# Patient Record
Sex: Female | Born: 1955
Health system: Southern US, Community
[De-identification: ages and names within clinical notes are randomized; demographics above are authoritative.]

## PROBLEM LIST (undated history)

## (undated) DIAGNOSIS — Z800 Family history of malignant neoplasm of digestive organs: Secondary | ICD-10-CM

## (undated) DIAGNOSIS — C189 Malignant neoplasm of colon, unspecified: Secondary | ICD-10-CM

## (undated) DIAGNOSIS — E119 Type 2 diabetes mellitus without complications: Secondary | ICD-10-CM

## (undated) DIAGNOSIS — R0683 Snoring: Secondary | ICD-10-CM

## (undated) DIAGNOSIS — I10 Essential (primary) hypertension: Secondary | ICD-10-CM

## (undated) DIAGNOSIS — C787 Secondary malignant neoplasm of liver and intrahepatic bile duct: Principal | ICD-10-CM

## (undated) DIAGNOSIS — E785 Hyperlipidemia, unspecified: Secondary | ICD-10-CM

## (undated) DIAGNOSIS — Z973 Presence of spectacles and contact lenses: Secondary | ICD-10-CM

## (undated) DIAGNOSIS — M549 Dorsalgia, unspecified: Secondary | ICD-10-CM

## (undated) HISTORY — DX: Family history of malignant neoplasm of digestive organs: Z80.0

## (undated) HISTORY — DX: Presence of spectacles and contact lenses: Z97.3

## (undated) HISTORY — DX: Secondary malignant neoplasm of liver and intrahepatic bile duct: C78.7

## (undated) HISTORY — DX: Snoring: R06.83

## (undated) HISTORY — PX: CATARACT EXTRACTION: SUR2

## (undated) HISTORY — PX: MOUTH SURGERY: SHX715

## (undated) HISTORY — DX: Dorsalgia, unspecified: M54.9

## (undated) HISTORY — DX: Malignant neoplasm of colon, unspecified: C18.9

---

## 2007-03-18 ENCOUNTER — Ambulatory Visit (HOSPITAL_COMMUNITY): Admission: RE | Admit: 2007-03-18 | Discharge: 2007-03-18 | Payer: Self-pay | Admitting: Ophthalmology

## 2008-05-18 ENCOUNTER — Ambulatory Visit (HOSPITAL_COMMUNITY): Admission: RE | Admit: 2008-05-18 | Discharge: 2008-05-18 | Payer: Self-pay | Admitting: Ophthalmology

## 2008-05-24 ENCOUNTER — Ambulatory Visit (HOSPITAL_COMMUNITY): Admission: RE | Admit: 2008-05-24 | Discharge: 2008-05-24 | Payer: Self-pay | Admitting: Ophthalmology

## 2010-04-30 LAB — GLUCOSE, CAPILLARY: Glucose-Capillary: 150 mg/dL — ABNORMAL HIGH (ref 70–99)

## 2010-05-01 LAB — GLUCOSE, CAPILLARY: Glucose-Capillary: 144 mg/dL — ABNORMAL HIGH (ref 70–99)

## 2010-05-01 LAB — HEMOGLOBIN AND HEMATOCRIT, BLOOD
HCT: 33.7 % — ABNORMAL LOW (ref 36.0–46.0)
Hemoglobin: 11.4 g/dL — ABNORMAL LOW (ref 12.0–15.0)

## 2010-05-01 LAB — BASIC METABOLIC PANEL
CO2: 32 mEq/L (ref 19–32)
Calcium: 9.4 mg/dL (ref 8.4–10.5)
Creatinine, Ser: 0.45 mg/dL (ref 0.4–1.2)
GFR calc Af Amer: >60 mL/min (ref >60)
GFR calc non Af Amer: >60 mL/min (ref >60)
Sodium: 139 mEq/L (ref 135–145)

## 2010-10-11 LAB — BASIC METABOLIC PANEL
BUN: 8
Calcium: 9.4
Creatinine, Ser: 0.5
GFR calc non Af Amer: >60
Glucose, Bld: 364 — ABNORMAL HIGH
Potassium: 4.3

## 2010-10-11 LAB — HCG, QUANTITATIVE, PREGNANCY: hCG, Beta Chain, Quant, S: <2

## 2014-05-21 ENCOUNTER — Emergency Department (HOSPITAL_COMMUNITY)
Admission: EM | Admit: 2014-05-21 | Discharge: 2014-05-21 | Disposition: A | Discharge status: Home / Self Care | Payer: BLUE CROSS/BLUE SHIELD | Attending: Emergency Medicine | Admitting: Emergency Medicine

## 2014-05-21 ENCOUNTER — Encounter (HOSPITAL_COMMUNITY): Payer: Self-pay | Admitting: *Deleted

## 2014-05-21 DIAGNOSIS — R519 Headache, unspecified: Secondary | ICD-10-CM

## 2014-05-21 DIAGNOSIS — R7401 Elevation of levels of liver transaminase levels: Secondary | ICD-10-CM

## 2014-05-21 DIAGNOSIS — Z79899 Other long term (current) drug therapy: Secondary | ICD-10-CM | POA: Insufficient documentation

## 2014-05-21 DIAGNOSIS — R112 Nausea with vomiting, unspecified: Secondary | ICD-10-CM | POA: Diagnosis not present

## 2014-05-21 DIAGNOSIS — D649 Anemia, unspecified: Secondary | ICD-10-CM | POA: Diagnosis not present

## 2014-05-21 DIAGNOSIS — R740 Nonspecific elevation of levels of transaminase and lactic acid dehydrogenase [LDH]: Secondary | ICD-10-CM | POA: Diagnosis not present

## 2014-05-21 DIAGNOSIS — Z794 Long term (current) use of insulin: Secondary | ICD-10-CM | POA: Insufficient documentation

## 2014-05-21 DIAGNOSIS — I10 Essential (primary) hypertension: Secondary | ICD-10-CM | POA: Insufficient documentation

## 2014-05-21 DIAGNOSIS — R509 Fever, unspecified: Secondary | ICD-10-CM | POA: Diagnosis not present

## 2014-05-21 DIAGNOSIS — E119 Type 2 diabetes mellitus without complications: Secondary | ICD-10-CM | POA: Insufficient documentation

## 2014-05-21 DIAGNOSIS — R51 Headache: Secondary | ICD-10-CM | POA: Insufficient documentation

## 2014-05-21 HISTORY — DX: Hyperlipidemia, unspecified: E78.5

## 2014-05-21 HISTORY — DX: Essential (primary) hypertension: I10

## 2014-05-21 HISTORY — DX: Type 2 diabetes mellitus without complications: E11.9

## 2014-05-21 LAB — COMPREHENSIVE METABOLIC PANEL
ALT: 50 U/L (ref 14–54)
ANION GAP: 8 (ref 5–15)
AST: 59 U/L — AB (ref 15–41)
Albumin: 4.2 g/dL (ref 3.5–5.0)
Alkaline Phosphatase: 107 U/L (ref 38–126)
BILIRUBIN TOTAL: 0.5 mg/dL (ref 0.3–1.2)
BUN: 17 mg/dL (ref 6–20)
CALCIUM: 9.2 mg/dL (ref 8.9–10.3)
CHLORIDE: 103 mmol/L (ref 101–111)
CO2: 26 mmol/L (ref 22–32)
CREATININE: 1.02 mg/dL — AB (ref 0.44–1.00)
GFR, EST NON AFRICAN AMERICAN: 58 mL/min — AB (ref >60)
Glucose, Bld: 116 mg/dL — ABNORMAL HIGH (ref 70–99)
Potassium: 4.2 mmol/L (ref 3.5–5.1)
Sodium: 137 mmol/L (ref 135–145)
Total Protein: 7.8 g/dL (ref 6.5–8.1)

## 2014-05-21 LAB — CBC WITH DIFFERENTIAL/PLATELET
BASOS PCT: 0 % (ref 0–1)
Basophils Absolute: 0 10*3/uL (ref 0.0–0.1)
EOS ABS: 0.1 10*3/uL (ref 0.0–0.7)
Eosinophils Relative: 1 % (ref 0–5)
HCT: 32 % — ABNORMAL LOW (ref 36.0–46.0)
Hemoglobin: 10.1 g/dL — ABNORMAL LOW (ref 12.0–15.0)
LYMPHS ABS: 1.4 10*3/uL (ref 0.7–4.0)
LYMPHS PCT: 12 % (ref 12–46)
MCH: 26.5 pg (ref 26.0–34.0)
MCHC: 31.6 g/dL (ref 30.0–36.0)
MCV: 84 fL (ref 78.0–100.0)
MONOS PCT: 9 % (ref 3–12)
Monocytes Absolute: 1 10*3/uL (ref 0.1–1.0)
NEUTROS ABS: 9.3 10*3/uL — AB (ref 1.7–7.7)
NEUTROS PCT: 78 % — AB (ref 43–77)
PLATELETS: 220 10*3/uL (ref 150–400)
RBC: 3.81 MIL/uL — AB (ref 3.87–5.11)
RDW: 14.6 % (ref 11.5–15.5)
WBC: 12 10*3/uL — AB (ref 4.0–10.5)

## 2014-05-21 LAB — LIPASE, BLOOD: Lipase: 16 U/L — ABNORMAL LOW (ref 22–51)

## 2014-05-21 MED ORDER — METOCLOPRAMIDE HCL 5 MG/ML IJ SOLN
10.0000 mg | Freq: Once | INTRAMUSCULAR | Status: AC
  Administered 2014-05-21: 10 mg via INTRAVENOUS
  Filled 2014-05-21: qty 2

## 2014-05-21 MED ORDER — DIPHENHYDRAMINE HCL 50 MG/ML IJ SOLN
25.0000 mg | Freq: Once | INTRAMUSCULAR | Status: AC
  Administered 2014-05-21: 25 mg via INTRAVENOUS
  Filled 2014-05-21: qty 1

## 2014-05-21 MED ORDER — SODIUM CHLORIDE 0.9 % IV SOLN
1000.0000 mL | Freq: Once | INTRAVENOUS | Status: AC
  Administered 2014-05-21: 1000 mL via INTRAVENOUS

## 2014-05-21 MED ORDER — SODIUM CHLORIDE 0.9 % IV SOLN: 1000.0000 mL | INTRAVENOUS | Status: DC

## 2014-05-21 MED ORDER — METOCLOPRAMIDE HCL 10 MG PO TABS: 10.0000 mg | ORAL_TABLET | Freq: Four times a day (QID) | ORAL | Status: DC | PRN

## 2014-05-21 MED ORDER — ONDANSETRON HCL 4 MG/2ML IJ SOLN
4.0000 mg | Freq: Once | INTRAMUSCULAR | Status: DC
  Administered 2014-05-21: 4 mg via INTRAVENOUS
  Filled 2014-05-21: qty 2

## 2014-05-21 MED ORDER — ACETAMINOPHEN 325 MG PO TABS
650.0000 mg | ORAL_TABLET | Freq: Once | ORAL | Status: AC
  Administered 2014-05-21: 650 mg via ORAL
  Filled 2014-05-21: qty 2

## 2014-05-21 NOTE — ED Provider Notes (Signed)
CSN: [id number]     Arrival date & time 05/21/14  0334 History   First MD Initiated Contact with Patient 05/21/14 [phone]     Chief Complaint  Patient presents with  . Emesis     (Consider location/radiation/quality/duration/timing/severity/associated sxs/prior Treatment) Patient is a 59 y.o. female presenting with vomiting. The history is provided by the patient.  Emesis She had onset yesterday of malaise, nausea, mild headache. Nausea got worse and she started vomiting tonight. She also states that her headache got worse tonight. She was not aware of any fever but has had some chills. There have not been any sweats. She has had some intermittent pain in her left upper abdomen. There's been no constipation or diarrhea. She denies urinary urgency, frequency, tenesmus, dysuria. She denies arthralgias or myalgias. She denies any sick contacts.  Past Medical History  Diagnosis Date  . Hyperlipemia   . Diabetes mellitus without complication   . Hypertension    History reviewed. No pertinent past surgical history. No family history on file. History  Substance Use Topics  . Smoking status: Never Smoker   . Smokeless tobacco: Not on file  . Alcohol Use: No   OB History    No data available     Review of Systems  Gastrointestinal: Positive for vomiting.  All other systems reviewed and are negative.     Allergies  Review of patient's allergies indicates no known allergies.  Home Medications   Prior to Admission medications   Medication Sig Start Date End Date Taking? Authorizing Provider  insulin glargine (LANTUS) 100 UNIT/ML injection Inject 28 Units into the skin at bedtime.   Yes Historical Provider, MD  insulin lispro (HUMALOG) 100 UNIT/ML injection Inject into the skin 2 (two) times daily. Sliding scale   Yes Historical Provider, MD  losartan (COZAAR) 50 MG tablet Take 50 mg by mouth daily.   Yes Historical Provider, MD  simvastatin (ZOCOR) 80 MG tablet Take 80 mg by mouth  daily.   Yes Historical Provider, MD   BP 163/94 mmHg  Pulse 111  Temp(Src) 101.2 F (38.4 C) (Oral)  Resp 20  Ht 5\' 4"  (1.626 m)  Wt 172 lb (78.019 kg)  BMI 29.51 kg/m2  SpO2 97% Physical Exam  Nursing note and vitals reviewed.  59 year old female, resting comfortably and in no acute distress. Vital signs are significant for fever, tachycardia, hypertension. Oxygen saturation is 97%, which is normal. Head is normocephalic and atraumatic. PERRLA, EOMI. Oropharynx is clear. Neck is nontender and supple without adenopathy or JVD. Back is nontender and there is no CVA tenderness. Lungs are clear without rales, wheezes, or rhonchi. Chest is nontender. Heart has regular rate and rhythm without murmur. Abdomen is soft, flat, nontender without masses or hepatosplenomegaly and peristalsis is hypoactive. Extremities have no cyanosis or edema, full range of motion is present. Skin is warm and dry without rash. Neurologic: Mental status is normal, cranial nerves are intact, there are no motor or sensory deficits.  ED Course  Procedures (including critical care time) Labs Review Results for orders placed or performed during the hospital encounter of 05/21/14  CBC with Differential  Result Value Ref Range   WBC 12.0 (H) 4.0 - 10.5 K/uL   RBC 3.81 (L) 3.87 - 5.11 MIL/uL   Hemoglobin 10.1 (L) 12.0 - 15.0 g/dL   HCT 32.0 (L) 36.0 - 46.0 %   MCV 84.0 78.0 - 100.0 fL   MCH 26.5 26.0 - 34.0 pg   MCHC  31.6 30.0 - 36.0 g/dL   RDW 14.6 11.5 - 15.5 %   Platelets 220 150 - 400 K/uL   Neutrophils Relative % 78 (H) 43 - 77 %   Neutro Abs 9.3 (H) 1.7 - 7.7 K/uL   Lymphocytes Relative 12 12 - 46 %   Lymphs Abs 1.4 0.7 - 4.0 K/uL   Monocytes Relative 9 3 - 12 %   Monocytes Absolute 1.0 0.1 - 1.0 K/uL   Eosinophils Relative 1 0 - 5 %   Eosinophils Absolute 0.1 0.0 - 0.7 K/uL   Basophils Relative 0 0 - 1 %   Basophils Absolute 0.0 0.0 - 0.1 K/uL  Comprehensive metabolic panel  Result Value Ref  Range   Sodium 137 135 - 145 mmol/L   Potassium 4.2 3.5 - 5.1 mmol/L   Chloride 103 101 - 111 mmol/L   CO2 26 22 - 32 mmol/L   Glucose, Bld 116 (H) 70 - 99 mg/dL   BUN 17 6 - 20 mg/dL   Creatinine, Ser 1.02 (H) 0.44 - 1.00 mg/dL   Calcium 9.2 8.9 - 10.3 mg/dL   Total Protein 7.8 6.5 - 8.1 g/dL   Albumin 4.2 3.5 - 5.0 g/dL   AST 59 (H) 15 - 41 U/L   ALT 50 14 - 54 U/L   Alkaline Phosphatase 107 38 - 126 U/L   Total Bilirubin 0.5 0.3 - 1.2 mg/dL   GFR calc non Af Amer 58 (L) >60 mL/min   GFR calc Af Amer >60 >60 mL/min   Anion gap 8 5 - 15  Lipase, blood  Result Value Ref Range   Lipase 16 (L) 22 - 51 U/L   MDM   Final diagnoses:  Nausea and vomiting, vomiting of unspecified type  Fever, unspecified fever cause  Headache, unspecified headache type  Normochromic normocytic anemia  Elevated transaminase level    Fever, headache, nausea and vomiting. This is most compatible with a viral illness. Symptomatic treatment is started with IV fluids, metoclopramide, diphenhydramine, and acetaminophen.  She feels much better after above noted treatment. Temperature is down to 99.0 and heart rate has come down. She is discharged with prescription for metoclopramide and told to use acetaminophen for fever. Laboratory workup was significant for mild anemia which is unchanged from baseline, and mild elevation of AST and 59. She is advised to have this repeated as an outpatient in one-2 weeks.  Delora Fuel, MD 40/97/35 [id number]

## 2014-05-21 NOTE — ED Notes (Signed)
Pt reports she has been feeling bad for a day or so & started vomiting last night.

## 2014-05-21 NOTE — Discharge Instructions (Signed)
Take acetaminophen as needed for fever.  One test of your liver was mildly elevated. AST was 59. Please see your doctor in 1-2 weeks to repeat this test.  Nausea and Vomiting Nausea is a sick feeling that often comes before throwing up (vomiting). Vomiting is a reflex where stomach contents come out of your mouth. Vomiting can cause severe loss of body fluids (dehydration). Children and elderly adults can become dehydrated quickly, especially if they also have diarrhea. Nausea and vomiting are symptoms of a condition or disease. It is important to find the cause of your symptoms. CAUSES   Direct irritation of the stomach lining. This irritation can result from increased acid production (gastroesophageal reflux disease), infection, food poisoning, taking certain medicines (such as nonsteroidal anti-inflammatory drugs), alcohol use, or tobacco use.  Signals from the brain.These signals could be caused by a headache, heat exposure, an inner ear disturbance, increased pressure in the brain from injury, infection, a tumor, or a concussion, pain, emotional stimulus, or metabolic problems.  An obstruction in the gastrointestinal tract (bowel obstruction).  Illnesses such as diabetes, hepatitis, gallbladder problems, appendicitis, kidney problems, cancer, sepsis, atypical symptoms of a heart attack, or eating disorders.  Medical treatments such as chemotherapy and radiation.  Receiving medicine that makes you sleep (general anesthetic) during surgery. DIAGNOSIS Your caregiver may ask for tests to be done if the problems do not improve after a few days. Tests may also be done if symptoms are severe or if the reason for the nausea and vomiting is not clear. Tests may include:  Urine tests.  Blood tests.  Stool tests.  Cultures (to look for evidence of infection).  X-rays or other imaging studies. Test results can help your caregiver make decisions about treatment or the need for additional  tests. TREATMENT You need to stay well hydrated. Drink frequently but in small amounts.You may wish to drink water, sports drinks, clear broth, or eat frozen ice pops or gelatin dessert to help stay hydrated.When you eat, eating slowly may help prevent nausea.There are also some antinausea medicines that may help prevent nausea. HOME CARE INSTRUCTIONS   Take all medicine as directed by your caregiver.  If you do not have an appetite, do not force yourself to eat. However, you must continue to drink fluids.  If you have an appetite, eat a normal diet unless your caregiver tells you differently.  Eat a variety of complex carbohydrates (rice, wheat, potatoes, bread), lean meats, yogurt, fruits, and vegetables.  Avoid high-fat foods because they are more difficult to digest.  Drink enough water and fluids to keep your urine clear or pale yellow.  If you are dehydrated, ask your caregiver for specific rehydration instructions. Signs of dehydration may include:  Severe thirst.  Dry lips and mouth.  Dizziness.  Dark urine.  Decreasing urine frequency and amount.  Confusion.  Rapid breathing or pulse. SEEK IMMEDIATE MEDICAL CARE IF:   You have blood or [place] flecks (like coffee grounds) in your vomit.  You have black or bloody stools.  You have a severe headache or stiff neck.  You are confused.  You have severe abdominal pain.  You have chest pain or trouble breathing.  You do not urinate at least once every 8 hours.  You develop cold or clammy skin.  You continue to vomit for longer than 24 to 48 hours.  You have a fever. MAKE SURE YOU:   Understand these instructions.  Will watch your condition.  Will get help right  away if you are not doing well or get worse. Document Released: 01/06/2005 Document Revised: 03/31/2011 Document Reviewed: 06/05/2010 [facility] Patient Information 2015 [place], [place]. This information is not intended to replace advice given to  you by your health care provider. Make sure you discuss any questions you have with your health care provider.  General Headache Without Cause A headache is pain or discomfort felt around the head or neck area. The specific cause of a headache may not be found. There are many causes and types of headaches. A few common ones are:  Tension headaches.  Migraine headaches.  Cluster headaches.  Chronic daily headaches. HOME CARE INSTRUCTIONS   Keep all follow-up appointments with your caregiver or any specialist referral.  Only take over-the-counter or prescription medicines for pain or discomfort as directed by your caregiver.  Lie down in a dark, quiet room when you have a headache.  Keep a headache journal to find out what may trigger your migraine headaches. For example, write down:  What you eat and drink.  How much sleep you get.  Any change to your diet or medicines.  Try massage or other relaxation techniques.  Put ice packs or heat on the head and neck. Use these 3 to 4 times per day for 15 to 20 minutes each time, or as needed.  Limit stress.  Sit up straight, and do not tense your muscles.  Quit smoking if you smoke.  Limit alcohol use.  Decrease the amount of caffeine you drink, or stop drinking caffeine.  Eat and sleep on a regular schedule.  Get 7 to 9 hours of sleep, or as recommended by your caregiver.  Keep lights dim if bright lights bother you and make your headaches worse. SEEK MEDICAL CARE IF:   You have problems with the medicines you were prescribed.  Your medicines are not working.  You have a change from the usual headache.  You have nausea or vomiting. SEEK IMMEDIATE MEDICAL CARE IF:   Your headache becomes severe.  You have a fever.  You have a stiff neck.  You have loss of vision.  You have muscular weakness or loss of muscle control.  You start losing your balance or have trouble walking.  You feel faint or pass out.  You  have severe symptoms that are different from your first symptoms. MAKE SURE YOU:   Understand these instructions.  Will watch your condition.  Will get help right away if you are not doing well or get worse. Document Released: 01/06/2005 Document Revised: 03/31/2011 Document Reviewed: 01/22/2011 [facility] Patient Information 2015 [place], [place]. This information is not intended to replace advice given to you by your health care provider. Make sure you discuss any questions you have with your health care provider.  Metoclopramide tablets What is this medicine? METOCLOPRAMIDE (met oh kloe PRA mide) is used to treat the symptoms of gastroesophageal reflux disease (GERD) like heartburn. It is also used to treat people with slow emptying of the stomach and intestinal tract. This medicine may be used for other purposes; ask your health care provider or pharmacist if you have questions. COMMON BRAND NAME(S): Reglan What should I tell my health care provider before I take this medicine? They need to know if you have any of these conditions: -breast cancer -depression -diabetes -heart failure -high blood pressure -kidney disease -liver disease -Parkinson's disease or a movement disorder -pheochromocytoma -seizures -stomach obstruction, bleeding, or perforation -an unusual or allergic reaction to metoclopramide, procainamide, sulfites, other  medicines, foods, dyes, or preservatives -pregnant or trying to get pregnant -breast-feeding How should I use this medicine? Take this medicine by mouth with a glass of water. Follow the directions on the prescription label. Take this medicine on an empty stomach, about 30 minutes before eating. Take your doses at regular intervals. Do not take your medicine more often than directed. Do not stop taking except on the advice of your doctor or health care professional. A special MedGuide will be given to you by the pharmacist with each prescription and  refill. Be sure to read this information carefully each time. Talk to your pediatrician regarding the use of this medicine in children. Special care may be needed. Overdosage: If you think you have taken too much of this medicine contact a poison control center or emergency room at once. NOTE: This medicine is only for you. Do not share this medicine with others. What if I miss a dose? If you miss a dose, take it as soon as you can. If it is almost time for your next dose, take only that dose. Do not take double or extra doses. What may interact with this medicine? -acetaminophen -cyclosporine -digoxin -medicines for blood pressure -medicines for diabetes, including insulin -medicines for hay fever and other allergies -medicines for depression, especially an Monoamine Oxidase Inhibitor (MAOI) -medicines for Parkinson's disease, like levodopa -medicines for sleep or for pain -tetracycline This list may not describe all possible interactions. Give your health care provider a list of all the medicines, herbs, non-prescription drugs, or dietary supplements you use. Also tell them if you smoke, drink alcohol, or use illegal drugs. Some items may interact with your medicine. What should I watch for while using this medicine? It may take a few weeks for your stomach condition to start to get better. However, do not take this medicine for longer than 12 weeks. The longer you take this medicine, and the more you take it, the greater your chances are of developing serious side effects. If you are an elderly patient, a female patient, or you have diabetes, you may be at an increased risk for side effects from this medicine. Contact your doctor immediately if you start having movements you cannot control such as lip smacking, rapid movements of the tongue, involuntary or uncontrollable movements of the eyes, head, arms and legs, or muscle twitches and spasms. Patients and their families should watch out for  worsening depression or thoughts of suicide. Also watch out for any sudden or severe changes in feelings such as feeling anxious, agitated, panicky, irritable, hostile, aggressive, impulsive, severely restless, overly excited and hyperactive, or not being able to sleep. If this happens, especially at the beginning of treatment or after a change in dose, call your doctor. Do not treat yourself for high fever. Ask your doctor or health care professional for advice. You may get drowsy or dizzy. Do not drive, use machinery, or do anything that needs mental alertness until you know how this drug affects you. Do not stand or sit up quickly, especially if you are an older patient. This reduces the risk of dizzy or fainting spells. Alcohol can make you more drowsy and dizzy. Avoid alcoholic drinks. What side effects may I notice from receiving this medicine? Side effects that you should report to your doctor or health care professional as soon as possible: -allergic reactions like skin rash, itching or hives, swelling of the face, lips, or tongue -abnormal production of milk in females -breast enlargement in  both males and females -change in the way you walk -difficulty moving, speaking or swallowing -drooling, lip smacking, or rapid movements of the tongue -excessive sweating -fever -involuntary or uncontrollable movements of the eyes, head, arms and legs -irregular heartbeat or palpitations -muscle twitches and spasms -unusually weak or tired Side effects that usually do not require medical attention (report to your doctor or health care professional if they continue or are bothersome): -change in sex drive or performance -depressed mood -diarrhea -difficulty sleeping -headache -menstrual changes -restless or nervous This list may not describe all possible side effects. Call your doctor for medical advice about side effects. You may report side effects to FDA at 1-800-FDA-1088. Where should I  keep my medicine? Keep out of the reach of children. Store at room temperature between 20 and 25 degrees C (68 and 77 degrees F). Protect from light. Keep container tightly closed. Throw away any unused medicine after the expiration date. NOTE: This sheet is a summary. It may not cover all possible information. If you have questions about this medicine, talk to your doctor, pharmacist, or health care provider.  2015, [employer]. (2011-05-06 13:04:38)

## 2014-07-13 ENCOUNTER — Encounter (HOSPITAL_COMMUNITY): Payer: Self-pay | Admitting: Emergency Medicine

## 2014-07-13 ENCOUNTER — Emergency Department (HOSPITAL_COMMUNITY)
Admission: EM | Admit: 2014-07-13 | Discharge: 2014-07-14 | Disposition: A | Discharge status: Home / Self Care | Payer: BLUE CROSS/BLUE SHIELD | Attending: Emergency Medicine | Admitting: Emergency Medicine

## 2014-07-13 DIAGNOSIS — C189 Malignant neoplasm of colon, unspecified: Secondary | ICD-10-CM | POA: Diagnosis not present

## 2014-07-13 DIAGNOSIS — Z79899 Other long term (current) drug therapy: Secondary | ICD-10-CM | POA: Insufficient documentation

## 2014-07-13 DIAGNOSIS — E119 Type 2 diabetes mellitus without complications: Secondary | ICD-10-CM | POA: Insufficient documentation

## 2014-07-13 DIAGNOSIS — I10 Essential (primary) hypertension: Secondary | ICD-10-CM | POA: Diagnosis not present

## 2014-07-13 DIAGNOSIS — Z794 Long term (current) use of insulin: Secondary | ICD-10-CM | POA: Insufficient documentation

## 2014-07-13 DIAGNOSIS — C787 Secondary malignant neoplasm of liver and intrahepatic bile duct: Secondary | ICD-10-CM | POA: Diagnosis not present

## 2014-07-13 DIAGNOSIS — R109 Unspecified abdominal pain: Secondary | ICD-10-CM | POA: Diagnosis present

## 2014-07-13 DIAGNOSIS — E785 Hyperlipidemia, unspecified: Secondary | ICD-10-CM | POA: Diagnosis not present

## 2014-07-13 LAB — CBC WITH DIFFERENTIAL/PLATELET
BASOS PCT: 0 % (ref 0–1)
Basophils Absolute: 0 10*3/uL (ref 0.0–0.1)
EOS ABS: 0.2 10*3/uL (ref 0.0–0.7)
EOS PCT: 2 % (ref 0–5)
HCT: 33.9 % — ABNORMAL LOW (ref 36.0–46.0)
Hemoglobin: 10.8 g/dL — ABNORMAL LOW (ref 12.0–15.0)
Lymphocytes Relative: 14 % (ref 12–46)
Lymphs Abs: 1.4 10*3/uL (ref 0.7–4.0)
MCH: 25.9 pg — AB (ref 26.0–34.0)
MCHC: 31.9 g/dL (ref 30.0–36.0)
MCV: 81.3 fL (ref 78.0–100.0)
MONOS PCT: 11 % (ref 3–12)
Monocytes Absolute: 1.1 10*3/uL — ABNORMAL HIGH (ref 0.1–1.0)
NEUTROS PCT: 73 % (ref 43–77)
Neutro Abs: 7.1 10*3/uL (ref 1.7–7.7)
PLATELETS: 245 10*3/uL (ref 150–400)
RBC: 4.17 MIL/uL (ref 3.87–5.11)
RDW: 14.6 % (ref 11.5–15.5)
WBC: 9.8 10*3/uL (ref 4.0–10.5)

## 2014-07-13 LAB — URINALYSIS, ROUTINE W REFLEX MICROSCOPIC
BILIRUBIN URINE: NEGATIVE
Glucose, UA: NEGATIVE mg/dL
Hgb urine dipstick: NEGATIVE
Ketones, ur: NEGATIVE mg/dL
NITRITE: NEGATIVE
PH: 5 (ref 5.0–8.0)
Protein, ur: NEGATIVE mg/dL
SPECIFIC GRAVITY, URINE: 1.012 (ref 1.005–1.030)
UROBILINOGEN UA: 1 mg/dL (ref 0.0–1.0)

## 2014-07-13 LAB — I-STAT CHEM 8, ED
BUN: 19 mg/dL (ref 6–20)
CALCIUM ION: 1.04 mmol/L — AB (ref 1.13–1.30)
CHLORIDE: 100 mmol/L — AB (ref 101–111)
Creatinine, Ser: 1 mg/dL (ref 0.44–1.00)
Glucose, Bld: 94 mg/dL (ref 65–99)
HCT: 38 % (ref 36.0–46.0)
Hemoglobin: 12.9 g/dL (ref 12.0–15.0)
Potassium: 4.2 mmol/L (ref 3.5–5.1)
Sodium: 134 mmol/L — ABNORMAL LOW (ref 135–145)
TCO2: 23 mmol/L (ref 0–100)

## 2014-07-13 LAB — URINE MICROSCOPIC-ADD ON

## 2014-07-13 LAB — CBG MONITORING, ED: Glucose-Capillary: 92 mg/dL (ref 65–99)

## 2014-07-13 MED ORDER — ONDANSETRON HCL 4 MG/2ML IJ SOLN
4.0000 mg | Freq: Once | INTRAMUSCULAR | Status: AC
  Administered 2014-07-14: 4 mg via INTRAVENOUS
  Filled 2014-07-13: qty 2

## 2014-07-13 MED ORDER — SODIUM CHLORIDE 0.9 % IV BOLUS (SEPSIS)
1000.0000 mL | Freq: Once | INTRAVENOUS | Status: AC
  Administered 2014-07-14: 1000 mL via INTRAVENOUS

## 2014-07-13 NOTE — ED Notes (Signed)
Pt c/o lower abd pain with nausea and vomiting.  Denies diarrhea.  St's last BM last night

## 2014-07-14 ENCOUNTER — Emergency Department (HOSPITAL_COMMUNITY): Payer: BLUE CROSS/BLUE SHIELD

## 2014-07-14 ENCOUNTER — Encounter (HOSPITAL_COMMUNITY): Payer: Self-pay | Admitting: Radiology

## 2014-07-14 LAB — HEPATIC FUNCTION PANEL
ALK PHOS: 146 U/L — AB (ref 38–126)
ALT: 54 U/L (ref 14–54)
AST: 65 U/L — ABNORMAL HIGH (ref 15–41)
Albumin: 3.8 g/dL (ref 3.5–5.0)
BILIRUBIN DIRECT: 0.2 mg/dL (ref 0.1–0.5)
BILIRUBIN INDIRECT: 0.7 mg/dL (ref 0.3–0.9)
BILIRUBIN TOTAL: 0.9 mg/dL (ref 0.3–1.2)
Total Protein: 7.6 g/dL (ref 6.5–8.1)

## 2014-07-14 LAB — LIPASE, BLOOD: Lipase: 13 U/L — ABNORMAL LOW (ref 22–51)

## 2014-07-14 MED ORDER — IOHEXOL 300 MG/ML  SOLN
100.0000 mL | Freq: Once | INTRAMUSCULAR | Status: AC | PRN
  Administered 2014-07-14: 100 mL via INTRAVENOUS

## 2014-07-14 MED ORDER — IOHEXOL 300 MG/ML  SOLN
25.0000 mL | Freq: Once | INTRAMUSCULAR | Status: AC | PRN
  Administered 2014-07-14: 01:00:00 via INTRAVENOUS

## 2014-07-14 MED ORDER — ONDANSETRON 8 MG PO TBDP: 8.0000 mg | ORAL_TABLET | Freq: Three times a day (TID) | ORAL | Status: DC | PRN

## 2014-07-14 NOTE — ED Notes (Signed)
Lab called, they are adding on labs ordered.

## 2014-07-14 NOTE — Discharge Instructions (Signed)
Call [facility] to be seen today.   Colorectal Cancer Colorectal cancer is an abnormal growth of tissue (tumor) in the colon or rectum that is cancerous (malignant). Unlike noncancerous (benign) tumors, malignant tumors can spread to other parts of your body. The colon is the large bowel or large intestine. The rectum is the last several inches of the colon.  RISK FACTORS The exact cause of colorectal cancer is unknown. However, the following factors may increase your chances of getting colorectal cancer:   Age older than 30 years.   Abnormal growths (polyps) on the inner wall of the colon or rectum.   Diabetes.   African American race.   Family history of hereditary nonpolyposis colorectal cancer. This condition is caused by changes in the genes that are responsible for repairing mismatched DNA.   Personal history of cancer. A person who has already had colorectal cancer may develop it a second time. Also, women with a history of ovarian, uterine, or breast cancer are at a somewhat higher risk of developing colorectal cancer.  Certain hereditary conditions.  Eating a diet that is high in fat (especially animal fat) and low in fiber, fruits, and vegetables.  Sedentary lifestyle.  Inflammatory bowel disease, including ulcerative colitis and Crohn's disease.   Smoking.   Excessive alcohol use.  SYMPTOMS Early colorectal cancer often does not cause symptoms. As the cancer grows, symptoms may include:   Changes in bowel habits.  Diarrhea.   Constipation.   Feeling like the bowel does not empty completely after a bowel movement.   Blood in the stool.   Stools that are narrower than usual.   Abdominal discomfort, pain, bloating, fullness, or cramps.  Frequent gas pain.   Unexplained weight loss.   Constant tiredness.   Nausea and vomiting.  DIAGNOSIS  Your health care provider will ask about your medical history. He or she may also  perform a number of procedures, such as:   A physical exam.  A digital rectal exam.  A fecal occult blood test.  A barium enema.  Blood tests.   X-rays.   Imaging tests, such as CT scans or MRIs.   Taking a tissue sample (biopsy) from your colon or rectum to look for cancer cells.   A sigmoidoscopy to view the inside of the last part of your colon.   A colonoscopy to view the inside of your entire colon.   An endorectal ultrasound to see how deep a rectal tumor has grown and whether the cancer has spread to lymph nodes or other nearby tissues.  Your cancer will be staged to determine its severity and extent. Staging is a careful attempt to find out the size of the tumor, whether the cancer has spread, and if so, to what parts of the body. You may need to have more tests to determine the stage of your cancer. The test results will help determine what treatment plan is best for you.   Stage 0. The cancer is found only in the innermost lining of the colon or rectum.   Stage I. The cancer has grown into the inner wall of the colon or rectum. The cancer has not yet reached the outer wall of the colon.   Stage II. The cancer extends more deeply into or through the wall of the colon or rectum. It may have invaded nearby tissue, but cancer cells have not spread to the lymph nodes.   Stage III. The cancer has spread to nearby lymph  nodes but not to other parts of the body.   Stage IV. The cancer has spread to other parts of the body, such as the liver or lungs.  Your health care provider may tell you the detailed stage of your cancer, which includes both a number and a letter.  TREATMENT  Depending on the type and stage, colorectal cancer may be treated with surgery, radiation therapy, chemotherapy, targeted therapy, or radiofrequency ablation. Some people have a combination of these therapies. Surgery may be done to remove the polyps from your colon. In early stages, your  health care provider may be able to do this during a colonoscopy. In later stages, surgery may be done to remove part of your colon.  HOME CARE INSTRUCTIONS   Take medicines only as directed by your health care provider.   Maintain a healthy diet.   Consider joining a support group. This may help you learn to cope with the stress of having colorectal cancer.   Seek advice to help you manage treatment of side effects.   Keep all follow-up visits as directed by your health care provider.   Inform your cancer specialist if you are admitted to the hospital.  SEEK MEDICAL CARE IF:  Your diarrhea or constipation does not go away.   Your bowel habits change.  You have increased abdominal pain.   You notice new fatigue or weakness.  You lose weight. Document Released: 01/06/2005 Document Revised: 05/23/2013 Document Reviewed: 07/01/2012 [facility] Patient Information 2015 [place], [place]. This information is not intended to replace advice given to you by your health care provider. Make sure you discuss any questions you have with your health care provider.

## 2014-07-14 NOTE — ED Provider Notes (Signed)
CSN: [id number]     Arrival date & time 07/13/14  2030 History   First MD Initiated Contact with Patient 07/13/14 2348     Chief Complaint  Patient presents with  . Abdominal Pain     (Consider location/radiation/quality/duration/timing/severity/associated sxs/prior Treatment) Patient is a 59 y.o. female presenting with abdominal pain. The history is provided by the patient.  Abdominal Pain Associated symptoms: fatigue, nausea and vomiting   Associated symptoms: no chest pain, no diarrhea and no shortness of breath    patient presents with nausea vomiting abdominal pain. Has had some for the last 6 months. States she's been diagnosed with a stomach bug with extremity something is going on. The last 2 days has been more severe. Is on the left side or abdomen. It is dull. Is worse with eating. States she has had a good appetite, but she's been eating less because she knows that anything she eats she is going to throw up. No fevers or chills. States she thinks there is something more severe going on because it is been going more frequently over the last 6 months.  Past Medical History  Diagnosis Date  . Hyperlipemia   . Diabetes mellitus without complication   . Hypertension    History reviewed. No pertinent past surgical history. No family history on file. History  Substance Use Topics  . Smoking status: Never Smoker   . Smokeless tobacco: Not on file  . Alcohol Use: No   OB History    No data available     Review of Systems  Constitutional: Positive for fatigue. Negative for activity change and appetite change.  Eyes: Negative for pain.  Respiratory: Negative for chest tightness and shortness of breath.   Cardiovascular: Negative for chest pain and leg swelling.  Gastrointestinal: Positive for nausea, vomiting and abdominal pain. Negative for diarrhea.  Genitourinary: Negative for flank pain.  Musculoskeletal: Negative for back pain and neck stiffness.  Skin: Negative for  rash.  Neurological: Negative for weakness, numbness and headaches.  Psychiatric/Behavioral: Negative for behavioral problems.      Allergies  Review of patient's allergies indicates no known allergies.  Home Medications   Prior to Admission medications   Medication Sig Start Date End Date Taking? Authorizing Provider  insulin glargine (LANTUS) 100 UNIT/ML injection Inject 28 Units into the skin at bedtime.    Historical Provider, MD  insulin lispro (HUMALOG) 100 UNIT/ML injection Inject into the skin 2 (two) times daily. Sliding scale    Historical Provider, MD  losartan (COZAAR) 50 MG tablet Take 50 mg by mouth daily.    Historical Provider, MD  metoCLOPramide (REGLAN) 10 MG tablet Take 1 tablet (10 mg total) by mouth every 6 (six) hours as needed for nausea (or headache). 04/21/68   Delora Fuel, MD  ondansetron (ZOFRAN-ODT) 8 MG disintegrating tablet Take 1 tablet (8 mg total) by mouth every 8 (eight) hours as needed for nausea or vomiting. 07/14/14   Davonna Belling, MD  simvastatin (ZOCOR) 80 MG tablet Take 80 mg by mouth daily.    Historical Provider, MD   BP 150/67 mmHg  Pulse 84  Temp(Src) 98.7 F (37.1 C) (Oral)  Resp 16  Ht 5\' 4"  (1.626 m)  Wt 156 lb 6.4 oz (70.943 kg)  BMI 26.83 kg/m2  SpO2 100% Physical Exam  Constitutional: She is oriented to person, place, and time. She appears well-developed and well-nourished.  HENT:  Head: Normocephalic and atraumatic.  Eyes: EOM are normal. Pupils are equal, round,  and reactive to light.  Neck: Normal range of motion.  Cardiovascular: Normal rate, regular rhythm and normal heart sounds.   No murmur heard. Pulmonary/Chest: Effort normal and breath sounds normal. No respiratory distress. She has no wheezes. She has no rales.  Abdominal: Soft. She exhibits no distension. There is tenderness. There is no rebound and no guarding.  Tenderness to left upper and left mid abdomen. Some less severe tenderness in left lower abdomen. No  hernias. No rebound or guarding.  Musculoskeletal: Normal range of motion.  Neurological: She is alert and oriented to person, place, and time. No cranial nerve deficit.  Skin: Skin is warm and dry.  Psychiatric: She has a normal mood and affect. Her speech is normal.  Nursing note and vitals reviewed.   ED Course  Procedures (including critical care time) Labs Review Labs Reviewed  CBC WITH DIFFERENTIAL/PLATELET - Abnormal; Notable for the following:    Hemoglobin 10.8 (*)    HCT 33.9 (*)    MCH 25.9 (*)    Monocytes Absolute 1.1 (*)    All other components within normal limits  URINALYSIS, ROUTINE W REFLEX MICROSCOPIC (NOT AT [facility]) - Abnormal; Notable for the following:    Leukocytes, UA MODERATE (*)    All other components within normal limits  URINE MICROSCOPIC-ADD ON - Abnormal; Notable for the following:    Squamous Epithelial / LPF MANY (*)    Bacteria, UA FEW (*)    All other components within normal limits  HEPATIC FUNCTION PANEL - Abnormal; Notable for the following:    AST 65 (*)    Alkaline Phosphatase 146 (*)    All other components within normal limits  LIPASE, BLOOD - Abnormal; Notable for the following:    Lipase 13 (*)    All other components within normal limits  I-STAT CHEM 8, ED - Abnormal; Notable for the following:    Sodium 134 (*)    Chloride 100 (*)    Calcium, Ion 1.04 (*)    All other components within normal limits  CBG MONITORING, ED    Imaging Review Ct Abdomen Pelvis W Contrast  07/14/2014   CLINICAL DATA:  LEFT sided abdominal pain and vomiting for several months. History of hypertension and diabetes.  EXAM: CT ABDOMEN AND PELVIS WITH CONTRAST  TECHNIQUE: Multidetector CT imaging of the abdomen and pelvis was performed using the standard protocol following bolus administration of intravenous contrast.  CONTRAST:  135mL OMNIPAQUE IOHEXOL 300 MG/ML  SOLN  COMPARISON:  None.  FINDINGS: LUNG BASES: Included view of the lung bases demonstrate  dependent atelectasis. Visualized heart size is normal, aortic annular calcifications. No pericardial fluid collections. Mild gas distended distal esophagus is associated with reflux disease.  SOLID ORGANS: Diffuse hypo enhancing hepatic mass with target sign, largest in RIGHT lobe measuring 6.3 x 5.2 cm. Spleen, pancreas and adrenal glands are unremarkable. 2 cm gallstone without CT findings of acute cholecystitis.  GASTROINTESTINAL TRACT: 6 cm segment of circumferential sigmoid wall thickening, 14 x 12 mm extending into the LEFT pelvis. The stomach, small bowel are normal in course and caliber without inflammatory changes. Enteric contrast has not yet reached the distal small bowel. Normal appendix.  KIDNEYS/ URINARY TRACT: Kidneys are orthotopic, demonstrating symmetric enhancement. No nephrolithiasis, hydronephrosis or solid renal masses. The unopacified ureters are normal in course and caliber. Delayed imaging through the kidneys demonstrates symmetric prompt contrast excretion within the proximal urinary collecting system. Urinary bladder is partially distended with mild circumferential wall thickening.  PERITONEUM/RETROPERITONEUM: Aortoiliac vessels are normal in course and caliber, mild calcific atherosclerosis. No lymphadenopathy by CT size criteria. Internal reproductive organs are unremarkable. No intraperitoneal free fluid nor free air.  SOFT TISSUE/OSSEOUS STRUCTURES: Non-suspicious. Small fat containing inguinal hernias. Grade 1 L4-5 anterolisthesis associated with severe facet arthropathy resulting in severe RIGHT L4-5 neural foraminal narrowing. No destructive bony lesions. No pars interarticularis defects.  IMPRESSION: 6 cm segment of sigmoid bowel wall thickening concerning for neoplasm with adjacent 14 x 12 mm nodule which may reflect direct tumoral extension or nodal metastasis. Diffuse hepatic metastasis. No bowel obstruction. Recommend follow-up sigmoidoscopy/colonoscopy on a nonemergent basis.   Mild bladder wall thickening could reflect underdistention or cystitis.  Acute findings discussed with and reconfirmed by Dr.Jaislyn Blinn on 07/14/2014 at 1:36 am.   Electronically Signed   By: Elon Alas M.D.   On: 07/14/2014 01:36     EKG Interpretation None      MDM   Final diagnoses:  Metastatic colon cancer to liver    Patient with nausea and vomiting. Metastatic colon cancer to liver found on CT. Labs reassuring. Some irritation of bladder. Culture sent. Patient does not want to be admitted. She has tolerated orals. Will d/c to follow with her PCP later today.     Davonna Belling, MD 07/14/14 0157

## 2014-07-15 LAB — URINE CULTURE

## 2014-07-17 ENCOUNTER — Encounter: Payer: Self-pay | Admitting: Internal Medicine

## 2014-07-18 ENCOUNTER — Telehealth: Payer: Self-pay | Admitting: *Deleted

## 2014-07-18 NOTE — Telephone Encounter (Signed)
-----   Message from Lafayette Dragon, MD sent at 07/17/2014  9:27 PM EDT ----- Regarding: Michelle Erickson, please call this pt- she is a referral from Dr Edrick Oh- she has a colon mass on CT scam, needs diagnosis ASAP. If I can see her today, we can colon on Wed 07/20/2014. Thanx

## 2014-07-18 NOTE — Telephone Encounter (Signed)
Patient cannot come today for OV. She is scheduled on 07/21/14 and will come then.

## 2014-07-19 ENCOUNTER — Telehealth: Payer: Self-pay | Admitting: Hematology

## 2014-07-19 NOTE — Telephone Encounter (Signed)
[facility]-S/W PATIENT HUSBAND JOHNNIE AND GAVE APPT FOR 07/01 @ 8:30 W/FENG

## 2014-07-21 ENCOUNTER — Other Ambulatory Visit: Payer: Self-pay | Admitting: General Surgery

## 2014-07-21 ENCOUNTER — Encounter: Payer: Self-pay | Admitting: Hematology

## 2014-07-21 ENCOUNTER — Ambulatory Visit (INDEPENDENT_AMBULATORY_CARE_PROVIDER_SITE_OTHER): Payer: BLUE CROSS/BLUE SHIELD | Admitting: Internal Medicine

## 2014-07-21 ENCOUNTER — Ambulatory Visit: Payer: BLUE CROSS/BLUE SHIELD | Attending: Hematology | Admitting: Physical Therapy

## 2014-07-21 ENCOUNTER — Ambulatory Visit (HOSPITAL_BASED_OUTPATIENT_CLINIC_OR_DEPARTMENT_OTHER): Payer: BLUE CROSS/BLUE SHIELD

## 2014-07-21 ENCOUNTER — Encounter: Payer: Self-pay | Admitting: *Deleted

## 2014-07-21 ENCOUNTER — Encounter: Payer: Self-pay | Admitting: Physical Therapy

## 2014-07-21 ENCOUNTER — Ambulatory Visit: Payer: BLUE CROSS/BLUE SHIELD | Admitting: Nutrition

## 2014-07-21 ENCOUNTER — Encounter: Payer: Self-pay | Admitting: Internal Medicine

## 2014-07-21 ENCOUNTER — Telehealth: Payer: Self-pay | Admitting: Hematology

## 2014-07-21 ENCOUNTER — Ambulatory Visit (HOSPITAL_BASED_OUTPATIENT_CLINIC_OR_DEPARTMENT_OTHER): Payer: BLUE CROSS/BLUE SHIELD | Admitting: Hematology

## 2014-07-21 VITALS — BP 100/62 | HR 82 | Ht 64.0 in | Wt 157.4 lb

## 2014-07-21 VITALS — BP 143/73 | HR 100 | Temp 98.5°F | Resp 18 | Ht 64.0 in | Wt 156.0 lb

## 2014-07-21 DIAGNOSIS — R160 Hepatomegaly, not elsewhere classified: Secondary | ICD-10-CM | POA: Diagnosis not present

## 2014-07-21 DIAGNOSIS — R531 Weakness: Secondary | ICD-10-CM | POA: Diagnosis not present

## 2014-07-21 DIAGNOSIS — R195 Other fecal abnormalities: Secondary | ICD-10-CM | POA: Diagnosis not present

## 2014-07-21 DIAGNOSIS — D649 Anemia, unspecified: Secondary | ICD-10-CM

## 2014-07-21 DIAGNOSIS — C787 Secondary malignant neoplasm of liver and intrahepatic bile duct: Secondary | ICD-10-CM | POA: Diagnosis not present

## 2014-07-21 DIAGNOSIS — R935 Abnormal findings on diagnostic imaging of other abdominal regions, including retroperitoneum: Secondary | ICD-10-CM

## 2014-07-21 DIAGNOSIS — C187 Malignant neoplasm of sigmoid colon: Secondary | ICD-10-CM | POA: Diagnosis not present

## 2014-07-21 DIAGNOSIS — C189 Malignant neoplasm of colon, unspecified: Secondary | ICD-10-CM

## 2014-07-21 HISTORY — DX: Malignant neoplasm of colon, unspecified: C78.7

## 2014-07-21 HISTORY — DX: Malignant neoplasm of colon, unspecified: C18.9

## 2014-07-21 LAB — COMPREHENSIVE METABOLIC PANEL (CC13)
ALT: 56 U/L — ABNORMAL HIGH (ref 0–55)
AST: 64 U/L — ABNORMAL HIGH (ref 5–34)
Albumin: 3.8 g/dL (ref 3.5–5.0)
Alkaline Phosphatase: 179 U/L — ABNORMAL HIGH (ref 40–150)
Anion Gap: 11 mEq/L (ref 3–11)
BUN: 14.8 mg/dL (ref 7.0–26.0)
CALCIUM: 10.1 mg/dL (ref 8.4–10.4)
CO2: 25 mEq/L (ref 22–29)
Chloride: 105 mEq/L (ref 98–109)
Creatinine: 1.1 mg/dL (ref 0.6–1.1)
EGFR: 61 mL/min/{1.73_m2} — AB (ref >90)
Glucose: 99 mg/dl (ref 70–140)
Potassium: 4.5 mEq/L (ref 3.5–5.1)
SODIUM: 141 meq/L (ref 136–145)
Total Bilirubin: 0.36 mg/dL (ref 0.20–1.20)
Total Protein: 7.7 g/dL (ref 6.4–8.3)

## 2014-07-21 LAB — IRON AND TIBC CHCC
%SAT: 12 % — AB (ref 21–57)
Iron: 34 ug/dL — ABNORMAL LOW (ref 41–142)
TIBC: 291 ug/dL (ref 236–444)
UIBC: 258 ug/dL (ref 120–384)

## 2014-07-21 LAB — CBC WITH DIFFERENTIAL/PLATELET
BASO%: 0.8 % (ref 0.0–2.0)
Basophils Absolute: 0.1 10*3/uL (ref 0.0–0.1)
EOS%: 3.5 % (ref 0.0–7.0)
Eosinophils Absolute: 0.3 10*3/uL (ref 0.0–0.5)
HCT: 33.9 % — ABNORMAL LOW (ref 34.8–46.6)
HEMOGLOBIN: 10.8 g/dL — AB (ref 11.6–15.9)
LYMPH#: 1.1 10*3/uL (ref 0.9–3.3)
LYMPH%: 12.6 % — ABNORMAL LOW (ref 14.0–49.7)
MCH: 26 pg (ref 25.1–34.0)
MCHC: 31.8 g/dL (ref 31.5–36.0)
MCV: 81.9 fL (ref 79.5–101.0)
MONO#: 0.6 10*3/uL (ref 0.1–0.9)
MONO%: 6.6 % (ref 0.0–14.0)
NEUT%: 76.5 % (ref 38.4–76.8)
NEUTROS ABS: 6.9 10*3/uL — AB (ref 1.5–6.5)
PLATELETS: 294 10*3/uL (ref 145–400)
RBC: 4.14 10*6/uL (ref 3.70–5.45)
RDW: 15.3 % — AB (ref 11.2–14.5)
WBC: 9 10*3/uL (ref 3.9–10.3)

## 2014-07-21 LAB — PROTIME-INR
INR: 1 — ABNORMAL LOW (ref 2.00–3.50)
PROTIME: 12 s (ref 10.6–13.4)

## 2014-07-21 LAB — FERRITIN CHCC: Ferritin: 112 ng/ml (ref 9–269)

## 2014-07-21 NOTE — H&P (Signed)
Michelle Erickson 07/21/2014 10:12 AM Location: [facility] Patient #: [id number] DOB: Oct 09, 1955 Undefined / Language: [name] / Race: Undefined Female  History of Present Illness Stark Klein MD; 07/21/2014 10:55 AM) The patient is a 59 year old female who presents with colorectal cancer. Patient is a 59 year old female referred by Dr. Alvino Chapel from the emergency department for a new presumed diagnosis of metastatic colon cancer. She presented to the emergency department with nausea vomiting and fatigue. A CT scan was performed and demonstrated thickening in the sigmoid colon as well as diffuse hepatic metastases. She described the pain as going on for 6 months. She lost between 15 and 20 pounds. Pain is described as dull and in the left upper quadrant. She had postprandial nausea and vomiting. Nothing that she took from home such as Tylenol helped the pain. She was also noted to be anemic in the emergency department and had elevated LFTs. Patient states that since she was given Zofran in the emergency department along with a prescription for this, she has been doing much better in terms of her symptoms. She denies any melena. She has not ever had a colonoscopy. She has had 2 sisters that died from colon cancer. She denies any other extended family with malignancy.   Other Problems Stark Klein, MD; 07/21/2014 10:55 AM) Back Pain Bladder Problems Diabetes Mellitus High blood pressure Hypercholesterolemia  Past Surgical History Stark Klein, MD; 07/21/2014 10:55 AM) Cataract Surgery Bilateral. Oral Surgery  Diagnostic Studies History Stark Klein, MD; 07/21/2014 10:55 AM) Colonoscopy never Mammogram >3 years ago Pap Smear 1-5 years ago  Social History Stark Klein, MD; 07/21/2014 10:55 AM) No alcohol use No caffeine use No drug use Tobacco use Former smoker.  Family History Stark Klein, MD; 07/21/2014 10:55 AM) Arthritis Sister. Breast Cancer Family Members In  General. Colon Cancer Sister. Colon Polyps Sister. Depression Sister. Diabetes Mellitus Sister. Respiratory Condition Father. Seizure disorder Brother.  Pregnancy / Birth History Stark Klein, MD; 07/21/2014 10:55 AM) Age at menarche 36 years. Age of menopause 51-55 Contraceptive History Oral contraceptives. Gravida 2 Irregular periods Maternal age 38-20 Para 2  Review of Systems Stark Klein MD; 07/21/2014 10:55 AM) General Present- Weight Loss. Not Present- Appetite Loss, Chills, Fatigue, Fever, Night Sweats and Weight Gain. Skin Not Present- Change in Wart/Mole, Dryness, Hives, Jaundice, New Lesions, Non-Healing Wounds, Rash and Ulcer. HEENT Present- Wears glasses/contact lenses. Not Present- Earache, Hearing Loss, Hoarseness, Nose Bleed, Oral Ulcers, Ringing in the Ears, Seasonal Allergies, Sinus Pain, Sore Throat, Visual Disturbances and Yellow Eyes. Respiratory Present- Chronic Cough and Snoring. Not Present- Bloody sputum, Difficulty Breathing and Wheezing. Breast Not Present- Breast Mass, Breast Pain, Nipple Discharge and Skin Changes. Cardiovascular Not Present- Chest Pain, Difficulty Breathing Lying Down, Leg Cramps, Palpitations, Rapid Heart Rate, Shortness of Breath and Swelling of Extremities. Gastrointestinal Present- Abdominal Pain and Nausea. Not Present- Bloating, Bloody Stool, Change in Bowel Habits, Chronic diarrhea, Constipation, Difficulty Swallowing, Excessive gas, Gets full quickly at meals, Hemorrhoids, Indigestion, Rectal Pain and Vomiting. Female Genitourinary Not Present- Frequency, Nocturia, Painful Urination, Pelvic Pain and Urgency. Musculoskeletal Not Present- Back Pain, Joint Pain, Joint Stiffness, Muscle Pain, Muscle Weakness and Swelling of Extremities. Neurological Not Present- Decreased Memory, Fainting, Headaches, Numbness, Seizures, Tingling, Tremor, Trouble walking and Weakness. Psychiatric Not Present- Anxiety, Bipolar, Change in Sleep  Pattern, Depression, Fearful and Frequent crying. Endocrine Present- Hot flashes. Not Present- Cold Intolerance, Excessive Hunger, Hair Changes, Heat Intolerance and New Diabetes. Hematology Not Present- Easy Bruising, Excessive bleeding, Gland problems,  HIV and Persistent Infections.   Physical Exam Stark Klein MD; 07/21/2014 10:55 AM) General Mental Status-Alert. General Appearance-Consistent with stated age. Hydration-Well hydrated. Voice-Normal.  Head and Neck Head-normocephalic, atraumatic with no lesions or palpable masses. Trachea-midline. Thyroid Gland Characteristics - normal size and consistency.  Eye Eyeball - Bilateral-Extraocular movements intact. Sclera/Conjunctiva - Bilateral-No scleral icterus.  Chest and Lung Exam Chest and lung exam reveals -quiet, even and easy respiratory effort with no use of accessory muscles and on auscultation, normal breath sounds, no adventitious sounds and normal vocal resonance. Inspection Chest Wall - Normal. Back - normal.  Cardiovascular Cardiovascular examination reveals -normal heart sounds, regular rate and rhythm with no murmurs and normal pedal pulses bilaterally.  Abdomen Inspection Inspection of the abdomen reveals - No Hernias. Palpation/Percussion Palpation and Percussion of the abdomen reveal - Soft, Non Tender, No Rebound tenderness, No Rigidity (guarding) and No hepatosplenomegaly. Auscultation Auscultation of the abdomen reveals - Bowel sounds normal.  Neurologic Neurologic evaluation reveals -alert and oriented x 3 with no impairment of recent or remote memory. Mental Status-Normal.  Musculoskeletal Global Assessment -Note: no gross deformities.  Normal Exam - Left-Upper Extremity Strength Normal and Lower Extremity Strength Normal. Normal Exam - Right-Upper Extremity Strength Normal and Lower Extremity Strength Normal.  Lymphatic Head & Neck  General Head & Neck Lymphatics:  Bilateral - Description - Normal. Axillary  General Axillary Region: Bilateral - Description - Normal. Tenderness - Non Tender. Femoral & Inguinal  Generalized Femoral & Inguinal Lymphatics: Bilateral - Description - No Generalized lymphadenopathy.    Assessment & Plan Stark Klein MD; 07/21/2014 11:00 AM) METASTATIC COLON CANCER TO LIVER (153.9  C18.9) Impression: The patient was seen in conjunction with medical oncology.  The patient is going to get a CT scan of the chest for complete staging. Also, a liver biopsy has been ordered. We will get a CEA level.  I reviewed Port-A-Cath placement with the patient. I discussed the procedure as well as risks and benefits. I discussed the risk of bleeding, infection, malposition, malfunction, possible need for additional surgery, possible need for a chest tube for a pneumothorax.  I discussed that at this juncture, we do not know how she would respond to chemotherapy.  It is unlikely that she would ever be a candidate for resection of the primary and mushy were to have a perforation or obstruction. Her liver disease is not resectable.  However, if she were to have an extremely good response to chemotherapy and have this be prolonged, it may be that she may present for resection of the primary if she has ongoing symptoms.  She will need to see genetics and have genetic testing. She is going to see Dr. Olevia Perches of GI this afternoon. 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 FAMILY HISTORY OF COLON CANCER (V16.0  Z80.0) Impression: Referral to genetics. Current Plans  Pt Education - ccs port insertion education   Signed by Stark Klein, MD (07/21/2014 11:01 AM)

## 2014-07-21 NOTE — Progress Notes (Signed)
Patient was seen in [facility].  59 year old female diagnosed with metastatic colon cancer to the liver.  She is a patient of Dr. Burr Medico.  PMH includes HLD, DM, HTN.  Medications include lantus, humulog, reglan, zofran, and zocor.  Labs include Na of 134.  Height:  64 inches. Weight: 156 pounds. UBW: 172 pounds on May 21, 2014. BMI: 26.76  Patient seen in [facility] Clinic with husband. States she had nausea and decreased oral intake for some time. (since November per chart) Patient given zofran which has relieved her nausea and allowed her to increase her oral intake. Patient eats 3 meals daily.  Nutrition Diagnosis:   Unintentional weight loss related to nausea and new diagnosis of metastatic colon cancer as evidenced by 9% weight loss since over 1 month.  Intervention:   Educated patient to consume small frequent meals and snacks with high protein and high calorie foods. Provided fact sheet on increasing calories and protein. Reviewed importance of patient continuing nausea medications to prevent nausea. Questions answered and teach back method used.  Contact information was provided.  Monitoring, Evaluation, Goals: Patient will tolerate increased calories and protein to minimize further weight loss.  Next Visit:  To be scheduled as needed.

## 2014-07-21 NOTE — Telephone Encounter (Signed)
per pof to add [employer] class-tried to call pt-no answering machine-mailed copy of updated sch

## 2014-07-21 NOTE — Progress Notes (Signed)
[facility] Psychosocial Distress Screening Clinical Social Work  Clinical Social Work met with pt and her husband at [facility] to introduce self, explain role of Pt and Family Support Team and reviewed distress screening protocol.  The patient scored a 1 on the Psychosocial Distress Thermometer which indicates mild distress. Pt very quiet without much emotion today. Clinical Social Worker attempted to assess for distress and other psychosocial needs. Pt made good eye contact, but not really wanting to talk much today. CSW encouraged pt to reach out as needed. CSW provided pt with support team calendar and hope pt will attend GI Group in the future.   ONCBCN DISTRESS SCREENING 07/21/2014  Screening Type Initial Screening  Distress experienced in past week (1-10) 0   Clinical Social Worker follow up needed: No.  If yes, follow up plan:  Loren Racer, [place]  [facility] Phone: [phone] Fax: [phone]

## 2014-07-21 NOTE — Patient Instructions (Signed)
Toileting Techniques for Bowel Movements (Defecation) Using your belly (abdomen) and pelvic floor muscles to have a bowel movement is usually instinctive.  Sometimes people can have problems with these muscles and have to relearn proper defecation (emptying) techniques.  If you have weakness in your muscles, organs that are falling out, decreased sensation in your pelvis, or ignore your urge to go, you may find yourself straining to have a bowel movement.  You are straining if you are: . holding your breath or taking in a huge gulp of air and holding it  . keeping your lips and jaw tensed and closed tightly . turning red in the face because of excessive pushing or forcing . developing or worsening your  hemorrhoids . getting faint while pushing . not emptying completely and have to defecate many times a day  If you are straining, you are actually making it harder for yourself to have a bowel movement.  Many people find they are pulling up with the pelvic floor muscles and closing off instead of opening the anus. Due to lack pelvic floor relaxation and coordination the abdominal muscles, one has to work harder to push the feces out.  Many people have never been taught how to defecate efficiently and effectively.  Notice what happens to your body when you are having a bowel movement.  While you are sitting on the toilet pay attention to the following areas: . Jaw and mouth position . Angle of your hips   . Whether your feet touch the ground or not . Arm placement  . Spine position . Waist . Belly tension . Anus (opening of the anal canal)  An Evacuation/Defecation Plan   Here are the 4 basic points:  1. Lean forward enough for your elbows to rest on your knees 2. Support your feet on the floor or use a low stool if your feet don't touch the floor  3. Push out your belly as if you have swallowed a beach ball-you should feel a widening of your waist 4. Open and relax your pelvic floor muscles,  rather than tightening around the anus      The following conditions my require modifications to your toileting posture:  . If you have had surgery in the past that limits your back, hip, pelvic, knee or ankle flexibility . Constipation   Your healthcare practitioner may make the following additional suggestions and adjustments:  1) Sit on the toilet  a) Make sure your feet are supported. b) Notice your hip angle and spine position-most people find it effective to lean forward or raise their knees, which can help the muscles around the anus to relax  c) When you lean forward, place your forearms on your thighs for support  2) Relax suggestions a) Breath deeply in through your nose and out slowly through your mouth as if you are smelling the flowers and blowing out the candles. b) To become aware of how to relax your muscles, contracting and releasing muscles can be helpful.  Pull your pelvic floor muscles in tightly by using the image of holding back gas, or closing around the anus (visualize making a circle smaller) and lifting the anus up and in.  Then release the muscles and your anus should drop down and feel open. Repeat 5 times ending with the feeling of relaxation. c) Keep your pelvic floor muscles relaxed; let your belly bulge out. d) The digestive tract starts at the mouth and ends at the anal opening, so be   sure to relax both ends of the tube.  Place your tongue on the roof of your mouth with your teeth separated.  This helps relax your mouth and will help to relax the anus at the same time.  3) Empty (defecation) a) Keep your pelvic floor and sphincter relaxed, then bulge your anal muscles.  Make the anal opening wide.  b) Stick your belly out as if you have swallowed a beach ball. c) Make your belly wall hard using your belly muscles while continuing to breathe. Doing this makes it easier to open your anus. d) Breath out and give a grunt (or try using other sounds such as  ahhhh, shhhhh, ohhhh or grrrrrrr).  4) Finish a) As you finish your bowel movement, pull the pelvic floor muscles up and in.  This will leave your anus in the proper place rather than remaining pushed out and down. If you leave your anus pushed out and down, it will start to feel as though that is normal and give you incorrect signals about needing to have a bowel movement.    Earlie Counts, PT [facility] [address] [place], [place] [address]  Ways to get started on an exercise program 1.  Start for 10 minutes per day with a walking program. 2. Work towards 30 minutes of exercise per day 3. When you do an aerobic exercise program start on a low level 4. Water aerobics is a good place due to decreased strain on your joints 5. Begin your exercise program gradually and progress slowly over time 6. When exercising use correct form. a. Keep neutral spine b. Engage abdominals c. Keep chest up  d. Chin down e. Do not lock your knees  Earlie Counts, PT [facility] at [place], [place], [place] [address] [phone]  Toileting Techniques for Bowel Movements (Defecation) Using your belly (abdomen) and pelvic floor muscles to have a bowel movement is usually instinctive.  Sometimes people can have problems with these muscles and have to relearn proper defecation (emptying) techniques.  If you have weakness in your muscles, organs that are falling out, decreased sensation in your pelvis, or ignore your urge to go, you may find yourself straining to have a bowel movement.  You are straining if you are: . holding your breath or taking in a huge gulp of air and holding it  . keeping your lips and jaw tensed and closed tightly . turning red in the face because of excessive pushing or forcing . developing or worsening your  hemorrhoids . getting faint while pushing . not emptying completely and have to defecate many times a  day  If you are straining, you are actually making it harder for yourself to have a bowel movement.  Many people find they are pulling up with the pelvic floor muscles and closing off instead of opening the anus. Due to lack pelvic floor relaxation and coordination the abdominal muscles, one has to work harder to push the feces out.  Many people have never been taught how to defecate efficiently and effectively.  Notice what happens to your body when you are having a bowel movement.  While you are sitting on the toilet pay attention to the following areas: . Jaw and mouth position . Angle of your hips   . Whether your feet touch the ground or not . Arm placement  . Spine position . Waist . Belly tension . Anus (opening of the  anal canal)  An Evacuation/Defecation Plan   Here are the 4 basic points:  5. Lean forward enough for your elbows to rest on your knees 6. Support your feet on the floor or use a low stool if your feet don't touch the floor  7. Push out your belly as if you have swallowed a beach ball-you should feel a widening of your waist 8. Open and relax your pelvic floor muscles, rather than tightening around the anus      The following conditions my require modifications to your toileting posture:  . If you have had surgery in the past that limits your back, hip, pelvic, knee or ankle flexibility . Constipation   Your healthcare practitioner may make the following additional suggestions and adjustments:  4) Sit on the toilet  a) Make sure your feet are supported. b) Notice your hip angle and spine position-most people find it effective to lean forward or raise their knees, which can help the muscles around the anus to relax  c) When you lean forward, place your forearms on your thighs for support  5) Relax suggestions a) Breath deeply in through your nose and out slowly through your mouth as if you are smelling the flowers and blowing out the candles. b) To become  aware of how to relax your muscles, contracting and releasing muscles can be helpful.  Pull your pelvic floor muscles in tightly by using the image of holding back gas, or closing around the anus (visualize making a circle smaller) and lifting the anus up and in.  Then release the muscles and your anus should drop down and feel open. Repeat 5 times ending with the feeling of relaxation. c) Keep your pelvic floor muscles relaxed; let your belly bulge out. d) The digestive tract starts at the mouth and ends at the anal opening, so be sure to relax both ends of the tube.  Place your tongue on the roof of your mouth with your teeth separated.  This helps relax your mouth and will help to relax the anus at the same time.  6) Empty (defecation) a) Keep your pelvic floor and sphincter relaxed, then bulge your anal muscles.  Make the anal opening wide.  b) Stick your belly out as if you have swallowed a beach ball. c) Make your belly wall hard using your belly muscles while continuing to breathe. Doing this makes it easier to open your anus. d) Breath out and give a grunt (or try using other sounds such as ahhhh, shhhhh, ohhhh or grrrrrrr).  4) Finish a) As you finish your bowel movement, pull the pelvic floor muscles up and in.  This will leave your anus in the proper place rather than remaining pushed out and down. If you leave your anus pushed out and down, it will start to feel as though that is normal and give you incorrect signals about needing to have a bowel movement.    Earlie Counts, PT [facility] [address] [place] [address] [place], [place] [address]

## 2014-07-21 NOTE — Patient Instructions (Addendum)
You have been scheduled for a colonoscopy. Please follow written instructions given to you at your visit today.  Please pick up your prep supplies at the pharmacy within the next 1-3 days. If you use inhalers (even only as needed), please bring them with you on the day of your procedure. Your physician has requested that you go to www.startemmi.com and enter the access code given to you at your visit today. This web site gives a general overview about your procedure. However, you should still follow specific instructions given to you by our office regarding your preparation for the procedure.  Dr Burr Medico, Dr Edrick Oh, DR F.Barry Dienes

## 2014-07-21 NOTE — Progress Notes (Signed)
[facility]  Telephone:([phone] Fax:([phone]) [place] Note   Patient Care Team: Dione Housekeeper, MD as PCP - General (Family Medicine) Tania Ade, RN as Registered Nurse (Oncology) 07/21/2014  CHIEF COMPLAINTS/PURPOSE OF CONSULTATION:  Abnormal CT scan   HISTORY OF PRESENTING ILLNESS:  Michelle Erickson 59 y.o. female is here because of abnoaml CT findings which is highly suspecious for metastatic colon cancer.   She presented intermittent nausea, anorexia and abdominal pain since Nov 2015, she has mild pain in the LUQ, crapy pain, positional (bending over),   it happened once every 2-3 weeks, and it has been more frequent in the past month. She has normal BM, but did notice the caliber of the stool is smaller than before. She denies any melena or hematochezia. She lost about 13 lbs in the past 6 months.   She presents to local emergency room in November 2015, and May 2016, was felt to be related to gastric virus. Due to the worsening symptoms lately, [facility], CT abdomen was obtained which showed multiple large liver metastasis and probable sigmoid colon mass. She was referred to [place] for further workup. She is scheduled to see GI Dr. Olevia Perches this afternoon.   MEDICAL HISTORY:  Past Medical History  Diagnosis Date  . Hyperlipemia   . Diabetes mellitus without complication   . Hypertension   . Metastatic colon cancer to liver 07/21/2014  . Wears glasses     contacts/glasses  . Snoring   . Back pain     SURGICAL HISTORY: Past Surgical History  Procedure Laterality Date  . Mouth surgery    . Cataract extraction Bilateral     SOCIAL HISTORY: History   Social History  . Marital Status: Married    Spouse Name: N/A  . Number of Children: N/A  . Years of Education: N/A   Occupational History  . Not on file.   Social History Main Topics  . Smoking status:  Former Smoker -- 0.50 packs/day for 6 years    Quit date: 01/21/1976  . Smokeless tobacco: Not on file  . Alcohol Use: No  . Drug Use: No  . Sexual Activity: Yes    Birth Control/ Protection: Post-menopausal     Comment: # 2 pregnancies and #2 live births   Other Topics Concern  . Not on file   Social History Narrative   Married, husband [name]   Has #2 adult children       FAMILY HISTORY: Family History  Problem Relation Age of Onset  . COPD Father   . Arthritis Sister   . Diabetes Sister   . Colon cancer Sister   . Colon polyps Sister   . Seizures Brother   . Colon cancer Sister     ALLERGIES:  has No Known Allergies.  MEDICATIONS:  Current Outpatient Prescriptions  Medication Sig Dispense Refill  . atorvastatin (LIPITOR) 40 MG tablet Take 40 mg by mouth daily.    . insulin glargine (LANTUS) 100 UNIT/ML injection Inject 28 Units into the skin at bedtime.    . insulin lispro (HUMALOG) 100 UNIT/ML injection Inject into the skin 2 (two) times daily. Sliding scale    . metoCLOPramide (REGLAN) 10 MG tablet Take 1 tablet (10 mg total) by mouth every 6 (six) hours as needed for nausea (or headache). 30 tablet 0  . ondansetron (ZOFRAN-ODT) 8 MG disintegrating tablet Take 1 tablet (8 mg total) by mouth  every 8 (eight) hours as needed for nausea or vomiting. 20 tablet 0  . amLODipine (NORVASC) 5 MG tablet Take 5 mg by mouth daily.     No current facility-administered medications for this visit.    REVIEW OF SYSTEMS:   Constitutional: Denies fevers, chills or abnormal night sweats Eyes: Denies blurriness of vision, double vision or watery eyes Ears, nose, mouth, throat, and face: Denies mucositis or sore throat Respiratory: Denies cough, dyspnea or wheezes Cardiovascular: Denies palpitation, chest discomfort or lower extremity swelling Gastrointestinal:  Denies nausea, heartburn or change in bowel habits Skin: Denies abnormal skin rashes Lymphatics: Denies new  lymphadenopathy or easy bruising Neurological:Denies numbness, tingling or new weaknesses Behavioral/Psych: Mood is stable, no new changes  All other systems were reviewed with the patient and are negative.  PHYSICAL EXAMINATION: ECOG PERFORMANCE STATUS: 1 - Symptomatic but completely ambulatory  Filed Vitals:   07/21/14 0852  BP: 143/73  Pulse: 100  Temp: 98.5 F (36.9 C)  Resp: 18   Filed Weights   07/21/14 0852  Weight: 156 lb (70.761 kg)    GENERAL:alert, no distress and comfortable SKIN: skin color, texture, turgor are normal, no rashes or significant lesions EYES: normal, conjunctiva are pink and non-injected, sclera clear OROPHARYNX:no exudate, no erythema and lips, buccal mucosa, and tongue normal  NECK: supple, thyroid normal size, non-tender, without nodularity LYMPH:  no palpable lymphadenopathy in the cervical, axillary or inguinal LUNGS: clear to auscultation and percussion with normal breathing effort HEART: regular rate & rhythm and no murmurs and no lower extremity edema ABDOMEN:abdomen soft, non-tender and normal bowel sounds Musculoskeletal:no cyanosis of digits and no clubbing  PSYCH: alert & oriented x 3 with fluent speech NEURO: no focal motor/sensory deficits  LABORATORY DATA:  I have reviewed the data as listed Lab Results  Component Value Date   WBC 9.8 07/13/2014   HGB 12.9 07/13/2014   HCT 38.0 07/13/2014   MCV 81.3 07/13/2014   PLT 245 07/13/2014    Recent Labs  05/21/14 0408 07/13/14 2051 07/13/14 2058  NA 137  --  134*  K 4.2  --  4.2  CL 103  --  100*  CO2 26  --   --   GLUCOSE 116*  --  94  BUN 17  --  19  CREATININE 1.02*  --  1.00  CALCIUM 9.2  --   --   GFRNONAA 58*  --   --   GFRAA >60  --   --   PROT 7.8 7.6  --   ALBUMIN 4.2 3.8  --   AST 59* 65*  --   ALT 50 54  --   ALKPHOS 107 146*  --   BILITOT 0.5 0.9  --   BILIDIR  --  0.2  --   IBILI  --  0.7  --     RADIOGRAPHIC STUDIES: I have personally reviewed the  radiological images as listed and agreed with the findings in the report.  Ct Abdomen Pelvis W Contrast 07/14/2014   IMPRESSION: 6 cm segment of sigmoid bowel wall thickening concerning for neoplasm with adjacent 14 x 12 mm nodule which may reflect direct tumoral extension or nodal metastasis. Diffuse hepatic metastasis. No bowel obstruction. Recommend follow-up sigmoidoscopy/colonoscopy on a nonemergent basis.  Mild bladder wall thickening could reflect underdistention or cystitis.  Acute findings discussed with and reconfirmed by Dr.NATHAN PICKERING on 07/14/2014 at 1:36 am.   Electronically Signed   By: Thana Farr.D.  On: 07/14/2014 01:36    ASSESSMENT & PLAN: 59 year old African-American female, presented with intermittent nausea vomiting and anemia.   1. 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 -This is highly suspicious for colon cancer with liver metastasis, adenocarcinoma is most likely, however neuroendocrine is also possible. -She is scheduled to see a gastroenterologist Dr. Olevia Perches at this afternoon, and possible colonoscopy or flexible sigmoidoscopy -I recommend liver biopsy to confirm the metastasis and establish a diagnosis. Benefits and risks were discussed with patient, she agrees. I'll refer her to interventional radiology for the biopsy -I briefly discussed the limited options for metastatic colon adenocarcinoma, which is likely can be systemic chemotherapy. -I'll obtain a CT chest to ruled out thoracic metastasis -Lab today including CEA -She was seen by Dr. Barry Dienes for port placement, and discuss the role of surgery -I'll see him back in 2-3 weeks after her liver biopsy.  2. Anemia -Likely secondary to tumor bleeding and iron deficiency -We'll check her ferritin and iron level today  3. DM, HTN -Continue follow-up with PCP  PLan -lab  today -CT chest  -IR liver biopsy -RTC in 2-3 weeks after liver biopsy  All questions were answered. The patient knows to call the clinic with any problems, questions or concerns. I spent 55 minutes counseling the patient face to face. The total time spent in the appointment was 60 minutes and more than 50% was on counseling.     Truitt Merle, MD 07/21/2014 9:16 AM

## 2014-07-21 NOTE — Therapy (Signed)
[facility] [address], [place], [place], [address] Phone: [phone]   Fax:  [phone]  Physical Therapy Evaluation  Patient Details  Name: Michelle Erickson MRN: [id number] Date of Birth: [birth year] Referring Provider:  Truitt Merle, MD  Encounter Date: 07/21/2014      PT End of Session - 07/21/14 1014    Visit Number 1   PT Start Time 6767   PT Stop Time 0958   PT Time Calculation (min) 19 min   Activity Tolerance Patient tolerated treatment well   Behavior During Therapy [facility] for tasks assessed/performed      Past Medical History  Diagnosis Date  . Hyperlipemia   . Diabetes mellitus without complication   . Hypertension   . Metastatic colon cancer to liver 07/21/2014  . Wears glasses     contacts/glasses  . Snoring   . Back pain     Past Surgical History  Procedure Laterality Date  . Mouth surgery    . Cataract extraction Bilateral     There were no vitals filed for this visit.  Visit Diagnosis:  Weakness - Plan: PT plan of care cert/re-cert      Subjective Assessment - 07/21/14 1005    Subjective Patient is a 59 year old female with diagnosis of metastatic colon cancer confirmed by CT scan on 07/14/2014. Patient reports no pain. Patient is attending GI clinic. Patient is on no exercise program.    Patient Stated Goals learn information    Currently in Pain? No/denies            [facility] PT Assessment - 07/21/14 0001    Assessment   Medical Diagnosis Metastatic colon cancer   Onset Date/Surgical Date 07/14/14   Prior Therapy None   Precautions   Precautions Other (comment)   Precaution Comments No ultrasound   Balance Screen   Has the patient fallen in the past 6 months No   Has the patient had a decrease in activity level because of a fear of falling?  No   Is the patient reluctant to leave their home because of a fear of falling?  No   Prior Function   Level of Independence Independent   Observation/Other Assessments   Focus on Therapeutic Outcomes (FOTO)  therapist discretion with evaluation is 10% limited due to her diagnosis   Posture/Postural Control   Posture/Postural Control No significant limitations   ROM / Strength   AROM / PROM / Strength AROM;Strength   AROM   Overall AROM Comments Lumbar extension decreased by 25%   Strength   Overall Strength Comments bilateral hip strength 5/5   Balance   Balance Assessed --  single leg stance bil. 4 sec                           PT Education - 07/21/14 1013    Education provided Yes   Education Details walking program, tips on coughing, toileting technique   Person(s) Educated Patient;Spouse   Methods Explanation;Demonstration;Tactile cues;Verbal cues;Handout   Comprehension Returned demonstration;Verbalized understanding             PT Long Term Goals - 07/21/14 1019    PT LONG TERM GOAL #1   Title Instruction on walking program to increase energy, tips on toileting technique, coughing technique   Time 1   Period Days   Status Achieved  Plan - 07/29/14 1015    Clinical Impression Statement Patient is a 59 year old female with diagnosis of metastatic colon cancer on 06/22/2014. Patient is attending GI clinic. Patient presents with no pain, decreased lumbar extension by 25%, bil. hip strength is 5/5, single leg stance is 4 sec. bil. and not on an exercise program.  Patient benefits from physical therapy for a walking program, understanding toileting technique, and ways to cough to not strain abdomen.    Pt will benefit from skilled therapeutic intervention in order to improve on the following deficits Decreased balance;Decreased activity tolerance   Rehab Potential Excellent   Clinical Impairments Affecting Rehab Potential None   PT Frequency 1x / week   PT Duration --  1 time session in GI clinic   PT Treatment/Interventions ADLs/Self Care Home  Management;Patient/family education;Therapeutic exercise   PT Next Visit Plan one time visit but if patient feels she needs therapy for conditioning or pain in the furture we will be happy to see her   PT Home Exercise Plan current HEP   Recommended Other Services None   Consulted and Agree with Plan of Care Patient;Family member/caregiver   Family Member Consulted husband          G-Codes - 2014-07-29 1021    Functional Assessment Tool Used therapist discretion is limited by 10% due to her diagnosis   Functional Limitation Other PT primary   Other PT Primary Current Status (T6144) At least 1 percent but less than 20 percent impaired, limited or restricted   Other PT Primary Goal Status (R1540) At least 1 percent but less than 20 percent impaired, limited or restricted   Other PT Primary Discharge Status (G8676) At least 1 percent but less than 20 percent impaired, limited or restricted       Problem List Patient Active Problem List   Diagnosis Date Noted  . Metastatic colon cancer to liver 2014/07/29    GRAY,CHERYL,PT 2014-07-29, 10:24 AM   [facility] [address] [place], [place], [address] Phone: [phone]   Fax:  [phone]

## 2014-07-21 NOTE — [facility] Oncology Navigator Note (Signed)
Oncology Nurse Navigator Documentation  Oncology Nurse Navigator Flowsheets 07/21/2014  Referral date to RadOnc/MedOnc 07/17/2014  Navigator Encounter Type Clinic/MDC  Patient Visit Type Initial visit w/medical oncology and surgery  Treatment Phase Treatment planning  Barriers/Navigation Needs Education-[place]; Reason to complete staging  Support Groups/Services GI  Time Spent with Patient 30  Met with patient and husband, Michelle Erickson during new patient clinic  visit. Explained the role of the GI Nurse Navigator and provided New Patient Packet with information on: 1. Colon cancer 2. Support groups 3. Advanced Directives 4. Fall Safety Plan Answered questions, reviewed current treatment plan using TEACH back and provided emotional support. Provided copy of current treatment plan. She was also seen by CSW and Dietician and Physical Therapy. Placed referral for genetics per Dr. Burr Medico due to family history of #2 sisters with colon cancer. Patient was quiet during visit and answered questions and gave eye contact, but did not verbalize much-affect was somewhat flat. Was anxious to leave by appointment end.  Michelle Elks, RN, BSN GI Oncology [place]

## 2014-07-21 NOTE — Progress Notes (Signed)
Nijah Tejera [birth year] [id number]  Note: This dictation was prepared with Dragon digital system. Any transcriptional errors that result from this procedure are unintentional.   History of Present Illness: This is a 59 year old African-American female with acute nausea vomiting and diarrhea evaluated initially one week ago in the emergency room. CT scan showed diffuse  metastatic liver disease with the 6.35.2 cm lesion in the right lobe of the liver. 2 cm gallstone. A 6 cm segment of circumferential sigmoid wall thickening suggestive of a mass. Patient's symptoms have resolved. She currently  denies diarrhea, abdominal pain or constipation. She has been tolerating regular diet. She has lost about 13 pounds recently. She denies rectal bleeding. Her hemoglobin in ED was 10.8 hematocrit 33.9.She is an insulin-dependent diabetic. She is also hypertensive. She has never had a colonoscopy. Her liver function tests were mildly elevated AST of 65 and  alkaline phosphatase 146. There is no family history of colon cancer    Past Medical History  Diagnosis Date  . Hyperlipemia   . Diabetes mellitus without complication   . Hypertension   . Metastatic colon cancer to liver 07/21/2014  . Wears glasses     contacts/glasses  . Snoring   . Back pain     Past Surgical History  Procedure Laterality Date  . Mouth surgery    . Cataract extraction Bilateral     No Known Allergies  Family history and social history have been reviewed.  Review of Systems: History of recent all sinus infection treated with any twice a day 6. Recent nausea vomiting diarrhea which have resolved  The remainder of the 10 point ROS is negative except as outlined in the H&P  Physical Exam: General Appearance Well developed, in no distress appears unwell Eyes  Non icteric  HEENT  Non traumatic, normocephalic  Mouth No lesion, tongue papillated, no cheilosis Neck Supple without adenopathy, thyroid not enlarged, no carotid  bruits, no JVD Lungs Clear to auscultation bilaterally COR Normal S1, normal S2, regular rhythm, no murmur, quiet precordium Abdomen mildly Protuberant but soft and nontender. Liver edge at costal margin. Splenic tip not palpable. There is no ascites. Left lower and right lower quadrants unremarkable Rectal dark strongly Hemoccult-positive stool with traces ofl dark blood on my glove Extremities  No pedal edema Skin No lesions Neurological Alert and oriented x 3 Psychological Normal mood and affect  Assessment and Plan:   59 year old African-American female with suspected primary colon carcinoma without  Obstructive symptoms.. Also suspected liver metastases. She is anemic and Hemoccult-positive. She saw Dr.Feng this morning and is scheduled for percutaneous liver biopsy of the liver lesion. She will also undergo CT scan of the chest and  CEA level. I will go ahead and schedule patient for colonoscopy or at least flexible sigmoidoscopy  to  obtain tissue diagnosis. I feel pretty sure that this is a malignant process. She will be prepped with MoviPrep and I hope she can tolerate it. So far she does not have any obstructive symptoms     Delfin Edis 07/21/2014

## 2014-07-21 NOTE — Telephone Encounter (Signed)
Gave patient avs report and appointments for July. [facility]ul[facility] will  Contact patient re ct and biopsy - patient aware. Per 7/1 pof f/u 2-3 weeks after bx - f/u scheduled for 07/22/14. If biopsy date is less than 2 weeks from f/u patient will call to reschedule.

## 2014-07-22 ENCOUNTER — Encounter: Payer: Self-pay | Admitting: Hematology

## 2014-07-22 ENCOUNTER — Other Ambulatory Visit: Payer: Self-pay | Admitting: General Surgery

## 2014-07-22 LAB — CEA: CEA: 594.5 ng/mL — AB (ref 0.0–5.0)

## 2014-07-25 ENCOUNTER — Ambulatory Visit (AMBULATORY_SURGERY_CENTER): Payer: BLUE CROSS/BLUE SHIELD | Admitting: Internal Medicine

## 2014-07-25 ENCOUNTER — Encounter: Payer: Self-pay | Admitting: Internal Medicine

## 2014-07-25 VITALS — BP 168/87 | HR 84 | Temp 97.9°F | Resp 15 | Ht 64.0 in | Wt 157.0 lb

## 2014-07-25 DIAGNOSIS — K6389 Other specified diseases of intestine: Secondary | ICD-10-CM

## 2014-07-25 DIAGNOSIS — C187 Malignant neoplasm of sigmoid colon: Secondary | ICD-10-CM | POA: Diagnosis not present

## 2014-07-25 DIAGNOSIS — R160 Hepatomegaly, not elsewhere classified: Secondary | ICD-10-CM

## 2014-07-25 LAB — GLUCOSE, CAPILLARY
Glucose-Capillary: 117 mg/dL — ABNORMAL HIGH (ref 65–99)
Glucose-Capillary: 98 mg/dL (ref 65–99)

## 2014-07-25 MED ORDER — SODIUM CHLORIDE 0.9 % IV SOLN: 500.0000 mL | INTRAVENOUS | Status: DC

## 2014-07-25 NOTE — Patient Instructions (Signed)
YOU HAD AN ENDOSCOPIC PROCEDURE TODAY AT THE [facility]:   Refer to the procedure report that was given to you for any specific questions about what was found during the examination.  If the procedure report does not answer your questions, please call your gastroenterologist to clarify.  If you requested that your care partner not be given the details of your procedure findings, then the procedure report has been included in a sealed envelope for you to review at your convenience later.  YOU SHOULD EXPECT: Some feelings of bloating in the abdomen. Passage of more gas than usual.  Walking can help get rid of the air that was put into your GI tract during the procedure and reduce the bloating. If you had a lower endoscopy (such as a colonoscopy or flexible sigmoidoscopy) you may notice spotting of blood in your stool or on the toilet paper. If you underwent a bowel prep for your procedure, you may not have a normal bowel movement for a few days.  Please Note:  You might notice some irritation and congestion in your nose or some drainage.  This is from the oxygen used during your procedure.  There is no need for concern and it should clear up in a day or so.  SYMPTOMS TO REPORT IMMEDIATELY:   Following lower endoscopy (colonoscopy or flexible sigmoidoscopy):  Excessive amounts of blood in the stool  Significant tenderness or worsening of abdominal pains  Swelling of the abdomen that is new, acute  Fever of 100F or higher  For urgent or emergent issues, a gastroenterologist can be reached at any hour by calling [phone].   DIET: Your first meal following the procedure should be a small meal and then it is ok to progress to your normal diet. Heavy or fried foods are harder to digest and may make you feel nauseous or bloated.  Likewise, meals heavy in dairy and vegetables can increase bloating.  Drink plenty of fluids but you should avoid alcoholic beverages for 24  hours.  ACTIVITY:  You should plan to take it easy for the rest of today and you should NOT DRIVE or use heavy machinery until tomorrow (because of the sedation medicines used during the test).    FOLLOW UP: Our staff will call the number listed on your records the next business day following your procedure to check on you and address any questions or concerns that you may have regarding the information given to you following your procedure. If we do not reach you, we will leave a message.  However, if you are feeling well and you are not experiencing any problems, there is no need to return our call.  We will assume that you have returned to your regular daily activities without incident.  If any biopsies were taken you will be contacted by phone or by letter within the next 1-3 weeks.  Please call us at [phone] if you have not heard about the biopsies in 3 weeks.    SIGNATURES/CONFIDENTIALITY: You and/or your care partner have signed paperwork which will be entered into your electronic medical record.  These signatures attest to the fact that that the information above on your After Visit Summary has been reviewed and is understood.  Full responsibility of the confidentiality of this discharge information lies with you and/or your care-partner.  Await biopsy results. 

## 2014-07-25 NOTE — Op Note (Addendum)
[place]  [address] [place], [address]   COLONOSCOPY PROCEDURE REPORT  PATIENT: Michelle, Erickson  MR#: [id number] BIRTHDATE: 01-20-1956 , 59  yrs. old GENDER: female ENDOSCOPIST: Lafayette Dragon, MD REFERRED BY:Dr Burr Medico, Dr Mamie Laurel, Dr Edrick Oh PROCEDURE DATE:  07/25/2014 PROCEDURE:   Colonoscopy, diagnostic and Colonoscopy with biopsy First Screening Colonoscopy - Avg.  risk and is 50 yrs.  old or older Yes.  Prior Negative Screening - Now for repeat screening. N/A  History of Adenoma - Now for follow-up colonoscopy & has been > or = to 3 yrs.  N/A  Polyps removed today? No Recommend repeat exam, <10 yrs? Yes high risk ASA CLASS:   Class III INDICATIONS:Evaluation of unexplained GI bleeding, Evaluation of barium enema or other imaging study of an abnormality that is likely to be clinically significant, such as filling defect or stricture, Colorectal Neoplasm Risk Assessment for this procedure is average risk, and abnormal CT scan of the abdomen consistent with sigmoid mass as well as liver mass.  Heme positive stool. MEDICATIONS: Monitored anesthesia care and Propofol 200 mg IV  DESCRIPTION OF PROCEDURE:   After the risks benefits and alternatives of the procedure were thoroughly explained, informed consent was obtained.  The digital rectal exam revealed no abnormalities of the rectum.   The [id number]  endoscope was introduced through the anus and advanced to the cecum, which was identified by both the appendix and ileocecal valve. No adverse events experienced.   The quality of the prep was good.  (MoviPrep was used)  The instrument was then slowly withdrawn as the colon was fully examined. Estimated blood loss is zero unless otherwise noted in this procedure report.      COLON FINDINGS: A near circumferential medium polypoid shaped, ulcerated and fungating mass with friable surfaces was found in the sigmoid colon.from 18-25 cm from the anal  opening. The Mott Ceballos partially obstructing. But we were able to traverse into the proximal colon location of the mass involves August but placing endoclips at 25 cm and at 18 cm. Also attended to was applied to the proximal and distal end of the mass at 18 and 25 cm.  Multiple biopsies were performed using cold forceps.  Retroflexed views revealed no abnormalities. The time to cecum = 5.00 Withdrawal time = 13.32   The scope was withdrawn and the procedure completed. COMPLICATIONS: There were no immediate complications.  ENDOSCOPIC IMPRESSION: Near circumferential medium mass was found in the sigmoid colon; multiple biopsies were performed using cold forceps ,partially obstructing mass consistent with carcinoma. Placement of endoclips and tattoo at the margins of the mass which extends from 18-25 cm from the anus  RECOMMENDATIONS: 1.  Await pathology results 2.  Patient has been already seen by Dr.  Burr Medico from oncology [facility] pending, Patient already has an appointment with Dr.F.Byerly for placement of Port-A-Cath and for surgical resection of the tumor 3.   Recall colonoscopy in 12 months  eSigned:  Lafayette Dragon, MD 07/25/2014 10:37 AM Revised: 07/25/2014 10:37 AM  cc:   PATIENT NAME:  Michelle, Erickson MR#: [id number]

## 2014-07-25 NOTE — Progress Notes (Signed)
Called to room to assist during endoscopic procedure.  Patient ID and intended procedure confirmed with present staff. Received instructions for my participation in the procedure from the performing physician.  

## 2014-07-25 NOTE — Progress Notes (Signed)
A/ox3 pleased with MAC, report to Jane RN 

## 2014-07-25 NOTE — Progress Notes (Signed)
4599 first dose of propofol given at [phone]

## 2014-07-26 ENCOUNTER — Telehealth: Payer: Self-pay | Admitting: *Deleted

## 2014-07-26 NOTE — Telephone Encounter (Signed)
  Follow up Call-  Call back number 07/25/2014  Post procedure Call Back phone  # [phone]  Permission to leave phone message No     Patient questions:  Do you have a fever, pain , or abdominal swelling? No. Pain Score  0 *  Have you tolerated food without any problems? Yes.    Have you been able to return to your normal activities? Yes.    Do you have any questions about your discharge instructions: Diet   No. Medications  No. Follow up visit  No.  Do you have questions or concerns about your Care? No.  Actions: * If pain score is 4 or above: No action needed, pain <4.

## 2014-07-28 ENCOUNTER — Encounter (HOSPITAL_COMMUNITY): Payer: Self-pay

## 2014-07-28 ENCOUNTER — Ambulatory Visit (HOSPITAL_COMMUNITY)
Admission: RE | Admit: 2014-07-28 | Discharge: 2014-07-28 | Disposition: A | Discharge status: Home / Self Care | Payer: BLUE CROSS/BLUE SHIELD | Source: Ambulatory Visit | Attending: Hematology | Admitting: Hematology

## 2014-07-28 ENCOUNTER — Telehealth: Payer: Self-pay | Admitting: *Deleted

## 2014-07-28 DIAGNOSIS — C189 Malignant neoplasm of colon, unspecified: Secondary | ICD-10-CM | POA: Insufficient documentation

## 2014-07-28 DIAGNOSIS — C787 Secondary malignant neoplasm of liver and intrahepatic bile duct: Secondary | ICD-10-CM

## 2014-07-28 MED ORDER — IOHEXOL 300 MG/ML  SOLN
100.0000 mL | Freq: Once | INTRAMUSCULAR | Status: AC | PRN
  Administered 2014-07-28: 80 mL via INTRAVENOUS

## 2014-07-28 NOTE — Telephone Encounter (Signed)
Oncology Nurse Navigator Documentation  Oncology Nurse Navigator Flowsheets 07/28/2014  Referral date to RadOnc/MedOnc -  Navigator Encounter Type Telephone  Patient Visit Type -  Treatment Phase Staging   Barriers/Navigation Needs No barriers at this time  Support Groups/Services -  Time Spent with Patient (Retired) 5  Notified Keaghan that her CT chest showed no sign of cancer, except at liver, which we already knew about. She appreciated the call and not having to wait for results.

## 2014-07-30 ENCOUNTER — Other Ambulatory Visit: Payer: Self-pay | Admitting: Hematology

## 2014-07-31 ENCOUNTER — Telehealth: Payer: Self-pay | Admitting: *Deleted

## 2014-07-31 NOTE — Telephone Encounter (Signed)
-----   Message from Lafayette Dragon, MD sent at 07/30/2014  7:20 PM EDT ----- Regarding: RE: ? liver biopsy Rollene Fare, Mrs Stantz was scheduled for liver Bx , please call her to make sure she goes, Dr Burr Medico needs it  to decide about the treatment. Thanx DB ----- Message -----    From: Truitt Merle, MD    Sent: 07/30/2014   5:22 PM      To: Stark Klein, MD, Lafayette Dragon, MD Subject: RE: ? liver biopsy                             Hi Dora and Dorris Fetch,  Sorry that I was on vacation last week, not able to access the [employer].   Yes, I do need the liver biopsy to confirm the metastases and use the metastatic tissue for molecular testing.   Dorris Fetch, it would be great if you can get her in for port this week, so I can start chemo   Thanks!  Krista Blue  ----- Message -----    From: Lafayette Dragon, MD    Sent: 07/25/2014   4:46 PM      To: Stark Klein, MD, Truitt Merle, MD Subject: RE: ? liver biopsy                             Faera, I will leave it up to Krista Blue at this point. Thanx ----- Message -----    From: Stark Klein, MD    Sent: 07/25/2014   1:52 PM      To: Tennis Ship, Lafayette Dragon, MD, # Subject: RE: ? liver biopsy                             Colletta Maryland, Can I get this port in 7/14? tx FB ----- Message -----    From: Lafayette Dragon, MD    Sent: 07/25/2014  11:48 AM      To: Stark Klein, MD, Truitt Merle, MD Subject: ? 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 no other abnormalities. Biopsies are pending. I placed Metal clips and tattoos to mark the proximal and distal margins of the tumor. Does she still need a liver biopsy? Scheduled for this Friday.  The Path report should be out in 24 hours.She has an appointment with Dr Barry Dienes for Porta-cath placement for July 28, but not for resection.I will ask Dr Barry Dienes if the appointment could be moved closer. Thanx Dora  brodie

## 2014-07-31 NOTE — Telephone Encounter (Signed)
Patient notified and she will go for biopsy.

## 2014-08-01 ENCOUNTER — Encounter: Payer: Self-pay | Admitting: Internal Medicine

## 2014-08-01 ENCOUNTER — Other Ambulatory Visit: Payer: Self-pay | Admitting: Hematology

## 2014-08-02 ENCOUNTER — Other Ambulatory Visit: Payer: BLUE CROSS/BLUE SHIELD

## 2014-08-02 ENCOUNTER — Encounter (HOSPITAL_COMMUNITY)
Admission: RE | Admit: 2014-08-02 | Discharge: 2014-08-02 | Disposition: A | Discharge status: Home / Self Care | Payer: BLUE CROSS/BLUE SHIELD | Source: Ambulatory Visit | Attending: General Surgery | Admitting: General Surgery

## 2014-08-02 ENCOUNTER — Telehealth: Payer: Self-pay | Admitting: *Deleted

## 2014-08-02 ENCOUNTER — Other Ambulatory Visit: Payer: Self-pay | Admitting: Radiology

## 2014-08-02 ENCOUNTER — Other Ambulatory Visit: Payer: Self-pay

## 2014-08-02 ENCOUNTER — Encounter (HOSPITAL_COMMUNITY): Payer: Self-pay

## 2014-08-02 ENCOUNTER — Telehealth: Payer: Self-pay | Admitting: Hematology

## 2014-08-02 ENCOUNTER — Encounter: Payer: BLUE CROSS/BLUE SHIELD | Admitting: Genetic Counselor

## 2014-08-02 DIAGNOSIS — C787 Secondary malignant neoplasm of liver and intrahepatic bile duct: Secondary | ICD-10-CM | POA: Diagnosis not present

## 2014-08-02 DIAGNOSIS — C189 Malignant neoplasm of colon, unspecified: Secondary | ICD-10-CM | POA: Diagnosis present

## 2014-08-02 DIAGNOSIS — I10 Essential (primary) hypertension: Secondary | ICD-10-CM | POA: Diagnosis not present

## 2014-08-02 DIAGNOSIS — Z800 Family history of malignant neoplasm of digestive organs: Secondary | ICD-10-CM | POA: Diagnosis not present

## 2014-08-02 DIAGNOSIS — Z87891 Personal history of nicotine dependence: Secondary | ICD-10-CM | POA: Diagnosis not present

## 2014-08-02 DIAGNOSIS — D500 Iron deficiency anemia secondary to blood loss (chronic): Secondary | ICD-10-CM | POA: Diagnosis not present

## 2014-08-02 DIAGNOSIS — E119 Type 2 diabetes mellitus without complications: Secondary | ICD-10-CM | POA: Diagnosis not present

## 2014-08-02 LAB — URINALYSIS, ROUTINE W REFLEX MICROSCOPIC
BILIRUBIN URINE: NEGATIVE
Glucose, UA: NEGATIVE mg/dL
HGB URINE DIPSTICK: NEGATIVE
Ketones, ur: NEGATIVE mg/dL
Nitrite: NEGATIVE
Protein, ur: NEGATIVE mg/dL
SPECIFIC GRAVITY, URINE: 1.018 (ref 1.005–1.030)
Urobilinogen, UA: 1 mg/dL (ref 0.0–1.0)
pH: 5 (ref 5.0–8.0)

## 2014-08-02 LAB — BASIC METABOLIC PANEL
Anion gap: 7 (ref 5–15)
BUN: 13 mg/dL (ref 6–20)
CO2: 25 mmol/L (ref 22–32)
CREATININE: 1.08 mg/dL — AB (ref 0.44–1.00)
Calcium: 9.4 mg/dL (ref 8.9–10.3)
Chloride: 107 mmol/L (ref 101–111)
GFR calc non Af Amer: 54 mL/min — ABNORMAL LOW (ref >60)
Glucose, Bld: 147 mg/dL — ABNORMAL HIGH (ref 65–99)
Potassium: 4.7 mmol/L (ref 3.5–5.1)
Sodium: 139 mmol/L (ref 135–145)

## 2014-08-02 LAB — CBC
HEMATOCRIT: 31.2 % — AB (ref 36.0–46.0)
HEMOGLOBIN: 9.8 g/dL — AB (ref 12.0–15.0)
MCH: 25.7 pg — ABNORMAL LOW (ref 26.0–34.0)
MCHC: 31.4 g/dL (ref 30.0–36.0)
MCV: 81.7 fL (ref 78.0–100.0)
Platelets: 266 10*3/uL (ref 150–400)
RBC: 3.82 MIL/uL — AB (ref 3.87–5.11)
RDW: 14.9 % (ref 11.5–15.5)
WBC: 7.3 10*3/uL (ref 4.0–10.5)

## 2014-08-02 LAB — PROTIME-INR
INR: 1 (ref 0.00–1.49)
Prothrombin Time: 13.4 seconds (ref 11.6–15.2)

## 2014-08-02 LAB — URINE MICROSCOPIC-ADD ON

## 2014-08-02 LAB — APTT: aPTT: 36 seconds (ref 24–37)

## 2014-08-02 LAB — GLUCOSE, CAPILLARY: Glucose-Capillary: 121 mg/dL — ABNORMAL HIGH (ref 65–99)

## 2014-08-02 MED ORDER — CEFAZOLIN SODIUM-DEXTROSE 2-3 GM-% IV SOLR
2.0000 g | INTRAVENOUS | Status: AC
  Administered 2014-08-03: 2 g via INTRAVENOUS
  Filled 2014-08-02: qty 50

## 2014-08-02 NOTE — Telephone Encounter (Signed)
Per staff message and POF I have scheduled appts. Advised scheduler of appts. JMW  

## 2014-08-02 NOTE — Pre-Procedure Instructions (Signed)
Michelle Erickson  08/02/2014      Michelle Erickson [address] [place] [address] Phone: [phone] Fax: [phone]    Your procedure is scheduled on  Thursday  08/03/14  Report to [facility] Admitting at 1000 A.M.  Call this number if you have problems the morning of surgery:  [phone]   Remember:  Do not eat food or drink liquids after midnight.  Take these medicines the morning of surgery with A SIP OF WATER   AMLODIPINE (NORVASC). (STOP ASPIRIN, COUMADIN, PLAVIX, EFFIENT, HERBAL MEDICINES)   Do not wear jewelry, make-up or nail polish.  Do not wear lotions, powders, or perfumes.  You may wear deodorant.  Do not shave 48 hours prior to surgery.  Men may shave face and neck.  Do not bring valuables to the hospital.  [facility] is not responsible for any belongings or valuables.  Contacts, dentures or bridgework may not be worn into surgery.  Leave your suitcase in the car.  After surgery it may be brought to your room.  For patients admitted to the hospital, discharge time will be determined by your treatment team.  Patients discharged the day of surgery will not be allowed to drive home.   Name and phone number of your driver:   Special instructions:  Michelle Erickson - Preparing for Surgery  Before surgery, you can play an important role.  Because skin is not sterile, your skin needs to be as free of germs as possible.  You can reduce the number of germs on you skin by washing with CHG (chlorahexidine gluconate) soap before surgery.  CHG is an antiseptic cleaner which kills germs and bonds with the skin to continue killing germs even after washing.  Please DO NOT use if you have an allergy to CHG or antibacterial soaps.  If your skin becomes reddened/irritated stop using the CHG and inform your nurse when you arrive at Short Stay.  Do not shave (including legs and underarms) for at least 48 hours prior to the  first CHG shower.  You may shave your face.  Please follow these instructions carefully:   1.  Shower with CHG Soap the night before surgery and the                                morning of Surgery.  2.  If you choose to wash your hair, wash your hair first as usual with your       normal shampoo.  3.  After you shampoo, rinse your hair and body thoroughly to remove the                      Shampoo.  4.  Use CHG as you would any other liquid soap.  You can apply chg directly       to the skin and wash gently with scrungie or a clean washcloth.  5.  Apply the CHG Soap to your body ONLY FROM THE NECK DOWN.        Do not use on open wounds or open sores.  Avoid contact with your eyes,       ears, mouth and genitals (private parts).  Wash genitals (private parts)       with your normal soap.  6.  Wash thoroughly, paying special attention to the area where your  surgery        will be performed.  7.  Thoroughly rinse your body with warm water from the neck down.  8.  DO NOT shower/wash with your normal soap after using and rinsing off       the CHG Soap.  9.  Pat yourself dry with a clean towel.            10.  Wear clean pajamas.            11.  Place clean sheets on your bed the night of your first shower and do not        sleep with pets.  Day of Surgery  Do not apply any lotions/deoderants the morning of surgery.  Please wear clean clothes to the hospital/surgery center.    Please read over the following fact sheets that you were given. Pain Booklet, Coughing and Deep Breathing and Surgical Site Infection Prevention

## 2014-08-02 NOTE — Telephone Encounter (Signed)
s.w. pt and advised on July appt....pt ok and aware °

## 2014-08-03 ENCOUNTER — Encounter (HOSPITAL_COMMUNITY): Payer: Self-pay | Admitting: General Surgery

## 2014-08-03 ENCOUNTER — Ambulatory Visit (HOSPITAL_COMMUNITY): Payer: BLUE CROSS/BLUE SHIELD

## 2014-08-03 ENCOUNTER — Ambulatory Visit (HOSPITAL_COMMUNITY): Payer: BLUE CROSS/BLUE SHIELD | Admitting: Certified Registered Nurse Anesthetist

## 2014-08-03 ENCOUNTER — Ambulatory Visit (HOSPITAL_COMMUNITY): Payer: BLUE CROSS/BLUE SHIELD | Admitting: Vascular Surgery

## 2014-08-03 ENCOUNTER — Encounter (HOSPITAL_COMMUNITY)
Admission: RE | Disposition: A | Discharge status: Home / Self Care | Payer: Self-pay | Source: Ambulatory Visit | Attending: General Surgery

## 2014-08-03 ENCOUNTER — Ambulatory Visit (HOSPITAL_COMMUNITY): Admission: RE | Admit: 2014-08-03 | Payer: BLUE CROSS/BLUE SHIELD | Source: Ambulatory Visit

## 2014-08-03 ENCOUNTER — Ambulatory Visit (HOSPITAL_COMMUNITY)
Admission: RE | Admit: 2014-08-03 | Discharge: 2014-08-03 | Disposition: A | Discharge status: Home / Self Care | Payer: BLUE CROSS/BLUE SHIELD | Source: Ambulatory Visit | Attending: General Surgery | Admitting: General Surgery

## 2014-08-03 DIAGNOSIS — C189 Malignant neoplasm of colon, unspecified: Secondary | ICD-10-CM | POA: Insufficient documentation

## 2014-08-03 DIAGNOSIS — E119 Type 2 diabetes mellitus without complications: Secondary | ICD-10-CM | POA: Insufficient documentation

## 2014-08-03 DIAGNOSIS — Z95828 Presence of other vascular implants and grafts: Secondary | ICD-10-CM

## 2014-08-03 DIAGNOSIS — D500 Iron deficiency anemia secondary to blood loss (chronic): Secondary | ICD-10-CM | POA: Insufficient documentation

## 2014-08-03 DIAGNOSIS — Z87891 Personal history of nicotine dependence: Secondary | ICD-10-CM | POA: Insufficient documentation

## 2014-08-03 DIAGNOSIS — C787 Secondary malignant neoplasm of liver and intrahepatic bile duct: Secondary | ICD-10-CM | POA: Insufficient documentation

## 2014-08-03 DIAGNOSIS — Z800 Family history of malignant neoplasm of digestive organs: Secondary | ICD-10-CM | POA: Insufficient documentation

## 2014-08-03 DIAGNOSIS — Z419 Encounter for procedure for purposes other than remedying health state, unspecified: Secondary | ICD-10-CM

## 2014-08-03 DIAGNOSIS — I10 Essential (primary) hypertension: Secondary | ICD-10-CM | POA: Insufficient documentation

## 2014-08-03 HISTORY — PX: PORTACATH PLACEMENT: SHX2246

## 2014-08-03 LAB — GLUCOSE, CAPILLARY
GLUCOSE-CAPILLARY: 70 mg/dL (ref 65–99)
GLUCOSE-CAPILLARY: 75 mg/dL (ref 65–99)
Glucose-Capillary: 72 mg/dL (ref 65–99)

## 2014-08-03 LAB — HEMOGLOBIN A1C
Hgb A1c MFr Bld: 7.9 % — ABNORMAL HIGH (ref 4.8–5.6)
Mean Plasma Glucose: 180 mg/dL

## 2014-08-03 SURGERY — INSERTION, TUNNELED CENTRAL VENOUS DEVICE, WITH PORT
Anesthesia: General | Site: Chest

## 2014-08-03 MED ORDER — BUPIVACAINE-EPINEPHRINE (PF) 0.25% -1:200000 IJ SOLN
INTRAMUSCULAR | Status: AC
  Filled 2014-08-03: qty 30

## 2014-08-03 MED ORDER — ONDANSETRON HCL 4 MG/2ML IJ SOLN
INTRAMUSCULAR | Status: DC | PRN
  Administered 2014-08-03: 4 mg via INTRAVENOUS

## 2014-08-03 MED ORDER — MIDAZOLAM HCL 2 MG/2ML IJ SOLN
INTRAMUSCULAR | Status: AC
  Filled 2014-08-03: qty 2

## 2014-08-03 MED ORDER — FENTANYL CITRATE (PF) 250 MCG/5ML IJ SOLN
INTRAMUSCULAR | Status: DC | PRN
  Administered 2014-08-03: 100 ug via INTRAVENOUS

## 2014-08-03 MED ORDER — FENTANYL CITRATE (PF) 250 MCG/5ML IJ SOLN
INTRAMUSCULAR | Status: AC
  Filled 2014-08-03: qty 5

## 2014-08-03 MED ORDER — LACTATED RINGERS IV SOLN
INTRAVENOUS | Status: DC
  Administered 2014-08-03 (×2): via INTRAVENOUS

## 2014-08-03 MED ORDER — LIDOCAINE HCL (CARDIAC) 20 MG/ML IV SOLN
INTRAVENOUS | Status: DC | PRN
  Administered 2014-08-03: 60 mg via INTRAVENOUS

## 2014-08-03 MED ORDER — OXYCODONE-ACETAMINOPHEN 5-325 MG PO TABS: 1.0000 | ORAL_TABLET | ORAL | Status: DC | PRN

## 2014-08-03 MED ORDER — LIDOCAINE HCL (PF) 1 % IJ SOLN
INTRAMUSCULAR | Status: AC
  Filled 2014-08-03: qty 30

## 2014-08-03 MED ORDER — HEPARIN SOD (PORK) LOCK FLUSH 100 UNIT/ML IV SOLN
INTRAVENOUS | Status: DC | PRN
  Administered 2014-08-03: 500 [IU]

## 2014-08-03 MED ORDER — LIDOCAINE HCL 1 % IJ SOLN
INTRAMUSCULAR | Status: DC | PRN
  Administered 2014-08-03: 60 mL via INTRAMUSCULAR

## 2014-08-03 MED ORDER — 0.9 % SODIUM CHLORIDE (POUR BTL) OPTIME
TOPICAL | Status: DC | PRN
  Administered 2014-08-03: 1000 mL

## 2014-08-03 MED ORDER — MIDAZOLAM HCL 5 MG/5ML IJ SOLN
INTRAMUSCULAR | Status: DC | PRN
  Administered 2014-08-03: 2 mg via INTRAVENOUS

## 2014-08-03 MED ORDER — SODIUM CHLORIDE 0.9 % IR SOLN
Status: DC | PRN
  Administered 2014-08-03: 500 mL

## 2014-08-03 MED ORDER — EPHEDRINE SULFATE 50 MG/ML IJ SOLN
INTRAMUSCULAR | Status: DC | PRN
  Administered 2014-08-03: 10 mg via INTRAVENOUS

## 2014-08-03 MED ORDER — FENTANYL CITRATE (PF) 250 MCG/5ML IJ SOLN
INTRAMUSCULAR | Status: AC
Start: 2014-08-03 — End: 2014-08-03
  Filled 2014-08-03: qty 5

## 2014-08-03 MED ORDER — PHENYLEPHRINE HCL 10 MG/ML IJ SOLN
INTRAMUSCULAR | Status: DC | PRN
  Administered 2014-08-03: 40 ug via INTRAVENOUS

## 2014-08-03 MED ORDER — HEPARIN SOD (PORK) LOCK FLUSH 100 UNIT/ML IV SOLN
INTRAVENOUS | Status: AC
  Filled 2014-08-03: qty 5

## 2014-08-03 MED ORDER — PROPOFOL 10 MG/ML IV BOLUS
INTRAVENOUS | Status: DC | PRN
  Administered 2014-08-03: 170 mg via INTRAVENOUS

## 2014-08-03 SURGICAL SUPPLY — 40 items
BAG DECANTER FOR FLEXI CONT (MISCELLANEOUS) ×3 IMPLANT
BLADE SURG 11 STRL SS (BLADE) ×3 IMPLANT
BLADE SURG 15 STRL LF DISP TIS (BLADE) ×1 IMPLANT
BLADE SURG 15 STRL SS (BLADE) ×2
CHLORAPREP W/TINT 10.5 ML (MISCELLANEOUS) ×3 IMPLANT
COVER SURGICAL LIGHT HANDLE (MISCELLANEOUS) ×3 IMPLANT
CRADLE DONUT ADULT HEAD (MISCELLANEOUS) ×3 IMPLANT
DRAPE C-ARM 42X72 X-RAY (DRAPES) ×3 IMPLANT
DRAPE CHEST BREAST 15X10 FENES (DRAPES) ×3 IMPLANT
DRAPE UTILITY XL STRL (DRAPES) ×6 IMPLANT
ELECT COATED BLADE 2.86 ST (ELECTRODE) ×3 IMPLANT
ELECT REM PT RETURN 9FT ADLT (ELECTROSURGICAL) ×3
ELECTRODE REM PT RTRN 9FT ADLT (ELECTROSURGICAL) ×1 IMPLANT
GAUZE SPONGE 4X4 16PLY XRAY LF (GAUZE/BANDAGES/DRESSINGS) ×3 IMPLANT
GLOVE BIO SURGEON STRL SZ 6 (GLOVE) ×6 IMPLANT
GLOVE BIO SURGEON STRL SZ7 (GLOVE) ×3 IMPLANT
GLOVE BIO SURGEON STRL SZ7.5 (GLOVE) ×3 IMPLANT
GLOVE BIOGEL PI IND STRL 6.5 (GLOVE) ×1 IMPLANT
GLOVE BIOGEL PI INDICATOR 6.5 (GLOVE) ×2
GOWN STRL REUS W/ TWL LRG LVL3 (GOWN DISPOSABLE) ×1 IMPLANT
GOWN STRL REUS W/TWL 2XL LVL3 (GOWN DISPOSABLE) ×3 IMPLANT
GOWN STRL REUS W/TWL LRG LVL3 (GOWN DISPOSABLE) ×2
KIT BASIN OR (CUSTOM PROCEDURE TRAY) ×3 IMPLANT
KIT PORT POWER 8FR ISP CVUE (Catheter) ×3 IMPLANT
KIT ROOM TURNOVER OR (KITS) ×3 IMPLANT
LIQUID BAND (GAUZE/BANDAGES/DRESSINGS) ×3 IMPLANT
NEEDLE HYPO 25GX1X1/2 BEV (NEEDLE) ×3 IMPLANT
NS IRRIG 1000ML POUR BTL (IV SOLUTION) ×3 IMPLANT
PACK SURGICAL SETUP 50X90 (CUSTOM PROCEDURE TRAY) ×3 IMPLANT
PAD ARMBOARD 7.5X6 YLW CONV (MISCELLANEOUS) ×3 IMPLANT
PENCIL BUTTON HOLSTER BLD 10FT (ELECTRODE) ×3 IMPLANT
SUT MON AB 4-0 PC3 18 (SUTURE) ×3 IMPLANT
SUT PROLENE 2 0 SH DA (SUTURE) ×6 IMPLANT
SUT VIC AB 3-0 SH 27 (SUTURE) ×2
SUT VIC AB 3-0 SH 27X BRD (SUTURE) ×1 IMPLANT
SYR 20ML ECCENTRIC (SYRINGE) ×6 IMPLANT
SYR 5ML LUER SLIP (SYRINGE) ×3 IMPLANT
SYR CONTROL 10ML LL (SYRINGE) ×3 IMPLANT
TOWEL OR 17X26 10 PK STRL BLUE (TOWEL DISPOSABLE) ×3 IMPLANT
YANKAUER SUCT BULB TIP NO VENT (SUCTIONS) IMPLANT

## 2014-08-03 NOTE — Discharge Instructions (Signed)
[facility] °Office Phone Number [phone] ° ° POST OP INSTRUCTIONS ° °Always review your discharge instruction sheet given to you by the facility where your surgery was performed. ° °IF YOU HAVE DISABILITY OR FAMILY LEAVE FORMS, YOU MUST BRING THEM TO THE OFFICE FOR PROCESSING.  DO NOT GIVE THEM TO YOUR DOCTOR. ° °1. A prescription for pain medication may be given to you upon discharge.  Take your pain medication as prescribed, if needed.  If narcotic pain medicine is not needed, then you may take acetaminophen (Tylenol) or ibuprofen (Advil) as needed. °2. Take your usually prescribed medications unless otherwise directed °3. If you need a refill on your pain medication, please contact your pharmacy.  They will contact our office to request authorization.  Prescriptions will not be filled after 5pm or on week-ends. °4. You should eat very light the first 24 hours after surgery, such as soup, crackers, pudding, etc.  Resume your normal diet the day after surgery °5. It is common to experience some constipation if taking pain medication after surgery.  Increasing fluid intake and taking a stool softener will usually help or prevent this problem from occurring.  A mild laxative (Milk of Magnesia or Miralax) should be taken according to package directions if there are no bowel movements after 48 hours. °6. You may shower in 48 hours.  The surgical glue will flake off in 2-3 weeks.   °7. ACTIVITIES:  No strenuous activity or heavy lifting for 1 week.   °a. You may drive when you no longer are taking prescription pain medication, you can comfortably wear a seatbelt, and you can safely maneuver your car and apply brakes. °b. RETURN TO WORK:  __________to be determined._______________ °You should see your doctor in the office for a follow-up appointment approximately three-four weeks after your surgery.   ° °WHEN TO CALL YOUR DOCTOR: °1. Fever over 101.0 °2. Nausea and/or vomiting. °3. Extreme swelling  or bruising. °4. Continued bleeding from incision. °5. Increased pain, redness, or drainage from the incision. ° °The clinic staff is available to answer your questions during regular business hours.  Please don’t hesitate to call and ask to speak to one of the nurses for clinical concerns.  If you have a medical emergency, go to the nearest emergency room or call 911.  A surgeon from [facility] is always on call at the hospital. ° °For further questions, please visit [email or web address]  ° °

## 2014-08-03 NOTE — Anesthesia Postprocedure Evaluation (Signed)
Anesthesia Post Note  Patient: Michelle Erickson  Procedure(s) Performed: Procedure(s) (LRB): INSERTION PORT-A-CATH (N/A)  Anesthesia type: General  Patient location: PACU  Post pain: Pain level controlled and Adequate analgesia  Post assessment: Post-op Vital signs reviewed, Patient's Cardiovascular Status Stable, Respiratory Function Stable, Patent Airway and Pain level controlled  Last Vitals:  Filed Vitals:   08/03/14 1315  BP: 151/71  Pulse: 86  Temp:   Resp: 17    Post vital signs: Reviewed and stable  Level of consciousness: awake, alert  and oriented  Complications: No apparent anesthesia complications

## 2014-08-03 NOTE — Op Note (Signed)
PREOPERATIVE DIAGNOSIS:  Stage IV colon cancer     POSTOPERATIVE DIAGNOSIS:  Same     PROCEDURE: left subclavian port placement, Bard ClearVue  Power Port, MRI safe, 8-French.      SURGEON:  Stark Klein, MD      ANESTHESIA:  General   FINDINGS:  Good venous return, easy flush, and tip of the catheter and   SVC 22 cm.      SPECIMEN:  None.      ESTIMATED BLOOD LOSS:  Minimal.      COMPLICATIONS:  None known.      PROCEDURE:  Pt was identified in the holding area and taken to   the operating room, where patient was placed supine on the operating room   table.  General anesthesia was induced.  Patient's arms were tucked and the upper   chest and neck were prepped and draped in sterile fashion.  Time-out was   performed according to the surgical safety check list.  When all was   correct, we continued.   Local anesthetic was administered over this   area at the angle of the clavicle.  The vein was accessed with 2 passes of the needle. There was good venous return and the wire passed easily with no ectopy.   Fluoroscopy was used to confirm that the wire was in the vena cava.      The patient was placed back level and the area for the pocket was anethetized   with local anesthetic.  A 3-cm transverse incision was made with a #15   blade.  Cautery was used to divide the subcutaneous tissues down to the   pectoralis muscle.  An [employer] retractor was used to elevate the skin   while a pocket was created on top of the pectoralis fascia.  The port   was placed into the pocket to confirm that it was of adequate size.  The   catheter was preattached to the port.  The port was then secured to the   pectoralis fascia with four 2-0 Prolene sutures.  These were clamped and   not tied down yet.    The catheter was tunneled through to the wire exit   site.  The catheter was placed along the wire to determine what length it should be to be in the SVC.  The catheter was cut at 22 cm.  The  tunneler sheath and dilator were passed over the wire and the dilator and wire were removed.  The catheter was advanced through the tunneler sheath and the tunneler sheath was pulled away.  Care was taken to keep the catheter in the tunneler sheath as this occurred. This was advanced and the tunneler sheath was removed.  There was good venous   return and easy flush of the catheter.  The Prolene sutures were tied   down to the pectoral fascia.  The skin was reapproximated using 3-0   Vicryl interrupted deep dermal sutures.    Fluoroscopy was used to re-confirm good position of the catheter.  The skin   was then closed using 4-0 Monocryl in a subcuticular fashion.  The port was flushed with concentrated heparin flush as well.  The wounds were then cleaned, dried, and dressed with Dermabond.  The patient was awakened from anesthesia and taken to the PACU in stable condition.  Needle, sponge, and instrument counts were correct.               Stark Klein, MD

## 2014-08-03 NOTE — H&P (View-Only) (Signed)
Michelle Erickson 07/21/2014 10:12 AM Location: [facility] Patient #: [id number] DOB: 08/12/1955 Undefined / Language: Michelle Erickson / Race: Undefined Female  History of Present Illness Stark Klein MD; 07/21/2014 10:55 AM) The patient is a 59 year old female who presents with colorectal cancer. Patient is a 59 year old female referred by Dr. Alvino Chapel from the emergency department for a new presumed diagnosis of metastatic colon cancer. She presented to the emergency department with nausea vomiting and fatigue. A CT scan was performed and demonstrated thickening in the sigmoid colon as well as diffuse hepatic metastases. She described the pain as going on for 6 months. She lost between 15 and 20 pounds. Pain is described as dull and in the left upper quadrant. She had postprandial nausea and vomiting. Nothing that she took from home such as Tylenol helped the pain. She was also noted to be anemic in the emergency department and had elevated LFTs. Patient states that since she was given Zofran in the emergency department along with a prescription for this, she has been doing much better in terms of her symptoms. She denies any melena. She has not ever had a colonoscopy. She has had 2 sisters that died from colon cancer. She denies any other extended family with malignancy.   Other Problems Stark Klein, MD; 07/21/2014 10:55 AM) Back Pain Bladder Problems Diabetes Mellitus High blood pressure Hypercholesterolemia  Past Surgical History Stark Klein, MD; 07/21/2014 10:55 AM) Cataract Surgery Bilateral. Oral Surgery  Diagnostic Studies History Stark Klein, MD; 07/21/2014 10:55 AM) Colonoscopy never Mammogram >3 years ago Pap Smear 1-5 years ago  Social History Stark Klein, MD; 07/21/2014 10:55 AM) No alcohol use No caffeine use No drug use Tobacco use Former smoker.  Family History Stark Klein, MD; 07/21/2014 10:55 AM) Arthritis Sister. Breast Cancer Family Members In  General. Colon Cancer Sister. Colon Polyps Sister. Depression Sister. Diabetes Mellitus Sister. Respiratory Condition Father. Seizure disorder Brother.  Pregnancy / Birth History Stark Klein, MD; 07/21/2014 10:55 AM) Age at menarche 29 years. Age of menopause 51-55 Contraceptive History Oral contraceptives. Gravida 2 Irregular periods Maternal age 71-20 Para 2  Review of Systems Stark Klein MD; 07/21/2014 10:55 AM) General Present- Weight Loss. Not Present- Appetite Loss, Chills, Fatigue, Fever, Night Sweats and Weight Gain. Skin Not Present- Change in Wart/Mole, Dryness, Hives, Jaundice, New Lesions, Non-Healing Wounds, Rash and Ulcer. HEENT Present- Wears glasses/contact lenses. Not Present- Earache, Hearing Loss, Hoarseness, Nose Bleed, Oral Ulcers, Ringing in the Ears, Seasonal Allergies, Sinus Pain, Sore Throat, Visual Disturbances and Yellow Eyes. Respiratory Present- Chronic Cough and Snoring. Not Present- Bloody sputum, Difficulty Breathing and Wheezing. Breast Not Present- Breast Mass, Breast Pain, Nipple Discharge and Skin Changes. Cardiovascular Not Present- Chest Pain, Difficulty Breathing Lying Down, Leg Cramps, Palpitations, Rapid Heart Rate, Shortness of Breath and Swelling of Extremities. Gastrointestinal Present- Abdominal Pain and Nausea. Not Present- Bloating, Bloody Stool, Change in Bowel Habits, Chronic diarrhea, Constipation, Difficulty Swallowing, Excessive gas, Gets full quickly at meals, Hemorrhoids, Indigestion, Rectal Pain and Vomiting. Female Genitourinary Not Present- Frequency, Nocturia, Painful Urination, Pelvic Pain and Urgency. Musculoskeletal Not Present- Back Pain, Joint Pain, Joint Stiffness, Muscle Pain, Muscle Weakness and Swelling of Extremities. Neurological Not Present- Decreased Memory, Fainting, Headaches, Numbness, Seizures, Tingling, Tremor, Trouble walking and Weakness. Psychiatric Not Present- Anxiety, Bipolar, Change in Sleep  Pattern, Depression, Fearful and Frequent crying. Endocrine Present- Hot flashes. Not Present- Cold Intolerance, Excessive Hunger, Hair Changes, Heat Intolerance and New Diabetes. Hematology Not Present- Easy Bruising, Excessive bleeding, Gland problems,  HIV and Persistent Infections.   Physical Exam Stark Klein MD; 07/21/2014 10:55 AM) General Mental Status-Alert. General Appearance-Consistent with stated age. Hydration-Well hydrated. Voice-Normal.  Head and Neck Head-normocephalic, atraumatic with no lesions or palpable masses. Trachea-midline. Thyroid Gland Characteristics - normal size and consistency.  Eye Eyeball - Bilateral-Extraocular movements intact. Sclera/Conjunctiva - Bilateral-No scleral icterus.  Chest and Lung Exam Chest and lung exam reveals -quiet, even and easy respiratory effort with no use of accessory muscles and on auscultation, normal breath sounds, no adventitious sounds and normal vocal resonance. Inspection Chest Wall - Normal. Back - normal.  Cardiovascular Cardiovascular examination reveals -normal heart sounds, regular rate and rhythm with no murmurs and normal pedal pulses bilaterally.  Abdomen Inspection Inspection of the abdomen reveals - No Hernias. Palpation/Percussion Palpation and Percussion of the abdomen reveal - Soft, Non Tender, No Rebound tenderness, No Rigidity (guarding) and No hepatosplenomegaly. Auscultation Auscultation of the abdomen reveals - Bowel sounds normal.  Neurologic Neurologic evaluation reveals -alert and oriented x 3 with no impairment of recent or remote memory. Mental Status-Normal.  Musculoskeletal Global Assessment -Note: no gross deformities.  Normal Exam - Left-Upper Extremity Strength Normal and Lower Extremity Strength Normal. Normal Exam - Right-Upper Extremity Strength Normal and Lower Extremity Strength Normal.  Lymphatic Head & Neck  General Head & Neck Lymphatics:  Bilateral - Description - Normal. Axillary  General Axillary Region: Bilateral - Description - Normal. Tenderness - Non Tender. Femoral & Inguinal  Generalized Femoral & Inguinal Lymphatics: Bilateral - Description - No Generalized lymphadenopathy.    Assessment & Plan Stark Klein MD; 07/21/2014 11:00 AM) METASTATIC COLON CANCER TO LIVER (153.9  C18.9) Impression: The patient was seen in conjunction with medical oncology.  The patient is going to get a CT scan of the chest for complete staging. Also, a liver biopsy has been ordered. We will get a CEA level.  I reviewed Port-A-Cath placement with the patient. I discussed the procedure as well as risks and benefits. I discussed the risk of bleeding, infection, malposition, malfunction, possible need for additional surgery, possible need for a chest tube for a pneumothorax.  I discussed that at this juncture, we do not know how she would respond to chemotherapy.  It is unlikely that she would ever be a candidate for resection of the primary and mushy were to have a perforation or obstruction. Her liver disease is not resectable.  However, if she were to have an extremely good response to chemotherapy and have this be prolonged, it may be that she may present for resection of the primary if she has ongoing symptoms.  She will need to see genetics and have genetic testing. She is going to see Dr. Olevia Perches of GI this afternoon. 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 FAMILY HISTORY OF COLON CANCER (V16.0  Z80.0) Impression: Referral to genetics. Current Plans  Pt Education - ccs port insertion education   Signed by Stark Klein, MD (07/21/2014 11:01 AM)

## 2014-08-03 NOTE — Anesthesia Preprocedure Evaluation (Signed)
Anesthesia Evaluation  Patient identified by MRN, date of birth, ID band Patient awake    Reviewed: Allergy & Precautions, NPO status , Patient's Chart, lab work & pertinent test results  Airway Mallampati: II   Neck ROM: full    Dental   Pulmonary former smoker,  breath sounds clear to auscultation        Cardiovascular hypertension, Rhythm:regular Rate:Normal     Neuro/Psych    GI/Hepatic Metastatic colon CA.   Endo/Other  diabetes, Type 2, Insulin Dependent  Renal/GU      Musculoskeletal   Abdominal   Peds  Hematology   Anesthesia Other Findings   Reproductive/Obstetrics                             Anesthesia Physical Anesthesia Plan  ASA: II  Anesthesia Plan: General   Post-op Pain Management:    Induction: Intravenous  Airway Management Planned: LMA  Additional Equipment:   Intra-op Plan:   Post-operative Plan:   Informed Consent: I have reviewed the patients History and Physical, chart, labs and discussed the procedure including the risks, benefits and alternatives for the proposed anesthesia with the patient or authorized representative who has indicated his/her understanding and acceptance.     Plan Discussed with: CRNA, Anesthesiologist and Surgeon  Anesthesia Plan Comments:         Anesthesia Quick Evaluation

## 2014-08-03 NOTE — Interval H&P Note (Signed)
History and Physical Interval Note:  08/03/2014 11:05 AM  Michelle Erickson  has presented today for surgery, with the diagnosis of METASTATIC COLON CANCER  The various methods of treatment have been discussed with the patient and family. After consideration of risks, benefits and other options for treatment, the patient has consented to  Procedure(s): INSERTION PORT-A-CATH (N/A) as a surgical intervention .  The patient's history has been reviewed, patient examined, no change in status, stable for surgery.  I have reviewed the patient's chart and labs.  Questions were answered to the patient's satisfaction.     [name]

## 2014-08-03 NOTE — Transfer of Care (Signed)
Immediate Anesthesia Transfer of Care Note  Patient: Michelle Erickson  Procedure(s) Performed: Procedure(s): INSERTION PORT-A-CATH (N/A)  Patient Location: PACU  Anesthesia Type:General  Level of Consciousness: awake  Airway & Oxygen Therapy: Patient Spontanous Breathing and Patient connected to face mask oxygen  Post-op Assessment: Report given to RN and Post -op Vital signs reviewed and stable  Post vital signs: Reviewed and stable  Last Vitals:  Filed Vitals:   08/03/14 1011  BP: 167/69  Pulse: 93  Temp: 36.6 C  Resp: 18    Complications: No apparent anesthesia complications

## 2014-08-03 NOTE — Anesthesia Procedure Notes (Signed)
Procedure Name: LMA Insertion Date/Time: 08/03/2014 11:48 AM Performed by: Maude Leriche D Pre-anesthesia Checklist: Patient identified, Emergency Drugs available, Suction available, Patient being monitored and Timeout performed Patient Re-evaluated:Patient Re-evaluated prior to inductionOxygen Delivery Method: Circle system utilized Preoxygenation: Pre-oxygenation with 100% oxygen Intubation Type: IV induction Ventilation: Mask ventilation without difficulty LMA: LMA inserted LMA Size: 4.0 Number of attempts: 1 Placement Confirmation: positive ETCO2 and breath sounds checked- equal and bilateral Tube secured with: Tape Dental Injury: Teeth and Oropharynx as per pre-operative assessment

## 2014-08-04 ENCOUNTER — Encounter (HOSPITAL_COMMUNITY): Payer: Self-pay | Admitting: General Surgery

## 2014-08-08 ENCOUNTER — Telehealth: Payer: Self-pay | Admitting: Hematology

## 2014-08-08 ENCOUNTER — Encounter: Payer: Self-pay | Admitting: Hematology

## 2014-08-08 ENCOUNTER — Encounter: Payer: Self-pay | Admitting: *Deleted

## 2014-08-08 ENCOUNTER — Ambulatory Visit (HOSPITAL_BASED_OUTPATIENT_CLINIC_OR_DEPARTMENT_OTHER): Payer: BLUE CROSS/BLUE SHIELD | Admitting: Hematology

## 2014-08-08 ENCOUNTER — Other Ambulatory Visit: Payer: BLUE CROSS/BLUE SHIELD

## 2014-08-08 VITALS — BP 129/69 | HR 80 | Temp 98.6°F | Resp 18 | Ht 64.0 in | Wt 154.8 lb

## 2014-08-08 DIAGNOSIS — D500 Iron deficiency anemia secondary to blood loss (chronic): Secondary | ICD-10-CM | POA: Diagnosis not present

## 2014-08-08 DIAGNOSIS — E119 Type 2 diabetes mellitus without complications: Secondary | ICD-10-CM | POA: Diagnosis not present

## 2014-08-08 DIAGNOSIS — C187 Malignant neoplasm of sigmoid colon: Secondary | ICD-10-CM | POA: Diagnosis not present

## 2014-08-08 DIAGNOSIS — C787 Secondary malignant neoplasm of liver and intrahepatic bile duct: Secondary | ICD-10-CM | POA: Diagnosis not present

## 2014-08-08 DIAGNOSIS — I10 Essential (primary) hypertension: Secondary | ICD-10-CM

## 2014-08-08 DIAGNOSIS — C189 Malignant neoplasm of colon, unspecified: Secondary | ICD-10-CM

## 2014-08-08 MED ORDER — LIDOCAINE-PRILOCAINE 2.5-2.5 % EX CREA: TOPICAL_CREAM | CUTANEOUS | Status: DC

## 2014-08-08 MED ORDER — ONDANSETRON HCL 8 MG PO TABS: 8.0000 mg | ORAL_TABLET | Freq: Two times a day (BID) | ORAL | Status: DC

## 2014-08-08 NOTE — Progress Notes (Signed)
[place]  [phone] Fax:([phone] [phone] Note   Patient Care Team: Dione Housekeeper, MD as PCP - General (Family Medicine) Tania Ade, RN as Registered Nurse (Oncology) 08/08/2014  CHIEF COMPLAINTS:  Follow-up of metastatic colon cancer  Oncology History   Metastatic colon cancer to liver   Staging form: Colon and Rectum, AJCC 7th Edition     Clinical: Stage Unknown ([place], NX, M1) - Unsigned        Metastatic colon cancer to liver   07/14/2014 Imaging CT abdomen and pelvis with contrast showed a 6 cm segment of sigmoid bowel wall thickening with adjacent 14 x 12 mm nodules, diffuse hepatic metastasis. No bowel obstruction.   07/14/2014 Initial Diagnosis Metastatic colon cancer to liver   07/25/2014 Procedure Colonoscopy showed a near circumferential medial mass in the sigmoid colon, partially obstructing consistent with carcinoma.   07/25/2014 Initial Biopsy Sigmoid: Biopsy showed invasive adenocarcinoma arising in a background of high-grade dysplasia.   07/28/2014 Imaging CT chest showed no evidence of metastasis    HISTORY OF PRESENTING ILLNESS:  Michelle Erickson 59 y.o. female is here because of abnoaml CT findings which is highly suspecious for metastatic colon cancer.   She presented intermittent nausea, anorexia and abdominal pain since Nov 2015, she has mild pain in the LUQ, crapy pain, positional (bending over),   it happened once every 2-3 weeks, and it has been more frequent in the past month. She has normal BM, but did notice the caliber of the stool is smaller than before. She denies any melena or hematochezia. She lost about 13 lbs in the past 6 months.   She presents to local emergency room in November 2015, and May 2016, was felt to be related to gastric virus. Due to the worsening symptoms lately, she went to [facility] ED on 07/13/2014, CT abdomen was obtained which showed  multiple large liver metastasis and probable sigmoid colon mass. She was referred to [place] for further workup. She is scheduled to see GI Dr. Olevia Perches this afternoon.  INTERIM HISTORY [name] returns for follow-up and chemotherapy class today. She presents to the clinic with her husband. She underwent colonoscopy by Dr. Olevia Perches on dry fifths, a near circumferential medial mass in the sigmoid colon was found, which cause partial obstruction. The biopsy showed adenocarcinoma. She also had a port placed last week. She missed her appointment for liver biopsy last week, and we rescheduled her for this Friday. She seems to be depressed and overwhelmed by the cancer diagnosis, denies suicidal, and expressed her wishes to get treatment.   MEDICAL HISTORY:  Past Medical History  Diagnosis Date  . Hyperlipemia   . Diabetes mellitus without complication   . Hypertension   . Metastatic colon cancer to liver 07/21/2014  . Wears glasses     contacts/glasses  . Snoring   . Back pain     SURGICAL HISTORY: Past Surgical History  Procedure Laterality Date  . Mouth surgery    . Cataract extraction Bilateral   . Portacath placement N/A 08/03/2014    Procedure: INSERTION PORT-A-CATH;  Surgeon: Stark Klein, MD;  Location: [place];  Service: General;  Laterality: N/A;    SOCIAL HISTORY: History   Social History  . Marital Status: Married    Spouse Name: N/A  . Number of Children: N/A  . Years of Education: N/A   Occupational History  . Not on file.   Social History Main Topics  .  Smoking status: Former Smoker -- 0.50 packs/day for 6 years    Quit date: 01/21/1976  . Smokeless tobacco: Not on file  . Alcohol Use: No  . Drug Use: No  . Sexual Activity: Yes    Birth Control/ Protection: Post-menopausal     Comment: # 2 pregnancies and #2 live births   Other Topics Concern  . Not on file   Social History Narrative   Married, husband [name]   Has #2 adult children   Previously worked at [employer] HISTORY: Family History  Problem Relation Age of Onset  . COPD Father   . Arthritis Sister   . Diabetes Sister   . Colon cancer Sister   . Colon polyps Sister   . Seizures Brother   . Colon cancer Sister     ALLERGIES:  has No Known Allergies.  MEDICATIONS:  Current Outpatient Prescriptions  Medication Sig Dispense Refill  . losartan (COZAAR) 50 MG tablet Take 50 mg by mouth.    Marland Kitchen amLODipine (NORVASC) 5 MG tablet Take 5 mg by mouth daily.    Marland Kitchen atorvastatin (LIPITOR) 40 MG tablet Take 40 mg by mouth daily.    . ferrous sulfate 325 (65 FE) MG tablet Take 325 mg by mouth daily with breakfast.    . insulin glargine (LANTUS) 100 UNIT/ML injection Inject 28 Units into the skin at bedtime.    . insulin lispro (HUMALOG) 100 UNIT/ML injection Inject 4-8 Units into the skin 2 (two) times daily as needed for high blood sugar. Sliding scale    . lidocaine-prilocaine (EMLA) cream Apply to affected area once 30 g 3  . ondansetron (ZOFRAN) 8 MG tablet Take 1 tablet (8 mg total) by mouth 2 (two) times daily. Start the day after chemo for 2 days. Then take as needed for nausea or vomiting. 30 tablet 1  . ondansetron (ZOFRAN-ODT) 8 MG disintegrating tablet Take 8 mg by mouth every 8 (eight) hours as needed for nausea or vomiting.    Marland Kitchen oxyCODONE-acetaminophen (ROXICET) 5-325 MG per tablet Take 1-2 tablets by mouth every 4 (four) hours as needed for severe pain. 30 tablet 0   No current facility-administered medications for this visit.    REVIEW OF SYSTEMS:   Constitutional: Denies fevers, chills or abnormal night sweats Eyes: Denies blurriness of vision, double vision or watery eyes Ears, nose, mouth, throat, and face: Denies mucositis or sore throat Respiratory: Denies cough, dyspnea or wheezes Cardiovascular: Denies palpitation, chest discomfort or lower extremity swelling Gastrointestinal:  Denies nausea, heartburn or change in bowel habits Skin: Denies abnormal skin  rashes Lymphatics: Denies new lymphadenopathy or easy bruising Neurological:Denies numbness, tingling or new weaknesses Behavioral/Psych: Mood is stable, no new changes  All other systems were reviewed with the patient and are negative.  PHYSICAL EXAMINATION: ECOG PERFORMANCE STATUS: 1 - Symptomatic but completely ambulatory  Filed Vitals:   08/08/14 1156  BP: 129/69  Pulse: 80  Temp: 98.6 F (37 C)  Resp: 18   Filed Weights   08/08/14 1156  Weight: 154 lb 12.8 oz (70.217 kg)    GENERAL:alert, no distress and comfortable SKIN: skin color, texture, turgor are normal, no rashes or significant lesions EYES: normal, conjunctiva are pink and non-injected, sclera clear OROPHARYNX:no exudate, no erythema and lips, buccal mucosa, and tongue normal  NECK: supple, thyroid normal size, non-tender, without nodularity LYMPH:  no palpable lymphadenopathy in the cervical, axillary or inguinal LUNGS: clear to auscultation and percussion  with normal breathing effort HEART: regular rate & rhythm and no murmurs and no lower extremity edema ABDOMEN:abdomen soft, non-tender and normal bowel sounds Musculoskeletal:no cyanosis of digits and no clubbing  PSYCH: alert & oriented x 3 with fluent speech NEURO: no focal motor/sensory deficits  LABORATORY DATA:  I have reviewed the data as listed CBC Latest Ref Rng 08/02/2014 07/21/2014 07/13/2014  WBC 4.0 - 10.5 K/uL 7.3 9.0 -  Hemoglobin 12.0 - 15.0 g/dL 9.8(L) 10.8(L) 12.9  Hematocrit 36.0 - 46.0 % 31.2(L) 33.9(L) 38.0  Platelets 150 - 400 K/uL 266 294 -     Recent Labs  05/21/14 0408 07/13/14 2051 07/13/14 2058 07/21/14 1050 08/02/14 1348  NA 137  --  134* 141 139  K 4.2  --  4.2 4.5 4.7  CL 103  --  100*  --  107  CO2 26  --   --  25 25  GLUCOSE 116*  --  94 99 147*  BUN 17  --  19 14.8 13  CREATININE 1.02*  --  1.00 1.1 1.08*  CALCIUM 9.2  --   --  10.1 9.4  GFRNONAA 58*  --   --   --  54*  GFRAA >60  --   --   --  >60  PROT 7.8  7.6  --  7.7  --   ALBUMIN 4.2 3.8  --  3.8  --   AST 59* 65*  --  64*  --   ALT 50 54  --  56*  --   ALKPHOS 107 146*  --  179*  --   BILITOT 0.5 0.9  --  0.36  --   BILIDIR  --  0.2  --   --   --   IBILI  --  0.7  --   --   --    CEA  Status: Finalresult Visible to patient:  Not Released Nextappt: 08/09/2014 at 10:00 AM in Oncology Florene Glen, Emi [place], [place]) Dx:  Metastatic colon cancer to liver            Ref Range 2wk ago    CEA 0.0 - 5.0 ng/mL 594.5 (H)         PATHOLOGY REPORTS: Diagnosis 07/25/2014  Surgical [P], sigmoid - INVASIVE ADENOCARCINOMA ARISING IN A BACKGROUND OF HIGH GRADE DYSPLASIA. - SEE COMMENT. Microscopic Comment Results are phoned to Dr. Olevia Perches. Internal departmental review obtained (Dr. Donato Heinz) with agreement.  RADIOGRAPHIC STUDIES: I have personally reviewed the radiological images as listed and agreed with the findings in the report.  Ct Abdomen Pelvis W Contrast 07/14/2014   IMPRESSION: 6 cm segment of sigmoid bowel wall thickening concerning for neoplasm with adjacent 14 x 12 mm nodule which may reflect direct tumoral extension or nodal metastasis. Diffuse hepatic metastasis. No bowel obstruction. Recommend follow-up sigmoidoscopy/colonoscopy on a nonemergent basis.  Mild bladder wall thickening could reflect underdistention or cystitis.  Acute findings discussed with and reconfirmed by Dr.NATHAN PICKERING on 07/14/2014 at 1:36 am.   Electronically Signed   By: Elon Alas M.D.   On: 07/14/2014 01:36   CT chest with contrast 07/28/2014 IMPRESSION: 1. No evidence for metastatic disease in the chest. 2. Bulky metastatic disease in the liver as seen on the previous abdomen and pelvis CT scan. Visualized hepatic lesions show no substantial interval change.  Colonoscopy 07/25/2014 Dr. Olevia Perches  ENDOSCOPIC IMPRESSION: Near circumferential medium mass was found in the sigmoid colon; multiple biopsies were performed using cold  forceps, partially obstructing  mass consistent with carcinoma. Placement of endoclips and tattoo at the margins of the mass which extends from 18-25 cm from the anus  ASSESSMENT & PLAN: 59 year old African-American female, presented with intermittent nausea vomiting and anemia.   1. Sigmoid colon cancer with liver metastasis, [id number], stage IV -I reviewed her CT scans, colonoscopy, and colon mass biopsy findings with patient and her husband in great details, images were reviewed in person. -Her liver masses on the CT scan are highly suspicious for metastatic disease, her CEA level is very high, which supports metastatic colon cancer. -I recommend liver biopsy to confirm the metastasis and establish a diagnosis. Benefits and risks were discussed with patient, she agrees. She is scheduled for this Friday -The natural history of metastatic colon cancer and treatment options were discussed with patient and her husband. Giving the diffuse metastasis in the liver, this is unfortunately incurable or disease. I recommend systemic chemotherapy to control her metastatic disease, the goal of therapy is disease control and prolong her life. -The option of first-line chemotherapy were discussed with her, I recommend FOLFOX and Avastin every 2 weeks intravenously, and I will continue as long as she tolerates and is walking -Giving the upcoming liver biopsy, I'll hold Avastin for the first cycle -She is scheduled to start first cycle chemotherapy tomorrow. Lab before chemotherapy tomorrow -She does not have significant bleeding or obstruction from the sigmoid colon mass, I'll hold on cortisone surgery for no but we discussed the indication for colon surgery or radiation if she develops significant obstruction or bleeding.   -Giving her personal and family history of colon cancer, she is scheduled to see genetic counselor tomorrow   2. Anemia -Likely secondary to tumor bleeding and iron deficiency -Her ferritin is  normal, serum iron and saturation slightly low, TIBC low normal, I suggest her to take oral iron supplement.  3. DM, HTN -Continue follow-up with PCP  4. Depression -She appears depressed to me and we discussed the option of antidepressant medication. She declines at this point.  PLan -Genetic counseling tomorrow  -first cycle chemotherapy FOLFOX tomorrow  -Liver biopsy on Friday, July 22  -We'll see her back one week after her first chemotherapy   All questions were answered. The patient knows to call the clinic with any problems, questions or concerns. I spent 30 minutes counseling the patient face to face. The total time spent in the appointment was 40 minutes and more than 50% was on counseling.     Truitt Merle, MD 08/08/2014 1:15 PM

## 2014-08-08 NOTE — Telephone Encounter (Signed)
per pof to sch pt appt-sent MW email to sch pt trmt-pt aware of appts-gave pt copy of sch

## 2014-08-09 ENCOUNTER — Ambulatory Visit (HOSPITAL_BASED_OUTPATIENT_CLINIC_OR_DEPARTMENT_OTHER): Payer: BLUE CROSS/BLUE SHIELD

## 2014-08-09 ENCOUNTER — Other Ambulatory Visit (HOSPITAL_BASED_OUTPATIENT_CLINIC_OR_DEPARTMENT_OTHER): Payer: BLUE CROSS/BLUE SHIELD

## 2014-08-09 ENCOUNTER — Ambulatory Visit: Payer: BLUE CROSS/BLUE SHIELD | Admitting: Genetic Counselor

## 2014-08-09 ENCOUNTER — Encounter: Payer: Self-pay | Admitting: *Deleted

## 2014-08-09 ENCOUNTER — Encounter: Payer: Self-pay | Admitting: Genetic Counselor

## 2014-08-09 ENCOUNTER — Other Ambulatory Visit: Payer: Self-pay | Admitting: Hematology

## 2014-08-09 VITALS — BP 158/76 | HR 86 | Temp 98.2°F | Resp 17

## 2014-08-09 DIAGNOSIS — C787 Secondary malignant neoplasm of liver and intrahepatic bile duct: Secondary | ICD-10-CM

## 2014-08-09 DIAGNOSIS — C189 Malignant neoplasm of colon, unspecified: Secondary | ICD-10-CM

## 2014-08-09 DIAGNOSIS — E119 Type 2 diabetes mellitus without complications: Secondary | ICD-10-CM

## 2014-08-09 DIAGNOSIS — Z5111 Encounter for antineoplastic chemotherapy: Secondary | ICD-10-CM | POA: Diagnosis not present

## 2014-08-09 DIAGNOSIS — D500 Iron deficiency anemia secondary to blood loss (chronic): Secondary | ICD-10-CM

## 2014-08-09 DIAGNOSIS — Z800 Family history of malignant neoplasm of digestive organs: Secondary | ICD-10-CM

## 2014-08-09 DIAGNOSIS — C187 Malignant neoplasm of sigmoid colon: Secondary | ICD-10-CM

## 2014-08-09 LAB — COMPREHENSIVE METABOLIC PANEL (CC13)
ALT: 52 U/L (ref 0–55)
ANION GAP: 8 meq/L (ref 3–11)
AST: 44 U/L — ABNORMAL HIGH (ref 5–34)
Albumin: 3.5 g/dL (ref 3.5–5.0)
Alkaline Phosphatase: 213 U/L — ABNORMAL HIGH (ref 40–150)
BUN: 17.1 mg/dL (ref 7.0–26.0)
CO2: 26 mEq/L (ref 22–29)
Calcium: 9.3 mg/dL (ref 8.4–10.4)
Chloride: 102 mEq/L (ref 98–109)
Creatinine: 0.9 mg/dL (ref 0.6–1.1)
EGFR: 85 mL/min/{1.73_m2} — AB (ref >90)
Glucose: 181 mg/dl — ABNORMAL HIGH (ref 70–140)
Potassium: 4.5 mEq/L (ref 3.5–5.1)
SODIUM: 136 meq/L (ref 136–145)
Total Bilirubin: 0.36 mg/dL (ref 0.20–1.20)
Total Protein: 7.3 g/dL (ref 6.4–8.3)

## 2014-08-09 MED ORDER — DEXTROSE 5 % IV SOLN
Freq: Once | INTRAVENOUS | Status: AC
  Administered 2014-08-09: 13:00:00 via INTRAVENOUS

## 2014-08-09 MED ORDER — SODIUM CHLORIDE 0.9 % IV SOLN
2400.0000 mg/m2 | INTRAVENOUS | Status: DC
  Administered 2014-08-09: 4300 mg via INTRAVENOUS
  Filled 2014-08-09: qty 86

## 2014-08-09 MED ORDER — OXALIPLATIN CHEMO INJECTION 100 MG/20ML
85.0000 mg/m2 | Freq: Once | INTRAVENOUS | Status: AC
  Administered 2014-08-09: 150 mg via INTRAVENOUS
  Filled 2014-08-09: qty 30

## 2014-08-09 MED ORDER — DEXTROSE 5 % IV SOLN
402.0000 mg/m2 | Freq: Once | INTRAVENOUS | Status: AC
  Administered 2014-08-09: 720 mg via INTRAVENOUS
  Filled 2014-08-09: qty 36

## 2014-08-09 MED ORDER — HEPARIN SOD (PORK) LOCK FLUSH 100 UNIT/ML IV SOLN
500.0000 [IU] | Freq: Once | INTRAVENOUS | Status: DC | PRN
  Filled 2014-08-09: qty 5

## 2014-08-09 MED ORDER — FLUOROURACIL CHEMO INJECTION 2.5 GM/50ML
400.0000 mg/m2 | Freq: Once | INTRAVENOUS | Status: AC
  Administered 2014-08-09: 700 mg via INTRAVENOUS
  Filled 2014-08-09: qty 14

## 2014-08-09 MED ORDER — SODIUM CHLORIDE 0.9 % IV SOLN
Freq: Once | INTRAVENOUS | Status: AC
  Administered 2014-08-09: 14:00:00 via INTRAVENOUS
  Filled 2014-08-09: qty 4

## 2014-08-09 MED ORDER — SODIUM CHLORIDE 0.9 % IJ SOLN
10.0000 mL | INTRAMUSCULAR | Status: DC | PRN
  Filled 2014-08-09: qty 10

## 2014-08-09 NOTE — Progress Notes (Signed)
17:15 - Educated pt on potential for chemo spill with home infusion pump.  Pt given chemo spill kit and sharps container in the event this happens and educated on what to do.  Pt and pt family report understanding.  Told pt to return for pump stop at 15:00 on 7/22, pt stated she had liver biopsy scheduled that day for 1400.  I spoke with Dr. Julien Nordmann as Dr. Burr Medico had already left and Dr. Julien Nordmann recommended having the pt report to liver biopsy as scheduled and then coming to cancer center for pump d/c.  I will leave a note with Dr. Ernestina Penna RN tomorrow 7/21 to confirm this would be her wishes and pt informed she would receive a call if anything changes with her plan.  Discussed with pt and family the importance of notifying us if she has any side effects or questions regarding new treatment.  No further questions at time of d/c and pt discharged ambulatory with daughter and spouse.

## 2014-08-09 NOTE — Progress Notes (Signed)
REFERRING PROVIDER: Dione Housekeeper, MD [place], [place] [id number]   Truitt Merle, MD  PRIMARY PROVIDER:  Sherrie Mustache, MD  PRIMARY REASON FOR VISIT:  1. Metastatic colon cancer to liver   2. Family history of colon cancer      HISTORY OF PRESENT ILLNESS:   Michelle Erickson, a 59 y.o. female, was seen for a [place] cancer genetics consultation at the request of Dr. Burr Medico due to a personal and family history of colon  cancer.  Michelle Erickson presents to clinic today to discuss the possibility of a hereditary predisposition to cancer, genetic testing, and to further clarify her future cancer risks, as well as potential cancer risks for family members.   In June, at the age of 18, Michelle Erickson was diagnosed with metastatic colon cancer. She went to the hospital in [place] in November 2015 for vomiting and abdominal pain and was told she had a sinus infection.  In the spring she went to [facility] with similar symptoms and was told she had a virus.  She was admitted to the hospital in June and had a CT scan which diagnosed the colon mass.  Michelle Erickson has a port and is starting chemotherapy today.   CANCER HISTORY:  Oncology History   Metastatic colon cancer to liver   Staging form: Colon and Rectum, AJCC 7th Edition     Clinical: Stage Unknown ([place], NX, M1) - Unsigned        Metastatic colon cancer to liver   07/14/2014 Imaging CT abdomen and pelvis with contrast showed a 6 cm segment of sigmoid bowel wall thickening with adjacent 14 x 12 mm nodules, diffuse hepatic metastasis. No bowel obstruction.   07/14/2014 Initial Diagnosis Metastatic colon cancer to liver   07/25/2014 Procedure Colonoscopy showed a near circumferential medial mass in the sigmoid colon, partially obstructing consistent with carcinoma.   07/25/2014 Initial Biopsy Sigmoid: Biopsy showed invasive adenocarcinoma arising in a background of high-grade dysplasia.   07/28/2014 Imaging CT chest showed no evidence of metastasis      HORMONAL RISK FACTORS:  Menarche was at age 1.  First live birth at age 69.  Ovaries intact: yes.  Hysterectomy: no.  Menopausal status: postmenopausal.  Colonoscopy: no; not examined. Mammogram within the last year: patient claims it has been several years since her last mammogram. Up to date with pelvic exams:  yes. Any excessive radiation exposure in the past:  no  Past Medical History  Diagnosis Date  . Hyperlipemia   . Diabetes mellitus without complication   . Hypertension   . Metastatic colon cancer to liver 07/21/2014  . Wears glasses     contacts/glasses  . Snoring   . Back pain   . Family history of colon cancer     Past Surgical History  Procedure Laterality Date  . Mouth surgery    . Cataract extraction Bilateral   . Portacath placement N/A 08/03/2014    Procedure: INSERTION PORT-A-CATH;  Surgeon: Stark Klein, MD;  Location: [place];  Service: General;  Laterality: N/A;    History   Social History  . Marital Status: Married    Spouse Name: Charlotte Crumb  . Number of Children: 2  . Years of Education: N/A   Social History Main Topics  . Smoking status: Former Smoker -- 1.00 packs/day for 6 years    Types: Cigarettes    Quit date: 01/21/1976  . Smokeless tobacco: Not on file  . Alcohol Use: No  . Drug  Use: No  . Sexual Activity: Yes    Birth Control/ Protection: Post-menopausal     Comment: # 2 pregnancies and #2 live births   Other Topics Concern  . None   Social History Narrative   Married, husband [name]   Has #2 adult children   Previously worked at [employer]:  We obtained a detailed, 4-generation family history.  Significant diagnoses are listed below: Family History  Problem Relation Age of Onset  . COPD Father   . Heart attack Father   . Arthritis Sister   . Diabetes Sister   . Colon cancer Sister     dx over 38  . Colon polyps Sister   . Seizures Brother   . Colon cancer Sister     dx in her early  73s, died in her early to mid 56s  . Diabetes Mother    The patient and her identical twin sister are the youngest of 50 children.  Two sisters have died of heart attacks, two died of colon cancer and three sisters, including Michelle Erickson's twin, are alive and well.  Three brothers are alive and well.  Michelle Erickson mother died at 91 from a heart attack and diabetes.  She had two full brothers and a half sister who are now all deceased but did not have cancer that Michelle Erickson is aware of.  Michelle Erickson father had 11 siblings.  She is not aware of additional cancers at this time. Patient's maternal ancestors are of African American descent, and paternal ancestors are of [place] American and Caucasian descent. There is no reported Ashkenazi Jewish ancestry. There is no known consanguinity.  GENETIC COUNSELING ASSESSMENT: Michelle Erickson is a 59 y.o. 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 We, therefore, discussed and recommended the following at today's visit.   DISCUSSION: We reviewed the characteristics, features and inheritance patterns of hereditary cancer syndromes. We reviewed autosomal recessive inheritance and MYH polyposis.  We discussed that most cases would be diagnosed under 50, but based on the lack of cancer in previous generations, this would be most suggestive.  We also reviewed other hereditary cancer syndromes, such as Lynch syndrome.   We also discussed genetic testing, including the appropriate family members to test, the process of testing, insurance coverage and turn-around-time for results. We discussed the implications of a negative, positive and/or variant of uncertain significant result. We recommended Michelle Erickson pursue genetic testing for the Colorectal Cancer gene panel. The Colorectal Cancer Panel offered by  [facility] includes sequencing and/or duplication/deletion testing of the following 19 genes: APC, ATM, AXIN2, BMPR1A, CDH1, CHEK2, EPCAM, MLH1, MSH2, MSH6, MUTYH, PMS2, POLD1, POLE, PTEN, SCG5/GREM1, SMAD4, STK11, and TP53.     Based on Michelle Erickson's personal and family history of colon cancer, she meets medical criteria for genetic testing. Despite that she meets criteria, she may still have an out of pocket cost. We discussed that if her out of pocket cost for testing is over $100, the laboratory will call and confirm whether she wants to proceed with testing.  If the out of pocket cost of testing is less than $100 she will be billed by the genetic testing laboratory.   PLAN: After considering the risks, benefits, and limitations, Michelle Erickson  provided informed consent to pursue genetic testing  and the blood sample was sent to [facility] for analysis of the Colorectal Cancer Panel. Results should be available within approximately 2-3 weeks' time, at which point they will be disclosed by telephone to Michelle Erickson, as will any additional recommendations warranted by these results. Michelle Erickson will receive a summary of her genetic counseling visit and a copy of her results once available. This information will also be available in Epic. We encouraged Michelle Erickson to remain in contact with cancer genetics annually so that we can continuously update the family history and inform her of any changes in cancer genetics and testing that may be of benefit for her family. Michelle Erickson questions were answered to her satisfaction today. Our contact information was provided should additional questions or concerns arise.  Lastly, we encouraged Michelle Erickson to remain in contact with cancer genetics annually so that we can continuously update the family history and inform her of any changes in cancer genetics and testing that may be of benefit for this family.   Ms.  Erickson questions were answered to her satisfaction today. Our  contact information was provided should additional questions or concerns arise. Thank you for the referral and allowing [place] to share in the care of your patient.   Kersti Scavone P. Florene Glen, [place], [facility] Certified Genetic Counselor [email or web address] phone: [phone]  The patient was seen for a total of 45 minutes in face-to-face genetic counseling.  This patient was discussed with Drs. Magrinat, Lindi Adie and/or Burr Medico who agrees with the above.    _______________________________________________________________________ For Office Staff:  Number of people involved in session: 2 Was an Intern/ student involved with case: no

## 2014-08-09 NOTE — Patient Instructions (Signed)
Oxaliplatin Injection What is this medicine? OXALIPLATIN (ox AL i PLA tin) is a chemotherapy drug. It targets fast dividing cells, like cancer cells, and causes these cells to die. This medicine is used to treat cancers of the colon and rectum, and many other cancers. This medicine may be used for other purposes; ask your health care provider or pharmacist if you have questions. COMMON BRAND NAME(S): Eloxatin What should I tell my health care provider before I take this medicine? They need to know if you have any of these conditions: -kidney disease -an unusual or allergic reaction to oxaliplatin, other chemotherapy, other medicines, foods, dyes, or preservatives -pregnant or trying to get pregnant -breast-feeding How should I use this medicine? This drug is given as an infusion into a vein. It is administered in a hospital or clinic by a specially trained health care professional. Talk to your pediatrician regarding the use of this medicine in children. Special care may be needed. Overdosage: If you think you have taken too much of this medicine contact a poison control center or emergency room at once. NOTE: This medicine is only for you. Do not share this medicine with others. What if I miss a dose? It is important not to miss a dose. Call your doctor or health care professional if you are unable to keep an appointment. What may interact with this medicine? -medicines to increase blood counts like filgrastim, pegfilgrastim, sargramostim -probenecid -some antibiotics like amikacin, gentamicin, neomycin, polymyxin B, streptomycin, tobramycin -zalcitabine Talk to your doctor or health care professional before taking any of these medicines: -acetaminophen -aspirin -ibuprofen -ketoprofen -naproxen This list may not describe all possible interactions. Give your health care provider a list of all the medicines, herbs, non-prescription drugs, or dietary supplements you use. Also tell them if  you smoke, drink alcohol, or use illegal drugs. Some items may interact with your medicine. What should I watch for while using this medicine? Your condition will be monitored carefully while you are receiving this medicine. You will need important blood work done while you are taking this medicine. This medicine can make you more sensitive to cold. Do not drink cold drinks or use ice. Cover exposed skin before coming in contact with cold temperatures or cold objects. When out in cold weather wear warm clothing and cover your mouth and nose to warm the air that goes into your lungs. Tell your doctor if you get sensitive to the cold. This drug may make you feel generally unwell. This is not uncommon, as chemotherapy can affect healthy cells as well as cancer cells. Report any side effects. Continue your course of treatment even though you feel ill unless your doctor tells you to stop. In some cases, you may be given additional medicines to help with side effects. Follow all directions for their use. Call your doctor or health care professional for advice if you get a fever, chills or sore throat, or other symptoms of a cold or flu. Do not treat yourself. This drug decreases your body's ability to fight infections. Try to avoid being around people who are sick. This medicine may increase your risk to bruise or bleed. Call your doctor or health care professional if you notice any unusual bleeding. Be careful brushing and flossing your teeth or using a toothpick because you may get an infection or bleed more easily. If you have any dental work done, tell your dentist you are receiving this medicine. Avoid taking products that contain aspirin, acetaminophen, ibuprofen, naproxen,   or ketoprofen unless instructed by your doctor. These medicines may hide a fever. Do not become pregnant while taking this medicine. Women should inform their doctor if they wish to become pregnant or think they might be pregnant. There  is a potential for serious side effects to an unborn child. Talk to your health care professional or pharmacist for more information. Do not breast-feed an infant while taking this medicine. Call your doctor or health care professional if you get diarrhea. Do not treat yourself. What side effects may I notice from receiving this medicine? Side effects that you should report to your doctor or health care professional as soon as possible: -allergic reactions like skin rash, itching or hives, swelling of the face, lips, or tongue -low blood counts - This drug may decrease the number of white blood cells, red blood cells and platelets. You may be at increased risk for infections and bleeding. -signs of infection - fever or chills, cough, sore throat, pain or difficulty passing urine -signs of decreased platelets or bleeding - bruising, pinpoint red spots on the skin, black, tarry stools, nosebleeds -signs of decreased red blood cells - unusually weak or tired, fainting spells, lightheadedness -breathing problems -chest pain, pressure -cough -diarrhea -jaw tightness -mouth sores -nausea and vomiting -pain, swelling, redness or irritation at the injection site -pain, tingling, numbness in the hands or feet -problems with balance, talking, walking -redness, blistering, peeling or loosening of the skin, including inside the mouth -trouble passing urine or change in the amount of urine Side effects that usually do not require medical attention (report to your doctor or health care professional if they continue or are bothersome): -changes in vision -constipation -hair loss -loss of appetite -metallic taste in the mouth or changes in taste -stomach pain This list may not describe all possible side effects. Call your doctor for medical advice about side effects. You may report side effects to FDA at 1-800-FDA-1088. Where should I keep my medicine? This drug is given in a hospital or clinic and  will not be stored at home. NOTE: This sheet is a summary. It may not cover all possible information. If you have questions about this medicine, talk to your doctor, pharmacist, or health care provider.  2015, Elsevier/Gold Standard. (2007-08-03 17:22:47)  Leucovorin injection What is this medicine? LEUCOVORIN (loo koe VOR in) is used to prevent or treat the harmful effects of some medicines. This medicine is used to treat anemia caused by a low amount of folic acid in the body. It is also used with 5-fluorouracil (5-FU) to treat colon cancer. This medicine may be used for other purposes; ask your health care provider or pharmacist if you have questions. What should I tell my health care provider before I take this medicine? They need to know if you have any of these conditions: -anemia from low levels of vitamin B-12 in the blood -an unusual or allergic reaction to leucovorin, folic acid, other medicines, foods, dyes, or preservatives -pregnant or trying to get pregnant -breast-feeding How should I use this medicine? This medicine is for injection into a muscle or into a vein. It is given by a health care professional in a hospital or clinic setting. Talk to your pediatrician regarding the use of this medicine in children. Special care may be needed. Overdosage: If you think you have taken too much of this medicine contact a poison control center or emergency room at once. NOTE: This medicine is only for you. Do not share this  medicine with others. What if I miss a dose? This does not apply. What may interact with this medicine? -capecitabine -fluorouracil -phenobarbital -phenytoin -primidone -trimethoprim-sulfamethoxazole This list may not describe all possible interactions. Give your health care provider a list of all the medicines, herbs, non-prescription drugs, or dietary supplements you use. Also tell them if you smoke, drink alcohol, or use illegal drugs. Some items may interact  with your medicine. What should I watch for while using this medicine? Your condition will be monitored carefully while you are receiving this medicine. This medicine may increase the side effects of 5-fluorouracil, 5-FU. Tell your doctor or health care professional if you have diarrhea or mouth sores that do not get better or that get worse. What side effects may I notice from receiving this medicine? Side effects that you should report to your doctor or health care professional as soon as possible: -allergic reactions like skin rash, itching or hives, swelling of the face, lips, or tongue -breathing problems -fever, infection -mouth sores -unusual bleeding or bruising -unusually weak or tired Side effects that usually do not require medical attention (report to your doctor or health care professional if they continue or are bothersome): -constipation or diarrhea -loss of appetite -nausea, vomiting This list may not describe all possible side effects. Call your doctor for medical advice about side effects. You may report side effects to FDA at 1-800-FDA-1088. Where should I keep my medicine? This drug is given in a hospital or clinic and will not be stored at home. NOTE: This sheet is a summary. It may not cover all possible information. If you have questions about this medicine, talk to your doctor, pharmacist, or health care provider.  2015, Elsevier/Gold Standard. (2007-07-13 16:50:29)  Fluorouracil, 5-FU injection What is this medicine? FLUOROURACIL, 5-FU (flure oh YOOR a sil) is a chemotherapy drug. It slows the growth of cancer cells. This medicine is used to treat many types of cancer like breast cancer, colon or rectal cancer, pancreatic cancer, and stomach cancer. This medicine may be used for other purposes; ask your health care provider or pharmacist if you have questions. COMMON BRAND NAME(S): Adrucil What should I tell my health care provider before I take this  medicine? They need to know if you have any of these conditions: -blood disorders -dihydropyrimidine dehydrogenase (DPD) deficiency -infection (especially a virus infection such as chickenpox, cold sores, or herpes) -kidney disease -liver disease -malnourished, poor nutrition -recent or ongoing radiation therapy -an unusual or allergic reaction to fluorouracil, other chemotherapy, other medicines, foods, dyes, or preservatives -pregnant or trying to get pregnant -breast-feeding How should I use this medicine? This drug is given as an infusion or injection into a vein. It is administered in a hospital or clinic by a specially trained health care professional. Talk to your pediatrician regarding the use of this medicine in children. Special care may be needed. Overdosage: If you think you have taken too much of this medicine contact a poison control center or emergency room at once. NOTE: This medicine is only for you. Do not share this medicine with others. What if I miss a dose? It is important not to miss your dose. Call your doctor or health care professional if you are unable to keep an appointment. What may interact with this medicine? -allopurinol -cimetidine -dapsone -digoxin -hydroxyurea -leucovorin -levamisole -medicines for seizures like ethotoin, fosphenytoin, phenytoin -medicines to increase blood counts like filgrastim, pegfilgrastim, sargramostim -medicines that treat or prevent blood clots like warfarin, enoxaparin, and  dalteparin -methotrexate -metronidazole -pyrimethamine -some other chemotherapy drugs like busulfan, cisplatin, estramustine, vinblastine -trimethoprim -trimetrexate -vaccines Talk to your doctor or health care professional before taking any of these medicines: -acetaminophen -aspirin -ibuprofen -ketoprofen -naproxen This list may not describe all possible interactions. Give your health care provider a list of all the medicines, herbs,  non-prescription drugs, or dietary supplements you use. Also tell them if you smoke, drink alcohol, or use illegal drugs. Some items may interact with your medicine. What should I watch for while using this medicine? Visit your doctor for checks on your progress. This drug may make you feel generally unwell. This is not uncommon, as chemotherapy can affect healthy cells as well as cancer cells. Report any side effects. Continue your course of treatment even though you feel ill unless your doctor tells you to stop. In some cases, you may be given additional medicines to help with side effects. Follow all directions for their use. Call your doctor or health care professional for advice if you get a fever, chills or sore throat, or other symptoms of a cold or flu. Do not treat yourself. This drug decreases your body's ability to fight infections. Try to avoid being around people who are sick. This medicine may increase your risk to bruise or bleed. Call your doctor or health care professional if you notice any unusual bleeding. Be careful brushing and flossing your teeth or using a toothpick because you may get an infection or bleed more easily. If you have any dental work done, tell your dentist you are receiving this medicine. Avoid taking products that contain aspirin, acetaminophen, ibuprofen, naproxen, or ketoprofen unless instructed by your doctor. These medicines may hide a fever. Do not become pregnant while taking this medicine. Women should inform their doctor if they wish to become pregnant or think they might be pregnant. There is a potential for serious side effects to an unborn child. Talk to your health care professional or pharmacist for more information. Do not breast-feed an infant while taking this medicine. Men should inform their doctor if they wish to father a child. This medicine may lower sperm counts. Do not treat diarrhea with over the counter products. Contact your doctor if you  have diarrhea that lasts more than 2 days or if it is severe and watery. This medicine can make you more sensitive to the sun. Keep out of the sun. If you cannot avoid being in the sun, wear protective clothing and use sunscreen. Do not use sun lamps or tanning beds/booths. What side effects may I notice from receiving this medicine? Side effects that you should report to your doctor or health care professional as soon as possible: -allergic reactions like skin rash, itching or hives, swelling of the face, lips, or tongue -low blood counts - this medicine may decrease the number of white blood cells, red blood cells and platelets. You may be at increased risk for infections and bleeding. -signs of infection - fever or chills, cough, sore throat, pain or difficulty passing urine -signs of decreased platelets or bleeding - bruising, pinpoint red spots on the skin, black, tarry stools, blood in the urine -signs of decreased red blood cells - unusually weak or tired, fainting spells, lightheadedness -breathing problems -changes in vision -chest pain -mouth sores -nausea and vomiting -pain, swelling, redness at site where injected -pain, tingling, numbness in the hands or feet -redness, swelling, or sores on hands or feet -stomach pain -unusual bleeding Side effects that usually do not  require medical attention (report to your doctor or health care professional if they continue or are bothersome): -changes in finger or toe nails -diarrhea -dry or itchy skin -hair loss -headache -loss of appetite -sensitivity of eyes to the light -stomach upset -unusually teary eyes This list may not describe all possible side effects. Call your doctor for medical advice about side effects. You may report side effects to FDA at 1-800-FDA-1088. Where should I keep my medicine? This drug is given in a hospital or clinic and will not be stored at home. NOTE: This sheet is a summary. It may not cover all  possible information. If you have questions about this medicine, talk to your doctor, pharmacist, or health care provider.  2015, Elsevier/Gold Standard. (2007-05-12 13:53:16)  [facility] Discharge Instructions for Patients Receiving Chemotherapy  Today you received the following chemotherapy agents Oxaliplatin/Leucovorin/Fluorouricil.  To help prevent nausea and vomiting after your treatment, we encourage you to take your nausea medication as directed.   If you develop nausea and vomiting that is not controlled by your nausea medication, call the clinic.   BELOW ARE SYMPTOMS THAT SHOULD BE REPORTED IMMEDIATELY:  *FEVER GREATER THAN 100.5 F  *CHILLS WITH OR WITHOUT FEVER  NAUSEA AND VOMITING THAT IS NOT CONTROLLED WITH YOUR NAUSEA MEDICATION  *UNUSUAL SHORTNESS OF BREATH  *UNUSUAL BRUISING OR BLEEDING  TENDERNESS IN MOUTH AND THROAT WITH OR WITHOUT PRESENCE OF ULCERS  *URINARY PROBLEMS  *BOWEL PROBLEMS  UNUSUAL RASH Items with * indicate a potential emergency and should be followed up as soon as possible.  Feel free to call the clinic you have any questions or concerns. The clinic phone number is [phone].  Please show the [place] at check-in to the Emergency Department and triage nurse.

## 2014-08-09 NOTE — Progress Notes (Signed)
Oncology Nurse Navigator Documentation  Oncology Nurse Navigator Flowsheets 08/09/2014  Referral date to RadOnc/MedOnc -  Navigator Encounter Type Treatment  Patient Visit Type Medonc  Treatment Phase First Chemo Tx  Barriers/Navigation Needs No barriers at this time  Interventions None required  Support Groups/Services Reports family is good emotional support for her  Time Spent with Patient 10  Met with patient during initial chemotherapy treatment to provide support and assess for needs to promote continuity of care. Discussed: 1. Antiemetic regimen and to call for uncontrolled nausea 2. Cold sensitivity/neuropathy 3. Importance of eating well, pushing po fluids and trying to stay active.  Merceda Elks, RN, BSN GI Oncology [place]

## 2014-08-10 ENCOUNTER — Other Ambulatory Visit: Payer: Self-pay | Admitting: Physician Assistant

## 2014-08-11 ENCOUNTER — Ambulatory Visit (HOSPITAL_COMMUNITY): Admission: RE | Admit: 2014-08-11 | Payer: BLUE CROSS/BLUE SHIELD | Source: Ambulatory Visit

## 2014-08-11 ENCOUNTER — Encounter (HOSPITAL_COMMUNITY): Payer: Self-pay | Admitting: Emergency Medicine

## 2014-08-11 ENCOUNTER — Emergency Department (HOSPITAL_COMMUNITY): Payer: BLUE CROSS/BLUE SHIELD

## 2014-08-11 ENCOUNTER — Other Ambulatory Visit: Payer: Self-pay | Admitting: Nurse Practitioner

## 2014-08-11 ENCOUNTER — Ambulatory Visit (HOSPITAL_BASED_OUTPATIENT_CLINIC_OR_DEPARTMENT_OTHER): Payer: BLUE CROSS/BLUE SHIELD

## 2014-08-11 ENCOUNTER — Ambulatory Visit: Payer: BLUE CROSS/BLUE SHIELD

## 2014-08-11 ENCOUNTER — Telehealth: Payer: Self-pay | Admitting: *Deleted

## 2014-08-11 ENCOUNTER — Inpatient Hospital Stay (HOSPITAL_COMMUNITY)
Admission: RE | Admit: 2014-08-11 | Discharge: 2014-08-15 | DRG: 690 | Disposition: A | Discharge status: Home / Self Care | Payer: BLUE CROSS/BLUE SHIELD | Attending: Internal Medicine | Admitting: Internal Medicine

## 2014-08-11 ENCOUNTER — Other Ambulatory Visit: Payer: BLUE CROSS/BLUE SHIELD | Admitting: *Deleted

## 2014-08-11 ENCOUNTER — Ambulatory Visit (HOSPITAL_BASED_OUTPATIENT_CLINIC_OR_DEPARTMENT_OTHER): Payer: BLUE CROSS/BLUE SHIELD | Admitting: Nurse Practitioner

## 2014-08-11 ENCOUNTER — Encounter: Payer: Self-pay | Admitting: Nurse Practitioner

## 2014-08-11 VITALS — BP 139/68 | HR 100 | Temp 100.0°F | Resp 18

## 2014-08-11 VITALS — BP 177/79 | HR 93 | Temp 99.1°F | Resp 20

## 2014-08-11 DIAGNOSIS — E119 Type 2 diabetes mellitus without complications: Secondary | ICD-10-CM | POA: Insufficient documentation

## 2014-08-11 DIAGNOSIS — C187 Malignant neoplasm of sigmoid colon: Secondary | ICD-10-CM

## 2014-08-11 DIAGNOSIS — R059 Cough, unspecified: Secondary | ICD-10-CM

## 2014-08-11 DIAGNOSIS — C189 Malignant neoplasm of colon, unspecified: Secondary | ICD-10-CM

## 2014-08-11 DIAGNOSIS — R509 Fever, unspecified: Secondary | ICD-10-CM | POA: Diagnosis not present

## 2014-08-11 DIAGNOSIS — R112 Nausea with vomiting, unspecified: Secondary | ICD-10-CM | POA: Insufficient documentation

## 2014-08-11 DIAGNOSIS — Z794 Long term (current) use of insulin: Secondary | ICD-10-CM

## 2014-08-11 DIAGNOSIS — E860 Dehydration: Secondary | ICD-10-CM

## 2014-08-11 DIAGNOSIS — E785 Hyperlipidemia, unspecified: Secondary | ICD-10-CM | POA: Diagnosis present

## 2014-08-11 DIAGNOSIS — T451X5A Adverse effect of antineoplastic and immunosuppressive drugs, initial encounter: Secondary | ICD-10-CM | POA: Diagnosis not present

## 2014-08-11 DIAGNOSIS — C787 Secondary malignant neoplasm of liver and intrahepatic bile duct: Secondary | ICD-10-CM | POA: Diagnosis present

## 2014-08-11 DIAGNOSIS — Z8249 Family history of ischemic heart disease and other diseases of the circulatory system: Secondary | ICD-10-CM

## 2014-08-11 DIAGNOSIS — R05 Cough: Secondary | ICD-10-CM

## 2014-08-11 DIAGNOSIS — Z833 Family history of diabetes mellitus: Secondary | ICD-10-CM

## 2014-08-11 DIAGNOSIS — Z79899 Other long term (current) drug therapy: Secondary | ICD-10-CM

## 2014-08-11 DIAGNOSIS — Z452 Encounter for adjustment and management of vascular access device: Secondary | ICD-10-CM | POA: Diagnosis not present

## 2014-08-11 DIAGNOSIS — Z825 Family history of asthma and other chronic lower respiratory diseases: Secondary | ICD-10-CM

## 2014-08-11 DIAGNOSIS — R1115 Cyclical vomiting syndrome unrelated to migraine: Secondary | ICD-10-CM

## 2014-08-11 DIAGNOSIS — E11649 Type 2 diabetes mellitus with hypoglycemia without coma: Secondary | ICD-10-CM

## 2014-08-11 DIAGNOSIS — Z79891 Long term (current) use of opiate analgesic: Secondary | ICD-10-CM

## 2014-08-11 DIAGNOSIS — Z800 Family history of malignant neoplasm of digestive organs: Secondary | ICD-10-CM

## 2014-08-11 DIAGNOSIS — Z87891 Personal history of nicotine dependence: Secondary | ICD-10-CM

## 2014-08-11 DIAGNOSIS — I10 Essential (primary) hypertension: Secondary | ICD-10-CM | POA: Diagnosis present

## 2014-08-11 DIAGNOSIS — N390 Urinary tract infection, site not specified: Principal | ICD-10-CM | POA: Diagnosis present

## 2014-08-11 DIAGNOSIS — D630 Anemia in neoplastic disease: Secondary | ICD-10-CM | POA: Diagnosis present

## 2014-08-11 LAB — URINE MICROSCOPIC-ADD ON

## 2014-08-11 LAB — COMPREHENSIVE METABOLIC PANEL
ALK PHOS: 178 U/L — AB (ref 38–126)
ALT: 51 U/L (ref 14–54)
AST: 70 U/L — ABNORMAL HIGH (ref 15–41)
Albumin: 3.7 g/dL (ref 3.5–5.0)
Anion gap: 9 (ref 5–15)
BUN: 22 mg/dL — ABNORMAL HIGH (ref 6–20)
CALCIUM: 8.8 mg/dL — AB (ref 8.9–10.3)
CO2: 26 mmol/L (ref 22–32)
Chloride: 98 mmol/L — ABNORMAL LOW (ref 101–111)
Creatinine, Ser: 0.98 mg/dL (ref 0.44–1.00)
GFR calc non Af Amer: >60 mL/min (ref >60)
Glucose, Bld: 97 mg/dL (ref 65–99)
Potassium: 4.3 mmol/L (ref 3.5–5.1)
Sodium: 133 mmol/L — ABNORMAL LOW (ref 135–145)
Total Bilirubin: 0.7 mg/dL (ref 0.3–1.2)
Total Protein: 7.6 g/dL (ref 6.5–8.1)

## 2014-08-11 LAB — URINALYSIS, ROUTINE W REFLEX MICROSCOPIC
BILIRUBIN URINE: NEGATIVE
Glucose, UA: NEGATIVE mg/dL
Ketones, ur: NEGATIVE mg/dL
NITRITE: NEGATIVE
Protein, ur: 30 mg/dL — AB
Specific Gravity, Urine: 1.014 (ref 1.005–1.030)
Urobilinogen, UA: 1 mg/dL (ref 0.0–1.0)
pH: 6.5 (ref 5.0–8.0)

## 2014-08-11 LAB — COMPREHENSIVE METABOLIC PANEL (CC13)
ALT: 58 U/L — AB (ref 0–55)
ANION GAP: 11 meq/L (ref 3–11)
AST: 76 U/L — AB (ref 5–34)
Albumin: 3.8 g/dL (ref 3.5–5.0)
Alkaline Phosphatase: 210 U/L — ABNORMAL HIGH (ref 40–150)
BUN: 25.7 mg/dL (ref 7.0–26.0)
CO2: 26 meq/L (ref 22–29)
Calcium: 9.9 mg/dL (ref 8.4–10.4)
Chloride: 97 mEq/L — ABNORMAL LOW (ref 98–109)
Creatinine: 1.1 mg/dL (ref 0.6–1.1)
EGFR: 62 mL/min/{1.73_m2} — ABNORMAL LOW (ref >90)
Glucose: 64 mg/dl — ABNORMAL LOW (ref 70–140)
Potassium: 4.5 mEq/L (ref 3.5–5.1)
Sodium: 134 mEq/L — ABNORMAL LOW (ref 136–145)
Total Bilirubin: 0.75 mg/dL (ref 0.20–1.20)
Total Protein: 7.8 g/dL (ref 6.4–8.3)

## 2014-08-11 LAB — CBC WITH DIFFERENTIAL/PLATELET
BASO%: 0 % (ref 0.0–2.0)
BASOS ABS: 0 10*3/uL (ref 0.0–0.1)
BASOS ABS: 0 10*3/uL (ref 0.0–0.1)
Basophils Relative: 0 % (ref 0–1)
EOS%: 0 % (ref 0.0–7.0)
Eosinophils Absolute: 0 10*3/uL (ref 0.0–0.5)
Eosinophils Absolute: 0 10*3/uL (ref 0.0–0.7)
Eosinophils Relative: 0 % (ref 0–5)
HCT: 32.5 % — ABNORMAL LOW (ref 34.8–46.6)
HCT: 32.3 % — ABNORMAL LOW (ref 36.0–46.0)
HEMOGLOBIN: 10.4 g/dL — AB (ref 12.0–15.0)
HGB: 10.5 g/dL — ABNORMAL LOW (ref 11.6–15.9)
LYMPH%: 5.1 % — ABNORMAL LOW (ref 14.0–49.7)
Lymphocytes Relative: 6 % — ABNORMAL LOW (ref 12–46)
Lymphs Abs: 0.8 10*3/uL (ref 0.7–4.0)
MCH: 25.8 pg (ref 25.1–34.0)
MCH: 26 pg (ref 26.0–34.0)
MCHC: 32.3 g/dL (ref 31.5–36.0)
MCHC: 32.2 g/dL (ref 30.0–36.0)
MCV: 79.9 fL (ref 79.5–101.0)
MCV: 80.8 fL (ref 78.0–100.0)
MONO#: 0.4 10*3/uL (ref 0.1–0.9)
MONO%: 3.4 % (ref 0.0–14.0)
Monocytes Absolute: 0.6 10*3/uL (ref 0.1–1.0)
Monocytes Relative: 5 % (ref 3–12)
NEUT#: 11.9 10*3/uL — ABNORMAL HIGH (ref 1.5–6.5)
NEUT%: 91.5 % — ABNORMAL HIGH (ref 38.4–76.8)
NEUTROS ABS: 11.2 10*3/uL — AB (ref 1.7–7.7)
Neutrophils Relative %: 89 % — ABNORMAL HIGH (ref 43–77)
PLATELETS: 244 10*3/uL (ref 145–400)
Platelets: 222 10*3/uL (ref 150–400)
RBC: 4.07 10*6/uL (ref 3.70–5.45)
RBC: 4 MIL/uL (ref 3.87–5.11)
RDW: 14.8 % — ABNORMAL HIGH (ref 11.2–14.5)
RDW: 14.8 % (ref 11.5–15.5)
WBC: 13 10*3/uL — AB (ref 3.9–10.3)
WBC: 12.6 10*3/uL — ABNORMAL HIGH (ref 4.0–10.5)
lymph#: 0.7 10*3/uL — ABNORMAL LOW (ref 0.9–3.3)

## 2014-08-11 LAB — I-STAT CG4 LACTIC ACID, ED
LACTIC ACID, VENOUS: 1.18 mmol/L (ref 0.5–2.0)
Lactic Acid, Venous: 0.91 mmol/L (ref 0.5–2.0)

## 2014-08-11 LAB — WHOLE BLOOD GLUCOSE: Glucose: 67 mg/dL — ABNORMAL LOW (ref 70–100)

## 2014-08-11 MED ORDER — PROMETHAZINE HCL 25 MG/ML IJ SOLN: 25.0000 mg | Freq: Four times a day (QID) | INTRAMUSCULAR | Status: DC | PRN

## 2014-08-11 MED ORDER — SODIUM CHLORIDE 0.9 % IV SOLN
INTRAVENOUS | Status: DC
  Administered 2014-08-11: 125 mL/h via INTRAVENOUS
  Administered 2014-08-12: 1000 mL via INTRAVENOUS
  Administered 2014-08-12: 17:00:00 via INTRAVENOUS
  Administered 2014-08-13: 1000 mL via INTRAVENOUS

## 2014-08-11 MED ORDER — HEPARIN SOD (PORK) LOCK FLUSH 100 UNIT/ML IV SOLN
500.0000 [IU] | Freq: Once | INTRAVENOUS | Status: AC | PRN
  Administered 2014-08-11: 500 [IU]
  Filled 2014-08-11: qty 5

## 2014-08-11 MED ORDER — LORAZEPAM 0.5 MG PO TABS: 0.5000 mg | ORAL_TABLET | Freq: Four times a day (QID) | ORAL | Status: DC | PRN

## 2014-08-11 MED ORDER — SODIUM CHLORIDE 0.9 % IJ SOLN
10.0000 mL | Freq: Once | INTRAMUSCULAR | Status: AC
  Administered 2014-08-11: 10 mL via INTRAVENOUS
  Filled 2014-08-11: qty 10

## 2014-08-11 MED ORDER — INSULIN ASPART 100 UNIT/ML ~~LOC~~ SOLN
0.0000 [IU] | SUBCUTANEOUS | Status: DC
  Administered 2014-08-12: 2 [IU] via SUBCUTANEOUS

## 2014-08-11 MED ORDER — LORAZEPAM 2 MG/ML IJ SOLN
INTRAMUSCULAR | Status: AC
  Filled 2014-08-11: qty 1

## 2014-08-11 MED ORDER — DEXTROSE 5 % IV SOLN
1.0000 g | Freq: Once | INTRAVENOUS | Status: DC
  Filled 2014-08-11: qty 10

## 2014-08-11 MED ORDER — HEPARIN SODIUM (PORCINE) 5000 UNIT/ML IJ SOLN
5000.0000 [IU] | Freq: Three times a day (TID) | INTRAMUSCULAR | Status: DC
  Administered 2014-08-12 – 2014-08-13 (×4): 5000 [IU] via SUBCUTANEOUS
  Filled 2014-08-11 (×9): qty 1

## 2014-08-11 MED ORDER — AMLODIPINE BESYLATE 5 MG PO TABS
5.0000 mg | ORAL_TABLET | Freq: Every day | ORAL | Status: DC
  Administered 2014-08-12 – 2014-08-15 (×4): 5 mg via ORAL
  Filled 2014-08-11 (×4): qty 1

## 2014-08-11 MED ORDER — SODIUM CHLORIDE 0.9 % IJ SOLN
10.0000 mL | INTRAMUSCULAR | Status: DC | PRN
Start: 2014-08-11 — End: 2014-08-11
  Administered 2014-08-11: 10 mL
  Filled 2014-08-11: qty 10

## 2014-08-11 MED ORDER — PROMETHAZINE HCL 25 MG/ML IJ SOLN
25.0000 mg | Freq: Once | INTRAMUSCULAR | Status: AC
  Administered 2014-08-11: 25 mg via INTRAVENOUS
  Filled 2014-08-11: qty 1

## 2014-08-11 MED ORDER — HEPARIN SOD (PORK) LOCK FLUSH 100 UNIT/ML IV SOLN
500.0000 [IU] | Freq: Once | INTRAVENOUS | Status: AC
  Administered 2014-08-11: 500 [IU] via INTRAVENOUS
  Filled 2014-08-11: qty 5

## 2014-08-11 MED ORDER — LORAZEPAM 2 MG/ML IJ SOLN
0.5000 mg | Freq: Once | INTRAMUSCULAR | Status: AC
  Administered 2014-08-11: 0.5 mg via INTRAVENOUS

## 2014-08-11 MED ORDER — SODIUM CHLORIDE 0.9 % IV SOLN
INTRAVENOUS | Status: AC
  Administered 2014-08-11: 1500 mL via INTRAVENOUS

## 2014-08-11 MED ORDER — FERROUS SULFATE 325 (65 FE) MG PO TABS
325.0000 mg | ORAL_TABLET | Freq: Every day | ORAL | Status: DC
  Administered 2014-08-12 – 2014-08-15 (×4): 325 mg via ORAL
  Filled 2014-08-11 (×6): qty 1

## 2014-08-11 MED ORDER — ATORVASTATIN CALCIUM 40 MG PO TABS
40.0000 mg | ORAL_TABLET | Freq: Every day | ORAL | Status: DC
  Administered 2014-08-12 – 2014-08-15 (×4): 40 mg via ORAL
  Filled 2014-08-11 (×4): qty 1

## 2014-08-11 MED ORDER — ONDANSETRON HCL 4 MG/2ML IJ SOLN
4.0000 mg | Freq: Once | INTRAMUSCULAR | Status: AC
  Administered 2014-08-11: 4 mg via INTRAVENOUS
  Filled 2014-08-11: qty 2

## 2014-08-11 MED ORDER — OXYCODONE-ACETAMINOPHEN 5-325 MG PO TABS
1.0000 | ORAL_TABLET | ORAL | Status: DC | PRN
Start: 2014-08-11 — End: 2014-08-15

## 2014-08-11 MED ORDER — INSULIN GLARGINE 100 UNIT/ML ~~LOC~~ SOLN
15.0000 [IU] | Freq: Every day | SUBCUTANEOUS | Status: DC
  Administered 2014-08-12 – 2014-08-14 (×3): 15 [IU] via SUBCUTANEOUS
  Filled 2014-08-11 (×5): qty 0.15

## 2014-08-11 MED ORDER — LOSARTAN POTASSIUM 50 MG PO TABS
50.0000 mg | ORAL_TABLET | Freq: Every day | ORAL | Status: DC
  Administered 2014-08-12 – 2014-08-15 (×4): 50 mg via ORAL
  Filled 2014-08-11 (×4): qty 1

## 2014-08-11 NOTE — Assessment & Plan Note (Signed)
Patient received her first cycle of FOLFOX chemotherapy this past Wednesday, 08/09/2014.  She developed nausea and vomiting with subsequent dehydration earlier today.  She took Zofran ODT at home with only minimal effectiveness.  She was given Ativan IV for treatment of her nausea this afternoon.  She continues to complain of some mild nausea; despite medications.  She continues dehydrated as well.  Patient will be transported to the emergency department for further evaluation and management.  Brief history report were called to the emergency department charge nurse prior to transporting the patient to the emergency department via wheelchair.  Per the cancer Center nurse.

## 2014-08-11 NOTE — Assessment & Plan Note (Signed)
Patient has a history of chronic diabetes.  She typically takes Lantus for overnight coverage; and uses regular insulin on a sliding scale basis.  She states she took the Lantus insulin as directed.  Last night; and has been nothing by mouth since yesterday evening in preparation for her liver biopsy that was planned for this afternoon.  Blood sugar on initial check today was 64.  Patient was able to drink some apple juice; and later check of blood sugar revealed a slight increase of blood sugar only to 67.  Patient will be transported to the emergency department for further evaluation and management.  Advised both patient and her family the patient should hold all Lantus insulin if she is unable to take anything by mouth; and to manage her blood sugars with her sliding scale insulin only.

## 2014-08-11 NOTE — Progress Notes (Signed)
Blood Glucose 64. Given Apple juice with extra sugar to drink after received IVP Ativan for nausea.  @4 :32 lab performed finger for CBG. CBG=67. Selena Lesser, NP made aware.

## 2014-08-11 NOTE — Telephone Encounter (Signed)
Received call from husband Michelle Erickson stating that Michelle Erickson forgot to take pill after chemo and had nausea/vomiting.  Spoke with Michelle Erickson and was informed that Michelle Erickson forgot to take Zofran yesterday AM as instructed for post chemo.  Michelle Erickson did take Zofran last night.  Michelle Erickson vomited x 1 at 6 AM after breakfast.  Michelle Erickson had to be NPO after 7 AM for Liver bx today at 2 PM.  Instructed Michelle Erickson to take Zofran ODT now to help with nausea. Per Dr. Benay Spice, Michelle Erickson can either come in after biopsy at 330 PM as scheduled for pump disconnect , or Michelle Erickson can come in early at 130p for pump disconnect and waste remaining chemo drug.  Informed Michelle Erickson of Dr. Gearldine Shown instructions, Michelle Erickson opted to come in after biopsy for pump d/c. Michelle Erickson's  Phone    [phone].

## 2014-08-11 NOTE — Progress Notes (Signed)
1740.  VS checked.  Called Ms. Berniece Salines with [facility].  Patient doesn't know if she wants to go home.  "I feel much better but my stomach hurts like it did when I was nauseated." 1753 Saltine crackers and Gingerale given.  Ate one pack and few sips of soda. 1830 vomited 300 ml green emesis with remnants of cracker when stood to transfer to wheelchair for d/c.  Verbal order to go to ER per Selena Lesser NP [place].

## 2014-08-11 NOTE — ED Notes (Signed)
Delay in lab draw, tech getting rectal temp

## 2014-08-11 NOTE — ED Notes (Signed)
Pt arrived from [place] [facility]. Pt was at [facility] center today to have a liver biopsy and had n/v and abdominal cramping. Pt also had low blood sugar, 55mg /dl after taking her Lantus this morning. Pt given Ginger Ale and crackers at [place] center. She did not have the biopsy today. Upon d/c of pt, she began to have n/v and was referred to ED for further treatment. Pt alert, no acute distress. Skin warm, dry.

## 2014-08-11 NOTE — ED Notes (Signed)
Bed: FH54 Expected date:  Expected time:  Means of arrival:  Comments: Pt from [place]

## 2014-08-11 NOTE — Assessment & Plan Note (Addendum)
Patient received her first cycle of FOLFOX chemotherapy this past Wednesday, 08/09/2014.  She developed nausea and vomiting with subsequent dehydration earlier today.  She took Zofran ODT at home with only minimal effectiveness.  She was given Ativan IV for treatment of her nausea this afternoon.  She continues to complain of some mild nausea; despite medications.  She continues dehydrated as well.  Patient was given a prescription for Ativan to try at home as well.  Patient will be transported to the emergency department for further evaluation and management.  Brief history report were called to the emergency department charge nurse prior to transporting the patient to the emergency department via wheelchair.  Per the cancer Center nurse.

## 2014-08-11 NOTE — H&P (Signed)
[facility] History and Physical  INGRA ROTHER DVV:[id number] DOB: 25-May-1955 DOA: 08/11/2014  Referring physician: EDP PCP: Sherrie Mustache, MD   Chief Complaint: N/V   HPI: Michelle Erickson is a 59 y.o. female with h/o colon cancer.  Patient just completed first cycle of chemotherapy and had pump removed today at office.  Patient developed nausea last night and has multiple episodes of vomiting throughout the day today, not relieved by zofran apparently.  Patient was at cancer center today who sent her to the ED for further evaluation after trying to treat nausea with ativan.  Not noted fever or chills at home.  No headache, neck pain, abd pain.  Review of Systems: Systems reviewed.  As above, otherwise negative  Past Medical History  Diagnosis Date  . Hyperlipemia   . Diabetes mellitus without complication   . Hypertension   . Metastatic colon cancer to liver 07/21/2014  . Wears glasses     contacts/glasses  . Snoring   . Back pain   . Family history of colon cancer    Past Surgical History  Procedure Laterality Date  . Mouth surgery    . Cataract extraction Bilateral   . Portacath placement N/A 08/03/2014    Procedure: INSERTION PORT-A-CATH;  Surgeon: Stark Klein, MD;  Location: [place];  Service: General;  Laterality: N/A;   Social History:  reports that she quit smoking about 38 years ago. Her smoking use included Cigarettes. She has a 6 pack-year smoking history. She does not have any smokeless tobacco history on file. She reports that she does not drink alcohol or use illicit drugs.  No Known Allergies  Family History  Problem Relation Age of Onset  . COPD Father   . Heart attack Father   . Arthritis Sister   . Diabetes Sister   . Colon cancer Sister     dx over 67  . Colon polyps Sister   . Seizures Brother   . Colon cancer Sister     dx in her early 87s, died in her early to mid 43s  . Diabetes Mother      Prior to Admission medications    Medication Sig Start Date End Date Taking? Authorizing Provider  amLODipine (NORVASC) 5 MG tablet Take 5 mg by mouth daily. 07/17/14  Yes Historical Provider, MD  atorvastatin (LIPITOR) 40 MG tablet Take 40 mg by mouth daily. 07/04/14 07/04/15 Yes Historical Provider, MD  ferrous sulfate 325 (65 FE) MG tablet Take 325 mg by mouth daily with breakfast.   Yes Historical Provider, MD  insulin glargine (LANTUS) 100 UNIT/ML injection Inject 28 Units into the skin at bedtime.   Yes Historical Provider, MD  insulin lispro (HUMALOG) 100 UNIT/ML injection Inject 4-8 Units into the skin 2 (two) times daily as needed for high blood sugar. Sliding scale   Yes Historical Provider, MD  lidocaine-prilocaine (EMLA) cream Apply to affected area once 08/08/14  Yes Truitt Merle, MD  losartan (COZAAR) 50 MG tablet Take 50 mg by mouth. 01/25/14 01/25/15 Yes Historical Provider, MD  ondansetron (ZOFRAN-ODT) 8 MG disintegrating tablet Take 8 mg by mouth every 8 (eight) hours as needed for nausea or vomiting.   Yes Historical Provider, MD  oxyCODONE-acetaminophen (ROXICET) 5-325 MG per tablet Take 1-2 tablets by mouth every 4 (four) hours as needed for severe pain. 08/03/14  Yes Stark Klein, MD  PRESCRIPTION MEDICATION Chemo   Yes Historical Provider, MD  LORazepam (ATIVAN) 0.5 MG tablet Take 1 tablet (0.5 mg  total) by mouth every 6 (six) hours as needed for anxiety. Patient not taking: Reported on 08/11/2014 08/11/14   Susanne Borders, NP   Physical Exam: Filed Vitals:   08/11/14 2037  BP: 173/73  Pulse: 99  Temp: 100.6 F (38.1 C)  Resp: 18    BP 173/73 mmHg  Pulse 99  Temp(Src) 100.6 F (38.1 C) (Rectal)  Resp 18  SpO2 99%  General Appearance:    Alert, oriented, no distress, appears stated age  Head:    Normocephalic, atraumatic  Eyes:    PERRL, EOMI, sclera non-icteric        Nose:   Nares without drainage or epistaxis. Mucosa, turbinates normal  Throat:   Moist mucous membranes. Oropharynx without erythema or  exudate.  Neck:   Supple. No carotid bruits.  No thyromegaly.  No lymphadenopathy.   Back:     No CVA tenderness, no spinal tenderness  Lungs:     Clear to auscultation bilaterally, without wheezes, rhonchi or rales  Chest wall:    No tenderness to palpitation  Heart:    Regular rate and rhythm without murmurs, gallops, rubs  Abdomen:     Soft, non-tender, nondistended, normal bowel sounds, no organomegaly  Genitalia:    deferred  Rectal:    deferred  Extremities:   No clubbing, cyanosis or edema.  Pulses:   2+ and symmetric all extremities  Skin:   Skin color, texture, turgor normal, no rashes or lesions  Lymph nodes:   Cervical, supraclavicular, and axillary nodes normal  Neurologic:   CNII-XII intact. Normal strength, sensation and reflexes      throughout    Labs on Admission:  Basic Metabolic Panel:  Recent Labs Lab 08/09/14 1312 08/11/14 1410 08/11/14 2047  NA 136 134* 133*  K 4.5 4.5 4.3  CL  --   --  98*  CO2 26 26 26   GLUCOSE 181* 64* 97  BUN 17.1 25.7 22*  CREATININE 0.9 1.1 0.98  CALCIUM 9.3 9.9 8.8*   Liver Function Tests:  Recent Labs Lab 08/09/14 1312 08/11/14 1410 08/11/14 2047  AST 44* 76* 70*  ALT 52 58* 51  ALKPHOS 213* 210* 178*  BILITOT 0.36 0.75 0.7  PROT 7.3 7.8 7.6  ALBUMIN 3.5 3.8 3.7   No results for input(s): LIPASE, AMYLASE in the last 168 hours. No results for input(s): AMMONIA in the last 168 hours. CBC:  Recent Labs Lab 08/11/14 1410 08/11/14 2047  WBC 13.0* 12.6*  NEUTROABS 11.9* 11.2*  HGB 10.5* 10.4*  HCT 32.5* 32.3*  MCV 79.9 80.8  PLT 244 222   Cardiac Enzymes: No results for input(s): CKTOTAL, CKMB, CKMBINDEX, TROPONINI in the last 168 hours.  BNP (last 3 results) No results for input(s): PROBNP in the last 8760 hours. CBG: No results for input(s): GLUCAP in the last 168 hours.  Radiological Exams on Admission: Dg Chest Port 1 View  08/11/2014   CLINICAL DATA:  Fever and cough for 24 hours. Colon carcinoma  with liver metastases  EXAM: PORTABLE CHEST - 1 VIEW  COMPARISON:  August 03, 2014  FINDINGS: Port-A-Cath tip is in the superior vena cava. No pneumothorax. Lungs are clear. Heart size and pulmonary vascularity are normal. No adenopathy. No bone lesions.  IMPRESSION: Lungs clear.  No appreciable neoplastic focus.   Electronically Signed   By: Lowella Grip III M.D.   On: 08/11/2014 20:21    EKG: Independently reviewed.  Assessment/Plan Principal Problem:   Chemotherapy induced nausea and  vomiting Active Problems:   Metastatic colon cancer to liver   Diabetes   1. Chemotherapy induced nausea and vomiting - 1. IVF 2. Trying Phenergan PRN 3. Oncology to see tomorrow, consult put in computer system. 4. Try to advance to clear liquid diet. 2. DM - 1. Reducing lantus dose to 15 units QHS 2. Q4H low dose SSI    Code Status: Full  Family Communication: Family at bedside Disposition Plan: Admit to obs   Time spent: 50 min  Liliani Bobo M. [facility] Pager [phone]  If 7AM-7PM, please contact the day team taking care of the patient [email or web address] Password [facility] 08/11/2014, 11:54 PM

## 2014-08-11 NOTE — ED Provider Notes (Signed)
CSN: [id number]     Arrival date & time 08/11/14  1848 History   First MD Initiated Contact with Patient 08/11/14 1906     Chief Complaint  Patient presents with  . Emesis     (Consider location/radiation/quality/duration/timing/severity/associated sxs/prior Treatment) HPI   There is a 59 year old female with a recent diagnosis of colon cancer who has undergone her first round of chemotherapy from Wednesday through today. She has had multiple episodes of vomiting today. She's been having difficulty keeping fluids down. She has had some coughing. She has not noted fever or chills at home. She denies head pain, neck pain, or abdominal pain. She reports that she was at the cancer center today and had vomiting there and they told her to come here for further evaluation.  Past Medical History  Diagnosis Date  . Hyperlipemia   . Diabetes mellitus without complication   . Hypertension   . Metastatic colon cancer to liver 07/21/2014  . Wears glasses     contacts/glasses  . Snoring   . Back pain   . Family history of colon cancer    Past Surgical History  Procedure Laterality Date  . Mouth surgery    . Cataract extraction Bilateral   . Portacath placement N/A 08/03/2014    Procedure: INSERTION PORT-A-CATH;  Surgeon: Stark Klein, MD;  Location: [facility];  Service: General;  Laterality: N/A;   Family History  Problem Relation Age of Onset  . COPD Father   . Heart attack Father   . Arthritis Sister   . Diabetes Sister   . Colon cancer Sister     dx over 69  . Colon polyps Sister   . Seizures Brother   . Colon cancer Sister     dx in her early 26s, died in her early to mid 60s  . Diabetes Mother    History  Substance Use Topics  . Smoking status: Former Smoker -- 1.00 packs/day for 6 years    Types: Cigarettes    Quit date: 01/21/1976  . Smokeless tobacco: Not on file  . Alcohol Use: No   OB History    No data available     Review of Systems  All other systems reviewed and  are negative.     Allergies  Review of patient's allergies indicates no known allergies.  Home Medications   Prior to Admission medications   Medication Sig Start Date End Date Taking? Authorizing Provider  amLODipine (NORVASC) 5 MG tablet Take 5 mg by mouth daily. 07/17/14  Yes Historical Provider, MD  atorvastatin (LIPITOR) 40 MG tablet Take 40 mg by mouth daily. 07/04/14 07/04/15 Yes Historical Provider, MD  ferrous sulfate 325 (65 FE) MG tablet Take 325 mg by mouth daily with breakfast.   Yes Historical Provider, MD  insulin glargine (LANTUS) 100 UNIT/ML injection Inject 28 Units into the skin at bedtime.   Yes Historical Provider, MD  insulin lispro (HUMALOG) 100 UNIT/ML injection Inject 4-8 Units into the skin 2 (two) times daily as needed for high blood sugar. Sliding scale   Yes Historical Provider, MD  lidocaine-prilocaine (EMLA) cream Apply to affected area once 08/08/14  Yes Truitt Merle, MD  losartan (COZAAR) 50 MG tablet Take 50 mg by mouth. 01/25/14 01/25/15 Yes Historical Provider, MD  ondansetron (ZOFRAN) 8 MG tablet Take 1 tablet (8 mg total) by mouth 2 (two) times daily. Start the day after chemo for 2 days. Then take as needed for nausea or vomiting. 08/08/14  Yes Krista Blue  Burr Medico, MD  ondansetron (ZOFRAN-ODT) 8 MG disintegrating tablet Take 8 mg by mouth every 8 (eight) hours as needed for nausea or vomiting.   Yes Historical Provider, MD  oxyCODONE-acetaminophen (ROXICET) 5-325 MG per tablet Take 1-2 tablets by mouth every 4 (four) hours as needed for severe pain. 08/03/14  Yes Stark Klein, MD  PRESCRIPTION MEDICATION Chemo   Yes Historical Provider, MD  LORazepam (ATIVAN) 0.5 MG tablet Take 1 tablet (0.5 mg total) by mouth every 6 (six) hours as needed for anxiety. Patient not taking: Reported on 08/11/2014 08/11/14   Susanne Borders, NP   BP 173/73 mmHg  Pulse 99  Temp(Src) 100.6 F (38.1 C) (Rectal)  Resp 18  SpO2 99% Physical Exam  Constitutional: She is oriented to person, place,  and time. She appears well-developed and well-nourished.  HENT:  Head: Normocephalic and atraumatic.  Right Ear: External ear normal.  Left Ear: External ear normal.  Nose: Nose normal.  Mouth/Throat: Oropharynx is clear and moist.  Eyes: Conjunctivae and EOM are normal. Pupils are equal, round, and reactive to light.  Neck: Normal range of motion. Neck supple.  Cardiovascular: Normal rate, regular rhythm, normal heart sounds and intact distal pulses.   Pulmonary/Chest: Effort normal and breath sounds normal.  Port-A-Cath noted in left chest wall. No erythema or evidence of pocket infection.  Abdominal: Soft. Bowel sounds are normal.  Musculoskeletal: Normal range of motion.  Neurological: She is alert and oriented to person, place, and time. She has normal reflexes.  Skin: Skin is warm and dry.  Psychiatric: She has a normal mood and affect. Her behavior is normal. Judgment and thought content normal.  Nursing note and vitals reviewed.   ED Course  Procedures (including critical care time) Labs Review Labs Reviewed  CBC WITH DIFFERENTIAL/PLATELET - Abnormal; Notable for the following:    WBC 12.6 (*)    Hemoglobin 10.4 (*)    HCT 32.3 (*)    Neutrophils Relative % 89 (*)    Neutro Abs 11.2 (*)    Lymphocytes Relative 6 (*)    All other components within normal limits  COMPREHENSIVE METABOLIC PANEL - Abnormal; Notable for the following:    Sodium 133 (*)    Chloride 98 (*)    BUN 22 (*)    Calcium 8.8 (*)    AST 70 (*)    Alkaline Phosphatase 178 (*)    All other components within normal limits  URINALYSIS, ROUTINE W REFLEX MICROSCOPIC (NOT AT [facility]) - Abnormal; Notable for the following:    APPearance CLOUDY (*)    Hgb urine dipstick TRACE (*)    Protein, ur 30 (*)    Leukocytes, UA MODERATE (*)    All other components within normal limits  URINE MICROSCOPIC-ADD ON - Abnormal; Notable for the following:    Squamous Epithelial / LPF MANY (*)    All other components  within normal limits  CULTURE, BLOOD (ROUTINE X 2)  CULTURE, BLOOD (ROUTINE X 2)  I-STAT CG4 LACTIC ACID, ED  I-STAT CG4 LACTIC ACID, ED  I-STAT CG4 LACTIC ACID, ED  I-STAT CG4 LACTIC ACID, ED    Imaging Review Dg Chest Port 1 View  08/11/2014   CLINICAL DATA:  Fever and cough for 24 hours. Colon carcinoma with liver metastases  EXAM: PORTABLE CHEST - 1 VIEW  COMPARISON:  August 03, 2014  FINDINGS: Port-A-Cath tip is in the superior vena cava. No pneumothorax. Lungs are clear. Heart size and pulmonary vascularity are normal. No  adenopathy. No bone lesions.  IMPRESSION: Lungs clear.  No appreciable neoplastic focus.   Electronically Signed   By: Lowella Grip III M.D.   On: 08/11/2014 20:21     EKG Interpretation None      MDM   Final diagnoses:  Cough    59 year old female with recent chemotherapy who presents today with intractable vomiting. She has received antibiotics here and IV fluids. She continues to vomit here in the emergency department. She also has a tentative 100.6. She had blood cultures and lactic acids obtained. She has an elevated white blood cell count at 12,600. Chest x-Aliyanna Wassmer is clear. Urinalysis shows moderate leukocytes With 3-6 white blood cells. Urine is to be cultured. Patient received IV Rocephin here in emergency department. Plan admission to hospitalist service for ongoing fluid hydration, anti-medics, and evaluation of fever.   Pattricia Boss, MD 08/11/14 [phone]

## 2014-08-11 NOTE — Progress Notes (Addendum)
Patient in for pump disconnect today. Patient is in wheelchair and accompanied by her spouse. Patient states, "I've been nauseous since yesterday. I vomited one time this morning." Patient reports feelings of weakness at this time. Patient states that she has not taken any medication to help with her nausea because she had orders to have nothing by mouth after 7 for a scheduled biopsy on today. Patient's pump disconnected and PAC flushed with 10 mL of Normal Saline and 5 mL of Heparin without any difficulty. Blood return noted with flush. Patient's blood pressure is 139/68, pulse 100, temperature 100.0, and SpO2 is 99%. Drue Second, NP made aware of Patient's current vital signs, including temperature of 100, and Patient's reports of feeling nauseous. Drue Second, NP states, "Someone will be over to assess her." Jethro Bolus, LPN nurse for Drue Second, NP in to assess Patient. Orders placed at that time to obtain labs and appointment given to Patient to see Drue Second, NP today. Patient left accessed for appointment with Drue Second, NP. Patient discharged from the Flush Room with Jethro Bolus, LPN.

## 2014-08-11 NOTE — ED Notes (Signed)
Pt, sent from [facility], c/o n/v.  Pt recently diagnosed w/ colon ca w/ liver mets and first chemo x 2 days ago.  Pt was supposed to have a liver biopsy this afternoon, but it was cancelled due to the Pt not feeling well.  Several medications were given in [facility]. See MAR.

## 2014-08-11 NOTE — ED Notes (Signed)
First and last chemo treatment for colon cancer on Wednesday. Denies radiation treatment.

## 2014-08-11 NOTE — Assessment & Plan Note (Signed)
Patient reports a fever to maximum 100.7 within the past 24 hours.  She denies any URI or UTI symptoms.  She denies any cough, shortness of breath, or chest pain.  On exam today  patient does appear pale, fatigued, and weak.  Temperature while at the cancer center was maximum 99.1.  All other vital signs stable.  WBC slightly elevated at 13.0 with ANC of 11.9.  Blood cultures 2 were drawn.  Unable to obtain urinalysis or urine culture; since patient was able to give a urine sample.  Due to patient's report of fever earlier today; and continued nausea/vomiting.  Will transport patient to the emergency department for further evaluation and management.  Brief history and report were called to the emergency department charge nurse prior to transporting patient to the emergency department via wheelchair.  Per the cancer Center nurse.

## 2014-08-11 NOTE — Progress Notes (Addendum)
Discharged to ER via wheelchair at this time.

## 2014-08-11 NOTE — Progress Notes (Signed)
SYMPTOM MANAGEMENT CLINIC   HPI: Michelle Erickson 59 y.o. female diagnosed with colon cancer; with liver metastasis.  Currently undergoing FOLFOX chemotherapy regimen.  Patient received her first cycle of chemotherapy this past Wednesday, 08/09/2014.  She presented to the [facility] today for discontinuation of her chemotherapy pump.  She was scheduled for a liver biopsy this afternoon; but had to cancel that appointment due to chronic nausea, vomiting, and dehydration.  She has rescheduled her liver biopsy appointment to this coming Monday, 08/14/2014.  Patient states that she took Zofran at home with minimal effectiveness.  She is complaining of some cramping abdominal discomfort; and also reports fever to maximum 100.7 within the past 24 hours.  She denies any URI or UTI symptoms whatsoever.  She denies any diarrhea or constipation.   HPI  ROS  Past Medical History  Diagnosis Date  . Hyperlipemia   . Diabetes mellitus without complication   . Hypertension   . Metastatic colon cancer to liver 07/21/2014  . Wears glasses     contacts/glasses  . Snoring   . Back pain   . Family history of colon cancer     Past Surgical History  Procedure Laterality Date  . Mouth surgery    . Cataract extraction Bilateral   . Portacath placement N/A 08/03/2014    Procedure: INSERTION PORT-A-CATH;  Surgeon: Stark Klein, MD;  Location: [facility];  Service: General;  Laterality: N/A;    has Metastatic colon cancer to liver; Family history of colon cancer; Fever; Nausea with vomiting; Dehydration; and Diabetes on her problem list.    has No Known Allergies.    Medication List       This list is accurate as of: 08/11/14  6:48 PM.  Always use your most recent med list.               amLODipine 5 MG tablet  Commonly known as:  NORVASC  Take 5 mg by mouth daily.     COZAAR 50 MG tablet  Generic drug:  losartan  Take 50 mg by mouth.     ferrous sulfate 325 (65 FE) MG tablet  Take 325 mg  by mouth daily with breakfast.     insulin glargine 100 UNIT/ML injection  Commonly known as:  LANTUS  Inject 28 Units into the skin at bedtime.     insulin lispro 100 UNIT/ML injection  Commonly known as:  HUMALOG  Inject 4-8 Units into the skin 2 (two) times daily as needed for high blood sugar. Sliding scale     lidocaine-prilocaine cream  Commonly known as:  EMLA  Apply to affected area once     LIPITOR 40 MG tablet  Generic drug:  atorvastatin  Take 40 mg by mouth daily.     LORazepam 0.5 MG tablet  Commonly known as:  ATIVAN  Take 1 tablet (0.5 mg total) by mouth every 6 (six) hours as needed for anxiety.     ondansetron 8 MG disintegrating tablet  Commonly known as:  ZOFRAN-ODT  Take 8 mg by mouth every 8 (eight) hours as needed for nausea or vomiting.     ondansetron 8 MG tablet  Commonly known as:  ZOFRAN  Take 1 tablet (8 mg total) by mouth 2 (two) times daily. Start the day after chemo for 2 days. Then take as needed for nausea or vomiting.     oxyCODONE-acetaminophen 5-325 MG per tablet  Commonly known as:  ROXICET  Take 1-2 tablets by  mouth every 4 (four) hours as needed for severe pain.         PHYSICAL EXAMINATION  Oncology Vitals 08/11/2014 08/11/2014 08/09/2014 08/08/2014 08/03/2014 08/03/2014 08/03/2014  Height - - - 163 cm - - -  Weight - - - 70.217 kg - - -  Weight (lbs) - - - 154 lbs 13 oz - - -  BMI (kg/m2) - - - 26.57 kg/m2 - - -  Temp 99.1 100 98.2 98.6 - 97.9 -  Pulse 93 100 86 80 94 84 86  Resp _0 SpO2 - 99 100 100 98 99 100  BSA (m2) - - - 1.78 m2 - - -   BP Readings from Last 3 Encounters:  08/11/14 177/79  08/11/14 139/68  08/09/14 158/76    Physical Exam  Constitutional: She is oriented to person, place, and time. She appears dehydrated. She appears unhealthy. She has a sickly appearance.  Patient appears obtained, weak, pale, frail, and chronically ill.  HENT:  Head: Normocephalic and atraumatic.  Mouth/Throat:  Oropharynx is clear and moist.  Eyes: Conjunctivae and EOM are normal. Pupils are equal, round, and reactive to light. Right eye exhibits no discharge. Left eye exhibits no discharge. No scleral icterus.  Neck: Normal range of motion. Neck supple. No JVD present. No tracheal deviation present. No thyromegaly present.  Cardiovascular: Normal rate, regular rhythm, normal heart sounds and intact distal pulses.   Pulmonary/Chest: Effort normal and breath sounds normal. No respiratory distress. She has no wheezes. She has no rales. She exhibits no tenderness.  Abdominal: Soft. Bowel sounds are normal. She exhibits no distension and no mass. There is no tenderness. There is no rebound and no guarding.  Musculoskeletal: Normal range of motion. She exhibits no edema or tenderness.  Lymphadenopathy:    She has no cervical adenopathy.  Neurological: She is alert and oriented to person, place, and time. Gait normal.  Skin: Skin is warm and dry. No rash noted. No erythema. There is pallor.  Psychiatric: Affect normal.  Nursing note and vitals reviewed.   LABORATORY DATA:. Orders Only on 08/11/2014  Component Date Value Ref Range Status  . Glucose 08/11/2014 67* 70 - 100 mg/dL Final  . HRS PC 08/11/2014 Fasting   Final  Appointment on 08/11/2014  Component Date Value Ref Range Status  . WBC 08/11/2014 13.0* 3.9 - 10.3 10e3/uL Final  . NEUT# 08/11/2014 11.9* 1.5 - 6.5 10e3/uL Final  . HGB 08/11/2014 10.5* 11.6 - 15.9 g/dL Final  . HCT 08/11/2014 32.5* 34.8 - 46.6 % Final  . Platelets 08/11/2014 244  145 - 400 10e3/uL Final  . MCV 08/11/2014 79.9  79.5 - 101.0 fL Final  . MCH 08/11/2014 25.8  25.1 - 34.0 pg Final  . MCHC 08/11/2014 32.3  31.5 - 36.0 g/dL Final  . RBC 08/11/2014 4.07  3.70 - 5.45 10e6/uL Final  . RDW 08/11/2014 14.8* 11.2 - 14.5 % Final  . lymph# 08/11/2014 0.7* 0.9 - 3.3 10e3/uL Final  . MONO# 08/11/2014 0.4  0.1 - 0.9 10e3/uL Final  . Eosinophils Absolute 08/11/2014 0.0  0.0 -  0.5 10e3/uL Final  . Basophils Absolute 08/11/2014 0.0  0.0 - 0.1 10e3/uL Final  . NEUT% 08/11/2014 91.5* 38.4 - 76.8 % Final  . LYMPH% 08/11/2014 5.1* 14.0 - 49.7 % Final  . MONO% 08/11/2014 3.4  0.0 - 14.0 % Final  . EOS% 08/11/2014 0.0  0.0 - 7.0 % Final  . BASO% 08/11/2014  0.0  0.0 - 2.0 % Final  . Sodium 08/11/2014 134* 136 - 145 mEq/L Final  . Potassium 08/11/2014 4.5  3.5 - 5.1 mEq/L Final  . Chloride 08/11/2014 97* 98 - 109 mEq/L Final  . CO2 08/11/2014 26  22 - 29 mEq/L Final  . Glucose 08/11/2014 64* 70 - 140 mg/dl Final  . BUN 08/11/2014 25.7  7.0 - 26.0 mg/dL Final  . Creatinine 08/11/2014 1.1  0.6 - 1.1 mg/dL Final  . Total Bilirubin 08/11/2014 0.75  0.20 - 1.20 mg/dL Final  . Alkaline Phosphatase 08/11/2014 210* 40 - 150 U/L Final  . AST 08/11/2014 76* 5 - 34 U/L Final  . ALT 08/11/2014 58* 0 - 55 U/L Final  . Total Protein 08/11/2014 7.8  6.4 - 8.3 g/dL Final  . Albumin 08/11/2014 3.8  3.5 - 5.0 g/dL Final  . Calcium 08/11/2014 9.9  8.4 - 10.4 mg/dL Final  . Anion Gap 08/11/2014 11  3 - 11 mEq/L Final  . EGFR 08/11/2014 62* >90 ml/min/1.73 m2 Final   eGFR is calculated using the CKD-EPI Creatinine Equation (2009)  Appointment on 08/09/2014  Component Date Value Ref Range Status  . Sodium 08/09/2014 136  136 - 145 mEq/L Final  . Potassium 08/09/2014 4.5  3.5 - 5.1 mEq/L Final  . Chloride 08/09/2014 102  98 - 109 mEq/L Final  . CO2 08/09/2014 26  22 - 29 mEq/L Final  . Glucose 08/09/2014 181* 70 - 140 mg/dl Final  . BUN 08/09/2014 17.1  7.0 - 26.0 mg/dL Final  . Creatinine 08/09/2014 0.9  0.6 - 1.1 mg/dL Final  . Total Bilirubin 08/09/2014 0.36  0.20 - 1.20 mg/dL Final  . Alkaline Phosphatase 08/09/2014 213* 40 - 150 U/L Final  . AST 08/09/2014 44* 5 - 34 U/L Final  . ALT 08/09/2014 52  0 - 55 U/L Final  . Total Protein 08/09/2014 7.3  6.4 - 8.3 g/dL Final  . Albumin 08/09/2014 3.5  3.5 - 5.0 g/dL Final  . Calcium 08/09/2014 9.3  8.4 - 10.4 mg/dL Final  . Anion  Gap 08/09/2014 8  3 - 11 mEq/L Final  . EGFR 08/09/2014 85* >90 ml/min/1.73 m2 Final   eGFR is calculated using the CKD-EPI Creatinine Equation (2009)     RADIOGRAPHIC STUDIES: No results found.  ASSESSMENT/PLAN:    Dehydration Patient received her first cycle of FOLFOX chemotherapy this past Wednesday, 08/09/2014.  She developed nausea and vomiting with subsequent dehydration earlier today.  She took Zofran ODT at home with only minimal effectiveness.  She was given Ativan IV for treatment of her nausea this afternoon.  She continues to complain of some mild nausea; despite medications.  She continues dehydrated as well.  Patient will be transported to the emergency department for further evaluation and management.  Brief history report were called to the emergency department charge nurse prior to transporting the patient to the emergency department via wheelchair.  Per the cancer Center nurse.  Diabetes Patient has a history of chronic diabetes.  She typically takes Lantus for overnight coverage; and uses regular insulin on a sliding scale basis.  She states she took the Lantus insulin as directed.  Last night; and has been nothing by mouth since yesterday evening in preparation for her liver biopsy that was planned for this afternoon.  Blood sugar on initial check today was 64.  Patient was able to drink some apple juice; and later check of blood sugar revealed a slight increase of blood  sugar only to 67.  Patient will be transported to the emergency department for further evaluation and management.  Advised both patient and her family the patient should hold all Lantus insulin if she is unable to take anything by mouth; and to manage her blood sugars with her sliding scale insulin only.  Fever Patient reports a fever to maximum 100.7 within the past 24 hours.  She denies any URI or UTI symptoms.  She denies any cough, shortness of breath, or chest pain.  On exam today  patient does  appear pale, fatigued, and weak.  Temperature while at the cancer center was maximum 99.1.  All other vital signs stable.  WBC slightly elevated at 13.0 with ANC of 11.9.  Blood cultures 2 were drawn.  Unable to obtain urinalysis or urine culture; since patient was able to give a urine sample.  Due to patient's report of fever earlier today; and continued nausea/vomiting.  Will transport patient to the emergency department for further evaluation and management.  Brief history and report were called to the emergency department charge nurse prior to transporting patient to the emergency department via wheelchair.  Per the cancer Center nurse.  Metastatic colon cancer to liver Patient received her first cycle of FOLFOX chemotherapy this past Wednesday, 08/09/2014.  She had her chemotherapy pump discontinued earlier today.  Patient was scheduled to obtain a liver biopsy this afternoon; but had to cancel due to feeling so poorly.  She has a liver biopsy rescheduled for this coming Monday, 08/14/2014.  Patient is also scheduled to return to the [facility] for follow-up visit on 08/17/2014.  *Of note- patient is tentatively scheduled for IV fluid rehydration at the cancer center tomorrow 08/12/2014 at 9 AM if patient is not admitted.  Nausea with vomiting Patient received her first cycle of FOLFOX chemotherapy this past Wednesday, 08/09/2014.  She developed nausea and vomiting with subsequent dehydration earlier today.  She took Zofran ODT at home with only minimal effectiveness.  She was given Ativan IV for treatment of her nausea this afternoon.  She continues to complain of some mild nausea; despite medications.  She continues dehydrated as well.  Patient was given a prescription for Ativan to try at home as well.  Patient will be transported to the emergency department for further evaluation and management.  Brief history report were called to the emergency department charge nurse prior to  transporting the patient to the emergency department via wheelchair.  Per the cancer Center nurse.   Patient stated understanding of all instructions; and was in agreement with this plan of care. The patient knows to call the clinic with any problems, questions or concerns.   Review/collaboration with Dr. Alen Blew (on call) regarding all aspects of patient's visit today.   Total time spent with patient was 40 minutes;  with greater than 75 percent of that time spent in face to face counseling regarding patient's symptoms,  and coordination of care and follow up.  Disclaimer: This note was dictated with voice recognition software. Similar sounding words can inadvertently be transcribed and may not be corrected upon review.   Drue Second, NP 08/11/2014

## 2014-08-11 NOTE — Assessment & Plan Note (Signed)
Patient received her first cycle of FOLFOX chemotherapy this past Wednesday, 08/09/2014.  She had her chemotherapy pump discontinued earlier today.  Patient was scheduled to obtain a liver biopsy this afternoon; but had to cancel due to feeling so poorly.  She has a liver biopsy rescheduled for this coming Monday, 08/14/2014.  Patient is also scheduled to return to the [facility] for follow-up visit on 08/17/2014.  *Of note- patient is tentatively scheduled for IV fluid rehydration at the cancer center tomorrow 08/12/2014 at 9 AM if patient is not admitted.

## 2014-08-11 NOTE — Patient Instructions (Addendum)
Drink room temperature clear liquids for the next 24 hours  (water, Gatorade, broth, tea, chicken and rice soup (strained), room temperature jello.  Slowly add soft foods avoiding oils, greasy, fried, spicy, foods.  Take lorazepam/ativan as ordered for nausea.  May make you drowsy.  Ondansetron/Zofran (ODT) oral disentigrating tablet as needed.  Regular zofran must be taken with water.   Do not take Lantus long acting insulin but check blood sugar before meals and at bedtime using humalog as sliding scale.  IF BACK TO NORMAL ... Resume Lantus insulin.   Report at 0900 am tomorrow 08-12-2014 for IV Fluids only if needed.    If temperature elevated and you have no relief of nausea with medicine, report to the Emergency Room. Dehydration, Adult Dehydration is when you lose more fluids from the body than you take in. Vital organs like the kidneys, brain, and heart cannot function without a proper amount of fluids and salt. Any loss of fluids from the body can cause dehydration.  CAUSES   Vomiting.  Diarrhea.  Excessive sweating.  Excessive urine output.  Fever. SYMPTOMS  Mild dehydration  Thirst.  Dry lips.  Slightly dry mouth. Moderate dehydration  Very dry mouth.  Sunken eyes.  Skin does not bounce back quickly when lightly pinched and released.  Dark urine and decreased urine production.  Decreased tear production.  Headache. Severe dehydration  Very dry mouth.  Extreme thirst.  Rapid, weak pulse (more than 100 beats per minute at rest).  Cold hands and feet.  Not able to sweat in spite of heat and temperature.  Rapid breathing.  Blue lips.  Confusion and lethargy.  Difficulty being awakened.  Minimal urine production.  No tears. DIAGNOSIS  Your caregiver will diagnose dehydration based on your symptoms and your exam. Blood and urine tests will help confirm the diagnosis. The diagnostic evaluation should also identify the cause of dehydration. TREATMENT   Treatment of mild or moderate dehydration can often be done at home by increasing the amount of fluids that you drink. It is best to drink small amounts of fluid more often. Drinking too much at one time can make vomiting worse. Refer to the home care instructions below. Severe dehydration needs to be treated at the hospital where you will probably be given intravenous (IV) fluids that contain water and electrolytes. HOME CARE INSTRUCTIONS   Ask your caregiver about specific rehydration instructions.  Drink enough fluids to keep your urine clear or pale yellow.  Drink small amounts frequently if you have nausea and vomiting.  Eat as you normally do.  Avoid:  Foods or drinks high in sugar.  Carbonated drinks.  Juice.  Extremely hot or cold fluids.  Drinks with caffeine.  Fatty, greasy foods.  Alcohol.  Tobacco.  Overeating.  Gelatin desserts.  Wash your hands well to avoid spreading bacteria and viruses.  Only take over-the-counter or prescription medicines for pain, discomfort, or fever as directed by your caregiver.  Ask your caregiver if you should continue all prescribed and over-the-counter medicines.  Keep all follow-up appointments with your caregiver. SEEK MEDICAL CARE IF:  You have abdominal pain and it increases or stays in one area (localizes).  You have a rash, stiff neck, or severe headache.  You are irritable, sleepy, or difficult to awaken.  You are weak, dizzy, or extremely thirsty. SEEK IMMEDIATE MEDICAL CARE IF:   You are unable to keep fluids down or you get worse despite treatment.  You have frequent episodes of vomiting  or diarrhea.  You have blood or green matter (bile) in your vomit.  You have blood in your stool or your stool looks black and tarry.  You have not urinated in 6 to 8 hours, or you have only urinated a small amount of very dark urine.  You have a fever.  You faint. MAKE SURE YOU:   Understand these  instructions.  Will watch your condition.  Will get help right away if you are not doing well or get worse. Document Released: 01/06/2005 Document Revised: 03/31/2011 Document Reviewed: 08/26/2010 [facility] Patient Information 2015 [place], [place]. This information is not intended to replace advice given to you by your health care provider. Make sure you discuss any questions you have with your health care provider.

## 2014-08-12 ENCOUNTER — Encounter (HOSPITAL_COMMUNITY): Payer: Self-pay | Admitting: *Deleted

## 2014-08-12 ENCOUNTER — Ambulatory Visit: Payer: BLUE CROSS/BLUE SHIELD

## 2014-08-12 DIAGNOSIS — Z794 Long term (current) use of insulin: Secondary | ICD-10-CM | POA: Diagnosis not present

## 2014-08-12 DIAGNOSIS — C787 Secondary malignant neoplasm of liver and intrahepatic bile duct: Secondary | ICD-10-CM | POA: Diagnosis present

## 2014-08-12 DIAGNOSIS — R112 Nausea with vomiting, unspecified: Secondary | ICD-10-CM | POA: Diagnosis present

## 2014-08-12 DIAGNOSIS — Z79891 Long term (current) use of opiate analgesic: Secondary | ICD-10-CM | POA: Diagnosis not present

## 2014-08-12 DIAGNOSIS — Z8249 Family history of ischemic heart disease and other diseases of the circulatory system: Secondary | ICD-10-CM | POA: Diagnosis not present

## 2014-08-12 DIAGNOSIS — N390 Urinary tract infection, site not specified: Secondary | ICD-10-CM | POA: Diagnosis present

## 2014-08-12 DIAGNOSIS — N3000 Acute cystitis without hematuria: Secondary | ICD-10-CM

## 2014-08-12 DIAGNOSIS — Z825 Family history of asthma and other chronic lower respiratory diseases: Secondary | ICD-10-CM | POA: Diagnosis not present

## 2014-08-12 DIAGNOSIS — Z87891 Personal history of nicotine dependence: Secondary | ICD-10-CM | POA: Diagnosis not present

## 2014-08-12 DIAGNOSIS — Z79899 Other long term (current) drug therapy: Secondary | ICD-10-CM | POA: Diagnosis not present

## 2014-08-12 DIAGNOSIS — Z833 Family history of diabetes mellitus: Secondary | ICD-10-CM | POA: Diagnosis not present

## 2014-08-12 DIAGNOSIS — E785 Hyperlipidemia, unspecified: Secondary | ICD-10-CM | POA: Diagnosis present

## 2014-08-12 DIAGNOSIS — R05 Cough: Secondary | ICD-10-CM | POA: Diagnosis present

## 2014-08-12 DIAGNOSIS — Z800 Family history of malignant neoplasm of digestive organs: Secondary | ICD-10-CM | POA: Diagnosis not present

## 2014-08-12 DIAGNOSIS — C189 Malignant neoplasm of colon, unspecified: Secondary | ICD-10-CM | POA: Diagnosis present

## 2014-08-12 DIAGNOSIS — E119 Type 2 diabetes mellitus without complications: Secondary | ICD-10-CM | POA: Diagnosis present

## 2014-08-12 DIAGNOSIS — D630 Anemia in neoplastic disease: Secondary | ICD-10-CM | POA: Diagnosis present

## 2014-08-12 DIAGNOSIS — I10 Essential (primary) hypertension: Secondary | ICD-10-CM | POA: Diagnosis present

## 2014-08-12 DIAGNOSIS — C187 Malignant neoplasm of sigmoid colon: Secondary | ICD-10-CM | POA: Diagnosis not present

## 2014-08-12 DIAGNOSIS — T451X5A Adverse effect of antineoplastic and immunosuppressive drugs, initial encounter: Secondary | ICD-10-CM | POA: Diagnosis present

## 2014-08-12 LAB — CBG MONITORING, ED: GLUCOSE-CAPILLARY: 75 mg/dL (ref 65–99)

## 2014-08-12 LAB — GLUCOSE, CAPILLARY
GLUCOSE-CAPILLARY: 76 mg/dL (ref 65–99)
GLUCOSE-CAPILLARY: 119 mg/dL — AB (ref 65–99)
Glucose-Capillary: 66 mg/dL (ref 65–99)
Glucose-Capillary: 139 mg/dL — ABNORMAL HIGH (ref 65–99)
Glucose-Capillary: 125 mg/dL — ABNORMAL HIGH (ref 65–99)
Glucose-Capillary: 170 mg/dL — ABNORMAL HIGH (ref 65–99)

## 2014-08-12 MED ORDER — DEXTROSE 50 % IV SOLN
INTRAVENOUS | Status: AC
  Administered 2014-08-12: 50 mL
  Filled 2014-08-12: qty 50

## 2014-08-12 MED ORDER — INSULIN ASPART 100 UNIT/ML ~~LOC~~ SOLN
0.0000 [IU] | Freq: Three times a day (TID) | SUBCUTANEOUS | Status: DC
  Administered 2014-08-13: 2 [IU] via SUBCUTANEOUS
  Administered 2014-08-14 (×2): 1 [IU] via SUBCUTANEOUS
  Administered 2014-08-15 (×2): 2 [IU] via SUBCUTANEOUS

## 2014-08-12 MED ORDER — VANCOMYCIN HCL IN DEXTROSE 1-5 GM/200ML-% IV SOLN
1000.0000 mg | INTRAVENOUS | Status: AC
  Administered 2014-08-12: 1000 mg via INTRAVENOUS
  Filled 2014-08-12: qty 200

## 2014-08-12 MED ORDER — LEVOFLOXACIN IN D5W 750 MG/150ML IV SOLN
750.0000 mg | INTRAVENOUS | Status: DC
  Administered 2014-08-12 – 2014-08-14 (×3): 750 mg via INTRAVENOUS
  Filled 2014-08-12 (×3): qty 150

## 2014-08-12 MED ORDER — CETYLPYRIDINIUM CHLORIDE 0.05 % MT LIQD
7.0000 mL | Freq: Two times a day (BID) | OROMUCOSAL | Status: DC
  Administered 2014-08-12 – 2014-08-15 (×7): 7 mL via OROMUCOSAL

## 2014-08-12 MED ORDER — SODIUM CHLORIDE 0.9 % IV SOLN
8.0000 mg | Freq: Once | INTRAVENOUS | Status: AC
  Administered 2014-08-12: 8 mg via INTRAVENOUS
  Filled 2014-08-12: qty 4

## 2014-08-12 MED ORDER — VANCOMYCIN HCL 500 MG IV SOLR
500.0000 mg | Freq: Two times a day (BID) | INTRAVENOUS | Status: DC
  Administered 2014-08-12 – 2014-08-13 (×2): 500 mg via INTRAVENOUS
  Filled 2014-08-12 (×2): qty 500

## 2014-08-12 MED ORDER — PANTOPRAZOLE SODIUM 40 MG PO TBEC
40.0000 mg | DELAYED_RELEASE_TABLET | Freq: Every day | ORAL | Status: DC
  Administered 2014-08-13 – 2014-08-15 (×3): 40 mg via ORAL
  Filled 2014-08-12 (×3): qty 1

## 2014-08-12 NOTE — Progress Notes (Signed)
Lantus held in ED due to CBG 75.

## 2014-08-12 NOTE — Progress Notes (Signed)
Patient admitted to room 1323 from ED with N/V. Just completed cycle of chemo yesterday. Oriented to room and unit routines.

## 2014-08-12 NOTE — Progress Notes (Addendum)
[facility] PROGRESS NOTE  Michelle Erickson CWC:[id number] DOB: Oct 31, 1955 DOA: 08/11/2014 PCP: Sherrie Mustache, MD  Summary I have seen and examined Michelle Erickson at bedside and reviewed her chart. I also discussed with her husband. Michelle Erickson is a 59 y.o. female with DM 2/recently diagnosed colon cancer who came in to the ED from the infusion center after she developed nausea and vomiting status post first chemotherapy cycle therefore concern for chemotherapy-induced nausea and vomiting but it turns out she has UTI(fever 100.74F, leukocytosis 13,000) which may have been brewing prior to first chemotherapy session and this likely contributed to the nausea and vomiting. She will therefore continue IV fluids and will have antibiotics added. There are plans for liver biopsy on Monday per oncology. May have to delay the biopsy until infection adequately controlled. She is rather depressed but denies any complaints. Will defer colon CA management to oncology. Appreciate help. Plan UTI (urinary tract infection)  Follow urine culture  Vancomycin/Levaquin Chemotherapy induced nausea and vomiting/Metastatic colon cancer to liver  Supportive care including IV fluids/antiemetics/analgesics as needed  Tentative plans for liver biopsy on Monday-will have to assess need to reschedule if infection not controlled DM (diabetes mellitus) type 2, uncontrolled, with ketoacidosis  Hemoglobin A1c 7.9  Lantus/SSI  Hold long-acting If oral intake poor Code Status: Full Family Communication: Husband at bedside Disposition Plan: Eventually home   Consultants:  Oncology  Procedures:  None  Antibiotics:  Levaquin 08/12/2014>  HPI/Subjective: Has no complaints  Objective: Filed Vitals:   08/12/14 1406  BP: 141/58  Pulse: 87  Temp: 99.8 F (37.7 C)  Resp: 18    Intake/Output Summary (Last 24 hours) at 08/12/14 1942 Last data filed at 08/12/14 1810  Gross per 24 hour  Intake  1202.92 ml  Output   1650 ml  Net -447.08 ml   Filed Weights   08/12/14 0620  Weight: 68.04 kg (150 lb)    Exam:   General:  Comfortable at rest.  Cardiovascular: S1-S2 normal. No murmurs. Pulse regular.  Respiratory: Good air entry bilaterally. No rhonchi or rales.  Abdomen: Soft and nontender. Normal bowel sounds. No organomegaly.  Musculoskeletal: No pedal edema   Neurological: Intact  Data Reviewed: Basic Metabolic Panel:  Recent Labs Lab 08/09/14 1312 08/11/14 1410 08/11/14 2047  NA 136 134* 133*  K 4.5 4.5 4.3  CL  --   --  98*  CO2 26 26 26   GLUCOSE 181* 64* 97  BUN 17.1 25.7 22*  CREATININE 0.9 1.1 0.98  CALCIUM 9.3 9.9 8.8*   Liver Function Tests:  Recent Labs Lab 08/09/14 1312 08/11/14 1410 08/11/14 2047  AST 44* 76* 70*  ALT 52 58* 51  ALKPHOS 213* 210* 178*  BILITOT 0.36 0.75 0.7  PROT 7.3 7.8 7.6  ALBUMIN 3.5 3.8 3.7   No results for input(s): LIPASE, AMYLASE in the last 168 hours. No results for input(s): AMMONIA in the last 168 hours. CBC:  Recent Labs Lab 08/11/14 1410 08/11/14 2047  WBC 13.0* 12.6*  NEUTROABS 11.9* 11.2*  HGB 10.5* 10.4*  HCT 32.5* 32.3*  MCV 79.9 80.8  PLT 244 222   Cardiac Enzymes: No results for input(s): CKTOTAL, CKMB, CKMBINDEX, TROPONINI in the last 168 hours. BNP (last 3 results) No results for input(s): BNP in the last 8760 hours.  ProBNP (last 3 results) No results for input(s): PROBNP in the last 8760 hours.  CBG:  Recent Labs Lab 08/12/14 0402 08/12/14 0731 08/12/14 0903 08/12/14 1201 08/12/14 1654  GLUCAP 76 66 139* 125* 170*    Recent Results (from the past 240 hour(s))  Blood culture (routine x 2)     Status: None (Preliminary result)   Collection Time: 08/11/14  8:02 PM  Result Value Ref Range Status   Specimen Description BLOOD LEFT CHEST  Final   Special Requests BOTTLES DRAWN AEROBIC AND ANAEROBIC 5CC  Final   Culture   Final    NO GROWTH < 24 HOURS Performed at [facility]    Report Status PENDING  Incomplete     Studies: Dg Chest Port 1 View  08/11/2014   CLINICAL DATA:  Fever and cough for 24 hours. Colon carcinoma with liver metastases  EXAM: PORTABLE CHEST - 1 VIEW  COMPARISON:  August 03, 2014  FINDINGS: Port-A-Cath tip is in the superior vena cava. No pneumothorax. Lungs are clear. Heart size and pulmonary vascularity are normal. No adenopathy. No bone lesions.  IMPRESSION: Lungs clear.  No appreciable neoplastic focus.   Electronically Signed   By: Lowella Grip III M.D.   On: 08/11/2014 20:21    Scheduled Meds: . amLODipine  5 mg Oral Daily  . antiseptic oral rinse  7 mL Mouth Rinse BID  . atorvastatin  40 mg Oral q1800  . ferrous sulfate  325 mg Oral Q breakfast  . heparin  5,000 Units Subcutaneous 3 times per day  . insulin aspart  0-9 Units Subcutaneous 6 times per day  . insulin glargine  15 Units Subcutaneous QHS  . levofloxacin (LEVAQUIN) IV  750 mg Intravenous Q24H  . losartan  50 mg Oral Daily  . vancomycin  500 mg Intravenous Q12H   Continuous Infusions: . sodium chloride 125 mL/hr at 08/12/14 1643     Time spent: 25 minutes    Estelle Skibicki  [facility] Pager [phone]. If 7PM-7AM, please contact night-coverage at [email or web address], password [facility] 08/12/2014, 7:42 PM  LOS: 0 days

## 2014-08-12 NOTE — Progress Notes (Signed)
ANTIBIOTIC CONSULT NOTE - INITIAL  Pharmacy Consult for Vancomycin Indication: Sepsis  No Known Allergies  Patient Measurements: Height: 5\' 4"  (162.6 cm) Weight: 150 lb (68.04 kg) IBW/kg (Calculated) : 54.7   Vital Signs: Temp: 99.2 F (37.3 C) (07/23 0300) Temp Source: Oral (07/23 0300) BP: 158/79 mmHg (07/23 0300) Pulse Rate: 91 (07/23 0300) Intake/Output from previous day: 07/22 0701 - 07/23 0700 In: 902.9 [P.O.:30; I.V.:822.9; IV Piggyback:50] Out: 550 [Urine:550] Intake/Output from this shift:    Labs:  Recent Labs  08/09/14 1312 08/11/14 1410 08/11/14 1410 08/11/14 2047  WBC  --  13.0*  --  12.6*  HGB  --  10.5*  --  10.4*  PLT  --  244  --  222  CREATININE 0.9  --  1.1 0.98   Estimated Creatinine Clearance: 57.1 mL/min (by C-G formula based on Cr of 0.98). No results for input(s): VANCOTROUGH, VANCOPEAK, VANCORANDOM, GENTTROUGH, GENTPEAK, GENTRANDOM, TOBRATROUGH, TOBRAPEAK, TOBRARND, AMIKACINPEAK, AMIKACINTROU, AMIKACIN in the last 72 hours.   Microbiology: Recent Results (from the past 720 hour(s))  Urine culture     Status: None   Collection Time: 07/13/14  8:50 PM  Result Value Ref Range Status   Specimen Description URINE, RANDOM  Final   Special Requests NONE  Final   Culture   Final    MULTIPLE SPECIES PRESENT, SUGGEST RECOLLECTION IF CLINICALLY INDICATED   Report Status 07/15/2014 FINAL  Final    Medical History: Past Medical History  Diagnosis Date  . Hyperlipemia   . Diabetes mellitus without complication   . Hypertension   . Metastatic colon cancer to liver 07/21/2014  . Wears glasses     contacts/glasses  . Snoring   . Back pain   . Family history of colon cancer     Medications:  Anti-infectives    Start     Dose/Rate Route Frequency Ordered Stop   08/12/14 0800  levofloxacin (LEVAQUIN) IVPB 750 mg     750 mg 100 mL/hr over 90 Minutes Intravenous Every 24 hours 08/12/14 0733 08/19/14 0759   08/11/14 2315  cefTRIAXone  (ROCEPHIN) 1 g in dextrose 5 % 50 mL IVPB  Status:  Discontinued     1 g 100 mL/hr over 30 Minutes Intravenous  Once 08/11/14 2306 08/11/14 2354     Assessment: 9 yoF admitted from [facility] with emesis unresponsive to ondansetron.  Diagnosed recently with colon cancer with mets to liver and completed her first cycle of FOLFOX chemo on 7/20.  PAC was placed 08/03/14 and is without s/s infection.  Levaquin has been started per MD and pharmacy is consulted to dose vancomycin.    7/23 >> Levaquin >> 7/23 >> Vanc >>    Today, 08/12/2014:  Tm 100.6  WBC 12.6   Renal: SCr 0.98 with CrCl ~ 57 ml/min.  Blood cultures collected.   Goal of Therapy:  Vancomycin trough level 15-20 mcg/ml  Plan:   Vancomycin 1g IV x1 then 500mg  IV q12h.  Measure Vanc trough at steady state.  Follow up renal fxn, culture results, and clinical course.  Gretta Arab PharmD, BCPS Pager [phone] 08/12/2014 7:48 AM

## 2014-08-12 NOTE — Progress Notes (Signed)
Hypoglycemic Event  CBG: 66  Treatment: D50 IV 25 mL  Symptoms: None  Follow-up CBG: Time:0900 CBG Result:139  Possible Reasons for Event: Inadequate meal intake  Comments/MD notified:Pt admitted in N/V. Diet has since been advanced as tolerated    Wilkie Aye Dionne  Remember to initiate Hypoglycemia Order Set & complete

## 2014-08-13 DIAGNOSIS — D630 Anemia in neoplastic disease: Secondary | ICD-10-CM | POA: Diagnosis present

## 2014-08-13 LAB — GLUCOSE, CAPILLARY
GLUCOSE-CAPILLARY: 159 mg/dL — AB (ref 65–99)
Glucose-Capillary: 73 mg/dL (ref 65–99)
Glucose-Capillary: 160 mg/dL — ABNORMAL HIGH (ref 65–99)
Glucose-Capillary: 105 mg/dL — ABNORMAL HIGH (ref 65–99)

## 2014-08-13 LAB — CBC WITH DIFFERENTIAL/PLATELET
BASOS PCT: 0 % (ref 0–1)
Basophils Absolute: 0 10*3/uL (ref 0.0–0.1)
EOS ABS: 0.2 10*3/uL (ref 0.0–0.7)
Eosinophils Relative: 3 % (ref 0–5)
HCT: 26.6 % — ABNORMAL LOW (ref 36.0–46.0)
Hemoglobin: 8.3 g/dL — ABNORMAL LOW (ref 12.0–15.0)
Lymphocytes Relative: 18 % (ref 12–46)
Lymphs Abs: 1.3 10*3/uL (ref 0.7–4.0)
MCH: 25.5 pg — ABNORMAL LOW (ref 26.0–34.0)
MCHC: 31.2 g/dL (ref 30.0–36.0)
MCV: 81.8 fL (ref 78.0–100.0)
Monocytes Absolute: 0.1 10*3/uL (ref 0.1–1.0)
Monocytes Relative: 2 % — ABNORMAL LOW (ref 3–12)
NEUTROS PCT: 77 % (ref 43–77)
Neutro Abs: 5.5 10*3/uL (ref 1.7–7.7)
Platelets: 166 10*3/uL (ref 150–400)
RBC: 3.25 MIL/uL — ABNORMAL LOW (ref 3.87–5.11)
RDW: 14.7 % (ref 11.5–15.5)
WBC: 7.1 10*3/uL (ref 4.0–10.5)

## 2014-08-13 LAB — COMPREHENSIVE METABOLIC PANEL
ALK PHOS: 134 U/L — AB (ref 38–126)
ALT: 28 U/L (ref 14–54)
ANION GAP: 3 — AB (ref 5–15)
AST: 31 U/L (ref 15–41)
Albumin: 2.7 g/dL — ABNORMAL LOW (ref 3.5–5.0)
BILIRUBIN TOTAL: 1 mg/dL (ref 0.3–1.2)
BUN: 18 mg/dL (ref 6–20)
CHLORIDE: 109 mmol/L (ref 101–111)
CO2: 26 mmol/L (ref 22–32)
Calcium: 8.2 mg/dL — ABNORMAL LOW (ref 8.9–10.3)
Creatinine, Ser: 1.04 mg/dL — ABNORMAL HIGH (ref 0.44–1.00)
GFR calc Af Amer: >60 mL/min (ref >60)
GFR, EST NON AFRICAN AMERICAN: 57 mL/min — AB (ref >60)
Glucose, Bld: 110 mg/dL — ABNORMAL HIGH (ref 65–99)
Potassium: 4 mmol/L (ref 3.5–5.1)
Sodium: 138 mmol/L (ref 135–145)
Total Protein: 5.9 g/dL — ABNORMAL LOW (ref 6.5–8.1)

## 2014-08-13 LAB — TSH: TSH: 1.325 u[IU]/mL (ref 0.350–4.500)

## 2014-08-13 NOTE — Plan of Care (Signed)
Problem: Phase I Progression Outcomes Goal: Hemodynamically stable Outcome: Progressing Low grade fevers         

## 2014-08-13 NOTE — Progress Notes (Signed)
[facility] PROGRESS NOTE  Michelle Erickson TKW:[id number] DOB: 11-05-1955 DOA: 08/11/2014 PCP: Sherrie Mustache, MD  Summary 08/12/14: I have seen and examined Michelle Erickson at bedside and reviewed her chart. I also discussed with her husband. Michelle Erickson is a 59 y.o. female with DM 2/recently diagnosed colon cancer who came in to the ED from the infusion center after she developed nausea and vomiting status post first chemotherapy cycle therefore concern for chemotherapy-induced nausea and vomiting but it turns out she has UTI(fever 100.24F, leukocytosis 13,000) which may have been brewing prior to first chemotherapy session and this likely contributed to the nausea and vomiting. She will therefore continue IV fluids and will have antibiotics added. There are plans for liver biopsy on Monday per oncology. May have to delay the biopsy until infection adequately controlled. She is rather depressed but denies any complaints. Will defer colon CA management to oncology. Appreciate help. 08/13/14: White count improved to 7100, hemoglobin decrease from 10.4 g/dL to 8.3 g/dL-likely hemodilutional element. His increased risk for bleeding given colon CA. Will therefore discontinue heparin and start SCDs, discontinue vancomycin and continue Levaquin. We'll follow-up with oncologist tomorrow regarding liver biopsy. Patient feels better today. She is more energetic. Plan UTI (urinary tract infection)  Urine culture not sent at admission. Blood cultures remain negative.  Will discontinue Vancomycin and continue Levaquin Chemotherapy induced nausea and vomiting/Metastatic colon cancer to liver  Supportive care including IV fluids/antiemetics/analgesics as needed-tolerating feeds therefore will discontinue IV fluids  Tentative plans for liver biopsy on Monday-will have to assess need to reschedule if infection not controlled DM (diabetes mellitus) type 2, uncontrolled, with ketoacidosis  Hemoglobin A1c  7.9  Lantus/SSI Code Status: Full Family Communication: Husband at bedside Disposition Plan: Eventually home   Consultants:  Oncology  Procedures:  None  Antibiotics:  Levaquin 08/12/2014>  Vancomycin 08/12/2014> 08/13/2014  HPI/Subjective: Has no complaints  Objective: Filed Vitals:   08/13/14 0606  BP: 145/71  Pulse: 89  Temp: 98.7 F (37.1 C)  Resp: 18    Intake/Output Summary (Last 24 hours) at 08/13/14 0937 Last data filed at 08/13/14 0659  Gross per 24 hour  Intake 3455.42 ml  Output   2250 ml  Net 1205.42 ml   Filed Weights   08/12/14 0620  Weight: 68.04 kg (150 lb)    Exam:   General:  Comfortable at rest.  Cardiovascular: S1-S2 normal. No murmurs. Pulse regular.  Respiratory: Good air entry bilaterally. No rhonchi or rales.  Abdomen: Soft and nontender. Normal bowel sounds. No organomegaly.  Musculoskeletal: No pedal edema   Neurological: Intact  Data Reviewed: Basic Metabolic Panel:  Recent Labs Lab 08/09/14 1312 08/11/14 1410 08/11/14 2047 08/13/14 0558  NA 136 134* 133* 138  K 4.5 4.5 4.3 4.0  CL  --   --  98* 109  CO2 26 26 26 26   GLUCOSE 181* 64* 97 110*  BUN 17.1 25.7 22* 18  CREATININE 0.9 1.1 0.98 1.04*  CALCIUM 9.3 9.9 8.8* 8.2*   Liver Function Tests:  Recent Labs Lab 08/09/14 1312 08/11/14 1410 08/11/14 2047 08/13/14 0558  AST 44* 76* 70* 31  ALT 52 58* 51 28  ALKPHOS 213* 210* 178* 134*  BILITOT 0.36 0.75 0.7 1.0  PROT 7.3 7.8 7.6 5.9*  ALBUMIN 3.5 3.8 3.7 2.7*   No results for input(s): LIPASE, AMYLASE in the last 168 hours. No results for input(s): AMMONIA in the last 168 hours. CBC:  Recent Labs Lab 08/11/14 1410 08/11/14 2047  08/13/14 0558  WBC 13.0* 12.6* 7.1  NEUTROABS 11.9* 11.2* 5.5  HGB 10.5* 10.4* 8.3*  HCT 32.5* 32.3* 26.6*  MCV 79.9 80.8 81.8  PLT 244 222 166   Cardiac Enzymes: No results for input(s): CKTOTAL, CKMB, CKMBINDEX, TROPONINI in the last 168 hours. BNP (last 3  results) No results for input(s): BNP in the last 8760 hours.  ProBNP (last 3 results) No results for input(s): PROBNP in the last 8760 hours.  CBG:  Recent Labs Lab 08/12/14 0903 08/12/14 1201 08/12/14 1654 08/12/14 2203 08/13/14 0754  GLUCAP 139* 125* 170* 119* 73    Recent Results (from the past 240 hour(s))  Blood culture (routine x 2)     Status: None (Preliminary result)   Collection Time: 08/11/14  8:02 PM  Result Value Ref Range Status   Specimen Description BLOOD LEFT CHEST  Final   Special Requests BOTTLES DRAWN AEROBIC AND ANAEROBIC 5CC  Final   Culture   Final    NO GROWTH < 24 HOURS Performed at [facility]    Report Status PENDING  Incomplete     Studies: Dg Chest Port 1 View  08/11/2014   CLINICAL DATA:  Fever and cough for 24 hours. Colon carcinoma with liver metastases  EXAM: PORTABLE CHEST - 1 VIEW  COMPARISON:  August 03, 2014  FINDINGS: Port-A-Cath tip is in the superior vena cava. No pneumothorax. Lungs are clear. Heart size and pulmonary vascularity are normal. No adenopathy. No bone lesions.  IMPRESSION: Lungs clear.  No appreciable neoplastic focus.   Electronically Signed   By: Lowella Grip III M.D.   On: 08/11/2014 20:21    Scheduled Meds: . amLODipine  5 mg Oral Daily  . antiseptic oral rinse  7 mL Mouth Rinse BID  . atorvastatin  40 mg Oral q1800  . ferrous sulfate  325 mg Oral Q breakfast  . insulin aspart  0-9 Units Subcutaneous TID WC  . insulin glargine  15 Units Subcutaneous QHS  . levofloxacin (LEVAQUIN) IV  750 mg Intravenous Q24H  . losartan  50 mg Oral Daily  . pantoprazole  40 mg Oral Q1200   Continuous Infusions:    Time spent: 25 minutes    Michelle Erickson  [facility] Pager [phone]. If 7PM-7AM, please contact night-coverage at [email or web address], password [facility] 08/13/2014, 9:37 AM  LOS: 1 day

## 2014-08-13 NOTE — Progress Notes (Signed)
Spoke to [place] in lab to see if a urine culture was done on admission and it was not. Sample from 7/22 had already be disposed of per [place].

## 2014-08-14 ENCOUNTER — Ambulatory Visit (HOSPITAL_COMMUNITY): Admission: RE | Admit: 2014-08-14 | Payer: BLUE CROSS/BLUE SHIELD | Source: Ambulatory Visit

## 2014-08-14 LAB — HEPATIC FUNCTION PANEL
ALT: 28 U/L (ref 14–54)
AST: 30 U/L (ref 15–41)
Albumin: 2.9 g/dL — ABNORMAL LOW (ref 3.5–5.0)
Alkaline Phosphatase: 146 U/L — ABNORMAL HIGH (ref 38–126)
BILIRUBIN INDIRECT: 0.6 mg/dL (ref 0.3–0.9)
Bilirubin, Direct: 0.1 mg/dL (ref 0.1–0.5)
TOTAL PROTEIN: 6.5 g/dL (ref 6.5–8.1)
Total Bilirubin: 0.7 mg/dL (ref 0.3–1.2)

## 2014-08-14 LAB — CBC WITH DIFFERENTIAL/PLATELET
BASOS PCT: 0 % (ref 0–1)
Basophils Absolute: 0 10*3/uL (ref 0.0–0.1)
Eosinophils Absolute: 0.3 10*3/uL (ref 0.0–0.7)
Eosinophils Relative: 4 % (ref 0–5)
HCT: 27.3 % — ABNORMAL LOW (ref 36.0–46.0)
HEMOGLOBIN: 8.5 g/dL — AB (ref 12.0–15.0)
Lymphocytes Relative: 16 % (ref 12–46)
Lymphs Abs: 1.2 10*3/uL (ref 0.7–4.0)
MCH: 25.3 pg — ABNORMAL LOW (ref 26.0–34.0)
MCHC: 31.1 g/dL (ref 30.0–36.0)
MCV: 81.3 fL (ref 78.0–100.0)
MONOS PCT: 1 % — AB (ref 3–12)
Monocytes Absolute: 0.1 10*3/uL (ref 0.1–1.0)
NEUTROS ABS: 6 10*3/uL (ref 1.7–7.7)
Neutrophils Relative %: 79 % — ABNORMAL HIGH (ref 43–77)
Platelets: 176 10*3/uL (ref 150–400)
RBC: 3.36 MIL/uL — AB (ref 3.87–5.11)
RDW: 14.6 % (ref 11.5–15.5)
WBC: 7.6 10*3/uL (ref 4.0–10.5)

## 2014-08-14 LAB — PROTIME-INR
INR: 1 (ref 0.00–1.49)
Prothrombin Time: 13.4 seconds (ref 11.6–15.2)

## 2014-08-14 LAB — GLUCOSE, CAPILLARY
GLUCOSE-CAPILLARY: 136 mg/dL — AB (ref 65–99)
GLUCOSE-CAPILLARY: 128 mg/dL — AB (ref 65–99)
Glucose-Capillary: 90 mg/dL (ref 65–99)
Glucose-Capillary: 202 mg/dL — ABNORMAL HIGH (ref 65–99)

## 2014-08-14 LAB — BASIC METABOLIC PANEL
Anion gap: 5 (ref 5–15)
BUN: 18 mg/dL (ref 6–20)
CHLORIDE: 105 mmol/L (ref 101–111)
CO2: 25 mmol/L (ref 22–32)
Calcium: 8.3 mg/dL — ABNORMAL LOW (ref 8.9–10.3)
Creatinine, Ser: 0.94 mg/dL (ref 0.44–1.00)
GFR calc Af Amer: >60 mL/min (ref >60)
GFR calc non Af Amer: >60 mL/min (ref >60)
GLUCOSE: 138 mg/dL — AB (ref 65–99)
Potassium: 4 mmol/L (ref 3.5–5.1)
SODIUM: 135 mmol/L (ref 135–145)

## 2014-08-14 LAB — AMMONIA: AMMONIA: 23 umol/L (ref 9–35)

## 2014-08-14 MED ORDER — LEVOFLOXACIN 750 MG PO TABS
750.0000 mg | ORAL_TABLET | Freq: Every day | ORAL | Status: DC
  Administered 2014-08-15: 750 mg via ORAL
  Filled 2014-08-14: qty 1

## 2014-08-14 NOTE — Progress Notes (Signed)
This patient is receiving the antibiotic(s) Levaquin by the intravenous route. Based on criteria approved by the Pharmacy and Therapeutics Committee, and the Infectious Disease Division, the antibiotic(s) is / are being converted to equivalent oral dose form(s). These criteria include: . Patient being treated for a respiratory tract infection, urinary tract infection, cellulitis, or Clostridium Difficile Associated Diarrhea . The patient is not neutropenic and does not exhibit a GI malabsorption state . The patient is eating (either orally or per tube) and/or has been taking other orally administered medications for at least 24 hours. . The patient is improving clinically (physician assessment and a 24-hour Tmax of < 100.5 F).  If you have questions about this conversion, please contact the pharmacy department. Thank you.  Hershal Coria, PharmD, BCPS Pager: [phone] 08/14/2014 12:18 PM

## 2014-08-15 ENCOUNTER — Ambulatory Visit (HOSPITAL_COMMUNITY): Payer: BLUE CROSS/BLUE SHIELD

## 2014-08-15 ENCOUNTER — Other Ambulatory Visit: Payer: Self-pay | Admitting: Hematology

## 2014-08-15 DIAGNOSIS — N390 Urinary tract infection, site not specified: Principal | ICD-10-CM

## 2014-08-15 DIAGNOSIS — C787 Secondary malignant neoplasm of liver and intrahepatic bile duct: Secondary | ICD-10-CM

## 2014-08-15 DIAGNOSIS — C187 Malignant neoplasm of sigmoid colon: Secondary | ICD-10-CM

## 2014-08-15 LAB — GLUCOSE, CAPILLARY
Glucose-Capillary: 114 mg/dL — ABNORMAL HIGH (ref 65–99)
Glucose-Capillary: 170 mg/dL — ABNORMAL HIGH (ref 65–99)

## 2014-08-15 MED ORDER — HEPARIN SOD (PORK) LOCK FLUSH 100 UNIT/ML IV SOLN
500.0000 [IU] | Freq: Once | INTRAVENOUS | Status: AC
  Administered 2014-08-15: 500 [IU] via INTRAVENOUS
  Filled 2014-08-15: qty 5

## 2014-08-15 MED ORDER — LEVOFLOXACIN 750 MG PO TABS: 750.0000 mg | ORAL_TABLET | Freq: Every day | ORAL | Status: DC

## 2014-08-15 NOTE — Progress Notes (Signed)
[facility] PROGRESS NOTE  [name] OZD:[id number] DOB: 03/05/1955 DOA: 08/11/2014 PCP: Sherrie Mustache, MD  Summary 08/12/14: I have seen and examined [name] at bedside and reviewed her chart. I also discussed with her husband. Michelle Erickson is a 59 y.o. female with DM 2/recently diagnosed colon cancer who came in to the ED from the infusion center after she developed nausea and vomiting status post first chemotherapy cycle therefore concern for chemotherapy-induced nausea and vomiting but it turns out she has UTI(fever 100.19F, leukocytosis 13,000) which may have been brewing prior to first chemotherapy session and this likely contributed to the nausea and vomiting. She will therefore continue IV fluids and will have antibiotics added. There are plans for liver biopsy on Monday per oncology. May have to delay the biopsy until infection adequately controlled. She is rather depressed but denies any complaints. Will defer colon CA management to oncology. Appreciate help. 08/13/14: White count improved to 7100, hemoglobin decrease from 10.4 g/dL to 8.3 g/dL-likely hemodilutional element. His increased risk for bleeding given colon CA. Will therefore discontinue heparin and start SCDs, discontinue vancomycin and continue Levaquin. We'll follow-up with oncologist tomorrow regarding liver biopsy. Patient feels better today. She is more energetic. 08/14/2014: Eating better. Has more energy. Continue current antibiotics and follow oncology. Plan UTI (urinary tract infection)  Urine culture/ Blood cultures remain negative.  Continue Levaquin Chemotherapy induced nausea and vomiting/Metastatic colon cancer to liver  Supportive care including antiemetics/analgesics as needed  Tentative plans for liver biopsy on Monday-will have to assess need to reschedule if infection not controlled DM (diabetes mellitus) type 2, uncontrolled, with ketoacidosis  Hemoglobin A1c 7.9  Lantus/SSI Code  Status: Full Family Communication: Husband at bedside Disposition Plan: Eventually home   Consultants:  Oncology  Procedures:  None  Antibiotics:  Levaquin 08/12/2014>  Vancomycin 08/12/2014> 08/13/2014  HPI/Subjective: Has no complaints  Objective:  Objective: Filed Vitals:   08/15/14 1423  BP: 118/62  Pulse: 100  Temp: 98 F (36.7 C)  Resp: 16    Intake/Output Summary (Last 24 hours) at 08/15/14 1839 Last data filed at 08/15/14 1423  Gross per 24 hour  Intake    720 ml  Output    800 ml  Net    -80 ml   Filed Weights   08/12/14 0620  Weight: 68.04 kg (150 lb)    Exam:   General:  Comfortable at rest.  Cardiovascular: S1-S2 normal. No murmurs. Pulse regular.  Respiratory: Good air entry bilaterally. No rhonchi or rales.  Abdomen: Soft and nontender. Normal bowel sounds. No organomegaly.  Musculoskeletal: No pedal edema   Neurological: Intact  Data Reviewed: Basic Metabolic Panel:  Recent Labs Lab 08/09/14 1312 08/11/14 1410 08/11/14 2047 08/13/14 0558 08/14/14 0435  NA 136 134* 133* 138 135  K 4.5 4.5 4.3 4.0 4.0  CL  --   --  98* 109 105  CO2 26 26 26 26 25   GLUCOSE 181* 64* 97 110* 138*  BUN 17.1 25.7 22* 18 18  CREATININE 0.9 1.1 0.98 1.04* 0.94  CALCIUM 9.3 9.9 8.8* 8.2* 8.3*   Liver Function Tests:  Recent Labs Lab 08/09/14 1312 08/11/14 1410 08/11/14 2047 08/13/14 0558 08/14/14 0435  AST 44* 76* 70* 31 30  ALT 52 58* 51 28 28  ALKPHOS 213* 210* 178* 134* 146*  BILITOT 0.36 0.75 0.7 1.0 0.7  PROT 7.3 7.8 7.6 5.9* 6.5  ALBUMIN 3.5 3.8 3.7 2.7* 2.9*   No results for input(s): LIPASE, AMYLASE  in the last 168 hours.  Recent Labs Lab 08/14/14 0435  AMMONIA 23   CBC:  Recent Labs Lab 08/11/14 1410 08/11/14 2047 08/13/14 0558 08/14/14 0435  WBC 13.0* 12.6* 7.1 7.6  NEUTROABS 11.9* 11.2* 5.5 6.0  HGB 10.5* 10.4* 8.3* 8.5*  HCT 32.5* 32.3* 26.6* 27.3*  MCV 79.9 80.8 81.8 81.3  PLT 244 222 166 176    Cardiac Enzymes: No results for input(s): CKTOTAL, CKMB, CKMBINDEX, TROPONINI in the last 168 hours. BNP (last 3 results) No results for input(s): BNP in the last 8760 hours.  ProBNP (last 3 results) No results for input(s): PROBNP in the last 8760 hours.  CBG:  Recent Labs Lab 08/14/14 1217 08/14/14 1722 08/14/14 2125 08/15/14 0719 08/15/14 1205  GLUCAP 136* 128* 202* 114* 170*    Recent Results (from the past 240 hour(s))  Blood culture (routine x 2)     Status: None (Preliminary result)   Collection Time: 08/11/14  8:02 PM  Result Value Ref Range Status   Specimen Description BLOOD LEFT CHEST  Final   Special Requests BOTTLES DRAWN AEROBIC AND ANAEROBIC 5CC  Final   Culture   Final    NO GROWTH 4 DAYS Performed at [facility]    Report Status PENDING  Incomplete  Blood culture (routine x 2)     Status: None (Preliminary result)   Collection Time: 08/11/14  9:18 PM  Result Value Ref Range Status   Specimen Description BLOOD RIGHT HAND  Final   Special Requests BOTTLES DRAWN AEROBIC AND ANAEROBIC 5CC  Final   Culture   Final    NO GROWTH 3 DAYS Performed at [facility]    Report Status PENDING  Incomplete     Studies: No results found.  Scheduled Meds: . amLODipine  5 mg Oral Daily  . antiseptic oral rinse  7 mL Mouth Rinse BID  . atorvastatin  40 mg Oral q1800  . ferrous sulfate  325 mg Oral Q breakfast  . insulin aspart  0-9 Units Subcutaneous TID WC  . insulin glargine  15 Units Subcutaneous QHS  . levofloxacin  750 mg Oral Daily  . losartan  50 mg Oral Daily  . pantoprazole  40 mg Oral Q1200   Continuous Infusions:    Time spent: 25 minutes    Calayah Guadarrama  [facility] Pager [phone]. If 7PM-7AM, please contact night-coverage at [email or web address], password [facility] 08/15/2014, 6:39 PM  LOS: 3 days

## 2014-08-15 NOTE — Discharge Summary (Signed)
Michelle Erickson, is a 59 y.o. female  DOB [birth year]  MRN [id number].  Admission date:  08/11/2014  Admitting Physician  Etta Quill, DO  Discharge Date:  08/15/2014   Primary MD  Sherrie Mustache, MD  Recommendations for primary care physician for things to follow:  Please follow complete resolution of UTI prior to liver biopsy.   Admission Diagnosis   Cough [R05]   Discharge Diagnosis  Cough [R05]   Active Problems:   Metastatic colon cancer to liver   DM (diabetes mellitus), type 2   Anemia in neoplastic disease   Summary   Michelle Erickson is a pleasant 59 year old female with DM 2/recently diagnosed colon CA started on chemotherapy recently/essential hypertension who came in with nausea vomiting and feeling sick after chemotherapy infusion and she was found to have UTI with fever and leukocytosis. However, her urine culture/blood culture was negative. She responded to empiric antibiotics which will be narrowed to Levaquin prior to discharge. She is back to her baseline. Patient was seen by oncology regarding planned liver biopsy which was postponed in view of ongoing infection and will have to be rescheduled to next visit. Patient is stable for discharge today to complete course of antibiotics. Please refer to the hospital course summary below for day to day developments during the hospital stay. Hospital Course  08/12/14: I have seen and examined Ms. Stormer at bedside and reviewed her chart. I also discussed with her husband. AMRA Erickson is a 59 y.o. female with DM 2/recently diagnosed colon cancer who came in to the ED from the infusion center after she developed nausea and vomiting status post first chemotherapy cycle therefore concern for chemotherapy-induced nausea and vomiting but it turns out she has  UTI(fever 100.11F, leukocytosis 13,000) which may have been brewing prior to first chemotherapy session and this likely contributed to the nausea and vomiting. She will therefore continue IV fluids and will have antibiotics added. There are plans for liver biopsy on Monday per oncology. May have to delay the biopsy until infection adequately controlled. She is rather depressed but denies any complaints. Will defer colon CA management to oncology. Appreciate help. 08/13/14: White count improved to 7100, hemoglobin decrease from 10.4 g/dL to 8.3 g/dL-likely hemodilutional element. His increased risk for bleeding given colon CA. Will therefore discontinue heparin and start SCDs, discontinue vancomycin and continue Levaquin. We'll follow-up with oncologist tomorrow regarding liver biopsy. Patient feels better today. She is more energetic. 08/14/2014: Eating better. Has more energy. Continue current antibiotics and follow oncology.   Discharge Condition Stable.  Consults obtained  None  Follow UP     Discharge Instructions  and  Discharge Medications  Discharge Instructions    Diet - low sodium heart healthy    Complete by:  As directed      Diet Carb Modified    Complete by:  As directed      Increase activity slowly    Complete by:  As directed  Medication List    TAKE these medications        amLODipine 5 MG tablet  Commonly known as:  NORVASC  Take 5 mg by mouth daily.     COZAAR 50 MG tablet  Generic drug:  losartan  Take 50 mg by mouth.     ferrous sulfate 325 (65 FE) MG tablet  Take 325 mg by mouth daily with breakfast.     insulin glargine 100 UNIT/ML injection  Commonly known as:  LANTUS  Inject 28 Units into the skin at bedtime.     insulin lispro 100 UNIT/ML injection  Commonly known as:  HUMALOG  Inject 4-8 Units into the skin 2 (two) times daily as needed for high blood sugar. Sliding scale     levofloxacin 750 MG tablet  Commonly known as:   LEVAQUIN  Take 1 tablet (750 mg total) by mouth daily.     lidocaine-prilocaine cream  Commonly known as:  EMLA  Apply to affected area once     LIPITOR 40 MG tablet  Generic drug:  atorvastatin  Take 40 mg by mouth daily.     LORazepam 0.5 MG tablet  Commonly known as:  ATIVAN  Take 1 tablet (0.5 mg total) by mouth every 6 (six) hours as needed for anxiety.     ondansetron 8 MG disintegrating tablet  Commonly known as:  ZOFRAN-ODT  Take 8 mg by mouth every 8 (eight) hours as needed for nausea or vomiting.     oxyCODONE-acetaminophen 5-325 MG per tablet  Commonly known as:  ROXICET  Take 1-2 tablets by mouth every 4 (four) hours as needed for severe pain.     PRESCRIPTION MEDICATION  Chemo        Diet and Activity recommendation: See Discharge Instructions above  Major procedures and Radiology Reports - PLEASE review detailed and final reports for all details, in brief -    Ct Chest W Contrast  07/28/2014   CLINICAL DATA:  Subsequent encounter for metastatic colon cancer  EXAM: CT CHEST WITH CONTRAST  TECHNIQUE: Multidetector CT imaging of the chest was performed during intravenous contrast administration.  CONTRAST:  61mL OMNIPAQUE IOHEXOL 300 MG/ML  SOLN  COMPARISON:  Abdomen and pelvis CT from 07/14/2014.  FINDINGS: Mediastinum / Lymph Nodes: No axillary lymphadenopathy. No mediastinal or hilar lymphadenopathy. There is some fluid in the distal esophagus which may be related to reflux or dysmotility. Heart size is normal. Coronary artery calcification is noted. No pericardial effusion.  Lungs / Pleura: Lungs are clear without pulmonary nodule or mass. No focal airspace consolidation. No pleural effusion. There is some minimal dependent atelectasis in the lower lobes bilaterally.  MSK / Soft Tissues: Bone windows reveal no worrisome lytic or sclerotic osseous lesions.  Upper Abdomen: Imaging of the upper liver again shows bulky metastatic disease within the liver parenchyma. Liver  has been incompletely visualized. Two lesions in the anterior hepatic dome or stable. The more anterior measures 2.3 cm today compared to 2.3 cm previously. The more posterior of the 2 is 19 mm today compared to 20 mm previously. 21 mm calcified gallstone is evident.  IMPRESSION: 1. No evidence for metastatic disease in the chest. 2. Bulky metastatic disease in the liver as seen on the previous abdomen and pelvis CT scan. Visualized hepatic lesions show no substantial interval change.   Electronically Signed   By: Misty Stanley M.D.   On: 07/28/2014 14:52   Dg Chest Port 1 View  08/11/2014  CLINICAL DATA:  Fever and cough for 24 hours. Colon carcinoma with liver metastases  EXAM: PORTABLE CHEST - 1 VIEW  COMPARISON:  August 03, 2014  FINDINGS: Port-A-Cath tip is in the superior vena cava. No pneumothorax. Lungs are clear. Heart size and pulmonary vascularity are normal. No adenopathy. No bone lesions.  IMPRESSION: Lungs clear.  No appreciable neoplastic focus.   Electronically Signed   By: Lowella Grip III M.D.   On: 08/11/2014 20:21   Dg Chest Port 1 View  08/03/2014   CLINICAL DATA:  Port-A-Cath placement.  EXAM: PORTABLE CHEST - 1 VIEW  COMPARISON:  November 18, 2010.  FINDINGS: The heart size and mediastinal contours are within normal limits. Both lungs are clear. Hypoinflation is noted. No pneumothorax or pleural effusion is noted. Interval placement of left subclavian Port-A-Cath with distal tip overlying expected position of the SVC. The visualized skeletal structures are unremarkable.  IMPRESSION: Interval placement of left subclavian Port-A-Cath with distal tip overlying expected position of the SVC. No pneumothorax is noted   Electronically Signed   By: Marijo Conception, M.D.   On: 08/03/2014 13:26   Dg Fluoro Guide Cv Line-no Report  08/03/2014   CLINICAL DATA:    FLOURO GUIDE CV LINE  Fluoroscopy was utilized by the requesting physician.  No radiographic  interpretation.     Micro Results     Recent Results (from the past 240 hour(s))  Blood culture (routine x 2)     Status: None (Preliminary result)   Collection Time: 08/11/14  8:02 PM  Result Value Ref Range Status   Specimen Description BLOOD LEFT CHEST  Final   Special Requests BOTTLES DRAWN AEROBIC AND ANAEROBIC 5CC  Final   Culture   Final    NO GROWTH 4 DAYS Performed at [facility]    Report Status PENDING  Incomplete  Blood culture (routine x 2)     Status: None (Preliminary result)   Collection Time: 08/11/14  9:18 PM  Result Value Ref Range Status   Specimen Description BLOOD RIGHT HAND  Final   Special Requests BOTTLES DRAWN AEROBIC AND ANAEROBIC 5CC  Final   Culture   Final    NO GROWTH 3 DAYS Performed at [facility]    Report Status PENDING  Incomplete       Today   Subjective:   Jackie Plum today has no headache,no chest abdominal pain,no new weakness tingling or numbness, feels much better wants to go home today.   Objective:   Blood pressure 118/62, pulse 100, temperature 98 F (36.7 C), temperature source Oral, resp. rate 16, height 5\' 4"  (1.626 m), weight 68.04 kg (150 lb), SpO2 100 %.   Intake/Output Summary (Last 24 hours) at 08/15/14 1842 Last data filed at 08/15/14 1423  Gross per 24 hour  Intake    720 ml  Output    800 ml  Net    -80 ml    Exam Awake Alert, Oriented x 3, No new F.N deficits, Normal affect [place].AT,PERRAL Supple Neck,No JVD, No cervical lymphadenopathy appriciated.  Symmetrical Chest wall movement, Good air movement bilaterally, CTAB RRR,No Gallops,Rubs or new Murmurs, No Parasternal Heave +ve B.Sounds, Abd Soft, Non tender, No organomegaly appriciated, No rebound -guarding or rigidity. No Cyanosis, Clubbing or edema, No new Rash or bruise  Data Review   CBC w Diff: Lab Results  Component Value Date   WBC 7.6 08/14/2014   WBC 13.0* 08/11/2014   HGB 8.5* 08/14/2014  HGB 10.5* 08/11/2014   HCT 27.3* 08/14/2014   HCT 32.5* 08/11/2014    PLT 176 08/14/2014   PLT 244 08/11/2014   LYMPHOPCT 16 08/14/2014   LYMPHOPCT 5.1* 08/11/2014   MONOPCT 1* 08/14/2014   MONOPCT 3.4 08/11/2014   EOSPCT 4 08/14/2014   EOSPCT 0.0 08/11/2014   BASOPCT 0 08/14/2014   BASOPCT 0.0 08/11/2014    CMP: Lab Results  Component Value Date   NA 135 08/14/2014   NA 134* 08/11/2014   K 4.0 08/14/2014   K 4.5 08/11/2014   CL 105 08/14/2014   CO2 25 08/14/2014   CO2 26 08/11/2014   BUN 18 08/14/2014   BUN 25.7 08/11/2014   CREATININE 0.94 08/14/2014   CREATININE 1.1 08/11/2014   GLU 67* 08/11/2014   PROT 6.5 08/14/2014   PROT 7.8 08/11/2014   ALBUMIN 2.9* 08/14/2014   ALBUMIN 3.8 08/11/2014   BILITOT 0.7 08/14/2014   BILITOT 0.75 08/11/2014   ALKPHOS 146* 08/14/2014   ALKPHOS 210* 08/11/2014   AST 30 08/14/2014   AST 76* 08/11/2014   ALT 28 08/14/2014   ALT 58* 08/11/2014  .   Total Time in preparing paper work, data evaluation and todays exam - 25 minutes  Ayianna Darnold M.D on 08/15/2014 at [facility] Office  [phone]

## 2014-08-15 NOTE — Progress Notes (Signed)
Michelle Erickson   DOB:11-10-1955   RU#:[id number]   BJY#:[id number]  Subjective: patient is well-known to me, she recently started chemotherapy for her metastatic colon cancer. She was admitted last Friday for nausea and fever, and has been treated for UTI. She feels much better in the past few days. She was eating lunch when I saw her today around noon.    Objective:  Filed Vitals:   08/15/14 1423  BP: 118/62  Pulse: 100  Temp: 98 F (36.7 C)  Resp: 16    Body mass index is 25.73 kg/(m^2).  Intake/Output Summary (Last 24 hours) at 08/15/14 1850 Last data filed at 08/15/14 1423  Gross per 24 hour  Intake    480 ml  Output    800 ml  Net   -320 ml     Sclerae unicteric  Oropharynx clear  No peripheral adenopathy  Lungs clear -- no rales or rhonchi  Heart regular rate and rhythm  Abdomen benign  MSK no focal spinal tenderness, no peripheral edema  Neuro nonfocal   CBG (last 3)   Recent Labs  08/14/14 2125 08/15/14 0719 08/15/14 1205  GLUCAP 202* 114* 170*     Labs:  Lab Results  Component Value Date   WBC 7.6 08/14/2014   HGB 8.5* 08/14/2014   HCT 27.3* 08/14/2014   MCV 81.3 08/14/2014   PLT 176 08/14/2014   NEUTROABS 6.0 08/14/2014    @LASTCHEMISTRY @  Urine Studies No results for input(s): UHGB, CRYS in the last 72 hours.  Invalid input(s): UACOL, UAPR, USPG, UPH, UTP, UGL, [place], UBIL, UNIT, UROB, [place], UEPI, UWBC, [place], [place]  Basic Metabolic Panel:  Recent Labs Lab 08/09/14 1312 08/11/14 1410  08/11/14 2047 08/13/14 0558 08/14/14 0435  NA 136 134*  --  133* 138 135  K 4.5 4.5  < > 4.3 4.0 4.0  CL  --   --   --  98* 109 105  CO2 26 26  --  26 26 25   GLUCOSE 181* 64*  --  97 110* 138*  BUN 17.1 25.7  --  22* 18 18  CREATININE 0.9 1.1  --  0.98 1.04* 0.94  CALCIUM 9.3 9.9  --  8.8* 8.2* 8.3*  < > = values in this interval not displayed. GFR Estimated Creatinine Clearance: 59.5 mL/min (by C-G formula based on Cr of  0.94). Liver Function Tests:  Recent Labs Lab 08/09/14 1312 08/11/14 1410 08/11/14 2047 08/13/14 0558 08/14/14 0435  AST 44* 76* 70* 31 30  ALT 52 58* 51 28 28  ALKPHOS 213* 210* 178* 134* 146*  BILITOT 0.36 0.75 0.7 1.0 0.7  PROT 7.3 7.8 7.6 5.9* 6.5  ALBUMIN 3.5 3.8 3.7 2.7* 2.9*   No results for input(s): LIPASE, AMYLASE in the last 168 hours.  Recent Labs Lab 08/14/14 0435  AMMONIA 23   Coagulation profile  Recent Labs Lab 08/14/14 0435  INR 1.00    CBC:  Recent Labs Lab 08/11/14 1410 08/11/14 2047 08/13/14 0558 08/14/14 0435  WBC 13.0* 12.6* 7.1 7.6  NEUTROABS 11.9* 11.2* 5.5 6.0  HGB 10.5* 10.4* 8.3* 8.5*  HCT 32.5* 32.3* 26.6* 27.3*  MCV 79.9 80.8 81.8 81.3  PLT 244 222 166 176   Cardiac Enzymes: No results for input(s): CKTOTAL, CKMB, CKMBINDEX, TROPONINI in the last 168 hours. BNP: Invalid input(s): POCBNP CBG:  Recent Labs Lab 08/14/14 1217 08/14/14 1722 08/14/14 2125 08/15/14 0719 08/15/14 1205  GLUCAP 136* 128* 202* 114* 170*  D-Dimer No results for input(s): DDIMER in the last 72 hours. Hgb A1c No results for input(s): HGBA1C in the last 72 hours. Lipid Profile No results for input(s): CHOL, HDL, LDLCALC, TRIG, CHOLHDL, LDLDIRECT in the last 72 hours. Thyroid function studies  Recent Labs  08/13/14 0558  TSH 1.325   Anemia work up No results for input(s): VITAMINB12, FOLATE, FERRITIN, TIBC, IRON, RETICCTPCT in the last 72 hours. Microbiology Recent Results (from the past 240 hour(s))  Blood culture (routine x 2)     Status: None (Preliminary result)   Collection Time: 08/11/14  8:02 PM  Result Value Ref Range Status   Specimen Description BLOOD LEFT CHEST  Final   Special Requests BOTTLES DRAWN AEROBIC AND ANAEROBIC 5CC  Final   Culture   Final    NO GROWTH 4 DAYS Performed at [facility]    Report Status PENDING  Incomplete  Blood culture (routine x 2)     Status: None (Preliminary result)   Collection  Time: 08/11/14  9:18 PM  Result Value Ref Range Status   Specimen Description BLOOD RIGHT HAND  Final   Special Requests BOTTLES DRAWN AEROBIC AND ANAEROBIC 5CC  Final   Culture   Final    NO GROWTH 3 DAYS Performed at [facility]    Report Status PENDING  Incomplete      Studies:  No results found.  Assessment: 59 y.o. with newly diagnosed stag IV colon cancer, s/p first cycle FOLFOX    Plan:  -will hold on her liver biopsy for now. -she has recovered well, likely will be discharged home soon -I will see her back next week before her second cycle chemo, appointments are scheduled      Truitt Merle, MD 08/15/2014  6:50 PM

## 2014-08-15 NOTE — Progress Notes (Signed)
Discharge instructions reviewed with pt. No current questions or concerns. Pt is stable. Will be leaving shortly

## 2014-08-16 ENCOUNTER — Telehealth: Payer: Self-pay | Admitting: Hematology

## 2014-08-16 ENCOUNTER — Other Ambulatory Visit: Payer: BLUE CROSS/BLUE SHIELD

## 2014-08-16 LAB — GLUCOSE, CAPILLARY: Glucose-Capillary: 189 mg/dL — ABNORMAL HIGH (ref 65–99)

## 2014-08-16 LAB — CULTURE, BLOOD (ROUTINE X 2): Culture: NO GROWTH

## 2014-08-16 NOTE — Telephone Encounter (Signed)
per pof to sch pt appt-cld & spoke to pt-gave pt time & date of appt for 8/3

## 2014-08-17 ENCOUNTER — Ambulatory Visit: Payer: BLUE CROSS/BLUE SHIELD | Admitting: Hematology

## 2014-08-17 LAB — CULTURE, BLOOD (ROUTINE X 2): Culture: NO GROWTH

## 2014-08-17 LAB — CULTURE, BLOOD (SINGLE)

## 2014-08-21 ENCOUNTER — Ambulatory Visit: Payer: BLUE CROSS/BLUE SHIELD

## 2014-08-22 ENCOUNTER — Other Ambulatory Visit: Payer: Self-pay | Admitting: *Deleted

## 2014-08-23 ENCOUNTER — Telehealth: Payer: Self-pay | Admitting: Hematology

## 2014-08-23 ENCOUNTER — Other Ambulatory Visit (HOSPITAL_BASED_OUTPATIENT_CLINIC_OR_DEPARTMENT_OTHER): Payer: BLUE CROSS/BLUE SHIELD

## 2014-08-23 ENCOUNTER — Ambulatory Visit (HOSPITAL_BASED_OUTPATIENT_CLINIC_OR_DEPARTMENT_OTHER): Payer: BLUE CROSS/BLUE SHIELD | Admitting: Hematology

## 2014-08-23 ENCOUNTER — Telehealth: Payer: Self-pay | Admitting: *Deleted

## 2014-08-23 ENCOUNTER — Ambulatory Visit: Payer: BLUE CROSS/BLUE SHIELD | Admitting: Nutrition

## 2014-08-23 ENCOUNTER — Ambulatory Visit (HOSPITAL_BASED_OUTPATIENT_CLINIC_OR_DEPARTMENT_OTHER): Payer: BLUE CROSS/BLUE SHIELD

## 2014-08-23 ENCOUNTER — Encounter: Payer: Self-pay | Admitting: Hematology

## 2014-08-23 VITALS — BP 132/72 | HR 90 | Temp 98.4°F | Resp 18 | Ht 64.0 in | Wt 151.9 lb

## 2014-08-23 DIAGNOSIS — C189 Malignant neoplasm of colon, unspecified: Secondary | ICD-10-CM

## 2014-08-23 DIAGNOSIS — C787 Secondary malignant neoplasm of liver and intrahepatic bile duct: Secondary | ICD-10-CM

## 2014-08-23 DIAGNOSIS — Z5111 Encounter for antineoplastic chemotherapy: Secondary | ICD-10-CM | POA: Diagnosis not present

## 2014-08-23 DIAGNOSIS — E119 Type 2 diabetes mellitus without complications: Secondary | ICD-10-CM

## 2014-08-23 DIAGNOSIS — C187 Malignant neoplasm of sigmoid colon: Secondary | ICD-10-CM

## 2014-08-23 DIAGNOSIS — D630 Anemia in neoplastic disease: Secondary | ICD-10-CM

## 2014-08-23 DIAGNOSIS — D500 Iron deficiency anemia secondary to blood loss (chronic): Secondary | ICD-10-CM

## 2014-08-23 DIAGNOSIS — F329 Major depressive disorder, single episode, unspecified: Secondary | ICD-10-CM

## 2014-08-23 DIAGNOSIS — I10 Essential (primary) hypertension: Secondary | ICD-10-CM

## 2014-08-23 LAB — COMPREHENSIVE METABOLIC PANEL (CC13)
ALBUMIN: 3.4 g/dL — AB (ref 3.5–5.0)
ALT: 29 U/L (ref 0–55)
AST: 37 U/L — AB (ref 5–34)
Alkaline Phosphatase: 190 U/L — ABNORMAL HIGH (ref 40–150)
Anion Gap: 6 mEq/L (ref 3–11)
BUN: 11.6 mg/dL (ref 7.0–26.0)
CO2: 27 meq/L (ref 22–29)
Calcium: 8.9 mg/dL (ref 8.4–10.4)
Chloride: 104 mEq/L (ref 98–109)
Creatinine: 1 mg/dL (ref 0.6–1.1)
EGFR: 69 mL/min/{1.73_m2} — ABNORMAL LOW (ref >90)
Glucose: 142 mg/dl — ABNORMAL HIGH (ref 70–140)
Potassium: 4.8 mEq/L (ref 3.5–5.1)
Sodium: 138 mEq/L (ref 136–145)
Total Bilirubin: 0.26 mg/dL (ref 0.20–1.20)
Total Protein: 6.8 g/dL (ref 6.4–8.3)

## 2014-08-23 LAB — CBC WITH DIFFERENTIAL/PLATELET
BASO%: 0.8 % (ref 0.0–2.0)
Basophils Absolute: 0 10*3/uL (ref 0.0–0.1)
EOS%: 5.6 % (ref 0.0–7.0)
Eosinophils Absolute: 0.2 10*3/uL (ref 0.0–0.5)
HEMATOCRIT: 28.4 % — AB (ref 34.8–46.6)
HGB: 9.1 g/dL — ABNORMAL LOW (ref 11.6–15.9)
LYMPH#: 0.9 10*3/uL (ref 0.9–3.3)
LYMPH%: 25.9 % (ref 14.0–49.7)
MCH: 26 pg (ref 25.1–34.0)
MCHC: 32 g/dL (ref 31.5–36.0)
MCV: 81 fL (ref 79.5–101.0)
MONO#: 0.6 10*3/uL (ref 0.1–0.9)
MONO%: 16.1 % — ABNORMAL HIGH (ref 0.0–14.0)
NEUT#: 1.8 10*3/uL (ref 1.5–6.5)
NEUT%: 51.6 % (ref 38.4–76.8)
Platelets: 223 10*3/uL (ref 145–400)
RBC: 3.5 10*6/uL — AB (ref 3.70–5.45)
RDW: 16.1 % — AB (ref 11.2–14.5)
WBC: 3.5 10*3/uL — AB (ref 3.9–10.3)

## 2014-08-23 MED ORDER — FLUOROURACIL CHEMO INJECTION 2.5 GM/50ML
400.0000 mg/m2 | Freq: Once | INTRAVENOUS | Status: AC
  Administered 2014-08-23: 700 mg via INTRAVENOUS
  Filled 2014-08-23: qty 14

## 2014-08-23 MED ORDER — DEXTROSE 5 % IV SOLN
Freq: Once | INTRAVENOUS | Status: AC
  Administered 2014-08-23: 14:00:00 via INTRAVENOUS

## 2014-08-23 MED ORDER — FLUOROURACIL CHEMO INJECTION 5 GM/100ML
2400.0000 mg/m2 | INTRAVENOUS | Status: DC
  Administered 2014-08-23: 4300 mg via INTRAVENOUS
  Filled 2014-08-23: qty 86

## 2014-08-23 MED ORDER — OXALIPLATIN CHEMO INJECTION 100 MG/20ML
85.0000 mg/m2 | Freq: Once | INTRAVENOUS | Status: AC
  Administered 2014-08-23: 150 mg via INTRAVENOUS
  Filled 2014-08-23: qty 30

## 2014-08-23 MED ORDER — DEXTROSE 5 % IV SOLN
402.0000 mg/m2 | Freq: Once | INTRAVENOUS | Status: AC
  Administered 2014-08-23: 720 mg via INTRAVENOUS
  Filled 2014-08-23: qty 36

## 2014-08-23 MED ORDER — SODIUM CHLORIDE 0.9 % IV SOLN
Freq: Once | INTRAVENOUS | Status: AC
  Administered 2014-08-23: 14:00:00 via INTRAVENOUS
  Filled 2014-08-23: qty 4

## 2014-08-23 NOTE — Progress Notes (Signed)
Nutrition follow-up completed with patient during chemotherapy for metastatic colon cancer. Weight has decreased and was documented as 151 pounds August 3, decreased from 156 pounds July 1. Patient reports she feels well. Her nausea is controlled with antiemetics. Patient is eating well and declines nutrition supplements at this time.  Nutrition diagnosis: Unintended weight loss continues.  Intervention: Educated patient to increase meals and snacks throughout the day to promote weight maintenance. Recommended patient try milk shakes or smoothies as desired to add additional protein. Questions were answered and teach back method was used.  Monitoring, evaluation, goals: Patient will work to increase calories and protein to minimize further weight loss.  Next visit: To be scheduled as needed.  Patient has my contact information.  **Disclaimer: This note was dictated with voice recognition software. Similar sounding words can inadvertently be transcribed and this note may contain transcription errors which may not have been corrected upon publication of note.**

## 2014-08-23 NOTE — Progress Notes (Signed)
[facility]  [phone] [phone]  Clinic New Consult Note   Patient Care Team: Joette Catching, MD as PCP - General (Family Medicine) Wandalee Ferdinand, RN as Registered Nurse (Oncology) 08/23/2014  CHIEF COMPLAINTS:  Follow-up of metastatic colon cancer  Oncology History   Metastatic colon cancer to liver   Staging form: Colon and Rectum, AJCC 7th Edition     Clinical: Stage Unknown (TX, NX, M1) - Unsigned        Metastatic colon cancer to liver   07/14/2014 Imaging CT abdomen and pelvis with contrast showed a 6 cm segment of sigmoid bowel wall thickening with adjacent 14 x 12 mm nodules, diffuse hepatic metastasis. No bowel obstruction.   07/14/2014 Initial Diagnosis Metastatic colon cancer to liver   07/25/2014 Procedure Colonoscopy showed a near circumferential medial mass in the sigmoid colon, partially obstructing consistent with carcinoma.   07/25/2014 Initial Biopsy Sigmoid: Biopsy showed invasive adenocarcinoma arising in a background of high-grade dysplasia.   07/28/2014 Imaging CT chest showed no evidence of metastasis    HISTORY OF PRESENTING ILLNESS:  Michelle Erickson 59 y.o. female is here because of abnoaml CT findings which is highly suspecious for metastatic colon cancer.   She presented intermittent nausea, anorexia and abdominal pain since Nov 2015, she has mild pain in the LUQ, crapy pain, positional (bending over),   it happened once every 2-3 weeks, and it has been more frequent in the past month. She has normal BM, but did notice the caliber of the stool is smaller than before. She denies any melena or hematochezia. She lost about 13 lbs in the past 6 months.   She presents to local emergency room in November 2015, and May 2016, was felt to be related to gastric virus. Due to the worsening symptoms lately, she went to [facility] ED on 07/13/2014, CT abdomen was obtained which showed  multiple large liver metastasis and probable sigmoid colon mass. She was referred to [place] for further workup. She is scheduled to see GI Dr. Juanda Chance this afternoon.  INTERIM HISTORY Michelle Erickson returns for follow-up and second cycle chemo today. On C1D3 she developed fever and nausea, was admitted to [facility]. She was found to have UTI and treated with antibiotics. Her symptoms quickly improved, she was discharged home on July 26. Her liver biopsy was canceled due to the hospitalization and infection. She has been doing well since her hospital discharge, has good appetite and eating well, no pain, nausea, change of her bowel habits, or other new symptoms. She is accompanied by her husband to the clinic today  MEDICAL HISTORY:  Past Medical History  Diagnosis Date  . Hyperlipemia   . Diabetes mellitus without complication   . Hypertension   . Metastatic colon cancer to liver 07/21/2014  . Wears glasses     contacts/glasses  . Snoring   . Back pain   . Family history of colon cancer     SURGICAL HISTORY: Past Surgical History  Procedure Laterality Date  . Mouth surgery    . Cataract extraction Bilateral   . Portacath placement N/A 08/03/2014    Procedure: INSERTION PORT-A-CATH;  Surgeon: Almond Lint, MD;  Location: [facility] OR;  Service: General;  Laterality: N/A;    SOCIAL HISTORY: History   Social History  . Marital Status: Married    Spouse Name: Bethann Berkshire  . Number of Children: 2  . Years of Education: N/A   Occupational History  .  Not on file.   Social History Main Topics  . Smoking status: Former Smoker -- 1.00 packs/day for 6 years    Types: Cigarettes    Quit date: 01/21/1976  . Smokeless tobacco: Not on file  . Alcohol Use: No  . Drug Use: No  . Sexual Activity: Yes    Birth Control/ Protection: Post-menopausal     Comment: # 2 pregnancies and #2 live births   Other Topics Concern  . Not on file   Social History Narrative   Married, husband [name]   Has #2 adult  children   Previously worked at [employer] HISTORY: Family History  Problem Relation Age of Onset  . COPD Father   . Heart attack Father   . Arthritis Sister   . Diabetes Sister   . Colon cancer Sister     dx over 16  . Colon polyps Sister   . Seizures Brother   . Colon cancer Sister     dx in her early 81s, died in her early to mid 15s  . Diabetes Mother     ALLERGIES:  has No Known Allergies.  MEDICATIONS:  Current Outpatient Prescriptions  Medication Sig Dispense Refill  . amLODipine (NORVASC) 5 MG tablet Take 5 mg by mouth daily.    Marland Kitchen atorvastatin (LIPITOR) 40 MG tablet Take 40 mg by mouth daily.    . ferrous sulfate 325 (65 FE) MG tablet Take 325 mg by mouth daily with breakfast.    . insulin glargine (LANTUS) 100 UNIT/ML injection Inject 23 Units into the skin at bedtime.     . insulin lispro (HUMALOG) 100 UNIT/ML injection Inject 4-8 Units into the skin 2 (two) times daily as needed for high blood sugar. Sliding scale    . lidocaine-prilocaine (EMLA) cream Apply to affected area once 30 g 3  . LORazepam (ATIVAN) 0.5 MG tablet Take 1 tablet (0.5 mg total) by mouth every 6 (six) hours as needed for anxiety. 30 tablet 0  . losartan (COZAAR) 50 MG tablet Take 50 mg by mouth.    . ondansetron (ZOFRAN-ODT) 8 MG disintegrating tablet Take 8 mg by mouth every 8 (eight) hours as needed for nausea or vomiting.    Marland Kitchen oxyCODONE-acetaminophen (ROXICET) 5-325 MG per tablet Take 1-2 tablets by mouth every 4 (four) hours as needed for severe pain. 30 tablet 0  . PRESCRIPTION MEDICATION Chemo     No current facility-administered medications for this visit.    REVIEW OF SYSTEMS:   Constitutional: Denies fevers, chills or abnormal night sweats Eyes: Denies blurriness of vision, double vision or watery eyes Ears, nose, mouth, throat, and face: Denies mucositis or sore throat Respiratory: Denies cough, dyspnea or wheezes Cardiovascular: Denies palpitation, chest  discomfort or lower extremity swelling Gastrointestinal:  Denies nausea, heartburn or change in bowel habits Skin: Denies abnormal skin rashes Lymphatics: Denies new lymphadenopathy or easy bruising Neurological:Denies numbness, tingling or new weaknesses Behavioral/Psych: Mood is stable, no new changes  All other systems were reviewed with the patient and are negative.  PHYSICAL EXAMINATION: ECOG PERFORMANCE STATUS: 1 - Symptomatic but completely ambulatory  Filed Vitals:   08/23/14 1226  BP: 132/72  Pulse: 90  Temp: 98.4 F (36.9 C)  Resp: 18   Filed Weights   08/23/14 1226  Weight: 151 lb 14.4 oz (68.901 kg)    GENERAL:alert, no distress and comfortable SKIN: skin color, texture, turgor are normal, no rashes or significant lesions EYES: normal,  conjunctiva are pink and non-injected, sclera clear OROPHARYNX:no exudate, no erythema and lips, buccal mucosa, and tongue normal  NECK: supple, thyroid normal size, non-tender, without nodularity LYMPH:  no palpable lymphadenopathy in the cervical, axillary or inguinal LUNGS: clear to auscultation and percussion with normal breathing effort HEART: regular rate & rhythm and no murmurs and no lower extremity edema ABDOMEN:abdomen soft, non-tender and normal bowel sounds Musculoskeletal:no cyanosis of digits and no clubbing  PSYCH: alert & oriented x 3 with fluent speech NEURO: no focal motor/sensory deficits  LABORATORY DATA:  I have reviewed the data as listed CBC Latest Ref Rng 08/23/2014 08/14/2014 08/13/2014  WBC 3.9 - 10.3 10e3/uL 3.5(L) 7.6 7.1  Hemoglobin 11.6 - 15.9 g/dL 9.1(L) 8.5(L) 8.3(L)  Hematocrit 34.8 - 46.6 % 28.4(L) 27.3(L) 26.6(L)  Platelets 145 - 400 10e3/uL 223 176 166     Recent Labs  07/13/14 2051  08/11/14 2047 08/13/14 0558 08/14/14 0435 08/23/14 1214  NA  --   < > 133* 138 135 138  K  --   < > 4.3 4.0 4.0 4.8  CL  --   < > 98* 109 105  --   CO2  --   < > $R'26 26 25 27  'oW$ GLUCOSE  --   < > 97 110*  138* 142*  BUN  --   < > 22* 18 18 11.6  CREATININE  --   < > 0.98 1.04* 0.94 1.0  CALCIUM  --   < > 8.8* 8.2* 8.3* 8.9  GFRNONAA  --   < > >60 57* >60  --   GFRAA  --   < > >60 >60 >60  --   PROT 7.6  < > 7.6 5.9* 6.5 6.8  ALBUMIN 3.8  < > 3.7 2.7* 2.9* 3.4*  AST 65*  < > 70* 31 30 37*  ALT 54  < > 51 $Re'28 28 29  'EPr$ ALKPHOS 146*  < > 178* 134* 146* 190*  BILITOT 0.9  < > 0.7 1.0 0.7 0.26  BILIDIR 0.2  --   --   --  0.1  --   IBILI 0.7  --   --   --  0.6  --   < > = values in this interval not displayed. CEA  Status: Finalresult Visible to patient:  Not Released Nextappt: 08/09/2014 at 10:00 AM in Oncology Florene Glen, Emi [place], [place]) Dx:  Metastatic colon cancer to liver            Ref Range 2wk ago    CEA 0.0 - 5.0 ng/mL 594.5 (H)         PATHOLOGY REPORTS: Diagnosis 07/25/2014  Surgical [P], sigmoid - INVASIVE ADENOCARCINOMA ARISING IN A BACKGROUND OF HIGH GRADE DYSPLASIA. - SEE COMMENT. Microscopic Comment Results are phoned to Dr. Olevia Perches. Internal departmental review obtained (Dr. Donato Heinz) with agreement.  RADIOGRAPHIC STUDIES: I have personally reviewed the radiological images as listed and agreed with the findings in the report.  Ct Abdomen Pelvis W Contrast 07/14/2014   IMPRESSION: 6 cm segment of sigmoid bowel wall thickening concerning for neoplasm with adjacent 14 x 12 mm nodule which may reflect direct tumoral extension or nodal metastasis. Diffuse hepatic metastasis. No bowel obstruction. Recommend follow-up sigmoidoscopy/colonoscopy on a nonemergent basis.  Mild bladder wall thickening could reflect underdistention or cystitis.  Acute findings discussed with and reconfirmed by Dr.NATHAN PICKERING on 07/14/2014 at 1:36 am.   Electronically Signed   By: Elon Alas M.D.   On: 07/14/2014  01:36   CT chest with contrast 07/28/2014 IMPRESSION: 1. No evidence for metastatic disease in the chest. 2. Bulky metastatic disease in the liver as seen on the  previous abdomen and pelvis CT scan. Visualized hepatic lesions show no substantial interval change.  Colonoscopy 07/25/2014 Dr. Olevia Perches  ENDOSCOPIC IMPRESSION: Near circumferential medium mass was found in the sigmoid colon; multiple biopsies were performed using cold forceps, partially obstructing mass consistent with carcinoma. Placement of endoclips and tattoo at the margins of the mass which extends from 18-25 cm from the anus  ASSESSMENT & PLAN: 59 year old African-American female, presented with intermittent nausea vomiting and anemia.   1. Sigmoid colon cancer with liver metastasis, TxNxM1, stage IV -I reviewed her CT scans, colonoscopy, and colon mass biopsy findings with patient and her husband in great details, images were reviewed in pe -Her liver masses on the CT scan are highly suspicious for metastatic disease, her CEA level is very high, which supports metastatic colon cancer. -I recommend liver biopsy to confirm the metastasis and establish a diagnosis. Benefits and risks were discussed with patient, she agrees. She is scheduled for this Friday -The natural history of metastatic colon cancer and treatment options were discussed with patient and her husband. Giving the diffuse metastasis in the liver, this is unfortunately incurable or disease. I recommend systemic chemotherapy to control her metastatic disease, the goal of therapy is disease control and prolong her life. -The option of first-line chemotherapy were discussed with her, I recommend FOLFOX and Avastin every 2 weeks intravenously, and I will continue as long as she tolerates and is working -Unfortunately, we do not have more tissue left for MSI and RAS mutation test, I recommend her to have a liver biopsy to get more tissue and also confirmed the metastasis. She agreed. -If her tumor is negative for extended RAS mutation, I would strongly favor EGFR inhibitor in addition to her chemotherapy.  She has metastatic left-sided  colon cancer, which usually response well to EGFR inhibitor, which likely will prolong her PFS and her survival   -Giving the upcoming liver biopsy, I'll hold Avastin for now -Lab reviewed, adequate for treatment, we'll proceed with cycle 2 today   2. Anemia -Likely secondary to tumor bleeding and iron deficiency -Her ferritin is normal, serum iron and saturation slightly low, TIBC low normal, I suggest her to take oral iron supplement, she tolerates well, we'll continue once daily, with orange juice or vitamin C   3. DM, HTN -Continue follow-up with PCP - we discussed the potential impact of chemotherapy and premedication dexamethasone on her blood pressure and sugar,will  Monitor closely.  4. Depression -She appears depressed to me and we discussed the option of antidepressant medication. She declines at this point  PLan -cycle 2 FOLFOX today -hold on Avastin due to pending liver biopsy in 2 weeks RTC in 2 weeks for cycle 3   All questions were answered. The patient knows to call the clinic with any problems, questions or concerns. I spent 20 minutes counseling the patient face to face. The total time spent in the appointment was 30 minutes and more than 50% was on counseling.     Truitt Merle, MD 08/23/2014 12:50 PM

## 2014-08-23 NOTE — Telephone Encounter (Signed)
Pt confirmed labs/ov per 08/03 POF, gave pt avs and calendar.Cherylann Banas, sent msg to add chemo and also s/w MD for her apt on 08/18 approved from MD

## 2014-08-23 NOTE — Telephone Encounter (Signed)
Per staff message and POF I have scheduled appts. Advised scheduler of appts. JMW  

## 2014-08-23 NOTE — Patient Instructions (Signed)
[facility] Discharge Instructions for Patients Receiving Chemotherapy  Today you received the following chemotherapy agents oxaliplatin/leucovorin/fluorouracil  To help prevent nausea and vomiting after your treatment, we encourage you to take your nausea medication as directed If you develop nausea and vomiting that is not controlled by your nausea medication, call the clinic.   BELOW ARE SYMPTOMS THAT SHOULD BE REPORTED IMMEDIATELY:  *FEVER GREATER THAN 100.5 F  *CHILLS WITH OR WITHOUT FEVER  NAUSEA AND VOMITING THAT IS NOT CONTROLLED WITH YOUR NAUSEA MEDICATION  *UNUSUAL SHORTNESS OF BREATH  *UNUSUAL BRUISING OR BLEEDING  TENDERNESS IN MOUTH AND THROAT WITH OR WITHOUT PRESENCE OF ULCERS  *URINARY PROBLEMS  *BOWEL PROBLEMS  UNUSUAL RASH Items with * indicate a potential emergency and should be followed up as soon as possible.  Feel free to call the clinic you have any questions or concerns. The clinic phone number is [phone].  

## 2014-08-24 LAB — CEA: CEA: 606.7 ng/mL — AB (ref 0.0–5.0)

## 2014-08-25 ENCOUNTER — Ambulatory Visit (HOSPITAL_BASED_OUTPATIENT_CLINIC_OR_DEPARTMENT_OTHER): Payer: BLUE CROSS/BLUE SHIELD

## 2014-08-25 VITALS — BP 116/72 | HR 91 | Temp 98.7°F | Resp 20

## 2014-08-25 DIAGNOSIS — C187 Malignant neoplasm of sigmoid colon: Secondary | ICD-10-CM | POA: Diagnosis not present

## 2014-08-25 DIAGNOSIS — Z452 Encounter for adjustment and management of vascular access device: Secondary | ICD-10-CM

## 2014-08-25 DIAGNOSIS — C189 Malignant neoplasm of colon, unspecified: Secondary | ICD-10-CM

## 2014-08-25 MED ORDER — SODIUM CHLORIDE 0.9 % IJ SOLN
10.0000 mL | INTRAMUSCULAR | Status: DC | PRN
  Administered 2014-08-25: 10 mL via INTRAVENOUS
  Filled 2014-08-25: qty 10

## 2014-08-25 MED ORDER — HEPARIN SOD (PORK) LOCK FLUSH 100 UNIT/ML IV SOLN
500.0000 [IU] | Freq: Once | INTRAVENOUS | Status: AC
  Administered 2014-08-25: 500 [IU] via INTRAVENOUS
  Filled 2014-08-25: qty 5

## 2014-08-29 ENCOUNTER — Telehealth: Payer: Self-pay | Admitting: *Deleted

## 2014-08-29 NOTE — Telephone Encounter (Signed)
Confirmed with Dr. Burr Medico that liver biopsy still needed for tissue testing before 08/21/14. Spoke with Elmo Putt in central scheduling and she will get patient called.

## 2014-08-30 ENCOUNTER — Telehealth: Payer: Self-pay | Admitting: *Deleted

## 2014-08-30 NOTE — Telephone Encounter (Signed)
Returned call to patient and informed her that her schedule for liver biopsy and chemo was fine. Reviewed schedule with her. She verbalized understanding,.

## 2014-08-30 NOTE — Telephone Encounter (Signed)
Please let her know that the biopsy schedule look fine, it's not too close to her chemo.   Truitt Merle

## 2014-08-30 NOTE — Telephone Encounter (Signed)
VM message received @ 10:00 am regarding liver biopsy date and time and her next scheduled chemo. Her liver biopsy is scheduled for 09/05/14 and her next chemo is scheduled for 09/07/14. She is wondering if the chemo is too close to the time of the biopsy.  Please advise.

## 2014-08-31 ENCOUNTER — Other Ambulatory Visit: Payer: BLUE CROSS/BLUE SHIELD | Admitting: Internal Medicine

## 2014-08-31 ENCOUNTER — Other Ambulatory Visit: Payer: Self-pay | Admitting: *Deleted

## 2014-08-31 ENCOUNTER — Ambulatory Visit: Payer: BLUE CROSS/BLUE SHIELD | Admitting: Nurse Practitioner

## 2014-08-31 ENCOUNTER — Telehealth: Payer: Self-pay | Admitting: Genetic Counselor

## 2014-08-31 ENCOUNTER — Other Ambulatory Visit: Payer: BLUE CROSS/BLUE SHIELD

## 2014-08-31 ENCOUNTER — Ambulatory Visit: Payer: Self-pay | Admitting: Genetic Counselor

## 2014-08-31 DIAGNOSIS — Z800 Family history of malignant neoplasm of digestive organs: Secondary | ICD-10-CM

## 2014-08-31 DIAGNOSIS — C189 Malignant neoplasm of colon, unspecified: Secondary | ICD-10-CM

## 2014-08-31 DIAGNOSIS — Z1379 Encounter for other screening for genetic and chromosomal anomalies: Secondary | ICD-10-CM | POA: Insufficient documentation

## 2014-08-31 DIAGNOSIS — C787 Secondary malignant neoplasm of liver and intrahepatic bile duct: Secondary | ICD-10-CM

## 2014-08-31 NOTE — Progress Notes (Signed)
HPI: [name] was previously seen in the [facility] due to a personal and family history of colon cancer and concerns regarding a hereditary predisposition to cancer. Please refer to our prior cancer genetics clinic note for more information regarding [name]'s medical, social and family histories, and our assessment and recommendations, at the time. [name] recent genetic test results were disclosed to her, as were recommendations warranted by these results. These results and recommendations are discussed in more detail below.  GENETIC TEST RESULTS: At the time of [name]'s visit, we recommended she pursue genetic testing of the Colorectal cancer gene panel. The Colorectal Cancer Panel offered by [facility] includes sequencing and/or duplication/deletion testing of the following 19 genes: Michelle Erickson, ATM, AXIN2, BMPR1A, CDH1, CHEK2, EPCAM, MLH1, MSH2, MSH6, MUTYH, PMS2, POLD1, POLE, PTEN, SCG5/GREM1, SMAD4, STK11, and TP53.  The report date is August 30, 2014.  Genetic testing was normal, and did not reveal a deleterious mutation in these genes. The test report has been scanned into EPIC and is located under the Media tab.   We discussed with [name] that since the current genetic testing is not perfect, it is possible there may be a gene mutation in one of these genes that current testing cannot detect, but that chance is small. We also discussed, that it is possible that another gene that has not yet been discovered, or that we have not yet tested, is responsible for the cancer diagnoses in the family, and it is, therefore, important to remain in touch with cancer genetics in the future so that we can continue to offer [name] the most up to date genetic testing.   Genetic testing did detect a Variant of Unknown Significance in the Michelle Erickson gene called [id number]. At this time, it is unknown if this variant is associated with increased cancer risk or if this is a normal finding, but  most variants such as this get reclassified to being inconsequential. When looking in the Michelle Erickson, there are conflicting interpretations of this variant, with two labs calling it likely benign and two labs calling this a variant of uncertain significance.  Although this could change, it appears that this variant may be heading in the direction of being reclassified as benign. This is not a clinically actionable result. It should not be used to make medical management decisions. With time, we suspect the lab will determine the significance of this variant, if any. If we do learn more about it, we will try to contact Michelle Erickson to discuss it further. However, it is important to stay in touch with [place] periodically and keep the address and phone number up to date.  CANCER SCREENING RECOMMENDATIONS: Given [name]'s personal and family histories, we must interpret these negative results with some caution.  Families with features suggestive of hereditary risk for cancer tend to have multiple family members with cancer, diagnoses in multiple generations and diagnoses before the age of 80. [name] family exhibits some, but not all, of these features. Thus this result may simply reflect our current inability to detect all mutations within these genes or there may be a different gene that has not yet been discovered or tested.   RECOMMENDATIONS FOR FAMILY MEMBERS: Women in this family might be at some increased risk of developing cancer, over the general population risk, simply due to the family history of cancer. We recommended women in this family have a yearly mammogram beginning at age 67, or 59 years  younger than the earliest onset of cancer, an an annual clinical breast exam, and perform monthly breast self-exams. Women in this family should also have a gynecological exam as recommended by their primary provider. All family members should have a colonoscopy by age 34.  FOLLOW-UP: Lastly, we discussed  with [name] that cancer genetics is a rapidly advancing field and it is possible that new genetic tests will be appropriate for her and/or her family members in the future. We encouraged her to remain in contact with cancer genetics on an annual basis so we can update her personal and family histories and let her know of advances in cancer genetics that may benefit this family.   Our contact number was provided. [name] questions were answered to her satisfaction, and she knows she is welcome to call us at anytime with additional questions or concerns.   Roma Kayser, MS, [facility] Certified Genetic Counselor [email or web address]

## 2014-08-31 NOTE — Telephone Encounter (Signed)
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  Discussed that this VUS has been classified as likely benign by other labs.  Most VUS are found to be inconsequential, and it appears that this VUS may be heading in that direction.

## 2014-09-04 ENCOUNTER — Other Ambulatory Visit: Payer: Self-pay | Admitting: Radiology

## 2014-09-05 ENCOUNTER — Encounter (HOSPITAL_COMMUNITY): Payer: Self-pay

## 2014-09-05 ENCOUNTER — Ambulatory Visit (HOSPITAL_COMMUNITY)
Admission: RE | Admit: 2014-09-05 | Discharge: 2014-09-05 | Disposition: A | Discharge status: Home / Self Care | Payer: BLUE CROSS/BLUE SHIELD | Source: Ambulatory Visit | Attending: Hematology | Admitting: Hematology

## 2014-09-05 DIAGNOSIS — Z79899 Other long term (current) drug therapy: Secondary | ICD-10-CM | POA: Diagnosis not present

## 2014-09-05 DIAGNOSIS — C189 Malignant neoplasm of colon, unspecified: Secondary | ICD-10-CM

## 2014-09-05 DIAGNOSIS — K769 Liver disease, unspecified: Secondary | ICD-10-CM | POA: Diagnosis present

## 2014-09-05 DIAGNOSIS — I10 Essential (primary) hypertension: Secondary | ICD-10-CM | POA: Diagnosis not present

## 2014-09-05 DIAGNOSIS — D649 Anemia, unspecified: Secondary | ICD-10-CM | POA: Insufficient documentation

## 2014-09-05 DIAGNOSIS — C787 Secondary malignant neoplasm of liver and intrahepatic bile duct: Secondary | ICD-10-CM | POA: Diagnosis not present

## 2014-09-05 DIAGNOSIS — E119 Type 2 diabetes mellitus without complications: Secondary | ICD-10-CM | POA: Insufficient documentation

## 2014-09-05 DIAGNOSIS — C187 Malignant neoplasm of sigmoid colon: Secondary | ICD-10-CM | POA: Diagnosis not present

## 2014-09-05 DIAGNOSIS — Z794 Long term (current) use of insulin: Secondary | ICD-10-CM | POA: Insufficient documentation

## 2014-09-05 LAB — CBC
HEMATOCRIT: 30.5 % — AB (ref 36.0–46.0)
HEMOGLOBIN: 9.5 g/dL — AB (ref 12.0–15.0)
MCH: 25.3 pg — ABNORMAL LOW (ref 26.0–34.0)
MCHC: 31.1 g/dL (ref 30.0–36.0)
MCV: 81.1 fL (ref 78.0–100.0)
Platelets: 162 10*3/uL (ref 150–400)
RBC: 3.76 MIL/uL — AB (ref 3.87–5.11)
RDW: 16.2 % — ABNORMAL HIGH (ref 11.5–15.5)
WBC: 3.8 10*3/uL — AB (ref 4.0–10.5)

## 2014-09-05 LAB — APTT: APTT: 36 s (ref 24–37)

## 2014-09-05 LAB — GLUCOSE, CAPILLARY: GLUCOSE-CAPILLARY: 119 mg/dL — AB (ref 65–99)

## 2014-09-05 LAB — PROTIME-INR
INR: 1.08 (ref 0.00–1.49)
Prothrombin Time: 14.2 seconds (ref 11.6–15.2)

## 2014-09-05 MED ORDER — SODIUM CHLORIDE 0.9 % IV SOLN: INTRAVENOUS | Status: DC

## 2014-09-05 MED ORDER — FENTANYL CITRATE (PF) 100 MCG/2ML IJ SOLN
INTRAMUSCULAR | Status: AC
  Filled 2014-09-05: qty 2

## 2014-09-05 MED ORDER — MIDAZOLAM HCL 2 MG/2ML IJ SOLN
INTRAMUSCULAR | Status: AC | PRN
  Administered 2014-09-05: 1.5 mg via INTRAVENOUS

## 2014-09-05 MED ORDER — MIDAZOLAM HCL 2 MG/2ML IJ SOLN
INTRAMUSCULAR | Status: AC
  Filled 2014-09-05: qty 2

## 2014-09-05 MED ORDER — GELATIN ABSORBABLE 12-7 MM EX MISC
CUTANEOUS | Status: AC
  Filled 2014-09-05: qty 1

## 2014-09-05 MED ORDER — LIDOCAINE HCL (PF) 1 % IJ SOLN
INTRAMUSCULAR | Status: AC
  Filled 2014-09-05: qty 10

## 2014-09-05 MED ORDER — FENTANYL CITRATE (PF) 100 MCG/2ML IJ SOLN
INTRAMUSCULAR | Status: AC | PRN
  Administered 2014-09-05: 75 ug via INTRAVENOUS

## 2014-09-05 NOTE — H&P (Signed)
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 She has been seen by Dr. Burr Medico on 08/23/14 and is scheduled today for liver lesion biopsy.   The patient has had a H&P performed within the last 30 days, all history, medications, and exam have been reviewed. The patient denies any interval changes since the H&P.  Medications: Prior to Admission medications   Medication Sig Start Date End Date Taking? Authorizing Provider  amLODipine (NORVASC) 5 MG tablet Take 5 mg by mouth daily. 07/17/14  Yes Historical Provider, MD  atorvastatin (LIPITOR) 40 MG tablet Take 40 mg by mouth daily. 07/04/14 07/04/15 Yes Historical Provider, MD  ferrous sulfate 325 (65 FE) MG tablet Take 325 mg by mouth daily with breakfast.   Yes Historical Provider, MD  Insulin Glargine (LANTUS SOLOSTAR) 100 UNIT/ML Solostar Pen Inject 23 Units into the skin daily at 10 pm.   Yes Historical Provider, MD  insulin lispro (HUMALOG) 100 UNIT/ML injection Inject 4-8 Units into the skin 2 (two) times daily as needed for high blood sugar. Sliding scale   Yes Historical Provider, MD  lidocaine-prilocaine (EMLA) cream  08/08/14  Yes Historical Provider, MD  LORazepam (ATIVAN) 0.5 MG tablet Take 1 tablet (0.5 mg total) by mouth every 6 (six) hours as needed for anxiety. 08/11/14  Yes Susanne Borders, NP  losartan (COZAAR) 50 MG tablet Take 50 mg by mouth. 01/25/14 01/25/15 Yes Historical Provider, MD  ondansetron (ZOFRAN-ODT) 8 MG disintegrating tablet Take 8 mg by mouth every 8 (eight) hours as needed for nausea or vomiting.   Yes Historical Provider, MD  oxyCODONE-acetaminophen (ROXICET) 5-325 MG per tablet Take 1-2 tablets by mouth every 4 (four) hours as needed for severe pain. Patient not taking: Reported on 08/31/2014 08/03/14   Stark Klein, MD    Vital Signs: BP 139/78 mmHg  Pulse 77  Temp(Src) 97.6 F (36.4 C) (Oral)  Resp 18  Ht 5\' 4"  (1.626 m)  Wt 151 lb (68.493 kg)  BMI 25.91 kg/m2  SpO2  100%  Physical Exam  Constitutional: She is oriented to person, place, and time. No distress.  HENT:  Head: Normocephalic and atraumatic.  Neck: No tracheal deviation present.  Cardiovascular: Normal rate and regular rhythm.  Exam reveals no gallop and no friction rub.   No murmur heard. Pulmonary/Chest: Effort normal and breath sounds normal. No respiratory distress. She has no wheezes. She has no rales.  Abdominal: Soft. Bowel sounds are normal. She exhibits no distension. There is no tenderness.  Neurological: She is alert and oriented to person, place, and time.  Skin: She is not diaphoretic.    Mallampati Score:  MD Evaluation Airway: WNL Heart: WNL Abdomen: WNL Chest/ Lungs: WNL ASA  Classification: 3 Mallampati/Airway Score: Two  Labs:  CBC:  Recent Labs  08/11/14 2047 08/13/14 0558 08/14/14 0435 08/23/14 1214  WBC 12.6* 7.1 7.6 3.5*  HGB 10.4* 8.3* 8.5* 9.1*  HCT 32.3* 26.6* 27.3* 28.4*  PLT 222 166 176 223    COAGS:  Recent Labs  07/21/14 1049 08/02/14 1348 08/14/14 0435  INR 1.00* 1.00 1.00  APTT  --  36  --     BMP:  Recent Labs  08/02/14 1348  08/11/14 2047 08/13/14 0558 08/14/14 0435 08/23/14 1214  NA 139  < > 133* 138 135 138  K 4.7  < > 4.3 4.0 4.0 4.8  CL 107  --  98* 109 105  --   CO2 25  < >  26 26 25 27   GLUCOSE 147*  < > 97 110* 138* 142*  BUN 13  < > 22* 18 18 11.6  CALCIUM 9.4  < > 8.8* 8.2* 8.3* 8.9  CREATININE 1.08*  < > 0.98 1.04* 0.94 1.0  GFRNONAA 54*  --  >60 57* >60  --   GFRAA >60  --  >60 >60 >60  --   < > = values in this interval not displayed.  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 Risks and Benefits discussed with the patient including, but not limited to bleeding, infection, damage to adjacent structures or low yield requiring additional tests. All of the patient's questions were answered, patient is agreeable to proceed. Consent signed and in chart. DM HTN Anemia   Signed: Hedy Jacob 09/05/2014, 9:16 AM

## 2014-09-05 NOTE — Discharge Instructions (Signed)
Liver Biopsy, Care After °These instructions give you information on caring for yourself after your procedure. Your doctor may also give you more specific instructions. Call your doctor if you have any problems or questions after your procedure. °HOME CARE °· Rest at home for 1-2 days or as told by your doctor. °· Have someone stay with you for at least 24 hours. °· Do not do these things in the first 24 hours: °¨ Drive. °¨ Use machinery. °¨ Take care of other people. °¨ Sign legal documents. °¨ Take a bath or shower. °· There are many different ways to close and cover a cut (incision). For example, a cut can be closed with stitches, skin glue, or adhesive strips. Follow your doctor's instructions on: °¨ Taking care of your cut. °¨ Changing and removing your bandage (dressing). °¨ Removing whatever was used to close your cut. °· Do not drink alcohol in the first week. °· Do not lift more than 5 pounds or play contact sports for the first 2 weeks. °· Take medicines only as told by your doctor. For 1 week, do not take medicine that has aspirin in it or medicines like ibuprofen. °· Get your test results. °GET HELP IF: °· A cut bleeds and leaves more than just a small spot of blood. °· A cut is red, puffs up (swells), or hurts more than before. °· Fluid or something else comes from a cut. °· A cut smells bad. °· You have a fever or chills. °GET HELP RIGHT AWAY IF: °· You have swelling, bloating, or pain in your belly (abdomen). °· You get dizzy or faint. °· You have a rash. °· You feel sick to your stomach (nauseous) or throw up (vomit). °· You have trouble breathing, feel short of breath, or feel faint. °· Your chest hurts. °· You have problems talking or seeing. °· You have trouble balancing or moving your arms or legs. °Document Released: 10/16/2007 Document Revised: 05/23/2013 Document Reviewed: 03/04/2013 °ExitCare® Patient Information ©2015 [employer]. This information is not intended to replace advice given to  you by your health care provider. Make sure you discuss any questions you have with your health care provider. ° °

## 2014-09-05 NOTE — Procedures (Signed)
R lobe liver lesion Bx 18 g core times three No comp/EBL

## 2014-09-07 ENCOUNTER — Telehealth: Payer: Self-pay | Admitting: Hematology

## 2014-09-07 ENCOUNTER — Ambulatory Visit (HOSPITAL_BASED_OUTPATIENT_CLINIC_OR_DEPARTMENT_OTHER): Payer: BLUE CROSS/BLUE SHIELD | Admitting: Hematology

## 2014-09-07 ENCOUNTER — Ambulatory Visit: Payer: BLUE CROSS/BLUE SHIELD

## 2014-09-07 ENCOUNTER — Other Ambulatory Visit (HOSPITAL_BASED_OUTPATIENT_CLINIC_OR_DEPARTMENT_OTHER): Payer: BLUE CROSS/BLUE SHIELD

## 2014-09-07 ENCOUNTER — Encounter: Payer: Self-pay | Admitting: Hematology

## 2014-09-07 DIAGNOSIS — C187 Malignant neoplasm of sigmoid colon: Secondary | ICD-10-CM

## 2014-09-07 DIAGNOSIS — D649 Anemia, unspecified: Secondary | ICD-10-CM

## 2014-09-07 DIAGNOSIS — C189 Malignant neoplasm of colon, unspecified: Secondary | ICD-10-CM

## 2014-09-07 DIAGNOSIS — E119 Type 2 diabetes mellitus without complications: Secondary | ICD-10-CM

## 2014-09-07 DIAGNOSIS — D509 Iron deficiency anemia, unspecified: Secondary | ICD-10-CM

## 2014-09-07 DIAGNOSIS — C787 Secondary malignant neoplasm of liver and intrahepatic bile duct: Secondary | ICD-10-CM

## 2014-09-07 DIAGNOSIS — I10 Essential (primary) hypertension: Secondary | ICD-10-CM

## 2014-09-07 LAB — COMPREHENSIVE METABOLIC PANEL (CC13)
ALT: 22 U/L (ref 0–55)
ANION GAP: 8 meq/L (ref 3–11)
AST: 23 U/L (ref 5–34)
Albumin: 3.7 g/dL (ref 3.5–5.0)
Alkaline Phosphatase: 122 U/L (ref 40–150)
BILIRUBIN TOTAL: 0.25 mg/dL (ref 0.20–1.20)
BUN: 14.2 mg/dL (ref 7.0–26.0)
CALCIUM: 9.7 mg/dL (ref 8.4–10.4)
CO2: 25 meq/L (ref 22–29)
CREATININE: 1.1 mg/dL (ref 0.6–1.1)
Chloride: 104 mEq/L (ref 98–109)
EGFR: 60 mL/min/{1.73_m2} — ABNORMAL LOW (ref >90)
Glucose: 236 mg/dl — ABNORMAL HIGH (ref 70–140)
Potassium: 4.4 mEq/L (ref 3.5–5.1)
Sodium: 137 mEq/L (ref 136–145)
TOTAL PROTEIN: 7 g/dL (ref 6.4–8.3)

## 2014-09-07 LAB — CBC WITH DIFFERENTIAL/PLATELET
BASO%: 1 % (ref 0.0–2.0)
Basophils Absolute: 0 10*3/uL (ref 0.0–0.1)
EOS%: 4.8 % (ref 0.0–7.0)
Eosinophils Absolute: 0.1 10*3/uL (ref 0.0–0.5)
HEMATOCRIT: 29.7 % — AB (ref 34.8–46.6)
HGB: 9.4 g/dL — ABNORMAL LOW (ref 11.6–15.9)
LYMPH#: 0.8 10*3/uL — AB (ref 0.9–3.3)
LYMPH%: 42.5 % (ref 14.0–49.7)
MCH: 25.9 pg (ref 25.1–34.0)
MCHC: 31.7 g/dL (ref 31.5–36.0)
MCV: 81.8 fL (ref 79.5–101.0)
MONO#: 0.3 10*3/uL (ref 0.1–0.9)
MONO%: 18.8 % — ABNORMAL HIGH (ref 0.0–14.0)
NEUT%: 32.9 % — ABNORMAL LOW (ref 38.4–76.8)
NEUTROS ABS: 0.6 10*3/uL — AB (ref 1.5–6.5)
PLATELETS: 134 10*3/uL — AB (ref 145–400)
RBC: 3.63 10*6/uL — ABNORMAL LOW (ref 3.70–5.45)
RDW: 16.4 % — AB (ref 11.2–14.5)
WBC: 1.8 10*3/uL — AB (ref 3.9–10.3)

## 2014-09-07 LAB — CEA: CEA: 555.1 ng/mL — AB (ref 0.0–5.0)

## 2014-09-07 NOTE — Telephone Encounter (Signed)
Gave patient avs report and appoitnments for August and September. Per 8/18 pof adjust lab/fu/tx today and 9/1 to 1 week later. Also per pof f/u in 2 weeks.

## 2014-09-07 NOTE — Progress Notes (Signed)
[place]  [phone] [phone]) [place] Note   Patient Care Team: Dione Housekeeper, MD as PCP - General (Family Medicine) Tania Ade, RN as Registered Nurse (Oncology) 09/07/2014  CHIEF COMPLAINTS:  Follow-up of metastatic colon cancer  Oncology History   Metastatic colon cancer to liver   Staging form: Colon and Rectum, AJCC 7th Edition     Clinical: Stage Unknown ([place], NX, M1) - Unsigned        Metastatic colon cancer to liver   07/14/2014 Imaging CT abdomen and pelvis with contrast showed a 6 cm segment of sigmoid bowel wall thickening with adjacent 14 x 12 mm nodules, diffuse hepatic metastasis. No bowel obstruction.   07/14/2014 Initial Diagnosis Metastatic colon cancer to liver   07/25/2014 Procedure Colonoscopy showed a near circumferential medial mass in the sigmoid colon, partially obstructing consistent with carcinoma.   07/25/2014 Initial Biopsy Sigmoid: Biopsy showed invasive adenocarcinoma arising in a background of high-grade dysplasia.   07/28/2014 Imaging CT chest showed no evidence of metastasis   08/09/2014 -  Chemotherapy FOLFOX every 2 weeks    08/11/2014 - 08/15/2014 Hospital Admission Pt was admitted for fever and nausea, treated for UTI, liver biopsy cancelled due to the infection     HISTORY OF PRESENTING ILLNESS:  Michelle Erickson 59 y.o. female is here because of abnoaml CT findings which is highly suspecious for metastatic colon cancer.   She presented intermittent nausea, anorexia and abdominal pain since Nov 2015, she has mild pain in the LUQ, crapy pain, positional (bending over),   it happened once every 2-3 weeks, and it has been more frequent in the past month. She has normal BM, but did notice the caliber of the stool is smaller than before. She denies any melena or hematochezia. She lost about 13 lbs in the past 6 months.   She presents to local emergency room in  November 2015, and May 2016, was felt to be related to gastric virus. Due to the worsening symptoms lately, she went [facility] ED on 07/13/2014, CT abdomen was obtained which showed multiple large liver metastasis and probable sigmoid colon mass. She was referred to [place] for further workup. She is scheduled to see GI Dr. Olevia Perches this afternoon.  INTERIM HISTORY Evani returns for follow-up. She tolerated the second cycle chemotherapy well, mild nausea, no vomiting, no other complaints. She has been eating well, no fever or chills. She underwent liver biopsy 2 days ago, tolerated the procedure very well. No tenderness or pain at the biopsy site.  MEDICAL HISTORY:  Past Medical History  Diagnosis Date  . Hyperlipemia   . Diabetes mellitus without complication   . Hypertension   . Metastatic colon cancer to liver 07/21/2014  . Wears glasses     contacts/glasses  . Snoring   . Back pain   . Family history of colon cancer     SURGICAL HISTORY: Past Surgical History  Procedure Laterality Date  . Mouth surgery    . Cataract extraction Bilateral   . Portacath placement N/A 08/03/2014    Procedure: INSERTION PORT-A-CATH;  Surgeon: Stark Klein, MD;  Location: [place];  Service: General;  Laterality: N/A;    SOCIAL HISTORY: Social History   Social History  . Marital Status: Married    Spouse Name: Charlotte Crumb  . Number of Children: 2  . Years of Education: N/A   Occupational History  . Not on file.   Social History  Main Topics  . Smoking status: Former Smoker -- 1.00 packs/day for 6 years    Types: Cigarettes    Quit date: 01/21/1976  . Smokeless tobacco: Not on file  . Alcohol Use: No  . Drug Use: No  . Sexual Activity: Yes    Birth Control/ Protection: Post-menopausal     Comment: # 2 pregnancies and #2 live births   Other Topics Concern  . Not on file   Social History Narrative   Married, husband [name]   Has #2 adult children   Previously worked at [employer] HISTORY: Family History  Problem Relation Age of Onset  . COPD Father   . Heart attack Father   . Arthritis Sister   . Diabetes Sister   . Colon cancer Sister     dx over 68  . Colon polyps Sister   . Seizures Brother   . Colon cancer Sister     dx in her early 1s, died in her early to mid 75s  . Diabetes Mother     ALLERGIES:  has No Known Allergies.  MEDICATIONS:  Current Outpatient Prescriptions  Medication Sig Dispense Refill  . amLODipine (NORVASC) 5 MG tablet Take 5 mg by mouth daily.    Marland Kitchen atorvastatin (LIPITOR) 40 MG tablet Take 40 mg by mouth daily.    . ferrous sulfate 325 (65 FE) MG tablet Take 325 mg by mouth daily with breakfast.    . Insulin Glargine (LANTUS SOLOSTAR) 100 UNIT/ML Solostar Pen Inject 23 Units into the skin daily at 10 pm.    . insulin lispro (HUMALOG) 100 UNIT/ML injection Inject 4-8 Units into the skin 2 (two) times daily as needed for high blood sugar. Sliding scale    . lidocaine-prilocaine (EMLA) cream     . LORazepam (ATIVAN) 0.5 MG tablet Take 1 tablet (0.5 mg total) by mouth every 6 (six) hours as needed for anxiety. 30 tablet 0  . losartan (COZAAR) 50 MG tablet Take 50 mg by mouth.    . ondansetron (ZOFRAN-ODT) 8 MG disintegrating tablet Take 8 mg by mouth every 8 (eight) hours as needed for nausea or vomiting.    Marland Kitchen oxyCODONE-acetaminophen (ROXICET) 5-325 MG per tablet Take 1-2 tablets by mouth every 4 (four) hours as needed for severe pain. (Patient not taking: Reported on 08/31/2014) 30 tablet 0   No current facility-administered medications for this visit.    REVIEW OF SYSTEMS:   Constitutional: Denies fevers, chills or abnormal night sweats Eyes: Denies blurriness of vision, double vision or watery eyes Ears, nose, mouth, throat, and face: Denies mucositis or sore throat Respiratory: Denies cough, dyspnea or wheezes Cardiovascular: Denies palpitation, chest discomfort or lower extremity swelling Gastrointestinal:  Denies  nausea, heartburn or change in bowel habits Skin: Denies abnormal skin rashes Lymphatics: Denies new lymphadenopathy or easy bruising Neurological:Denies numbness, tingling or new weaknesses Behavioral/Psych: Mood is stable, no new changes  All other systems were reviewed with the patient and are negative.  PHYSICAL EXAMINATION: ECOG PERFORMANCE STATUS: 1 - Symptomatic but completely ambulatory  Filed Vitals:   09/07/14 0932  BP: 144/91  Pulse: 91  Temp: 98.4 F (36.9 C)  Resp: 18   Filed Weights   09/07/14 0932  Weight: 151 lb 14.4 oz (68.901 kg)    GENERAL:alert, no distress and comfortable SKIN: skin color, texture, turgor are normal, no rashes or significant lesions EYES: normal, conjunctiva are pink and non-injected, sclera clear OROPHARYNX:no exudate, no  erythema and lips, buccal mucosa, and tongue normal  NECK: supple, thyroid normal size, non-tender, without nodularity LYMPH:  no palpable lymphadenopathy in the cervical, axillary or inguinal LUNGS: clear to auscultation and percussion with normal breathing effort HEART: regular rate & rhythm and no murmurs and no lower extremity edema ABDOMEN:abdomen soft, non-tender and normal bowel sounds Musculoskeletal:no cyanosis of digits and no clubbing  PSYCH: alert & oriented x 3 with fluent speech NEURO: no focal motor/sensory deficits  LABORATORY DATA:  I have reviewed the data as listed CBC Latest Ref Rng 09/07/2014 09/05/2014 08/23/2014  WBC 3.9 - 10.3 10e3/uL 1.8(L) 3.8(L) 3.5(L)  Hemoglobin 11.6 - 15.9 g/dL 9.4(L) 9.5(L) 9.1(L)  Hematocrit 34.8 - 46.6 % 29.7(L) 30.5(L) 28.4(L)  Platelets 145 - 400 10e3/uL 134(L) 162 223     Recent Labs  07/13/14 2051  08/11/14 2047 08/13/14 0558 08/14/14 0435 08/23/14 1214 09/07/14 0855  NA  --   < > 133* 138 135 138 137  K  --   < > 4.3 4.0 4.0 4.8 4.4  CL  --   < > 98* 109 105  --   --   CO2  --   < > _0 GLUCOSE  --   < > 97 110* 138* 142* 236*  BUN  --   <  > 22* 18 18 11.6 14.2  CREATININE  --   < > 0.98 1.04* 0.94 1.0 1.1  CALCIUM  --   < > 8.8* 8.2* 8.3* 8.9 9.7  GFRNONAA  --   < > >60 57* >60  --   --   GFRAA  --   < > >60 >60 >60  --   --   PROT 7.6  < > 7.6 5.9* 6.5 6.8 7.0  ALBUMIN 3.8  < > 3.7 2.7* 2.9* 3.4* 3.7  AST 65*  < > 70* 31 30 37* 23  ALT 54  < > 51 _1 ALKPHOS 146*  < > 178* 134* 146* 190* 122  BILITOT 0.9  < > 0.7 1.0 0.7 0.26 0.25  BILIDIR 0.2  --   --   --  0.1  --   --   IBILI 0.7  --   --   --  0.6  --   --   < > = values in this interval not displayed.  PATHOLOGY REPORTS: Diagnosis 07/25/2014  Surgical [P], sigmoid - INVASIVE ADENOCARCINOMA ARISING IN A BACKGROUND OF HIGH GRADE DYSPLASIA. - SEE COMMENT. Microscopic Comment Results are phoned to Dr. Olevia Perches. Internal departmental review obtained (Dr. Donato Heinz) with agreement.  Diagnosis 09/05/2014 Liver, biopsy - METASTATIC ADENOCARCINOMA, SEE COMMENT. Microscopic Comment Given the patient's history of sigmoid invasive adenocarcinoma and the morphology, the findings are consistent with metastatic colorectal adenocarcinoma. The case was called to Dr. Burr Medico. Per request, MMR, MSI, and KRAS testing will be ordered.  RADIOGRAPHIC STUDIES: I have personally reviewed the radiological images as listed and agreed with the findings in the report.  Ct Abdomen Pelvis W Contrast 07/14/2014   IMPRESSION: 6 cm segment of sigmoid bowel wall thickening concerning for neoplasm with adjacent 14 x 12 mm nodule which may reflect direct tumoral extension or nodal metastasis. Diffuse hepatic metastasis. No bowel obstruction. Recommend follow-up sigmoidoscopy/colonoscopy on a nonemergent basis.  Mild bladder wall thickening could reflect underdistention or cystitis.  Acute findings discussed with and reconfirmed by Dr.NATHAN PICKERING on 07/14/2014 at 1:36 am.   Electronically Signed   By:  Elon Alas M.D.   On: 07/14/2014 01:36   CT chest with contrast  07/28/2014 IMPRESSION: 1. No evidence for metastatic disease in the chest. 2. Bulky metastatic disease in the liver as seen on the previous abdomen and pelvis CT scan. Visualized hepatic lesions show no substantial interval change.  Colonoscopy 07/25/2014 Dr. Olevia Perches  ENDOSCOPIC IMPRESSION: Near circumferential medium mass was found in the sigmoid colon; multiple biopsies were performed using cold forceps, partially obstructing mass consistent with carcinoma. Placement of endoclips and tattoo at the margins of the mass which extends from 18-25 cm from the anus  ASSESSMENT & PLAN: 59 year old African-American female, presented with intermittent nausea vomiting and anemia.   1. Sigmoid colon cancer with liver metastasis, [id number], stage IV, MSI/RAS pending  -I reviewed her CT scans, colonoscopy, and colon mass biopsy findings with patient and her husband in great details, images were reviewed in pe -I reviewed her liver biopsy results, which confirmed metastasis from colon cancer. -The natural history of metastatic colon cancer and treatment options were discussed with patient and her husband. Giving the diffuse metastasis in the liver, this is unfortunately incurable or disease. I recommend systemic chemotherapy to control her metastatic disease, the goal of therapy is palliative and to prolong her life. -The options of first-line chemotherapy were discussed with her, I recommend FOLFOX and Avastin every 2 weeks intravenously, and I will continue as long as she tolerates and is working -Her initial colon mass biopsy tissue was not sufficient, I have requested MSI and RAS mutation test on her liver biopsy sample yesterday. -If her tumor is negative for extended RAS mutation, I would strongly favor EGFR inhibitor in addition to her chemotherapy.  She has metastatic left-sided colon cancer, which usually response well to EGFR inhibitor, which likely will prolong her PFS and her survival   -Lab reviewed,  she has significant neutropenia, ANC 0.6 today. We'll postpone her chemotherapy 2 next week. I'll cancel her 5-FU bolus, and add on Neulasta on day 3.  2. Anemia -Likely secondary to tumor bleeding and iron deficiency -Her ferritin is normal, serum iron and saturation slightly low, TIBC low normal, I suggest her to take oral iron supplement, she tolerates well, we'll continue once daily, with orange juice or vitamin C   3. DM, HTN -Continue follow-up with PCP - we discussed the potential impact of chemotherapy and premedication dexamethasone on her blood pressure and sugar,will  Monitor closely.  4. Depression -She appears depressed to me and we discussed the option of antidepressant medication. She declines at this point  PLan -Postpone her cycle 3 chemotherapy to next week, with Clomid of 5-FU bolus, add on Neulasta on day 3 - add on avastin next cycle  -I will see her back in 3 weeks before cycle 4   All questions were answered. The patient knows to call the clinic with any problems, questions or concerns. I spent 20 minutes counseling the patient face to face. The total time spent in the appointment was 30 minutes and more than 50% was on counseling.     Truitt Merle, MD 09/07/2014 9:43 AM

## 2014-09-11 ENCOUNTER — Encounter (HOSPITAL_COMMUNITY): Payer: Self-pay

## 2014-09-14 ENCOUNTER — Encounter: Payer: Self-pay | Admitting: Hematology

## 2014-09-14 ENCOUNTER — Ambulatory Visit: Payer: BLUE CROSS/BLUE SHIELD | Admitting: Hematology

## 2014-09-14 ENCOUNTER — Other Ambulatory Visit (HOSPITAL_COMMUNITY)
Admission: RE | Admit: 2014-09-14 | Discharge: 2014-09-14 | Disposition: A | Discharge status: Home / Self Care | Payer: BLUE CROSS/BLUE SHIELD | Source: Ambulatory Visit | Attending: Hematology | Admitting: Hematology

## 2014-09-14 ENCOUNTER — Ambulatory Visit (HOSPITAL_BASED_OUTPATIENT_CLINIC_OR_DEPARTMENT_OTHER): Payer: BLUE CROSS/BLUE SHIELD

## 2014-09-14 ENCOUNTER — Other Ambulatory Visit (HOSPITAL_BASED_OUTPATIENT_CLINIC_OR_DEPARTMENT_OTHER): Payer: BLUE CROSS/BLUE SHIELD

## 2014-09-14 VITALS — BP 161/73 | HR 79 | Temp 98.2°F | Resp 18

## 2014-09-14 DIAGNOSIS — C787 Secondary malignant neoplasm of liver and intrahepatic bile duct: Secondary | ICD-10-CM

## 2014-09-14 DIAGNOSIS — E119 Type 2 diabetes mellitus without complications: Secondary | ICD-10-CM | POA: Diagnosis not present

## 2014-09-14 DIAGNOSIS — C189 Malignant neoplasm of colon, unspecified: Secondary | ICD-10-CM | POA: Insufficient documentation

## 2014-09-14 DIAGNOSIS — C187 Malignant neoplasm of sigmoid colon: Secondary | ICD-10-CM

## 2014-09-14 DIAGNOSIS — Z5111 Encounter for antineoplastic chemotherapy: Secondary | ICD-10-CM

## 2014-09-14 DIAGNOSIS — D509 Iron deficiency anemia, unspecified: Secondary | ICD-10-CM | POA: Diagnosis not present

## 2014-09-14 DIAGNOSIS — Z5112 Encounter for antineoplastic immunotherapy: Secondary | ICD-10-CM

## 2014-09-14 LAB — COMPREHENSIVE METABOLIC PANEL (CC13)
ALBUMIN: 3.6 g/dL (ref 3.5–5.0)
ALK PHOS: 97 U/L (ref 40–150)
ALT: 26 U/L (ref 0–55)
AST: 25 U/L (ref 5–34)
Anion Gap: 8 mEq/L (ref 3–11)
BUN: 15.3 mg/dL (ref 7.0–26.0)
CALCIUM: 9.7 mg/dL (ref 8.4–10.4)
CO2: 26 mEq/L (ref 22–29)
Chloride: 106 mEq/L (ref 98–109)
Creatinine: 1.1 mg/dL (ref 0.6–1.1)
EGFR: 66 mL/min/{1.73_m2} — AB (ref >90)
Glucose: 146 mg/dl — ABNORMAL HIGH (ref 70–140)
POTASSIUM: 4.8 meq/L (ref 3.5–5.1)
Sodium: 139 mEq/L (ref 136–145)
Total Bilirubin: 0.25 mg/dL (ref 0.20–1.20)
Total Protein: 6.7 g/dL (ref 6.4–8.3)

## 2014-09-14 LAB — CBC WITH DIFFERENTIAL/PLATELET
BASO%: 2.5 % — ABNORMAL HIGH (ref 0.0–2.0)
BASOS ABS: 0.1 10*3/uL (ref 0.0–0.1)
EOS ABS: 0.1 10*3/uL (ref 0.0–0.5)
EOS%: 2.8 % (ref 0.0–7.0)
HEMATOCRIT: 29.5 % — AB (ref 34.8–46.6)
HEMOGLOBIN: 9.5 g/dL — AB (ref 11.6–15.9)
LYMPH#: 1.2 10*3/uL (ref 0.9–3.3)
LYMPH%: 30.6 % (ref 14.0–49.7)
MCH: 26.1 pg (ref 25.1–34.0)
MCHC: 32.2 g/dL (ref 31.5–36.0)
MCV: 81.2 fL (ref 79.5–101.0)
MONO#: 1 10*3/uL — ABNORMAL HIGH (ref 0.1–0.9)
MONO%: 24.3 % — ABNORMAL HIGH (ref 0.0–14.0)
NEUT#: 1.6 10*3/uL (ref 1.5–6.5)
NEUT%: 39.8 % (ref 38.4–76.8)
PLATELETS: 173 10*3/uL (ref 145–400)
RBC: 3.63 10*6/uL — ABNORMAL LOW (ref 3.70–5.45)
RDW: 17.9 % — AB (ref 11.2–14.5)
WBC: 4.1 10*3/uL (ref 3.9–10.3)

## 2014-09-14 LAB — UA PROTEIN, DIPSTICK - CHCC

## 2014-09-14 MED ORDER — LEUCOVORIN CALCIUM INJECTION 350 MG
402.0000 mg/m2 | Freq: Once | INTRAVENOUS | Status: AC
  Administered 2014-09-14: 720 mg via INTRAVENOUS
  Filled 2014-09-14: qty 36

## 2014-09-14 MED ORDER — SODIUM CHLORIDE 0.9 % IV SOLN
5.0000 mg/kg | Freq: Once | INTRAVENOUS | Status: AC
  Administered 2014-09-14: 350 mg via INTRAVENOUS
  Filled 2014-09-14: qty 14

## 2014-09-14 MED ORDER — SODIUM CHLORIDE 0.9 % IV SOLN
2400.0000 mg/m2 | INTRAVENOUS | Status: DC
  Administered 2014-09-14: 4300 mg via INTRAVENOUS
  Filled 2014-09-14: qty 86

## 2014-09-14 MED ORDER — SODIUM CHLORIDE 0.9 % IV SOLN
Freq: Once | INTRAVENOUS | Status: AC
  Administered 2014-09-14: 11:00:00 via INTRAVENOUS
  Filled 2014-09-14: qty 4

## 2014-09-14 MED ORDER — SODIUM CHLORIDE 0.9 % IV SOLN
Freq: Once | INTRAVENOUS | Status: AC
  Administered 2014-09-14: 12:00:00 via INTRAVENOUS

## 2014-09-14 MED ORDER — OXALIPLATIN CHEMO INJECTION 100 MG/20ML
85.0000 mg/m2 | Freq: Once | INTRAVENOUS | Status: AC
  Administered 2014-09-14: 150 mg via INTRAVENOUS
  Filled 2014-09-14: qty 30

## 2014-09-14 MED ORDER — DEXTROSE 5 % IV SOLN
Freq: Once | INTRAVENOUS | Status: AC
  Administered 2014-09-14: 11:00:00 via INTRAVENOUS

## 2014-09-14 NOTE — Patient Instructions (Addendum)
[facility] Discharge Instructions for Patients Receiving Chemotherapy  Today you received the following chemotherapy agents 12fu.oxaliplatin,leucovorin To help prevent nausea and vomiting after your treatment, we encourage you to take your nausea medication as prescribed   If you develop nausea and vomiting that is not controlled by your nausea medication, call the clinic.   BELOW ARE SYMPTOMS THAT SHOULD BE REPORTED IMMEDIATELY:  *FEVER GREATER THAN 100.5 F  *CHILLS WITH OR WITHOUT FEVER  NAUSEA AND VOMITING THAT IS NOT CONTROLLED WITH YOUR NAUSEA MEDICATION  *UNUSUAL SHORTNESS OF BREATH  *UNUSUAL BRUISING OR BLEEDING  TENDERNESS IN MOUTH AND THROAT WITH OR WITHOUT PRESENCE OF ULCERS  *URINARY PROBLEMS  *BOWEL PROBLEMS  UNUSUAL RASH Items with * indicate a potential emergency and should be followed up as soon as possible.  Feel free to call the clinic you have any questions or concerns. The clinic phone number is [phone].  Please show the [facility] at check-in to the Emergency Department and triage nurse.  Oxaliplatin Injection What is this medicine? OXALIPLATIN (ox AL i PLA tin) is a chemotherapy drug. It targets fast dividing cells, like cancer cells, and causes these cells to die. This medicine is used to treat cancers of the colon and rectum, and many other cancers. This medicine may be used for other purposes; ask your health care provider or pharmacist if you have questions. COMMON BRAND NAME(S): Eloxatin What should I tell my health care provider before I take this medicine? They need to know if you have any of these conditions: -kidney disease -an unusual or allergic reaction to oxaliplatin, other chemotherapy, other medicines, foods, dyes, or preservatives -pregnant or trying to get pregnant -breast-feeding How should I use this medicine? This drug is given as an infusion into a vein. It is administered in a hospital or clinic by a  specially trained health care professional. Talk to your pediatrician regarding the use of this medicine in children. Special care may be needed. Overdosage: If you think you have taken too much of this medicine contact a poison control center or emergency room at once. NOTE: This medicine is only for you. Do not share this medicine with others. What if I miss a dose? It is important not to miss a dose. Call your doctor or health care professional if you are unable to keep an appointment. What may interact with this medicine? -medicines to increase blood counts like filgrastim, pegfilgrastim, sargramostim -probenecid -some antibiotics like amikacin, gentamicin, neomycin, polymyxin B, streptomycin, tobramycin -zalcitabine Talk to your doctor or health care professional before taking any of these medicines: -acetaminophen -aspirin -ibuprofen -ketoprofen -naproxen This list may not describe all possible interactions. Give your health care provider a list of all the medicines, herbs, non-prescription drugs, or dietary supplements you use. Also tell them if you smoke, drink alcohol, or use illegal drugs. Some items may interact with your medicine. What should I watch for while using this medicine? Your condition will be monitored carefully while you are receiving this medicine. You will need important blood work done while you are taking this medicine. This medicine can make you more sensitive to cold. Do not drink cold drinks or use ice. Cover exposed skin before coming in contact with cold temperatures or cold objects. When out in cold weather wear warm clothing and cover your mouth and nose to warm the air that goes into your lungs. Tell your doctor if you get sensitive to the cold. This drug may make you  feel generally unwell. This is not uncommon, as chemotherapy can affect healthy cells as well as cancer cells. Report any side effects. Continue your course of treatment even though you feel ill  unless your doctor tells you to stop. In some cases, you may be given additional medicines to help with side effects. Follow all directions for their use. Call your doctor or health care professional for advice if you get a fever, chills or sore throat, or other symptoms of a cold or flu. Do not treat yourself. This drug decreases your body's ability to fight infections. Try to avoid being around people who are sick. This medicine may increase your risk to bruise or bleed. Call your doctor or health care professional if you notice any unusual bleeding. Be careful brushing and flossing your teeth or using a toothpick because you may get an infection or bleed more easily. If you have any dental work done, tell your dentist you are receiving this medicine. Avoid taking products that contain aspirin, acetaminophen, ibuprofen, naproxen, or ketoprofen unless instructed by your doctor. These medicines may hide a fever. Do not become pregnant while taking this medicine. Women should inform their doctor if they wish to become pregnant or think they might be pregnant. There is a potential for serious side effects to an unborn child. Talk to your health care professional or pharmacist for more information. Do not breast-feed an infant while taking this medicine. Call your doctor or health care professional if you get diarrhea. Do not treat yourself. What side effects may I notice from receiving this medicine? Side effects that you should report to your doctor or health care professional as soon as possible: -allergic reactions like skin rash, itching or hives, swelling of the face, lips, or tongue -low blood counts - This drug may decrease the number of white blood cells, red blood cells and platelets. You may be at increased risk for infections and bleeding. -signs of infection - fever or chills, cough, sore throat, pain or difficulty passing urine -signs of decreased platelets or bleeding - bruising, pinpoint  red spots on the skin, black, tarry stools, nosebleeds -signs of decreased red blood cells - unusually weak or tired, fainting spells, lightheadedness -breathing problems -chest pain, pressure -cough -diarrhea -jaw tightness -mouth sores -nausea and vomiting -pain, swelling, redness or irritation at the injection site -pain, tingling, numbness in the hands or feet -problems with balance, talking, walking -redness, blistering, peeling or loosening of the skin, including inside the mouth -trouble passing urine or change in the amount of urine Side effects that usually do not require medical attention (report to your doctor or health care professional if they continue or are bothersome): -changes in vision -constipation -hair loss -loss of appetite -metallic taste in the mouth or changes in taste -stomach pain This list may not describe all possible side effects. Call your doctor for medical advice about side effects. You may report side effects to FDA at 1-800-FDA-1088. Where should I keep my medicine? This drug is given in a hospital or clinic and will not be stored at home. NOTE: This sheet is a summary. It may not cover all possible information. If you have questions about this medicine, talk to your doctor, pharmacist, or health care provider.  2015, Elsevier/Gold Standard. (2007-08-03 17:22:47)

## 2014-09-16 ENCOUNTER — Ambulatory Visit (HOSPITAL_BASED_OUTPATIENT_CLINIC_OR_DEPARTMENT_OTHER): Payer: BLUE CROSS/BLUE SHIELD

## 2014-09-16 ENCOUNTER — Ambulatory Visit: Payer: BLUE CROSS/BLUE SHIELD

## 2014-09-16 VITALS — BP 144/71 | HR 82 | Temp 98.6°F | Resp 18

## 2014-09-16 DIAGNOSIS — Z5189 Encounter for other specified aftercare: Secondary | ICD-10-CM | POA: Diagnosis not present

## 2014-09-16 DIAGNOSIS — C787 Secondary malignant neoplasm of liver and intrahepatic bile duct: Secondary | ICD-10-CM

## 2014-09-16 DIAGNOSIS — C189 Malignant neoplasm of colon, unspecified: Secondary | ICD-10-CM

## 2014-09-16 DIAGNOSIS — C187 Malignant neoplasm of sigmoid colon: Secondary | ICD-10-CM

## 2014-09-16 MED ORDER — HEPARIN SOD (PORK) LOCK FLUSH 100 UNIT/ML IV SOLN
500.0000 [IU] | Freq: Once | INTRAVENOUS | Status: AC | PRN
  Administered 2014-09-16: 500 [IU]
  Filled 2014-09-16: qty 5

## 2014-09-16 MED ORDER — SODIUM CHLORIDE 0.9 % IJ SOLN
10.0000 mL | INTRAMUSCULAR | Status: DC | PRN
  Administered 2014-09-16: 10 mL
  Filled 2014-09-16: qty 10

## 2014-09-16 MED ORDER — PEGFILGRASTIM INJECTION 6 MG/0.6ML ~~LOC~~
6.0000 mg | PREFILLED_SYRINGE | Freq: Once | SUBCUTANEOUS | Status: AC
  Administered 2014-09-16: 6 mg via SUBCUTANEOUS

## 2014-09-20 ENCOUNTER — Telehealth: Payer: Self-pay | Admitting: Hematology

## 2014-09-20 ENCOUNTER — Ambulatory Visit: Payer: BLUE CROSS/BLUE SHIELD

## 2014-09-20 ENCOUNTER — Encounter: Payer: Self-pay | Admitting: Hematology

## 2014-09-20 ENCOUNTER — Ambulatory Visit (HOSPITAL_BASED_OUTPATIENT_CLINIC_OR_DEPARTMENT_OTHER): Payer: BLUE CROSS/BLUE SHIELD | Admitting: Hematology

## 2014-09-20 ENCOUNTER — Other Ambulatory Visit (HOSPITAL_BASED_OUTPATIENT_CLINIC_OR_DEPARTMENT_OTHER): Payer: BLUE CROSS/BLUE SHIELD

## 2014-09-20 VITALS — BP 123/57 | HR 86 | Temp 98.7°F | Resp 18 | Ht 64.0 in | Wt 150.8 lb

## 2014-09-20 DIAGNOSIS — C187 Malignant neoplasm of sigmoid colon: Secondary | ICD-10-CM | POA: Diagnosis not present

## 2014-09-20 DIAGNOSIS — D500 Iron deficiency anemia secondary to blood loss (chronic): Secondary | ICD-10-CM

## 2014-09-20 DIAGNOSIS — C787 Secondary malignant neoplasm of liver and intrahepatic bile duct: Secondary | ICD-10-CM | POA: Diagnosis not present

## 2014-09-20 DIAGNOSIS — I10 Essential (primary) hypertension: Secondary | ICD-10-CM

## 2014-09-20 DIAGNOSIS — E119 Type 2 diabetes mellitus without complications: Secondary | ICD-10-CM | POA: Diagnosis not present

## 2014-09-20 DIAGNOSIS — C189 Malignant neoplasm of colon, unspecified: Secondary | ICD-10-CM

## 2014-09-20 LAB — COMPREHENSIVE METABOLIC PANEL (CC13)
ALBUMIN: 3.6 g/dL (ref 3.5–5.0)
ALK PHOS: 173 U/L — AB (ref 40–150)
ALT: 23 U/L (ref 0–55)
ANION GAP: 7 meq/L (ref 3–11)
AST: 18 U/L (ref 5–34)
BILIRUBIN TOTAL: 0.34 mg/dL (ref 0.20–1.20)
BUN: 17.7 mg/dL (ref 7.0–26.0)
CO2: 26 mEq/L (ref 22–29)
Calcium: 9.3 mg/dL (ref 8.4–10.4)
Chloride: 104 mEq/L (ref 98–109)
Creatinine: 1.2 mg/dL — ABNORMAL HIGH (ref 0.6–1.1)
EGFR: 59 mL/min/{1.73_m2} — AB (ref >90)
Glucose: 248 mg/dl — ABNORMAL HIGH (ref 70–140)
POTASSIUM: 4.8 meq/L (ref 3.5–5.1)
Sodium: 137 mEq/L (ref 136–145)
TOTAL PROTEIN: 6.7 g/dL (ref 6.4–8.3)

## 2014-09-20 LAB — CBC WITH DIFFERENTIAL/PLATELET
BASO%: 0.5 % (ref 0.0–2.0)
BASOS ABS: 0.1 10*3/uL (ref 0.0–0.1)
EOS ABS: 0.2 10*3/uL (ref 0.0–0.5)
EOS%: 1 % (ref 0.0–7.0)
HCT: 30.5 % — ABNORMAL LOW (ref 34.8–46.6)
HEMOGLOBIN: 9.4 g/dL — AB (ref 11.6–15.9)
LYMPH%: 6.3 % — AB (ref 14.0–49.7)
MCH: 25.5 pg (ref 25.1–34.0)
MCHC: 30.8 g/dL — ABNORMAL LOW (ref 31.5–36.0)
MCV: 82.7 fL (ref 79.5–101.0)
MONO#: 0.5 10*3/uL (ref 0.1–0.9)
MONO%: 3.3 % (ref 0.0–14.0)
NEUT%: 88.9 % — ABNORMAL HIGH (ref 38.4–76.8)
NEUTROS ABS: 14 10*3/uL — AB (ref 1.5–6.5)
PLATELETS: 105 10*3/uL — AB (ref 145–400)
RBC: 3.68 10*6/uL — ABNORMAL LOW (ref 3.70–5.45)
RDW: 19 % — AB (ref 11.2–14.5)
WBC: 15.8 10*3/uL — AB (ref 3.9–10.3)
lymph#: 1 10*3/uL (ref 0.9–3.3)

## 2014-09-20 NOTE — Progress Notes (Signed)
[facility]  [phone] [phone]  Clinic New Consult Note   Patient Care Team: Joette Catching, MD as PCP - General (Family Medicine) Wandalee Ferdinand, RN as Registered Nurse (Oncology) 09/20/2014  CHIEF COMPLAINTS:  Follow-up of metastatic colon cancer  Oncology History   Metastatic colon cancer to liver   Staging form: Colon and Rectum, AJCC 7th Edition     Clinical: Stage Unknown (TX, NX, M1) - Unsigned        Metastatic colon cancer to liver   07/14/2014 Imaging CT abdomen and pelvis with contrast showed a 6 cm segment of sigmoid bowel wall thickening with adjacent 14 x 12 mm nodules, diffuse hepatic metastasis. No bowel obstruction.   07/14/2014 Initial Diagnosis Metastatic colon cancer to liver   07/25/2014 Procedure Colonoscopy showed a near circumferential medial mass in the sigmoid colon, partially obstructing consistent with carcinoma.   07/25/2014 Initial Biopsy Sigmoid: Biopsy showed invasive adenocarcinoma arising in a background of high-grade dysplasia.   07/28/2014 Imaging CT chest showed no evidence of metastasis   08/09/2014 -  Chemotherapy FOLFOX every 2 weeks    08/11/2014 - 08/15/2014 Hospital Admission Pt was admitted for fever and nausea, treated for UTI, liver biopsy cancelled due to the infection     HISTORY OF PRESENTING ILLNESS:  Michelle Erickson 59 y.o. female is here because of abnoaml CT findings which is highly suspecious for metastatic colon cancer.   She presented intermittent nausea, anorexia and abdominal pain since Nov 2015, she has mild pain in the LUQ, crapy pain, positional (bending over),   it happened once every 2-3 weeks, and it has been more frequent in the past month. She has normal BM, but did notice the caliber of the stool is smaller than before. She denies any melena or hematochezia. She lost about 13 lbs in the past 6 months.   She presents to local emergency room in  November 2015, and May 2016, was felt to be related to gastric virus. Due to the worsening symptoms lately, she went to [facility] ED on 07/13/2014, CT abdomen was obtained which showed multiple large liver metastasis and probable sigmoid colon mass. She was referred to [place] for further workup. She is scheduled to see GI Dr. Juanda Chance this afternoon.  INTERIM HISTORY Michelle Erickson returns for follow-up after third cycle chemo. She did well with this treatment, mild fatigue, no nausea vomiting, or diarrhea. No fever or chills. She has been eating well, and appears to be more cheerful today. She is accompanied by her husband to the clinic today. No significant pain, dyspnea, or GI symptoms.  MEDICAL HISTORY:  Past Medical History  Diagnosis Date  . Hyperlipemia   . Diabetes mellitus without complication   . Hypertension   . Metastatic colon cancer to liver 07/21/2014  . Wears glasses     contacts/glasses  . Snoring   . Back pain   . Family history of colon cancer     SURGICAL HISTORY: Past Surgical History  Procedure Laterality Date  . Mouth surgery    . Cataract extraction Bilateral   . Portacath placement N/A 08/03/2014    Procedure: INSERTION PORT-A-CATH;  Surgeon: Almond Lint, MD;  Location: [facility] OR;  Service: General;  Laterality: N/A;    SOCIAL HISTORY: Social History   Social History  . Marital Status: Married    Spouse Name: Bethann Berkshire  . Number of Children: 2  . Years of Education: N/A   Occupational History  .  Not on file.   Social History Main Topics  . Smoking status: Former Smoker -- 1.00 packs/day for 6 years    Types: Cigarettes    Quit date: 01/21/1976  . Smokeless tobacco: Not on file  . Alcohol Use: No  . Drug Use: No  . Sexual Activity: Yes    Birth Control/ Protection: Post-menopausal     Comment: # 2 pregnancies and #2 live births   Other Topics Concern  . Not on file   Social History Narrative   Married, husband [name]   Has #2 adult children    Previously worked at [employer] HISTORY: Family History  Problem Relation Age of Onset  . COPD Father   . Heart attack Father   . Arthritis Sister   . Diabetes Sister   . Colon cancer Sister     dx over 23  . Colon polyps Sister   . Seizures Brother   . Colon cancer Sister     dx in her early 89s, died in her early to mid 53s  . Diabetes Mother     ALLERGIES:  has No Known Allergies.  MEDICATIONS:  Current Outpatient Prescriptions  Medication Sig Dispense Refill  . atorvastatin (LIPITOR) 40 MG tablet Take 40 mg by mouth daily.    . ferrous sulfate 325 (65 FE) MG tablet Take 325 mg by mouth daily with breakfast.    . Insulin Glargine (LANTUS SOLOSTAR) 100 UNIT/ML Solostar Pen Inject 23 Units into the skin daily at 10 pm.    . insulin lispro (HUMALOG) 100 UNIT/ML injection Inject 4-8 Units into the skin 2 (two) times daily as needed for high blood sugar. Sliding scale    . lidocaine-prilocaine (EMLA) cream     . LORazepam (ATIVAN) 0.5 MG tablet Take 1 tablet (0.5 mg total) by mouth every 6 (six) hours as needed for anxiety. 30 tablet 0  . ondansetron (ZOFRAN-ODT) 8 MG disintegrating tablet Take 8 mg by mouth every 8 (eight) hours as needed for nausea or vomiting.    Marland Kitchen amLODipine (NORVASC) 5 MG tablet Take 5 mg by mouth daily.    Marland Kitchen losartan (COZAAR) 50 MG tablet Take 50 mg by mouth.    . oxyCODONE-acetaminophen (ROXICET) 5-325 MG per tablet Take 1-2 tablets by mouth every 4 (four) hours as needed for severe pain. (Patient not taking: Reported on 08/31/2014) 30 tablet 0   No current facility-administered medications for this visit.    REVIEW OF SYSTEMS:   Constitutional: Denies fevers, chills or abnormal night sweats Eyes: Denies blurriness of vision, double vision or watery eyes Ears, nose, mouth, throat, and face: Denies mucositis or sore throat Respiratory: Denies cough, dyspnea or wheezes Cardiovascular: Denies palpitation, chest discomfort or lower  extremity swelling Gastrointestinal:  Denies nausea, heartburn or change in bowel habits Skin: Denies abnormal skin rashes Lymphatics: Denies new lymphadenopathy or easy bruising Neurological:Denies numbness, tingling or new weaknesses Behavioral/Psych: Mood is stable, no new changes  All other systems were reviewed with the patient and are negative.  PHYSICAL EXAMINATION: ECOG PERFORMANCE STATUS: 1 - Symptomatic but completely ambulatory  Filed Vitals:   09/20/14 1233  BP: 123/57  Pulse: 86  Temp: 98.7 F (37.1 C)  Resp: 18   Filed Weights   09/20/14 1233  Weight: 150 lb 12.8 oz (68.402 kg)    GENERAL:alert, no distress and comfortable SKIN: skin color, texture, turgor are normal, no rashes or significant lesions EYES: normal, conjunctiva are pink  and non-injected, sclera clear OROPHARYNX:no exudate, no erythema and lips, buccal mucosa, and tongue normal  NECK: supple, thyroid normal size, non-tender, without nodularity LYMPH:  no palpable lymphadenopathy in the cervical, axillary or inguinal LUNGS: clear to auscultation and percussion with normal breathing effort HEART: regular rate & rhythm and no murmurs and no lower extremity edema ABDOMEN:abdomen soft, non-tender and normal bowel sounds Musculoskeletal:no cyanosis of digits and no clubbing  PSYCH: alert & oriented x 3 with fluent speech NEURO: no focal motor/sensory deficits  LABORATORY DATA:  I have reviewed the data as listed CBC Latest Ref Rng 09/20/2014 09/14/2014 09/07/2014  WBC 3.9 - 10.3 10e3/uL 15.8(H) 4.1 1.8(L)  Hemoglobin 11.6 - 15.9 g/dL 9.4(L) 9.5(L) 9.4(L)  Hematocrit 34.8 - 46.6 % 30.5(L) 29.5(L) 29.7(L)  Platelets 145 - 400 10e3/uL 105(L) 173 134(L)     Recent Labs  07/13/14 2051  08/11/14 2047 08/13/14 0558 08/14/14 0435  09/07/14 0855 09/14/14 0921 09/20/14 1210  NA  --   < > 133* 138 135  < > 137 139 137  K  --   < > 4.3 4.0 4.0  < > 4.4 4.8 4.8  CL  --   < > 98* 109 105  --   --   --    --   CO2  --   < > $R'26 26 25  'Ok$ < > $R'25 26 26  'Lk$ GLUCOSE  --   < > 97 110* 138*  < > 236* 146* 248*  BUN  --   < > 22* 18 18  < > 14.2 15.3 17.7  CREATININE  --   < > 0.98 1.04* 0.94  < > 1.1 1.1 1.2*  CALCIUM  --   < > 8.8* 8.2* 8.3*  < > 9.7 9.7 9.3  GFRNONAA  --   < > >60 57* >60  --   --   --   --   GFRAA  --   < > >60 >60 >60  --   --   --   --   PROT 7.6  < > 7.6 5.9* 6.5  < > 7.0 6.7 6.7  ALBUMIN 3.8  < > 3.7 2.7* 2.9*  < > 3.7 3.6 3.6  AST 65*  < > 70* 31 30  < > $R'23 25 18  'KF$ ALT 54  < > 51 28 28  < > $R'22 26 23  'FY$ ALKPHOS 146*  < > 178* 134* 146*  < > 122 97 173*  BILITOT 0.9  < > 0.7 1.0 0.7  < > 0.25 0.25 0.34  BILIDIR 0.2  --   --   --  0.1  --   --   --   --   IBILI 0.7  --   --   --  0.6  --   --   --   --   < > = values in this interval not displayed.  PATHOLOGY REPORTS: Diagnosis 07/25/2014  Surgical [P], sigmoid - INVASIVE ADENOCARCINOMA ARISING IN A BACKGROUND OF HIGH GRADE DYSPLASIA. - SEE COMMENT. Microscopic Comment Results are phoned to Dr. Olevia Perches. Internal departmental review obtained (Dr. Donato Heinz) with agreement.  Diagnosis 09/05/2014 Liver, biopsy - METASTATIC ADENOCARCINOMA, SEE COMMENT. Microscopic Comment Given the patient's history of sigmoid invasive adenocarcinoma and the morphology, the findings are consistent with metastatic colorectal adenocarcinoma. The case was called to Dr. Burr Medico. Per request, MMR, MSI, and KRAS testing will be ordered.  RADIOGRAPHIC STUDIES: I have personally reviewed the radiological images  as listed and agreed with the findings in the report.  Ct Abdomen Pelvis W Contrast 07/14/2014   IMPRESSION: 6 cm segment of sigmoid bowel wall thickening concerning for neoplasm with adjacent 14 x 12 mm nodule which may reflect direct tumoral extension or nodal metastasis. Diffuse hepatic metastasis. No bowel obstruction. Recommend follow-up sigmoidoscopy/colonoscopy on a nonemergent basis.  Mild bladder wall thickening could reflect underdistention or  cystitis.  Acute findings discussed with and reconfirmed by Dr.NATHAN PICKERING on 07/14/2014 at 1:36 am.   Electronically Signed   By: Elon Alas M.D.   On: 07/14/2014 01:36   CT chest with contrast 07/28/2014 IMPRESSION: 1. No evidence for metastatic disease in the chest. 2. Bulky metastatic disease in the liver as seen on the previous abdomen and pelvis CT scan. Visualized hepatic lesions show no substantial interval change.  Colonoscopy 07/25/2014 Dr. Olevia Perches  ENDOSCOPIC IMPRESSION: Near circumferential medium mass was found in the sigmoid colon; multiple biopsies were performed using cold forceps, partially obstructing mass consistent with carcinoma. Placement of endoclips and tattoo at the margins of the mass which extends from 18-25 cm from the anus  ASSESSMENT & PLAN: 59 year old African-American female, presented with intermittent nausea vomiting and anemia.   1. Sigmoid colon cancer with liver metastasis, TxNxM1, stage IV, MSI stable, KRS/NRS pending  -I reviewed her CT scans, colonoscopy, and colon mass biopsy findings with patient and her husband in great details, images were reviewed in person  -I reviewed her liver biopsy results, which confirmed metastasis from colon cancer. -The natural history of metastatic colon cancer and treatment options were discussed with patient and her husband. Giving the diffuse metastasis in the liver, this is unfortunately incurable disease. I recommend systemic chemotherapy to control her metastatic disease, the goal of therapy is palliative and to prolong her life. -The options of first-line chemotherapy were discussed with her, I recommend FOLFOX and Avastin every 2 weeks intravenously, and I will continue as long as she tolerates and no disease progression -her tumor MSI is stable  -If her tumor is negative for extended RAS mutation, I would strongly favor EGFR inhibitor in addition to her chemotherapy.  She has metastatic left-sided colon  cancer, which usually response well to EGFR inhibitor, and it will prolong her PFS and her survival   -lab reviewed, mild thrombocytopenia secondary to chemotherapy. -She will return next week for cycle 4 chemotherapy -Restage CT after 4th cycle chemotherapy.  2. Anemia -Likely secondary to tumor bleeding and iron deficiency -Her ferritin is normal, serum iron and saturation slightly low, TIBC low normal, I suggest her to take oral iron supplement, she tolerates well, we'll continue once daily, with orange juice or vitamin C   3. DM, HTN -Continue follow-up with PCP - we discussed the potential impact of chemotherapy and premedication dexamethasone on her blood pressure and sugar,will  Monitor closely.  4. Depression -She appears depressed to me and we discussed the option of antidepressant medication. She declines at this point   PLan -cycle 4 FOLFOX and avastin next week if lab adequate  -I will see her back before 5th cycle chemo on 9/22 with a restaging CT   All questions were answered. The patient knows to call the clinic with any problems, questions or concerns. I spent 20 minutes counseling the patient face to face. The total time spent in the appointment was 30 minutes and more than 50% was on counseling.     Truitt Merle, MD 09/20/2014 1:05 PM

## 2014-09-20 NOTE — Telephone Encounter (Signed)
Gave patient avs report and appointiments for September. Per 8/31 pof patient needs imaging study a few days prior to 9/22. Message to YF - no imaging order present. Once entered central will contact patient with appointment - patient aware.

## 2014-09-21 ENCOUNTER — Encounter (HOSPITAL_COMMUNITY): Payer: Self-pay

## 2014-09-22 ENCOUNTER — Encounter (HOSPITAL_COMMUNITY): Payer: Self-pay

## 2014-09-28 ENCOUNTER — Ambulatory Visit (HOSPITAL_BASED_OUTPATIENT_CLINIC_OR_DEPARTMENT_OTHER): Payer: BLUE CROSS/BLUE SHIELD | Admitting: Hematology

## 2014-09-28 ENCOUNTER — Encounter: Payer: Self-pay | Admitting: Hematology

## 2014-09-28 ENCOUNTER — Other Ambulatory Visit (HOSPITAL_BASED_OUTPATIENT_CLINIC_OR_DEPARTMENT_OTHER): Payer: BLUE CROSS/BLUE SHIELD

## 2014-09-28 ENCOUNTER — Encounter: Payer: Self-pay | Admitting: *Deleted

## 2014-09-28 ENCOUNTER — Ambulatory Visit (HOSPITAL_BASED_OUTPATIENT_CLINIC_OR_DEPARTMENT_OTHER): Payer: BLUE CROSS/BLUE SHIELD

## 2014-09-28 VITALS — BP 150/83 | HR 85 | Temp 98.5°F | Resp 18 | Ht 64.0 in | Wt 154.2 lb

## 2014-09-28 VITALS — BP 138/82

## 2014-09-28 DIAGNOSIS — C187 Malignant neoplasm of sigmoid colon: Secondary | ICD-10-CM

## 2014-09-28 DIAGNOSIS — C189 Malignant neoplasm of colon, unspecified: Secondary | ICD-10-CM

## 2014-09-28 DIAGNOSIS — C787 Secondary malignant neoplasm of liver and intrahepatic bile duct: Secondary | ICD-10-CM

## 2014-09-28 DIAGNOSIS — E119 Type 2 diabetes mellitus without complications: Secondary | ICD-10-CM

## 2014-09-28 DIAGNOSIS — Z5112 Encounter for antineoplastic immunotherapy: Secondary | ICD-10-CM | POA: Diagnosis not present

## 2014-09-28 DIAGNOSIS — Z5111 Encounter for antineoplastic chemotherapy: Secondary | ICD-10-CM | POA: Diagnosis not present

## 2014-09-28 DIAGNOSIS — D500 Iron deficiency anemia secondary to blood loss (chronic): Secondary | ICD-10-CM | POA: Diagnosis not present

## 2014-09-28 DIAGNOSIS — I10 Essential (primary) hypertension: Secondary | ICD-10-CM

## 2014-09-28 LAB — CBC WITH DIFFERENTIAL/PLATELET
BASO%: 0.4 % (ref 0.0–2.0)
BASOS ABS: 0.1 10*3/uL (ref 0.0–0.1)
EOS ABS: 0.3 10*3/uL (ref 0.0–0.5)
EOS%: 2.1 % (ref 0.0–7.0)
HCT: 29.2 % — ABNORMAL LOW (ref 34.8–46.6)
HEMOGLOBIN: 9.4 g/dL — AB (ref 11.6–15.9)
LYMPH%: 12.7 % — AB (ref 14.0–49.7)
MCH: 26.7 pg (ref 25.1–34.0)
MCHC: 32.1 g/dL (ref 31.5–36.0)
MCV: 83.3 fL (ref 79.5–101.0)
MONO#: 1.5 10*3/uL — ABNORMAL HIGH (ref 0.1–0.9)
MONO%: 11 % (ref 0.0–14.0)
NEUT#: 10 10*3/uL — ABNORMAL HIGH (ref 1.5–6.5)
NEUT%: 73.8 % (ref 38.4–76.8)
Platelets: 107 10*3/uL — ABNORMAL LOW (ref 145–400)
RBC: 3.51 10*6/uL — ABNORMAL LOW (ref 3.70–5.45)
RDW: 19.5 % — ABNORMAL HIGH (ref 11.2–14.5)
WBC: 13.6 10*3/uL — ABNORMAL HIGH (ref 3.9–10.3)
lymph#: 1.7 10*3/uL (ref 0.9–3.3)

## 2014-09-28 LAB — COMPREHENSIVE METABOLIC PANEL (CC13)
ALBUMIN: 3.6 g/dL (ref 3.5–5.0)
ALK PHOS: 148 U/L (ref 40–150)
ALT: 31 U/L (ref 0–55)
AST: 30 U/L (ref 5–34)
Anion Gap: 9 mEq/L (ref 3–11)
BUN: 21.8 mg/dL (ref 7.0–26.0)
CHLORIDE: 103 meq/L (ref 98–109)
CO2: 24 mEq/L (ref 22–29)
Calcium: 9.4 mg/dL (ref 8.4–10.4)
Creatinine: 1.3 mg/dL — ABNORMAL HIGH (ref 0.6–1.1)
EGFR: 54 mL/min/{1.73_m2} — AB (ref >90)
GLUCOSE: 355 mg/dL — AB (ref 70–140)
POTASSIUM: 4.7 meq/L (ref 3.5–5.1)
SODIUM: 136 meq/L (ref 136–145)
Total Bilirubin: 0.35 mg/dL (ref 0.20–1.20)
Total Protein: 6.8 g/dL (ref 6.4–8.3)

## 2014-09-28 LAB — CEA: CEA: 182.4 ng/mL — AB (ref 0.0–5.0)

## 2014-09-28 MED ORDER — SODIUM CHLORIDE 0.9 % IV SOLN
2400.0000 mg/m2 | INTRAVENOUS | Status: DC
  Administered 2014-09-28: 4300 mg via INTRAVENOUS
  Filled 2014-09-28: qty 86

## 2014-09-28 MED ORDER — SODIUM CHLORIDE 0.9 % IV SOLN
5.0000 mg/kg | Freq: Once | INTRAVENOUS | Status: AC
  Administered 2014-09-28: 350 mg via INTRAVENOUS
  Filled 2014-09-28: qty 14

## 2014-09-28 MED ORDER — LEUCOVORIN CALCIUM INJECTION 350 MG
402.0000 mg/m2 | Freq: Once | INTRAVENOUS | Status: AC
  Administered 2014-09-28: 720 mg via INTRAVENOUS
  Filled 2014-09-28: qty 36

## 2014-09-28 MED ORDER — DEXTROSE 5 % IV SOLN
85.0000 mg/m2 | Freq: Once | INTRAVENOUS | Status: AC
  Administered 2014-09-28: 150 mg via INTRAVENOUS
  Filled 2014-09-28: qty 30

## 2014-09-28 MED ORDER — SODIUM CHLORIDE 0.9 % IV SOLN
Freq: Once | INTRAVENOUS | Status: AC
  Administered 2014-09-28: 11:00:00 via INTRAVENOUS
  Filled 2014-09-28: qty 4

## 2014-09-28 MED ORDER — DEXTROSE 5 % IV SOLN
Freq: Once | INTRAVENOUS | Status: AC
  Administered 2014-09-28: 11:00:00 via INTRAVENOUS

## 2014-09-28 NOTE — Progress Notes (Signed)
[place]  [phone] Fax:([phone]) [place] Note   Patient Care Team: Dione Housekeeper, MD as PCP - General (Family Medicine) Tania Ade, RN as Registered Nurse (Oncology) 09/28/2014  CHIEF COMPLAINTS:  Follow-up of metastatic colon cancer  Oncology History   Metastatic colon cancer to liver   Staging form: Colon and Rectum, AJCC 7th Edition     Clinical: Stage Unknown ([place], NX, M1) - Unsigned        Metastatic colon cancer to liver   07/14/2014 Imaging CT abdomen and pelvis with contrast showed a 6 cm segment of sigmoid bowel wall thickening with adjacent 14 x 12 mm nodules, diffuse hepatic metastasis. No bowel obstruction.   07/14/2014 Initial Diagnosis Metastatic colon cancer to liver   07/25/2014 Procedure Colonoscopy showed a near circumferential medial mass in the sigmoid colon, partially obstructing consistent with carcinoma.   07/25/2014 Initial Biopsy Sigmoid: Biopsy showed invasive adenocarcinoma arising in a background of high-grade dysplasia.   07/28/2014 Imaging CT chest showed no evidence of metastasis   08/09/2014 -  Chemotherapy FOLFOX every 2 weeks    08/11/2014 - 08/15/2014 Hospital Admission Pt was admitted for fever and nausea, treated for UTI, liver biopsy cancelled due to the infection     HISTORY OF PRESENTING ILLNESS:  Michelle Erickson 59 y.o. female is here because of abnoaml CT findings which is highly suspecious for metastatic colon cancer.   She presented intermittent nausea, anorexia and abdominal pain since Nov 2015, she has mild pain in the LUQ, crapy pain, positional (bending over),   it happened once every 2-3 weeks, and it has been more frequent in the past month. She has normal BM, but did notice the caliber of the stool is smaller than before. She denies any melena or hematochezia. She lost about 13 lbs in the past 6 months.   She presents to local emergency room in  November 2015, and May 2016, was felt to be related to gastric virus. Due to the worsening symptoms lately, she went to [facility] ED on 07/13/2014, CT abdomen was obtained which showed multiple large liver metastasis and probable sigmoid colon mass. She was referred to [place] for further workup. She is scheduled to see GI Dr. Olevia Perches this afternoon.  INTERIM HISTORY Michelle Erickson returns for follow-up and 4th cycle chemo. She has been tolerating chemotherapy well, had a milder nausea, anorexia after chemotherapy, no vomiting, or diarrhea, and recovered well. She denies any fever or chills. No pain, dyspnea or other complaints. Her weight has been stable.  MEDICAL HISTORY:  Past Medical History  Diagnosis Date  . Hyperlipemia   . Diabetes mellitus without complication   . Hypertension   . Metastatic colon cancer to liver 07/21/2014  . Wears glasses     contacts/glasses  . Snoring   . Back pain   . Family history of colon cancer     SURGICAL HISTORY: Past Surgical History  Procedure Laterality Date  . Mouth surgery    . Cataract extraction Bilateral   . Portacath placement N/A 08/03/2014    Procedure: INSERTION PORT-A-CATH;  Surgeon: Stark Klein, MD;  Location: [facility];  Service: General;  Laterality: N/A;    SOCIAL HISTORY: Social History   Social History  . Marital Status: Married    Spouse Name: Charlotte Crumb  . Number of Children: 2  . Years of Education: N/A   Occupational History  . Not on file.   Social History Main Topics  .  Smoking status: Former Smoker -- 1.00 packs/day for 6 years    Types: Cigarettes    Quit date: 01/21/1976  . Smokeless tobacco: Not on file  . Alcohol Use: No  . Drug Use: No  . Sexual Activity: Yes    Birth Control/ Protection: Post-menopausal     Comment: # 2 pregnancies and #2 live births   Other Topics Concern  . Not on file   Social History Narrative   Married, husband [name]   Has #2 adult children   Previously worked at [employer] HISTORY: Family History  Problem Relation Age of Onset  . COPD Father   . Heart attack Father   . Arthritis Sister   . Diabetes Sister   . Colon cancer Sister     dx over 61  . Colon polyps Sister   . Seizures Brother   . Colon cancer Sister     dx in her early 65s, died in her early to mid 66s  . Diabetes Mother     ALLERGIES:  has No Known Allergies.  MEDICATIONS:  Current Outpatient Prescriptions  Medication Sig Dispense Refill  . amLODipine (NORVASC) 5 MG tablet Take 2.5 mg by mouth daily.     Marland Kitchen atorvastatin (LIPITOR) 40 MG tablet Take 40 mg by mouth daily.    . ferrous sulfate 325 (65 FE) MG tablet Take 325 mg by mouth daily with breakfast.    . Insulin Glargine (LANTUS SOLOSTAR) 100 UNIT/ML Solostar Pen Inject 23 Units into the skin daily at 10 pm.    . insulin lispro (HUMALOG) 100 UNIT/ML injection Inject 4-8 Units into the skin 2 (two) times daily as needed for high blood sugar. Sliding scale    . lidocaine-prilocaine (EMLA) cream     . LORazepam (ATIVAN) 0.5 MG tablet Take 1 tablet (0.5 mg total) by mouth every 6 (six) hours as needed for anxiety. (Patient not taking: Reported on 09/20/2014) 30 tablet 0  . ondansetron (ZOFRAN-ODT) 8 MG disintegrating tablet Take 8 mg by mouth every 8 (eight) hours as needed for nausea or vomiting.    Marland Kitchen oxyCODONE-acetaminophen (ROXICET) 5-325 MG per tablet Take 1-2 tablets by mouth every 4 (four) hours as needed for severe pain. (Patient not taking: Reported on 08/31/2014) 30 tablet 0   No current facility-administered medications for this visit.    REVIEW OF SYSTEMS:   Constitutional: Denies fevers, chills or abnormal night sweats Eyes: Denies blurriness of vision, double vision or watery eyes Ears, nose, mouth, throat, and face: Denies mucositis or sore throat Respiratory: Denies cough, dyspnea or wheezes Cardiovascular: Denies palpitation, chest discomfort or lower extremity swelling Gastrointestinal:  Denies nausea,  heartburn or change in bowel habits Skin: Denies abnormal skin rashes Lymphatics: Denies new lymphadenopathy or easy bruising Neurological:Denies numbness, tingling or new weaknesses Behavioral/Psych: Mood is stable, no new changes  All other systems were reviewed with the patient and are negative.  PHYSICAL EXAMINATION: ECOG PERFORMANCE STATUS: 1 - Symptomatic but completely ambulatory  Filed Vitals:   09/28/14 0931  BP: 150/83  Pulse: 85  Temp: 98.5 F (36.9 C)  Resp: 18   Filed Weights   09/28/14 0931  Weight: 154 lb 3.2 oz (69.945 kg)    GENERAL:alert, no distress and comfortable SKIN: skin color, texture, turgor are normal, no rashes or significant lesions EYES: normal, conjunctiva are pink and non-injected, sclera clear OROPHARYNX:no exudate, no erythema and lips, buccal mucosa, and tongue normal  NECK:  supple, thyroid normal size, non-tender, without nodularity LYMPH:  no palpable lymphadenopathy in the cervical, axillary or inguinal LUNGS: clear to auscultation and percussion with normal breathing effort HEART: regular rate & rhythm and no murmurs and no lower extremity edema ABDOMEN:abdomen soft, non-tender and normal bowel sounds Musculoskeletal:no cyanosis of digits and no clubbing  PSYCH: alert & oriented x 3 with fluent speech NEURO: no focal motor/sensory deficits  LABORATORY DATA:  I have reviewed the data as listed CBC Latest Ref Rng 09/28/2014 09/20/2014 09/14/2014  WBC 3.9 - 10.3 10e3/uL 13.6(H) 15.8(H) 4.1  Hemoglobin 11.6 - 15.9 g/dL 9.4(L) 9.4(L) 9.5(L)  Hematocrit 34.8 - 46.6 % 29.2(L) 30.5(L) 29.5(L)  Platelets 145 - 400 10e3/uL 107(L) 105(L) 173     Recent Labs  07/13/14 2051  08/11/14 2047 08/13/14 0558 08/14/14 0435  09/14/14 0921 09/20/14 1210 09/28/14 0913  NA  --   < > 133* 138 135  < > 139 137 136  K  --   < > 4.3 4.0 4.0  < > 4.8 4.8 4.7  CL  --   < > 98* 109 105  --   --   --   --   CO2  --   < > $R'26 26 25  'ZM$ < > $R'26 26 24  'zY$ GLUCOSE   --   < > 97 110* 138*  < > 146* 248* 355*  BUN  --   < > 22* 18 18  < > 15.3 17.7 21.8  CREATININE  --   < > 0.98 1.04* 0.94  < > 1.1 1.2* 1.3*  CALCIUM  --   < > 8.8* 8.2* 8.3*  < > 9.7 9.3 9.4  GFRNONAA  --   < > >60 57* >60  --   --   --   --   GFRAA  --   < > >60 >60 >60  --   --   --   --   PROT 7.6  < > 7.6 5.9* 6.5  < > 6.7 6.7 6.8  ALBUMIN 3.8  < > 3.7 2.7* 2.9*  < > 3.6 3.6 3.6  AST 65*  < > 70* 31 30  < > $R'25 18 30  'tD$ ALT 54  < > 51 28 28  < > $R'26 23 31  'oG$ ALKPHOS 146*  < > 178* 134* 146*  < > 97 173* 148  BILITOT 0.9  < > 0.7 1.0 0.7  < > 0.25 0.34 0.35  BILIDIR 0.2  --   --   --  0.1  --   --   --   --   IBILI 0.7  --   --   --  0.6  --   --   --   --   < > = values in this interval not displayed.  CEA  Status: Finalresult Visible to patient:  Not Released Nextappt: 09/30/2014 at 12:45 PM in Oncology [facility] Inj Nurse) Dx:  Metastatic colon cancer to liver              Ref Range 1d ago  3wk ago  37mo ago  26mo ago     CEA 0.0 - 5.0 ng/mL 182.4 (H) 555.1 (H)CM 606.7 (H)CM 594.5 (H)CM         PATHOLOGY REPORTS: Diagnosis 07/25/2014  Surgical [P], sigmoid - INVASIVE ADENOCARCINOMA ARISING IN A BACKGROUND OF HIGH GRADE DYSPLASIA. - SEE COMMENT. Microscopic Comment Results are phoned to Dr. Olevia Perches. Internal departmental review obtained (Dr.  Rund) with agreement.  Diagnosis 09/05/2014 Liver, biopsy - METASTATIC ADENOCARCINOMA, SEE COMMENT. Microscopic Comment Given the patient's history of sigmoid invasive adenocarcinoma and the morphology, the findings are consistent with metastatic colorectal adenocarcinoma. The case was called to Dr. Burr Medico. Per request, MMR, MSI, and KRAS testing will be ordered.  KRAS MUTATION (+) c36G>A (p. G12D)  RADIOGRAPHIC STUDIES: I have personally reviewed the radiological images as listed and agreed with the findings in the report.  Ct Abdomen Pelvis W Contrast 07/14/2014   IMPRESSION: 6 cm segment of sigmoid bowel  wall thickening concerning for neoplasm with adjacent 14 x 12 mm nodule which may reflect direct tumoral extension or nodal metastasis. Diffuse hepatic metastasis. No bowel obstruction. Recommend follow-up sigmoidoscopy/colonoscopy on a nonemergent basis.  Mild bladder wall thickening could reflect underdistention or cystitis.  Acute findings discussed with and reconfirmed by Dr.NATHAN PICKERING on 07/14/2014 at 1:36 am.   Electronically Signed   By: Elon Alas M.D.   On: 07/14/2014 01:36   CT chest with contrast 07/28/2014 IMPRESSION: 1. No evidence for metastatic disease in the chest. 2. Bulky metastatic disease in the liver as seen on the previous abdomen and pelvis CT scan. Visualized hepatic lesions show no substantial interval change.  Colonoscopy 07/25/2014 Dr. Olevia Perches  ENDOSCOPIC IMPRESSION: Near circumferential medium mass was found in the sigmoid colon; multiple biopsies were performed using cold forceps, partially obstructing mass consistent with carcinoma. Placement of endoclips and tattoo at the margins of the mass which extends from 18-25 cm from the anus  ASSESSMENT & PLAN: 59 year old African-American female, presented with intermittent nausea vomiting and anemia.   1. Sigmoid colon cancer with liver metastasis, TxNxM1, stage IV, MSI stable, KRAS mutation(+) -I reviewed her CT scans, colonoscopy, and colon mass biopsy findings with patient and her husband in great details, images were reviewed in person  -I reviewed her liver biopsy results, which confirmed metastasis from colon cancer. -The natural history of metastatic colon cancer and treatment options were discussed with patient and her husband. Giving the diffuse metastasis in the liver, this is unfortunately incurable disease. I recommend systemic chemotherapy to control her metastatic disease, the goal of therapy is palliative and to prolong her life. -The options of first-line chemotherapy were discussed with her, I  recommend FOLFOX and Avastin every 2 weeks intravenously, and I will continue as long as she tolerates and no disease progression -her tumor MSI is stable  -I discussed her KRAS mutation result, unfortunately she would not benefit from EGFR inhibitors   -lab reviewed, mild thrombocytopenia secondary to chemotherapy. -will proceed cycle 4 chemotherapy -Restage CT after this cycle chemo   2. Anemia -Likely secondary to tumor bleeding and iron deficiency -Her ferritin is normal, serum iron and saturation slightly low, TIBC low normal, I suggest her to take oral iron supplement, she tolerates well, we'll continue once daily, with orange juice or vitamin C   3. DM, HTN -Continue follow-up with PCP - we discussed the potential impact of chemotherapy and premedication dexamethasone on her blood pressure and sugar,will  Monitor closely.  4. Depression -She appears depressed to me and we discussed the option of antidepressant medication. She declines at this point -she seems to be more    PLan -cycle 4 FOLFOX and avastin today  -I will see her back before 5th cycle chemo on 9/22 with a restaging CT   All questions were answered. The patient knows to call the clinic with any problems, questions or concerns. I spent 10 minutes  counseling the patient face to face. The total time spent in the appointment was 15 minutes and more than 50% was on counseling.     Truitt Merle, MD 09/28/2014 7:08 AM

## 2014-09-28 NOTE — Patient Instructions (Signed)
[facility] Discharge Instructions for Patients Receiving Chemotherapy  Today you received the following chemotherapy agents 5fu.oxaliplatin,leucovorin To help prevent nausea and vomiting after your treatment, we encourage you to take your nausea medication as prescribed   If you develop nausea and vomiting that is not controlled by your nausea medication, call the clinic.   BELOW ARE SYMPTOMS THAT SHOULD BE REPORTED IMMEDIATELY:  *FEVER GREATER THAN 100.5 F  *CHILLS WITH OR WITHOUT FEVER  NAUSEA AND VOMITING THAT IS NOT CONTROLLED WITH YOUR NAUSEA MEDICATION  *UNUSUAL SHORTNESS OF BREATH  *UNUSUAL BRUISING OR BLEEDING  TENDERNESS IN MOUTH AND THROAT WITH OR WITHOUT PRESENCE OF ULCERS  *URINARY PROBLEMS  *BOWEL PROBLEMS  UNUSUAL RASH Items with * indicate a potential emergency and should be followed up as soon as possible.  Feel free to call the clinic you have any questions or concerns. The clinic phone number is [phone].  Please show the CHEMO ALERT CARD at check-in to the Emergency Department and triage nurse.  Oxaliplatin Injection What is this medicine? OXALIPLATIN (ox AL i PLA tin) is a chemotherapy drug. It targets fast dividing cells, like cancer cells, and causes these cells to die. This medicine is used to treat cancers of the colon and rectum, and many other cancers. This medicine may be used for other purposes; ask your health care provider or pharmacist if you have questions. COMMON BRAND NAME(S): Eloxatin What should I tell my health care provider before I take this medicine? They need to know if you have any of these conditions: -kidney disease -an unusual or allergic reaction to oxaliplatin, other chemotherapy, other medicines, foods, dyes, or preservatives -pregnant or trying to get pregnant -breast-feeding How should I use this medicine? This drug is given as an infusion into a vein. It is administered in a hospital or clinic by a  specially trained health care professional. Talk to your pediatrician regarding the use of this medicine in children. Special care may be needed. Overdosage: If you think you have taken too much of this medicine contact a poison control center or emergency room at once. NOTE: This medicine is only for you. Do not share this medicine with others. What if I miss a dose? It is important not to miss a dose. Call your doctor or health care professional if you are unable to keep an appointment. What may interact with this medicine? -medicines to increase blood counts like filgrastim, pegfilgrastim, sargramostim -probenecid -some antibiotics like amikacin, gentamicin, neomycin, polymyxin B, streptomycin, tobramycin -zalcitabine Talk to your doctor or health care professional before taking any of these medicines: -acetaminophen -aspirin -ibuprofen -ketoprofen -naproxen This list may not describe all possible interactions. Give your health care provider a list of all the medicines, herbs, non-prescription drugs, or dietary supplements you use. Also tell them if you smoke, drink alcohol, or use illegal drugs. Some items may interact with your medicine. What should I watch for while using this medicine? Your condition will be monitored carefully while you are receiving this medicine. You will need important blood work done while you are taking this medicine. This medicine can make you more sensitive to cold. Do not drink cold drinks or use ice. Cover exposed skin before coming in contact with cold temperatures or cold objects. When out in cold weather wear warm clothing and cover your mouth and nose to warm the air that goes into your lungs. Tell your doctor if you get sensitive to the cold. This drug may make you   feel generally unwell. This is not uncommon, as chemotherapy can affect healthy cells as well as cancer cells. Report any side effects. Continue your course of treatment even though you feel ill  unless your doctor tells you to stop. In some cases, you may be given additional medicines to help with side effects. Follow all directions for their use. Call your doctor or health care professional for advice if you get a fever, chills or sore throat, or other symptoms of a cold or flu. Do not treat yourself. This drug decreases your body's ability to fight infections. Try to avoid being around people who are sick. This medicine may increase your risk to bruise or bleed. Call your doctor or health care professional if you notice any unusual bleeding. Be careful brushing and flossing your teeth or using a toothpick because you may get an infection or bleed more easily. If you have any dental work done, tell your dentist you are receiving this medicine. Avoid taking products that contain aspirin, acetaminophen, ibuprofen, naproxen, or ketoprofen unless instructed by your doctor. These medicines may hide a fever. Do not become pregnant while taking this medicine. Women should inform their doctor if they wish to become pregnant or think they might be pregnant. There is a potential for serious side effects to an unborn child. Talk to your health care professional or pharmacist for more information. Do not breast-feed an infant while taking this medicine. Call your doctor or health care professional if you get diarrhea. Do not treat yourself. What side effects may I notice from receiving this medicine? Side effects that you should report to your doctor or health care professional as soon as possible: -allergic reactions like skin rash, itching or hives, swelling of the face, lips, or tongue -low blood counts - This drug may decrease the number of white blood cells, red blood cells and platelets. You may be at increased risk for infections and bleeding. -signs of infection - fever or chills, cough, sore throat, pain or difficulty passing urine -signs of decreased platelets or bleeding - bruising, pinpoint  red spots on the skin, black, tarry stools, nosebleeds -signs of decreased red blood cells - unusually weak or tired, fainting spells, lightheadedness -breathing problems -chest pain, pressure -cough -diarrhea -jaw tightness -mouth sores -nausea and vomiting -pain, swelling, redness or irritation at the injection site -pain, tingling, numbness in the hands or feet -problems with balance, talking, walking -redness, blistering, peeling or loosening of the skin, including inside the mouth -trouble passing urine or change in the amount of urine Side effects that usually do not require medical attention (report to your doctor or health care professional if they continue or are bothersome): -changes in vision -constipation -hair loss -loss of appetite -metallic taste in the mouth or changes in taste -stomach pain This list may not describe all possible side effects. Call your doctor for medical advice about side effects. You may report side effects to FDA at 1-800-FDA-1088. Where should I keep my medicine? This drug is given in a hospital or clinic and will not be stored at home. NOTE: This sheet is a summary. It may not cover all possible information. If you have questions about this medicine, talk to your doctor, pharmacist, or health care provider.  2015, [employer]vier/Gold Standard. (2007-08-03 17:22:47)  

## 2014-09-28 NOTE — Progress Notes (Signed)
Oncology Nurse Navigator Documentation  Oncology Nurse Navigator Flowsheets 09/28/2014  Referral date to RadOnc/MedOnc -  Navigator Encounter Type Treatment  Patient Visit Type Medonc  Treatment Phase Treatment  Barriers/Navigation Needs No barriers at this time  Interventions None required  Support Groups/Services -  Time Spent with Patient Michelle Erickson looks great today and her affect is much brighter. She is feeling well, tolerating chemo and denies any needs.

## 2014-09-30 ENCOUNTER — Ambulatory Visit (HOSPITAL_BASED_OUTPATIENT_CLINIC_OR_DEPARTMENT_OTHER): Payer: BLUE CROSS/BLUE SHIELD

## 2014-09-30 VITALS — BP 133/67 | HR 78 | Temp 98.4°F | Resp 18

## 2014-09-30 DIAGNOSIS — C187 Malignant neoplasm of sigmoid colon: Secondary | ICD-10-CM

## 2014-09-30 DIAGNOSIS — C787 Secondary malignant neoplasm of liver and intrahepatic bile duct: Secondary | ICD-10-CM

## 2014-09-30 DIAGNOSIS — Z5189 Encounter for other specified aftercare: Secondary | ICD-10-CM

## 2014-09-30 DIAGNOSIS — C189 Malignant neoplasm of colon, unspecified: Secondary | ICD-10-CM

## 2014-09-30 MED ORDER — HEPARIN SOD (PORK) LOCK FLUSH 100 UNIT/ML IV SOLN
500.0000 [IU] | Freq: Once | INTRAVENOUS | Status: AC | PRN
  Administered 2014-09-30: 500 [IU]
  Filled 2014-09-30: qty 5

## 2014-09-30 MED ORDER — SODIUM CHLORIDE 0.9 % IJ SOLN
10.0000 mL | INTRAMUSCULAR | Status: DC | PRN
  Administered 2014-09-30: 10 mL
  Filled 2014-09-30: qty 10

## 2014-09-30 MED ORDER — PEGFILGRASTIM INJECTION 6 MG/0.6ML ~~LOC~~
6.0000 mg | PREFILLED_SYRINGE | Freq: Once | SUBCUTANEOUS | Status: AC
Start: 2014-09-30 — End: 2014-09-30
  Administered 2014-09-30: 6 mg via SUBCUTANEOUS

## 2014-10-10 ENCOUNTER — Ambulatory Visit (HOSPITAL_COMMUNITY)
Admission: RE | Admit: 2014-10-10 | Discharge: 2014-10-10 | Disposition: A | Discharge status: Home / Self Care | Payer: BLUE CROSS/BLUE SHIELD | Source: Ambulatory Visit | Attending: Hematology | Admitting: Hematology

## 2014-10-10 ENCOUNTER — Encounter (HOSPITAL_COMMUNITY): Payer: Self-pay

## 2014-10-10 DIAGNOSIS — C787 Secondary malignant neoplasm of liver and intrahepatic bile duct: Secondary | ICD-10-CM | POA: Insufficient documentation

## 2014-10-10 DIAGNOSIS — C189 Malignant neoplasm of colon, unspecified: Secondary | ICD-10-CM | POA: Diagnosis present

## 2014-10-10 DIAGNOSIS — R918 Other nonspecific abnormal finding of lung field: Secondary | ICD-10-CM | POA: Diagnosis not present

## 2014-10-10 MED ORDER — IOHEXOL 300 MG/ML  SOLN
100.0000 mL | Freq: Once | INTRAMUSCULAR | Status: AC | PRN
  Administered 2014-10-10: 100 mL via INTRAVENOUS

## 2014-10-10 MED ORDER — IOHEXOL 300 MG/ML  SOLN
50.0000 mL | Freq: Once | INTRAMUSCULAR | Status: AC | PRN
  Administered 2014-10-10: 50 mL via ORAL

## 2014-10-12 ENCOUNTER — Ambulatory Visit (HOSPITAL_BASED_OUTPATIENT_CLINIC_OR_DEPARTMENT_OTHER): Payer: BLUE CROSS/BLUE SHIELD

## 2014-10-12 ENCOUNTER — Other Ambulatory Visit (HOSPITAL_BASED_OUTPATIENT_CLINIC_OR_DEPARTMENT_OTHER): Payer: BLUE CROSS/BLUE SHIELD

## 2014-10-12 ENCOUNTER — Encounter: Payer: Self-pay | Admitting: Hematology

## 2014-10-12 ENCOUNTER — Telehealth: Payer: Self-pay | Admitting: Hematology

## 2014-10-12 ENCOUNTER — Ambulatory Visit (HOSPITAL_BASED_OUTPATIENT_CLINIC_OR_DEPARTMENT_OTHER): Payer: BLUE CROSS/BLUE SHIELD | Admitting: Hematology

## 2014-10-12 VITALS — BP 169/60 | HR 88 | Temp 97.1°F | Resp 16 | Ht 64.0 in | Wt 156.1 lb

## 2014-10-12 VITALS — BP 150/57

## 2014-10-12 DIAGNOSIS — Z5112 Encounter for antineoplastic immunotherapy: Secondary | ICD-10-CM | POA: Diagnosis not present

## 2014-10-12 DIAGNOSIS — C787 Secondary malignant neoplasm of liver and intrahepatic bile duct: Secondary | ICD-10-CM

## 2014-10-12 DIAGNOSIS — C187 Malignant neoplasm of sigmoid colon: Secondary | ICD-10-CM | POA: Diagnosis not present

## 2014-10-12 DIAGNOSIS — C189 Malignant neoplasm of colon, unspecified: Secondary | ICD-10-CM

## 2014-10-12 DIAGNOSIS — Z5111 Encounter for antineoplastic chemotherapy: Secondary | ICD-10-CM

## 2014-10-12 LAB — COMPREHENSIVE METABOLIC PANEL (CC13)
ALK PHOS: 181 U/L — AB (ref 40–150)
ALT: 38 U/L (ref 0–55)
ANION GAP: 8 meq/L (ref 3–11)
AST: 36 U/L — ABNORMAL HIGH (ref 5–34)
Albumin: 3.6 g/dL (ref 3.5–5.0)
BUN: 13.9 mg/dL (ref 7.0–26.0)
CO2: 26 meq/L (ref 22–29)
Calcium: 9.1 mg/dL (ref 8.4–10.4)
Chloride: 107 mEq/L (ref 98–109)
Creatinine: 1.5 mg/dL — ABNORMAL HIGH (ref 0.6–1.1)
EGFR: 43 mL/min/{1.73_m2} — AB (ref >90)
Glucose: 188 mg/dl — ABNORMAL HIGH (ref 70–140)
Potassium: 4.4 mEq/L (ref 3.5–5.1)
Sodium: 141 mEq/L (ref 136–145)
TOTAL PROTEIN: 6.5 g/dL (ref 6.4–8.3)

## 2014-10-12 LAB — CBC WITH DIFFERENTIAL/PLATELET
BASO%: 0.2 % (ref 0.0–2.0)
BASOS ABS: 0 10*3/uL (ref 0.0–0.1)
EOS%: 0.7 % (ref 0.0–7.0)
Eosinophils Absolute: 0.1 10*3/uL (ref 0.0–0.5)
HEMATOCRIT: 28.5 % — AB (ref 34.8–46.6)
HGB: 9.1 g/dL — ABNORMAL LOW (ref 11.6–15.9)
LYMPH%: 8.1 % — ABNORMAL LOW (ref 14.0–49.7)
MCH: 27 pg (ref 25.1–34.0)
MCHC: 32 g/dL (ref 31.5–36.0)
MCV: 84.2 fL (ref 79.5–101.0)
MONO#: 1.6 10*3/uL — ABNORMAL HIGH (ref 0.1–0.9)
MONO%: 9 % (ref 0.0–14.0)
NEUT#: 14.4 10*3/uL — ABNORMAL HIGH (ref 1.5–6.5)
NEUT%: 82 % — AB (ref 38.4–76.8)
Platelets: 123 10*3/uL — ABNORMAL LOW (ref 145–400)
RBC: 3.38 10*6/uL — ABNORMAL LOW (ref 3.70–5.45)
RDW: 21.3 % — ABNORMAL HIGH (ref 11.2–14.5)
WBC: 17.5 10*3/uL — ABNORMAL HIGH (ref 3.9–10.3)
lymph#: 1.4 10*3/uL (ref 0.9–3.3)

## 2014-10-12 MED ORDER — SODIUM CHLORIDE 0.9 % IV SOLN
2400.0000 mg/m2 | INTRAVENOUS | Status: DC
  Administered 2014-10-12: 4300 mg via INTRAVENOUS
  Filled 2014-10-12: qty 86

## 2014-10-12 MED ORDER — LEUCOVORIN CALCIUM INJECTION 350 MG
402.0000 mg/m2 | Freq: Once | INTRAMUSCULAR | Status: AC
  Administered 2014-10-12: 720 mg via INTRAVENOUS
  Filled 2014-10-12: qty 36

## 2014-10-12 MED ORDER — SODIUM CHLORIDE 0.9 % IV SOLN
INTRAVENOUS | Status: AC
  Administered 2014-10-12: 12:00:00 via INTRAVENOUS

## 2014-10-12 MED ORDER — DEXTROSE 5 % IV SOLN
Freq: Once | INTRAVENOUS | Status: AC
  Administered 2014-10-12: 12:00:00 via INTRAVENOUS

## 2014-10-12 MED ORDER — BEVACIZUMAB CHEMO INJECTION 400 MG/16ML
5.0000 mg/kg | Freq: Once | INTRAVENOUS | Status: AC
  Administered 2014-10-12: 350 mg via INTRAVENOUS
  Filled 2014-10-12: qty 14

## 2014-10-12 MED ORDER — SODIUM CHLORIDE 0.9 % IV SOLN
Freq: Once | INTRAVENOUS | Status: AC
  Administered 2014-10-12: 15:00:00 via INTRAVENOUS
  Filled 2014-10-12: qty 4

## 2014-10-12 MED ORDER — OXALIPLATIN CHEMO INJECTION 100 MG/20ML
85.0000 mg/m2 | Freq: Once | INTRAVENOUS | Status: AC
  Administered 2014-10-12: 150 mg via INTRAVENOUS
  Filled 2014-10-12: qty 20

## 2014-10-12 NOTE — Telephone Encounter (Signed)
Michelle C. gv pt sched

## 2014-10-12 NOTE — Progress Notes (Signed)
[place]  [phone] [phone]  Clinic Follow Up Note   Patient Care Team: Dione Housekeeper, MD as PCP - General (Family Medicine) Tania Ade, RN as Registered Nurse (Oncology) 10/12/2014  CHIEF COMPLAINTS:  Follow-up of metastatic colon cancer  Oncology History   Metastatic colon cancer to liver   Staging form: Colon and Rectum, AJCC 7th Edition     Clinical: Stage Unknown ([place], NX, M1) - Unsigned        Metastatic colon cancer to liver   07/14/2014 Imaging CT abdomen and pelvis with contrast showed a 6 cm segment of sigmoid bowel wall thickening with adjacent 14 x 12 mm nodules, diffuse hepatic metastasis. No bowel obstruction.   07/14/2014 Initial Diagnosis Metastatic colon cancer to liver   07/25/2014 Procedure Colonoscopy showed a near circumferential medial mass in the sigmoid colon, partially obstructing consistent with carcinoma.   07/25/2014 Initial Biopsy Sigmoid: Biopsy showed invasive adenocarcinoma arising in a background of high-grade dysplasia.   07/28/2014 Imaging CT chest showed no evidence of metastasis   08/09/2014 -  Chemotherapy FOLFOX every 2 weeks    08/11/2014 - 08/15/2014 Hospital Admission Pt was admitted for fever and nausea, treated for UTI, liver biopsy cancelled due to the infection     HISTORY OF PRESENTING ILLNESS:  Michelle Erickson 59 y.o. female is here because of abnoaml CT findings which is highly suspecious for metastatic colon cancer.   She presented intermittent nausea, anorexia and abdominal pain since Nov 2015, she has mild pain in the LUQ, crapy pain, positional (bending over),   it happened once every 2-3 weeks, and it has been more frequent in the past month. She has normal BM, but did notice the caliber of the stool is smaller than before. She denies Michelle melena or hematochezia. She lost about 13 lbs in the past 6 months.   She presents to local emergency room in  November 2015, and May 2016, was felt to be related to gastric virus. Due to the worsening symptoms lately, she went to [facility] ED on 07/13/2014, CT abdomen was obtained which showed multiple large liver metastasis and probable sigmoid colon mass. She was referred to [place] for further workup. She is scheduled to see GI Dr. Olevia Perches this afternoon.  INTERIM HISTORY Michelle Erickson returns for follow-up and 5th cycle chemo. She is doing well overall, no significant pain, nausea, diarrhea or other symptoms. Mild fatigue is stable. Her appetite is fair. No significant weight loss.   MEDICAL HISTORY:  Past Medical History  Diagnosis Date  . Hyperlipemia   . Hypertension   . Metastatic colon cancer to liver 07/21/2014  . Wears glasses     contacts/glasses  . Snoring   . Back pain   . Family history of colon cancer   . Diabetes mellitus without complication     SURGICAL HISTORY: Past Surgical History  Procedure Laterality Date  . Mouth surgery    . Cataract extraction Bilateral   . Portacath placement N/A 08/03/2014    Procedure: INSERTION PORT-A-CATH;  Surgeon: Stark Klein, MD;  Location: [place];  Service: General;  Laterality: N/A;    SOCIAL HISTORY: Social History   Social History  . Marital Status: Married    Spouse Name: Michelle Erickson  . Number of Children: 2  . Years of Education: N/A   Occupational History  . Not on file.   Social History Main Topics  . Smoking status: Former Smoker -- 1.00 packs/day for 6  years    Types: Cigarettes    Quit date: 01/21/1976  . Smokeless tobacco: Not on file  . Alcohol Use: No  . Drug Use: No  . Sexual Activity: Yes    Birth Control/ Protection: Post-menopausal     Comment: # 2 pregnancies and #2 live births   Other Topics Concern  . Not on file   Social History Narrative   Married, husband Michelle Erickson   Has #2 adult children   Previously worked at [employer] HISTORY: Family History  Problem Relation Age of Onset  . COPD  Father   . Heart attack Father   . Arthritis Sister   . Diabetes Sister   . Colon cancer Sister     dx over 62  . Colon polyps Sister   . Seizures Brother   . Colon cancer Sister     dx in her early 89s, died in her early to mid 33s  . Diabetes Mother     ALLERGIES:  has No Known Allergies.  MEDICATIONS:  Current Outpatient Prescriptions  Medication Sig Dispense Refill  . amLODipine (NORVASC) 5 MG tablet Take 2.5 mg by mouth daily.     Marland Kitchen atorvastatin (LIPITOR) 40 MG tablet Take 40 mg by mouth daily.    . ferrous sulfate 325 (65 FE) MG tablet Take 325 mg by mouth daily with breakfast.    . Insulin Glargine (LANTUS SOLOSTAR) 100 UNIT/ML Solostar Pen Inject 23 Units into the skin daily at 10 pm.    . lidocaine-prilocaine (EMLA) cream     . ondansetron (ZOFRAN-ODT) 8 MG disintegrating tablet Take 8 mg by mouth every 8 (eight) hours as needed for nausea or vomiting.    . insulin lispro (HUMALOG) 100 UNIT/ML injection Inject 4-8 Units into the skin 2 (two) times daily as needed for high blood sugar. Sliding scale     No current facility-administered medications for this visit.    REVIEW OF SYSTEMS:   Constitutional: Denies fevers, chills or abnormal night sweats Eyes: Denies blurriness of vision, double vision or watery eyes Ears, nose, mouth, throat, and face: Denies mucositis or sore throat Respiratory: Denies cough, dyspnea or wheezes Cardiovascular: Denies palpitation, chest discomfort or lower extremity swelling Gastrointestinal:  Denies nausea, heartburn or change in bowel habits Skin: Denies abnormal skin rashes Lymphatics: Denies new lymphadenopathy or easy bruising Neurological:Denies numbness, tingling or new weaknesses Behavioral/Psych: Mood is stable, no new changes  All other systems were reviewed with the patient and are negative.  PHYSICAL EXAMINATION: ECOG PERFORMANCE STATUS: 1 - Symptomatic but completely ambulatory  Filed Vitals:   10/12/14 0940  BP: 169/60    Pulse: 88  Temp: 97.1 F (36.2 C)  Resp: 16   Filed Weights   10/12/14 0940  Weight: 156 lb 1.6 oz (70.806 kg)    GENERAL:alert, no distress and comfortable SKIN: skin color, texture, turgor are normal, no rashes or significant lesions EYES: normal, conjunctiva are pink and non-injected, sclera clear OROPHARYNX:no exudate, no erythema and lips, buccal mucosa, and tongue normal  NECK: supple, thyroid normal size, non-tender, without nodularity LYMPH:  no palpable lymphadenopathy in the cervical, axillary or inguinal LUNGS: clear to auscultation and percussion with normal breathing effort HEART: regular rate & rhythm and no murmurs and no lower extremity edema ABDOMEN:abdomen soft, non-tender and normal bowel sounds Musculoskeletal:no cyanosis of digits and no clubbing  PSYCH: alert & oriented x 3 with fluent speech NEURO: no focal motor/sensory deficits  LABORATORY  DATA:  I have reviewed the data as listed CBC Latest Ref Rng 10/12/2014 09/28/2014 09/20/2014  WBC 3.9 - 10.3 10e3/uL 17.5(H) 13.6(H) 15.8(H)  Hemoglobin 11.6 - 15.9 g/dL 9.1(L) 9.4(L) 9.4(L)  Hematocrit 34.8 - 46.6 % 28.5(L) 29.2(L) 30.5(L)  Platelets 145 - 400 10e3/uL 123(L) 107(L) 105(L)     Recent Labs  07/13/14 2051  08/11/14 2047 08/13/14 0558 08/14/14 0435  09/20/14 1210 09/28/14 0913 10/12/14 0813  NA  --   < > 133* 138 135  < > 137 136 141  K  --   < > 4.3 4.0 4.0  < > 4.8 4.7 4.4  CL  --   < > 98* 109 105  --   --   --   --   CO2  --   < > $R'26 26 25  'Gl$ < > $R'26 24 26  'er$ GLUCOSE  --   < > 97 110* 138*  < > 248* 355* 188*  BUN  --   < > 22* 18 18  < > 17.7 21.8 13.9  CREATININE  --   < > 0.98 1.04* 0.94  < > 1.2* 1.3* 1.5*  CALCIUM  --   < > 8.8* 8.2* 8.3*  < > 9.3 9.4 9.1  GFRNONAA  --   < > >60 57* >60  --   --   --   --   GFRAA  --   < > >60 >60 >60  --   --   --   --   PROT 7.6  < > 7.6 5.9* 6.5  < > 6.7 6.8 6.5  ALBUMIN 3.8  < > 3.7 2.7* 2.9*  < > 3.6 3.6 3.6  AST 65*  < > 70* 31 30  < > 18 30 36*   ALT 54  < > 51 28 28  < > 23 31 38  ALKPHOS 146*  < > 178* 134* 146*  < > 173* 148 181*  BILITOT 0.9  < > 0.7 1.0 0.7  < > 0.34 0.35 <0.30  BILIDIR 0.2  --   --   --  0.1  --   --   --   --   IBILI 0.7  --   --   --  0.6  --   --   --   --   < > = values in this interval not displayed.  CEA  Status: Finalresult Visible to patient:  Not Released Nextappt: 09/30/2014 at 12:45 PM in Oncology [facility] Inj Nurse) Dx:  Metastatic colon cancer to liver              Ref Range 1d ago  3wk ago  60mo ago  75mo ago     CEA 0.0 - 5.0 ng/mL 182.4 (H) 555.1 (H)CM 606.7 (H)CM 594.5 (H)CM         PATHOLOGY REPORTS: Diagnosis 07/25/2014  Surgical [P], sigmoid - INVASIVE ADENOCARCINOMA ARISING IN A BACKGROUND OF HIGH GRADE DYSPLASIA. - SEE COMMENT. Microscopic Comment Results are phoned to Dr. Olevia Perches. Internal departmental review obtained (Dr. Donato Heinz) with agreement.  Diagnosis 09/05/2014 Liver, biopsy - METASTATIC ADENOCARCINOMA, SEE COMMENT. Microscopic Comment Given the patient's history of sigmoid invasive adenocarcinoma and the morphology, the findings are consistent with metastatic colorectal adenocarcinoma. The case was called to Dr. Burr Medico. Per request, MMR, MSI, and KRAS testing will be ordered.  KRAS MUTATION (+) c36G>A (p. G12D)  RADIOGRAPHIC STUDIES: I have personally reviewed the radiological images as listed and agreed  with the findings in the report.   CT chest, abdomen and pelvis with contrast 10/10/2014 IMPRESSION: Interval decrease in size of multiple hepatic metastatic lesions.  Interval decrease in size of soft tissue mass adjacent to the sigmoid colon.  Interval decrease in wall thickening of the sigmoid colon.   Colonoscopy 07/25/2014 Dr. Olevia Perches  ENDOSCOPIC IMPRESSION: Near circumferential medium mass was found in the sigmoid colon; multiple biopsies were performed using cold forceps, partially obstructing mass consistent with carcinoma.  Placement of endoclips and tattoo at the margins of the mass which extends from 18-25 cm from the anus  ASSESSMENT & PLAN: 59 year old African-American female, presented with intermittent nausea vomiting and anemia.   1. Sigmoid colon cancer with liver metastasis, TxNxM1, stage IV, MSI stable, KRAS mutation(+) -I reviewed her CT scans, colonoscopy, and colon mass biopsy findings with patient and her husband in great details, images were reviewed in person  -I reviewed her liver biopsy results, which confirmed metastasis from colon cancer. -The natural history of metastatic colon cancer and treatment options were discussed with patient and her husband. Giving the diffuse metastasis in the liver, this is unfortunately incurable disease. I recommend systemic chemotherapy to control her metastatic disease, the goal of therapy is palliative and to prolong her life. -The options of first-line chemotherapy were discussed with her, I recommend FOLFOX and Avastin every 2 weeks intravenously, and I will continue as long as she tolerates and no disease progression -her tumor MSI is stable, KRAS mutated, not a candidate for immunotherapy or EGFR inhibitor. -The discussed her restaging CT scan results, she has had very good partial response, no new lesions. Her tumor marker CEA has also decreased significantly, corresponding to the response to chemotherapy. -Lab reviewed, mild thrombocytopenia, Adequate for Chemotherapy, Will Proceed with Cycle 5 Today.  2. Anemia -Likely secondary to tumor bleeding and iron deficiency -Her ferritin is normal, serum iron and saturation slightly low, TIBC low normal, I suggest her to take oral iron supplement, she tolerates well, we'll continue once daily, with orange juice or vitamin C   3. DM, HTN -Continue follow-up with PCP - we discussed the potential impact of chemotherapy and premedication dexamethasone on her blood pressure and sugar,will  Monitor closely. -Her BP is  high today, possible related to Avastin, will monitor closely, adjust meds if needed   4. AKI -Her Cr has been slowly increasing, 1.5 today -I will give her IVF today -I told her do not use NSAIDs, and will avoid IV contrast in future -meds reviewed, she is not on ACE or other renal toxic medications   PLan -cycle 5 FOLFOX and avastin today  -IVF today  -RTC in 2 weeks    All questions were answered. The patient knows to call the clinic with Michelle problems, questions or concerns. I spent 20 minutes counseling the patient face to face. The total time spent in the appointment was 30 minutes and more than 50% was on counseling.     Truitt Merle, MD 10/12/2014 10:26 AM

## 2014-10-12 NOTE — Patient Instructions (Signed)
[facility] Discharge Instructions for Patients Receiving Chemotherapy  Today you received the following chemotherapy agents Avastin, Oxaliplatin, Leucovorin and Adrucil.  To help prevent nausea and vomiting after your treatment, we encourage you to take your nausea medication as prescribed.   If you develop nausea and vomiting that is not controlled by your nausea medication, call the clinic.   BELOW ARE SYMPTOMS THAT SHOULD BE REPORTED IMMEDIATELY:  *FEVER GREATER THAN 100.5 F  *CHILLS WITH OR WITHOUT FEVER  NAUSEA AND VOMITING THAT IS NOT CONTROLLED WITH YOUR NAUSEA MEDICATION  *UNUSUAL SHORTNESS OF BREATH  *UNUSUAL BRUISING OR BLEEDING  TENDERNESS IN MOUTH AND THROAT WITH OR WITHOUT PRESENCE OF ULCERS  *URINARY PROBLEMS  *BOWEL PROBLEMS  UNUSUAL RASH Items with * indicate a potential emergency and should be followed up as soon as possible.  Feel free to call the clinic you have any questions or concerns. The clinic phone number is [phone].  Please show the CHEMO ALERT CARD at check-in to the Emergency Department and triage nurse.   

## 2014-10-14 ENCOUNTER — Ambulatory Visit (HOSPITAL_BASED_OUTPATIENT_CLINIC_OR_DEPARTMENT_OTHER): Payer: BLUE CROSS/BLUE SHIELD

## 2014-10-14 VITALS — BP 150/74 | HR 93 | Temp 98.7°F

## 2014-10-14 DIAGNOSIS — C787 Secondary malignant neoplasm of liver and intrahepatic bile duct: Secondary | ICD-10-CM | POA: Diagnosis not present

## 2014-10-14 DIAGNOSIS — C189 Malignant neoplasm of colon, unspecified: Secondary | ICD-10-CM

## 2014-10-14 MED ORDER — HEPARIN SOD (PORK) LOCK FLUSH 100 UNIT/ML IV SOLN
500.0000 [IU] | Freq: Once | INTRAVENOUS | Status: AC | PRN
  Administered 2014-10-14: 500 [IU]
  Filled 2014-10-14: qty 5

## 2014-10-14 MED ORDER — SODIUM CHLORIDE 0.9 % IJ SOLN
10.0000 mL | INTRAMUSCULAR | Status: DC | PRN
  Administered 2014-10-14: 10 mL
  Filled 2014-10-14: qty 10

## 2014-10-14 NOTE — Progress Notes (Signed)
5.6 ml of 5 FU left in pump- medication infused.

## 2014-10-25 ENCOUNTER — Telehealth: Payer: Self-pay | Admitting: Nurse Practitioner

## 2014-10-25 NOTE — Telephone Encounter (Signed)
per pof to move this pt to [place] per Ned Card

## 2014-10-26 ENCOUNTER — Ambulatory Visit (HOSPITAL_BASED_OUTPATIENT_CLINIC_OR_DEPARTMENT_OTHER): Payer: BLUE CROSS/BLUE SHIELD | Admitting: Hematology

## 2014-10-26 ENCOUNTER — Encounter: Payer: Self-pay | Admitting: Hematology

## 2014-10-26 ENCOUNTER — Other Ambulatory Visit (HOSPITAL_BASED_OUTPATIENT_CLINIC_OR_DEPARTMENT_OTHER): Payer: BLUE CROSS/BLUE SHIELD

## 2014-10-26 ENCOUNTER — Ambulatory Visit (HOSPITAL_BASED_OUTPATIENT_CLINIC_OR_DEPARTMENT_OTHER): Payer: BLUE CROSS/BLUE SHIELD

## 2014-10-26 VITALS — BP 173/67 | HR 81 | Temp 98.1°F | Resp 18

## 2014-10-26 DIAGNOSIS — E119 Type 2 diabetes mellitus without complications: Secondary | ICD-10-CM

## 2014-10-26 DIAGNOSIS — D649 Anemia, unspecified: Secondary | ICD-10-CM

## 2014-10-26 DIAGNOSIS — N179 Acute kidney failure, unspecified: Secondary | ICD-10-CM

## 2014-10-26 DIAGNOSIS — C189 Malignant neoplasm of colon, unspecified: Secondary | ICD-10-CM

## 2014-10-26 DIAGNOSIS — C187 Malignant neoplasm of sigmoid colon: Secondary | ICD-10-CM

## 2014-10-26 DIAGNOSIS — C787 Secondary malignant neoplasm of liver and intrahepatic bile duct: Secondary | ICD-10-CM | POA: Diagnosis not present

## 2014-10-26 DIAGNOSIS — Z5111 Encounter for antineoplastic chemotherapy: Secondary | ICD-10-CM | POA: Diagnosis not present

## 2014-10-26 DIAGNOSIS — Z5112 Encounter for antineoplastic immunotherapy: Secondary | ICD-10-CM | POA: Diagnosis not present

## 2014-10-26 DIAGNOSIS — Z23 Encounter for immunization: Secondary | ICD-10-CM

## 2014-10-26 DIAGNOSIS — I10 Essential (primary) hypertension: Secondary | ICD-10-CM

## 2014-10-26 LAB — CBC WITH DIFFERENTIAL/PLATELET
BASO%: 0.6 % (ref 0.0–2.0)
BASOS ABS: 0 10*3/uL (ref 0.0–0.1)
EOS%: 6.4 % (ref 0.0–7.0)
Eosinophils Absolute: 0.3 10*3/uL (ref 0.0–0.5)
HCT: 29.7 % — ABNORMAL LOW (ref 34.8–46.6)
HGB: 9.5 g/dL — ABNORMAL LOW (ref 11.6–15.9)
LYMPH%: 20.6 % (ref 14.0–49.7)
MCH: 27.4 pg (ref 25.1–34.0)
MCHC: 32.1 g/dL (ref 31.5–36.0)
MCV: 85.5 fL (ref 79.5–101.0)
MONO#: 0.7 10*3/uL (ref 0.1–0.9)
MONO%: 14.3 % — AB (ref 0.0–14.0)
NEUT#: 2.9 10*3/uL (ref 1.5–6.5)
NEUT%: 58.1 % (ref 38.4–76.8)
Platelets: 124 10*3/uL — ABNORMAL LOW (ref 145–400)
RBC: 3.47 10*6/uL — AB (ref 3.70–5.45)
RDW: 22.3 % — ABNORMAL HIGH (ref 11.2–14.5)
WBC: 5 10*3/uL (ref 3.9–10.3)
lymph#: 1 10*3/uL (ref 0.9–3.3)

## 2014-10-26 LAB — CEA: CEA: 54.6 ng/mL — ABNORMAL HIGH (ref 0.0–5.0)

## 2014-10-26 LAB — COMPREHENSIVE METABOLIC PANEL (CC13)
ALBUMIN: 3.7 g/dL (ref 3.5–5.0)
ALK PHOS: 117 U/L (ref 40–150)
ALT: 29 U/L (ref 0–55)
ANION GAP: 6 meq/L (ref 3–11)
AST: 22 U/L (ref 5–34)
BUN: 17.8 mg/dL (ref 7.0–26.0)
CO2: 24 meq/L (ref 22–29)
CREATININE: 1 mg/dL (ref 0.6–1.1)
Calcium: 9.5 mg/dL (ref 8.4–10.4)
Chloride: 109 mEq/L (ref 98–109)
EGFR: 68 mL/min/{1.73_m2} — ABNORMAL LOW (ref >90)
Glucose: 132 mg/dl (ref 70–140)
Potassium: 4.5 mEq/L (ref 3.5–5.1)
Sodium: 139 mEq/L (ref 136–145)
TOTAL PROTEIN: 7.1 g/dL (ref 6.4–8.3)

## 2014-10-26 LAB — UA PROTEIN, DIPSTICK - CHCC: PROTEIN: 30 mg/dL

## 2014-10-26 MED ORDER — SODIUM CHLORIDE 0.9 % IV SOLN
2400.0000 mg/m2 | INTRAVENOUS | Status: DC
  Administered 2014-10-26: 4300 mg via INTRAVENOUS
  Filled 2014-10-26: qty 86

## 2014-10-26 MED ORDER — SODIUM CHLORIDE 0.9 % IV SOLN
Freq: Once | INTRAVENOUS | Status: AC
  Administered 2014-10-26: 10:00:00 via INTRAVENOUS

## 2014-10-26 MED ORDER — DEXTROSE 5 % IV SOLN
Freq: Once | INTRAVENOUS | Status: AC
  Administered 2014-10-26: 11:00:00 via INTRAVENOUS

## 2014-10-26 MED ORDER — OXALIPLATIN CHEMO INJECTION 100 MG/20ML
85.0000 mg/m2 | Freq: Once | INTRAVENOUS | Status: AC
  Administered 2014-10-26: 150 mg via INTRAVENOUS
  Filled 2014-10-26: qty 10

## 2014-10-26 MED ORDER — INFLUENZA VAC SPLIT QUAD 0.5 ML IM SUSY
0.5000 mL | PREFILLED_SYRINGE | Freq: Once | INTRAMUSCULAR | Status: DC
  Filled 2014-10-26: qty 0.5

## 2014-10-26 MED ORDER — LEUCOVORIN CALCIUM INJECTION 350 MG
400.0000 mg/m2 | Freq: Once | INTRAVENOUS | Status: AC
  Administered 2014-10-26: 716 mg via INTRAVENOUS
  Filled 2014-10-26: qty 35.8

## 2014-10-26 MED ORDER — SODIUM CHLORIDE 0.9 % IV SOLN
Freq: Once | INTRAVENOUS | Status: AC
  Administered 2014-10-26: 10:00:00 via INTRAVENOUS
  Filled 2014-10-26: qty 4

## 2014-10-26 MED ORDER — BEVACIZUMAB CHEMO INJECTION 400 MG/16ML
5.0000 mg/kg | Freq: Once | INTRAVENOUS | Status: AC
  Administered 2014-10-26: 350 mg via INTRAVENOUS
  Filled 2014-10-26: qty 14

## 2014-10-26 NOTE — Patient Instructions (Signed)
  [place] Discharge Instructions for Patients Receiving Chemotherapy  Today you received the following chemotherapy agents Avastin/Oxaliplatin/Leucorvorin/5 FU  To help prevent nausea and vomiting after your treatment, we encourage you to take your nausea medication as prescribed. If you develop nausea and vomiting that is not controlled by your nausea medication, call the clinic.   BELOW ARE SYMPTOMS THAT SHOULD BE REPORTED IMMEDIATELY:  *FEVER GREATER THAN 100.5 F  *CHILLS WITH OR WITHOUT FEVER  NAUSEA AND VOMITING THAT IS NOT CONTROLLED WITH YOUR NAUSEA MEDICATION  *UNUSUAL SHORTNESS OF BREATH  *UNUSUAL BRUISING OR BLEEDING  TENDERNESS IN MOUTH AND THROAT WITH OR WITHOUT PRESENCE OF ULCERS  *URINARY PROBLEMS  *BOWEL PROBLEMS  UNUSUAL RASH Items with * indicate a potential emergency and should be followed up as soon as possible.  Feel free to call the clinic you have any questions or concerns. The clinic phone number is [phone].  Please show the [place] at check-in to the Emergency Department and triage nurse.

## 2014-10-26 NOTE — Addendum Note (Signed)
Addended by: Jesse Fall on: 10/26/2014 09:51 AM   Modules accepted: Orders

## 2014-10-26 NOTE — Progress Notes (Signed)
[name]  [phone] [phone]  Clinic Follow Up Note   Patient Care Team: Dione Housekeeper, MD as PCP - General (Family Medicine) Tania Ade, RN as Registered Nurse (Oncology) 10/26/2014  CHIEF COMPLAINTS:  Follow-up of metastatic colon cancer  Oncology History   Metastatic colon cancer to liver   Staging form: Colon and Rectum, AJCC 7th Edition     Clinical: Stage Unknown ([place], NX, M1) - Unsigned        Metastatic colon cancer to liver ([place])   07/14/2014 Imaging CT abdomen and pelvis with contrast showed a 6 cm segment of sigmoid bowel wall thickening with adjacent 14 x 12 mm nodules, diffuse hepatic metastasis. No bowel obstruction.   07/14/2014 Initial Diagnosis Metastatic colon cancer to liver   07/25/2014 Procedure Colonoscopy showed a near circumferential medial mass in the sigmoid colon, partially obstructing consistent with carcinoma.   07/25/2014 Initial Biopsy Sigmoid: Biopsy showed invasive adenocarcinoma arising in a background of high-grade dysplasia.   07/28/2014 Imaging CT chest showed no evidence of metastasis   08/09/2014 -  Chemotherapy FOLFOX every 2 weeks    08/11/2014 - 08/15/2014 Hospital Admission Pt was admitted for fever and nausea, treated for UTI, liver biopsy cancelled due to the infection     HISTORY OF PRESENTING ILLNESS:  Michelle Erickson 59 y.o. female is here because of abnoaml CT findings which is highly suspecious for metastatic colon cancer.   She presented intermittent nausea, anorexia and abdominal pain since Nov 2015, she has mild pain in the LUQ, crapy pain, positional (bending over),   it happened once every 2-3 weeks, and it has been more frequent in the past month. She has normal BM, but did notice the caliber of the stool is smaller than before. She denies any melena or hematochezia. She lost about 13 lbs in the past 6 months.   She presents to local emergency room  in November 2015, and May 2016, was felt to be related to gastric virus. Due to the worsening symptoms lately, she went to [facility] ED on 07/13/2014, CT abdomen was obtained which showed multiple large liver metastasis and probable sigmoid colon mass. She was referred to [place] for further workup. She is scheduled to see GI Dr. Olevia Perches this afternoon.  INTERIM HISTORY Michelle Erickson returns for follow-up and 6th cycle chemo. She tolerated the last cycle chemotherapy very well, no significant side effects, no fever or chills. She had some issues with the dressing on the port last time, and she developed skin erythema. It resolved afterwards. She is eating well, tries to drink more fluids. Weight is stable.  MEDICAL HISTORY:  Past Medical History  Diagnosis Date  . Hyperlipemia   . Hypertension   . Metastatic colon cancer to liver ([place]) 07/21/2014  . Wears glasses     contacts/glasses  . Snoring   . Back pain   . Family history of colon cancer   . Diabetes mellitus without complication ([place])     SURGICAL HISTORY: Past Surgical History  Procedure Laterality Date  . Mouth surgery    . Cataract extraction Bilateral   . Portacath placement N/A 08/03/2014    Procedure: INSERTION PORT-A-CATH;  Surgeon: Stark Klein, MD;  Location: [facility];  Service: General;  Laterality: N/A;    SOCIAL HISTORY: Social History   Social History  . Marital Status: Married    Spouse Name: Charlotte Crumb  . Number of Children: 2  . Years of Education: N/A  Occupational History  . Not on file.   Social History Main Topics  . Smoking status: Former Smoker -- 1.00 packs/day for 6 years    Types: Cigarettes    Quit date: 01/21/1976  . Smokeless tobacco: Not on file  . Alcohol Use: No  . Drug Use: No  . Sexual Activity: Yes    Birth Control/ Protection: Post-menopausal     Comment: # 2 pregnancies and #2 live births   Other Topics Concern  . Not on file   Social History Narrative   Married, husband [name]    Has #2 adult children   Previously worked at [employer] HISTORY: Family History  Problem Relation Age of Onset  . COPD Father   . Heart attack Father   . Arthritis Sister   . Diabetes Sister   . Colon cancer Sister     dx over 43  . Colon polyps Sister   . Seizures Brother   . Colon cancer Sister     dx in her early 30s, died in her early to mid 86s  . Diabetes Mother     ALLERGIES:  has No Known Allergies.  MEDICATIONS:  Current Outpatient Prescriptions  Medication Sig Dispense Refill  . amLODipine (NORVASC) 5 MG tablet Take 2.5 mg by mouth daily.     Marland Kitchen atorvastatin (LIPITOR) 40 MG tablet Take 40 mg by mouth daily.    . cetirizine (ZYRTEC) 10 MG tablet Take 10 mg by mouth daily as needed for allergies.    . ferrous sulfate 325 (65 FE) MG tablet Take 325 mg by mouth daily with breakfast.    . Insulin Glargine (LANTUS SOLOSTAR) 100 UNIT/ML Solostar Pen Inject 23 Units into the skin daily at 10 pm.    . lidocaine-prilocaine (EMLA) cream     . ondansetron (ZOFRAN-ODT) 8 MG disintegrating tablet Take 8 mg by mouth every 8 (eight) hours as needed for nausea or vomiting.    . insulin lispro (HUMALOG) 100 UNIT/ML injection Inject 4-8 Units into the skin 2 (two) times daily as needed for high blood sugar. Sliding scale     No current facility-administered medications for this visit.    REVIEW OF SYSTEMS:   Constitutional: Denies fevers, chills or abnormal night sweats Eyes: Denies blurriness of vision, double vision or watery eyes Ears, nose, mouth, throat, and face: Denies mucositis or sore throat Respiratory: Denies cough, dyspnea or wheezes Cardiovascular: Denies palpitation, chest discomfort or lower extremity swelling Gastrointestinal:  Denies nausea, heartburn or change in bowel habits Skin: Denies abnormal skin rashes Lymphatics: Denies new lymphadenopathy or easy bruising Neurological:Denies numbness, tingling or new weaknesses Behavioral/Psych: Mood  is stable, no new changes  All other systems were reviewed with the patient and are negative.  PHYSICAL EXAMINATION: ECOG PERFORMANCE STATUS: 1 - Symptomatic but completely ambulatory Weight once 56 pounds, blood pressure 163/76, heart rate 99, respirator rate 18, temperature 98.4, oxygen saturation 100% on room air GENERAL:alert, no distress and comfortable SKIN: skin color, texture, turgor are normal, no rashes or significant lesions EYES: normal, conjunctiva are pink and non-injected, sclera clear OROPHARYNX:no exudate, no erythema and lips, buccal mucosa, and tongue normal  NECK: supple, thyroid normal size, non-tender, without nodularity LYMPH:  no palpable lymphadenopathy in the cervical, axillary or inguinal LUNGS: clear to auscultation and percussion with normal breathing effort HEART: regular rate & rhythm and no murmurs and no lower extremity edema ABDOMEN:abdomen soft, non-tender and normal bowel sounds Musculoskeletal:no cyanosis  of digits and no clubbing  PSYCH: alert & oriented x 3 with fluent speech NEURO: no focal motor/sensory deficits  LABORATORY DATA:  I have reviewed the data as listed CBC Latest Ref Rng 10/26/2014 10/12/2014 09/28/2014  WBC 3.9 - 10.3 10e3/uL 5.0 17.5(H) 13.6(H)  Hemoglobin 11.6 - 15.9 g/dL 9.5(L) 9.1(L) 9.4(L)  Hematocrit 34.8 - 46.6 % 29.7(L) 28.5(L) 29.2(L)  Platelets 145 - 400 10e3/uL 124(L) 123(L) 107(L)     Recent Labs  07/13/14 2051  08/11/14 2047 08/13/14 0558 08/14/14 0435  09/28/14 0913 10/12/14 0813 10/26/14 0853  NA  --   < > 133* 138 135  < > 136 141 139  K  --   < > 4.3 4.0 4.0  < > 4.7 4.4 4.5  CL  --   < > 98* 109 105  --   --   --   --   CO2  --   < > $R'26 26 25  'os$ < > $R'24 26 24  'kx$ GLUCOSE  --   < > 97 110* 138*  < > 355* 188* 132  BUN  --   < > 22* 18 18  < > 21.8 13.9 17.8  CREATININE  --   < > 0.98 1.04* 0.94  < > 1.3* 1.5* 1.0  CALCIUM  --   < > 8.8* 8.2* 8.3*  < > 9.4 9.1 9.5  GFRNONAA  --   < > >60 57* >60  --   --   --    --   GFRAA  --   < > >60 >60 >60  --   --   --   --   PROT 7.6  < > 7.6 5.9* 6.5  < > 6.8 6.5 7.1  ALBUMIN 3.8  < > 3.7 2.7* 2.9*  < > 3.6 3.6 3.7  AST 65*  < > 70* 31 30  < > 30 36* 22  ALT 54  < > 51 28 28  < > 31 38 29  ALKPHOS 146*  < > 178* 134* 146*  < > 148 181* 117  BILITOT 0.9  < > 0.7 1.0 0.7  < > 0.35 <0.30 <0.30  BILIDIR 0.2  --   --   --  0.1  --   --   --   --   IBILI 0.7  --   --   --  0.6  --   --   --   --   < > = values in this interval not displayed.  PATHOLOGY REPORTS: Diagnosis 07/25/2014  Surgical [P], sigmoid - INVASIVE ADENOCARCINOMA ARISING IN A BACKGROUND OF HIGH GRADE DYSPLASIA. - SEE COMMENT. Microscopic Comment Results are phoned to Dr. Olevia Perches. Internal departmental review obtained (Dr. Donato Heinz) with agreement.  Diagnosis 09/05/2014 Liver, biopsy - METASTATIC ADENOCARCINOMA, SEE COMMENT. Microscopic Comment Given the patient's history of sigmoid invasive adenocarcinoma and the morphology, the findings are consistent with metastatic colorectal adenocarcinoma. The case was called to Dr. Burr Medico. Per request, MMR, MSI, and KRAS testing will be ordered.  KRAS MUTATION (+) c36G>A (p. G12D)  RADIOGRAPHIC STUDIES: I have personally reviewed the radiological images as listed and agreed with the findings in the report.   CT chest, abdomen and pelvis with contrast 10/10/2014 IMPRESSION: Interval decrease in size of multiple hepatic metastatic lesions.  Interval decrease in size of soft tissue mass adjacent to the sigmoid colon.  Interval decrease in wall thickening of the sigmoid colon.   Colonoscopy 07/25/2014 Dr. Olevia Perches  ENDOSCOPIC IMPRESSION: Near circumferential medium mass was found in the sigmoid colon; multiple biopsies were performed using cold forceps, partially obstructing mass consistent with carcinoma. Placement of endoclips and tattoo at the margins of the mass which extends from 18-25 cm from the anus  ASSESSMENT & PLAN: 59 year old  African-American female, presented with intermittent nausea vomiting and anemia.   1. Sigmoid colon cancer with liver metastasis, TxNxM1, stage IV, MSI stable, KRAS mutation(+) -I reviewed her CT scans, colonoscopy, and colon mass biopsy findings with patient and her husband in great details, images were reviewed in person  -I reviewed her liver biopsy results, which confirmed metastasis from colon cancer. -The natural history of metastatic colon cancer and treatment options were discussed with patient and her husband. Giving the diffuse metastasis in the liver, this is unfortunately incurable disease. I recommend systemic chemotherapy to control her metastatic disease, the goal of therapy is palliative and to prolong her life. -The options of first-line chemotherapy were discussed with her, I recommend FOLFOX and Avastin every 2 weeks intravenously, and I will continue as long as she tolerates and no disease progression -her tumor MSI is stable, KRAS mutated, not a candidate for immunotherapy or EGFR inhibitor. -She is tolerating chemotherapy well, CEA has dropped significantly, restaging CT scan showed partial response. We'll continue chemotherapy  -Lab reviewed, mild thrombocytopenia, Adequate for Chemotherapy, Will Proceed with Cycle 6 today.  2. Anemia -Likely secondary to tumor bleeding and iron deficiency -Her ferritin is normal, serum iron and saturation slightly low, TIBC low normal, I suggest her to take oral iron supplement, she tolerates well, we'll continue once daily, with orange juice or vitamin C   3. DM, HTN -Continue follow-up with PCP - we discussed the potential impact of chemotherapy and premedication dexamethasone on her blood pressure and sugar,will  Monitor closely. -Her blood pressure has been high lately, I recommend her to go up the dose of amlodipine from 2.5 to $Remo'5mg'iZpsz$  daily   4. AKI -Her cranial was 1.52 weeks ago, I give her IV hydration, down to 1.0 today  - I  encouraged her to continue oral hydration at home.  PLan -cycle 6 FOLFOX plus Avastin today, no Neulasta on day 3 - Flu shot on day 3 -she will see nurse practitioner Sarah in 2 weeks, I'll see her back in 4 weeks  All questions were answered. The patient knows to call the clinic with any problems, questions or concerns. I spent 20 minutes counseling the patient face to face. The total time spent in the appointment was 30 minutes and more than 50% was on counseling.     Truitt Merle, MD 10/26/2014 9:39 AM

## 2014-10-28 ENCOUNTER — Ambulatory Visit: Payer: BLUE CROSS/BLUE SHIELD

## 2014-10-28 ENCOUNTER — Ambulatory Visit (HOSPITAL_BASED_OUTPATIENT_CLINIC_OR_DEPARTMENT_OTHER): Payer: BLUE CROSS/BLUE SHIELD

## 2014-10-28 VITALS — BP 139/60 | HR 90 | Temp 98.4°F | Resp 18

## 2014-10-28 DIAGNOSIS — C187 Malignant neoplasm of sigmoid colon: Secondary | ICD-10-CM | POA: Diagnosis not present

## 2014-10-28 DIAGNOSIS — C787 Secondary malignant neoplasm of liver and intrahepatic bile duct: Principal | ICD-10-CM

## 2014-10-28 DIAGNOSIS — Z23 Encounter for immunization: Secondary | ICD-10-CM | POA: Diagnosis not present

## 2014-10-28 DIAGNOSIS — C189 Malignant neoplasm of colon, unspecified: Secondary | ICD-10-CM

## 2014-10-28 MED ORDER — HEPARIN SOD (PORK) LOCK FLUSH 100 UNIT/ML IV SOLN
500.0000 [IU] | Freq: Once | INTRAVENOUS | Status: AC | PRN
  Administered 2014-10-28: 500 [IU]
  Filled 2014-10-28: qty 5

## 2014-10-28 MED ORDER — SODIUM CHLORIDE 0.9 % IJ SOLN
10.0000 mL | INTRAMUSCULAR | Status: DC | PRN
  Administered 2014-10-28: 10 mL
  Filled 2014-10-28: qty 10

## 2014-10-28 MED ORDER — INFLUENZA VAC SPLIT QUAD 0.5 ML IM SUSY
0.5000 mL | PREFILLED_SYRINGE | Freq: Once | INTRAMUSCULAR | Status: AC
  Administered 2014-10-28: 0.5 mL via INTRAMUSCULAR

## 2014-10-28 NOTE — Progress Notes (Signed)
Flu shot given, see documentation in pump d/c encounter

## 2014-11-09 ENCOUNTER — Other Ambulatory Visit (HOSPITAL_BASED_OUTPATIENT_CLINIC_OR_DEPARTMENT_OTHER): Payer: BLUE CROSS/BLUE SHIELD

## 2014-11-09 ENCOUNTER — Encounter: Payer: Self-pay | Admitting: Physician Assistant

## 2014-11-09 ENCOUNTER — Other Ambulatory Visit: Payer: BLUE CROSS/BLUE SHIELD

## 2014-11-09 ENCOUNTER — Ambulatory Visit (HOSPITAL_BASED_OUTPATIENT_CLINIC_OR_DEPARTMENT_OTHER): Payer: BLUE CROSS/BLUE SHIELD | Admitting: Physician Assistant

## 2014-11-09 ENCOUNTER — Ambulatory Visit: Payer: BLUE CROSS/BLUE SHIELD | Admitting: Nurse Practitioner

## 2014-11-09 ENCOUNTER — Ambulatory Visit (HOSPITAL_BASED_OUTPATIENT_CLINIC_OR_DEPARTMENT_OTHER): Payer: BLUE CROSS/BLUE SHIELD

## 2014-11-09 VITALS — BP 170/76 | HR 85

## 2014-11-09 VITALS — BP 162/71 | HR 94 | Temp 98.2°F | Resp 18 | Ht 64.0 in | Wt 153.8 lb

## 2014-11-09 DIAGNOSIS — C189 Malignant neoplasm of colon, unspecified: Secondary | ICD-10-CM

## 2014-11-09 DIAGNOSIS — C787 Secondary malignant neoplasm of liver and intrahepatic bile duct: Secondary | ICD-10-CM

## 2014-11-09 DIAGNOSIS — N2889 Other specified disorders of kidney and ureter: Secondary | ICD-10-CM

## 2014-11-09 DIAGNOSIS — C187 Malignant neoplasm of sigmoid colon: Secondary | ICD-10-CM | POA: Diagnosis not present

## 2014-11-09 DIAGNOSIS — I151 Hypertension secondary to other renal disorders: Secondary | ICD-10-CM

## 2014-11-09 DIAGNOSIS — D630 Anemia in neoplastic disease: Secondary | ICD-10-CM

## 2014-11-09 DIAGNOSIS — Z800 Family history of malignant neoplasm of digestive organs: Secondary | ICD-10-CM

## 2014-11-09 DIAGNOSIS — Z5112 Encounter for antineoplastic immunotherapy: Secondary | ICD-10-CM

## 2014-11-09 DIAGNOSIS — E11649 Type 2 diabetes mellitus with hypoglycemia without coma: Secondary | ICD-10-CM | POA: Diagnosis not present

## 2014-11-09 DIAGNOSIS — Z1379 Encounter for other screening for genetic and chromosomal anomalies: Secondary | ICD-10-CM

## 2014-11-09 LAB — COMPREHENSIVE METABOLIC PANEL (CC13)
ALBUMIN: 3.9 g/dL (ref 3.5–5.0)
ALT: 34 U/L (ref 0–55)
AST: 26 U/L (ref 5–34)
Alkaline Phosphatase: 110 U/L (ref 40–150)
Anion Gap: 7 mEq/L (ref 3–11)
BUN: 16.4 mg/dL (ref 7.0–26.0)
CALCIUM: 9.8 mg/dL (ref 8.4–10.4)
CHLORIDE: 110 meq/L — AB (ref 98–109)
CO2: 26 mEq/L (ref 22–29)
CREATININE: 1.1 mg/dL (ref 0.6–1.1)
EGFR: 65 mL/min/{1.73_m2} — ABNORMAL LOW (ref >90)
Glucose: 96 mg/dl (ref 70–140)
Potassium: 4.4 mEq/L (ref 3.5–5.1)
Sodium: 143 mEq/L (ref 136–145)
TOTAL PROTEIN: 7.4 g/dL (ref 6.4–8.3)

## 2014-11-09 LAB — CBC WITH DIFFERENTIAL/PLATELET
BASO%: 0.8 % (ref 0.0–2.0)
Basophils Absolute: 0 10*3/uL (ref 0.0–0.1)
EOS%: 5.9 % (ref 0.0–7.0)
Eosinophils Absolute: 0.3 10*3/uL (ref 0.0–0.5)
HEMATOCRIT: 30.3 % — AB (ref 34.8–46.6)
HEMOGLOBIN: 9.8 g/dL — AB (ref 11.6–15.9)
LYMPH%: 26.7 % (ref 14.0–49.7)
MCH: 28.4 pg (ref 25.1–34.0)
MCHC: 32.4 g/dL (ref 31.5–36.0)
MCV: 87.7 fL (ref 79.5–101.0)
MONO#: 0.7 10*3/uL (ref 0.1–0.9)
MONO%: 15.6 % — ABNORMAL HIGH (ref 0.0–14.0)
NEUT%: 51 % (ref 38.4–76.8)
NEUTROS ABS: 2.3 10*3/uL (ref 1.5–6.5)
PLATELETS: 95 10*3/uL — AB (ref 145–400)
RBC: 3.45 10*6/uL — ABNORMAL LOW (ref 3.70–5.45)
RDW: 22 % — AB (ref 11.2–14.5)
WBC: 4.6 10*3/uL (ref 3.9–10.3)
lymph#: 1.2 10*3/uL (ref 0.9–3.3)

## 2014-11-09 MED ORDER — SODIUM CHLORIDE 0.9 % IV SOLN
5.0000 mg/kg | Freq: Once | INTRAVENOUS | Status: AC
  Administered 2014-11-09: 350 mg via INTRAVENOUS
  Filled 2014-11-09: qty 14

## 2014-11-09 MED ORDER — SODIUM CHLORIDE 0.9 % IV SOLN
Freq: Once | INTRAVENOUS | Status: AC
  Administered 2014-11-09: 10:00:00 via INTRAVENOUS
  Filled 2014-11-09: qty 4

## 2014-11-09 MED ORDER — DEXTROSE 5 % IV SOLN
Freq: Once | INTRAVENOUS | Status: AC
  Administered 2014-11-09: 10:00:00 via INTRAVENOUS

## 2014-11-09 MED ORDER — SODIUM CHLORIDE 0.9 % IV SOLN
2200.0000 mg/m2 | INTRAVENOUS | Status: DC
  Administered 2014-11-09: 3950 mg via INTRAVENOUS
  Filled 2014-11-09: qty 79

## 2014-11-09 MED ORDER — HEPARIN SOD (PORK) LOCK FLUSH 100 UNIT/ML IV SOLN
500.0000 [IU] | Freq: Once | INTRAVENOUS | Status: DC | PRN
Start: 2014-11-09 — End: 2014-11-09
  Filled 2014-11-09: qty 5

## 2014-11-09 MED ORDER — SODIUM CHLORIDE 0.9 % IJ SOLN
10.0000 mL | INTRAMUSCULAR | Status: DC | PRN
  Filled 2014-11-09: qty 10

## 2014-11-09 MED ORDER — LEUCOVORIN CALCIUM INJECTION 350 MG
402.0000 mg/m2 | Freq: Once | INTRAVENOUS | Status: AC
  Administered 2014-11-09: 720 mg via INTRAVENOUS
  Filled 2014-11-09: qty 36

## 2014-11-09 MED ORDER — OXALIPLATIN CHEMO INJECTION 100 MG/20ML
80.0000 mg/m2 | Freq: Once | INTRAVENOUS | Status: AC
  Administered 2014-11-09: 145 mg via INTRAVENOUS
  Filled 2014-11-09: qty 29

## 2014-11-09 NOTE — Patient Instructions (Addendum)
[facility] Discharge Instructions for Patients Receiving Chemotherapy  Today you received the following chemotherapy agents Avastin/Oxaliplatin/Leucovorin/Fluorouracil.  To help prevent nausea and vomiting after your treatment, we encourage you to take your nausea medication as directed.   If you develop nausea and vomiting that is not controlled by your nausea medication, call the clinic.   BELOW ARE SYMPTOMS THAT SHOULD BE REPORTED IMMEDIATELY:  *FEVER GREATER THAN 100.5 F  *CHILLS WITH OR WITHOUT FEVER  NAUSEA AND VOMITING THAT IS NOT CONTROLLED WITH YOUR NAUSEA MEDICATION  *UNUSUAL SHORTNESS OF BREATH  *UNUSUAL BRUISING OR BLEEDING  TENDERNESS IN MOUTH AND THROAT WITH OR WITHOUT PRESENCE OF ULCERS  *URINARY PROBLEMS  *BOWEL PROBLEMS  UNUSUAL RASH Items with * indicate a potential emergency and should be followed up as soon as possible.  Feel free to call the clinic you have any questions or concerns. The clinic phone number is [phone].  Please show the [place] [facility] at check-in to the Emergency Department and triage nurse.  **Take two 5mg  Norvasc tablets every morning (10mg  total) for high blood pressure per Dr. Burr Medico**

## 2014-11-09 NOTE — Progress Notes (Signed)
Okay to treat with plts 95,000 per MD visit note from today.  Pt BP after Avastin found to be elevated.  Looking back through pt history pt baseline is elevated.  Notified Burr Medico, MD who gave order for pt to take 10mg  Norvasc daily as opposed to current 5mg .  Pt notified and verbalized understanding.  Directions included in pt AVS.

## 2014-11-09 NOTE — Progress Notes (Signed)
[place]  Telephone:(336) [phone] Fax:(336) [phone]  Clinic Follow Up Note   Patient Care Team: Dione Housekeeper, MD as PCP - General (Family Medicine) Tania Ade, RN as Registered Nurse (Oncology) 11/09/2014  CHIEF COMPLAINTS:  Follow-up of metastatic colon cancer  Oncology History   Metastatic colon cancer to liver   Staging form: Colon and Rectum, AJCC 7th Edition     Clinical: Stage Unknown ([place], NX, M1) - Unsigned        Metastatic colon cancer to liver ([place])   07/14/2014 Imaging CT abdomen and pelvis with contrast showed a 6 cm segment of sigmoid bowel wall thickening with adjacent 14 x 12 mm nodules, diffuse hepatic metastasis. No bowel obstruction.   07/14/2014 Initial Diagnosis Metastatic colon cancer to liver   07/25/2014 Procedure Colonoscopy showed a near circumferential medial mass in the sigmoid colon, partially obstructing consistent with carcinoma.   07/25/2014 Initial Biopsy Sigmoid: Biopsy showed invasive adenocarcinoma arising in a background of high-grade dysplasia.   07/28/2014 Imaging CT chest showed no evidence of metastasis   08/09/2014 -  Chemotherapy FOLFOX every 2 weeks    08/11/2014 - 08/15/2014 Hospital Admission Pt was admitted for fever and nausea, treated for UTI, liver biopsy cancelled due to the infection     HISTORY OF PRESENTING ILLNESS:  Michelle Erickson 59 y.o. female is here because of abnormal CT findings which is highly suspicious for metastatic colon cancer.   She presented intermittent nausea, anorexia and abdominal pain since Nov 2015, she has mild pain in the LUQ, crampy pain, positional (bending over), it happened once every 2-3 weeks, and it has been more frequent in one month. She has normal BM, but did notice the caliber of the stool is smaller than before. She denies any melena or hematochezia.She lost about 13 lbs in the past 6 months.   She presents to local emergency room in  November 2015, and May 2016, was felt to be related to gastric virus. Due to the worsening symptoms lately, she [facility] ED [facility]13[facility]2014, CT abdomen was obtained which showed multiple large liver metastasis and probable sigmoid colon mass. She was referred to [place] for further workup.    INTERIM HISTORY Michelle Erickson returns for follow-up and 7th cycle chemo. She tolerated the last cycle chemotherapy very well, no significant side effects, no fever or chills. She is eating well, tries to drink more fluids. Weight is stable. She had her flu shot on 10/8. She denies any respiratory or cardiac complaints. She denies any nausea, vomiting or diarrhea. No neuropathy. No bleeding issues reported.  MEDICAL HISTORY:  Past Medical History  Diagnosis Date  . Hyperlipemia   . Hypertension   . Metastatic colon cancer to liver ([place]) 07/21/2014  . Wears glasses     contacts/glasses  . Snoring   . Back pain   . Family history of colon cancer   . Diabetes mellitus without complication ([place])     SURGICAL HISTORY: Past Surgical History  Procedure Laterality Date  . Mouth surgery    . Cataract extraction Bilateral   . Portacath placement N/A 08/03/2014    Procedure: INSERTION PORT-A-CATH;  Surgeon: Stark Klein, MD;  Location: [place];  Service: General;  Laterality: N/A;    SOCIAL HISTORY: Social History   Social History  . Marital Status: Married    Spouse Name: Charlotte Crumb  . Number of Children: 2  . Years of Education: N/A   Occupational History  .  Not on file.   Social History Main Topics  . Smoking status: Former Smoker -- 1.00 packs/day for 6 years    Types: Cigarettes    Quit date: 01/21/1976  . Smokeless tobacco: Not on file  . Alcohol Use: No  . Drug Use: No  . Sexual Activity: Yes    Birth Control/ Protection: Post-menopausal     Comment: # 2 pregnancies and #2 live births   Other Topics Concern  . Not on file   Social History Narrative   Married, husband Michelle Erickson   Has #2  adult children   Previously worked at [employer] HISTORY: Family History  Problem Relation Age of Onset  . COPD Father   . Heart attack Father   . Arthritis Sister   . Diabetes Sister   . Colon cancer Sister     dx over 91  . Colon polyps Sister   . Seizures Brother   . Colon cancer Sister     dx in her early 29s, died in her early to mid 55s  . Diabetes Mother     ALLERGIES:  has No Known Allergies.  MEDICATIONS:  Current Outpatient Prescriptions  Medication Sig Dispense Refill  . amLODipine (NORVASC) 5 MG tablet Take 2.5 mg by mouth daily.     Marland Kitchen atorvastatin (LIPITOR) 40 MG tablet Take 40 mg by mouth daily.    . cetirizine (ZYRTEC) 10 MG tablet Take 10 mg by mouth daily as needed for allergies.    . ferrous sulfate 325 (65 FE) MG tablet Take 325 mg by mouth daily with breakfast.    . Insulin Glargine (LANTUS SOLOSTAR) 100 UNIT/ML Solostar Pen Inject 23 Units into the skin daily at 10 pm.    . insulin lispro (HUMALOG) 100 UNIT/ML injection Inject 4-8 Units into the skin 2 (two) times daily as needed for high blood sugar. Sliding scale    . lidocaine-prilocaine (EMLA) cream     . ondansetron (ZOFRAN-ODT) 8 MG disintegrating tablet Take 8 mg by mouth every 8 (eight) hours as needed for nausea or vomiting.     No current facility-administered medications for this visit.    REVIEW OF SYSTEMS:   Constitutional: Denies fevers, chills or abnormal night sweats Eyes: Denies blurriness of vision, double vision or watery eyes Ears, nose, mouth, throat, and face: Denies mucositis or sore throat Respiratory: Denies cough, dyspnea or wheezes Cardiovascular: Denies palpitation, chest discomfort or lower extremity swelling Gastrointestinal:  Denies nausea, heartburn or change in bowel habits Skin: Denies abnormal skin rashes Lymphatics: Denies new lymphadenopathy or easy bruising Neurological:Denies numbness, tingling or new weaknesses Behavioral/Psych: Mood is  stable, no new changes  All other systems were reviewed with the patient and are negative.  PHYSICAL EXAMINATION: ECOG PERFORMANCE STATUS: 1 - Symptomatic but completely ambulatory   Filed Vitals:   11/09/14 0847  BP: 162/71  Pulse: 94  Temp: 98.2 F (36.8 C)  Resp: 18     GENERAL:alert, no distress and comfortable SKIN: skin color, texture, turgor are normal, no rashes or significant lesions EYES: normal, conjunctiva are pink and non-injected, sclera clear OROPHARYNX:no exudate, no erythema and lips, buccal mucosa, and tongue normal  NECK: supple, thyroid normal size, non-tender, without nodularity LYMPH:  no palpable lymphadenopathy in the cervical, axillary or inguinal LUNGS: clear to auscultation and percussion with normal breathing effort HEART: regular rate & rhythm and no murmurs and no lower extremity edema ABDOMEN:abdomen soft, non-tender and normal bowel sounds  Musculoskeletal:no cyanosis of digits and no clubbing  PSYCH: alert & oriented x 3 with fluent speech NEURO: no focal motor/sensory deficits  LABORATORY DATA:  I have reviewed the data as listed CBC Latest Ref Rng 11/09/2014 10/26/2014 10/12/2014  WBC 3.9 - 10.3 10e3/uL 4.6 5.0 17.5(H)  Hemoglobin 11.6 - 15.9 g/dL 9.8(L) 9.5(L) 9.1(L)  Hematocrit 34.8 - 46.6 % 30.3(L) 29.7(L) 28.5(L)  Platelets 145 - 400 10e3/uL 95(L) 124(L) 123(L)     Recent Labs  07/13/14 2051  08/11/14 2047 08/13/14 8016 08/14/14 0435  10/12/14 0813 10/26/14 0853 11/09/14 0834  NA  --   < > 133* 138 135  < > 141 139 143  K  --   < > 4.3 4.0 4.0  < > 4.4 4.5 4.4  CL  --   < > 98* 109 105  --   --   --   --   CO2  --   < > $R'26 26 25  'cj$ < > $R'26 24 26  'XL$ GLUCOSE  --   < > 97 110* 138*  < > 188* 132 96  BUN  --   < > 22* 18 18  < > 13.9 17.8 16.4  CREATININE  --   < > 0.98 1.04* 0.94  < > 1.5* 1.0 1.1  CALCIUM  --   < > 8.8* 8.2* 8.3*  < > 9.1 9.5 9.8  GFRNONAA  --   < > >60 57* >60  --   --   --   --   GFRAA  --   < > >60 >60 >60  --    --   --   --   PROT 7.6  < > 7.6 5.9* 6.5  < > 6.5 7.1 7.4  ALBUMIN 3.8  < > 3.7 2.7* 2.9*  < > 3.6 3.7 3.9  AST 65*  < > 70* 31 30  < > 36* 22 26  ALT 54  < > 51 28 28  < > 38 29 34  ALKPHOS 146*  < > 178* 134* 146*  < > 181* 117 110  BILITOT 0.9  < > 0.7 1.0 0.7  < > <0.30 <0.30 <0.30  BILIDIR 0.2  --   --   --  0.1  --   --   --   --   IBILI 0.7  --   --   --  0.6  --   --   --   --   < > = values in this interval not displayed.  PATHOLOGY REPORTS: Diagnosis 07/25/2014  Surgical [P], sigmoid - INVASIVE ADENOCARCINOMA ARISING IN A BACKGROUND OF HIGH GRADE DYSPLASIA. - SEE COMMENT.  Microscopic Comment Results are phoned to Dr. Olevia Perches. Internal departmental review obtained (Dr. Donato Heinz) with agreement.  Diagnosis 09/05/2014 Liver, biopsy - METASTATIC ADENOCARCINOMA, SEE COMMENT.  Microscopic Comment Given the patient's history of sigmoid invasive adenocarcinoma and the morphology, the findings are consistent with metastatic colorectal adenocarcinoma. The case was called to Dr. Burr Medico. Per request, MMR, MSI, and KRAS testing will be ordered.  KRAS MUTATION (+) c36G>A (p. G12D)  RADIOGRAPHIC STUDIES: I have personally reviewed the radiological images as listed and agreed with the findings in the report.   CT chest, abdomen and pelvis with contrast 10/10/2014 IMPRESSION: Interval decrease in size of multiple hepatic metastatic lesions.  Interval decrease in size of soft tissue mass adjacent to the sigmoid colon.  Interval decrease in wall thickening of the sigmoid colon.  Colonoscopy 07/25/2014 Dr. Olevia Perches  ENDOSCOPIC IMPRESSION: Near circumferential medium mass was found in the sigmoid colon; multiple biopsies were performed using cold forceps, partially obstructing mass consistent with carcinoma. Placement of endoclips and tattoo at the margins of the mass which extends from 18-25 cm from the anus  ASSESSMENT & PLAN: 59 year old African-American female, presented with  intermittent nausea vomiting and anemia.   1. Sigmoid colon cancer with liver metastasis, TxNxM1, stage IV, MSI stable, KRAS mutation(+) Currently on FOLFOX and Avastin every 2 weeks intravenously, to continue as long as she tolerates and no disease progression -her tumor MSI is stable, KRAS mutated, not a candidate for immunotherapy or EGFR inhibitor. -She is tolerating chemotherapy well, CEA has dropped significantly, restaging CT scan showed partial response. -Lab reviewed with Dr. Burr Medico, mild thrombocytopenia, with a count of 95,000 without acute bleeding issues. will Proceed with Day 1 Cycle 7 today with dose reduced chemo.  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 DM, HTN -Continue follow-up with PCP - we discussed the potential impact of chemotherapy and premedication dexamethasone on her blood pressure and sugar,will  Monitor closely. -Her blood pressure is better controlled with the increase of amlodipine from 2.5 to $Remo'5mg'pgbur$  daily. She is to continue monitoring with her PCP  4. AKI -Her creatinine was 1.52 weeks ago,for which she received IV hydration with improvement of those values, today at 1.0 Continue oral hydration at home.  Plan -cycle 7 FOLFOX plus Avastin today, no Neulasta on day 3 -she will see Dr. Burr Medico in 2 weeks for follow up.  All questions were answered. The patient knows to call the clinic with any problems, questions or concerns. I spent 20 minutes counseling the patient face to face. The total time spent in the appointment was 30 minutes and more than 50% was on counseling.     Rondel Jumbo, PA-C 11/09/2014 9:14 AM

## 2014-11-11 ENCOUNTER — Ambulatory Visit (HOSPITAL_BASED_OUTPATIENT_CLINIC_OR_DEPARTMENT_OTHER): Payer: BLUE CROSS/BLUE SHIELD

## 2014-11-11 VITALS — BP 153/80 | HR 89 | Temp 98.1°F | Resp 19

## 2014-11-11 DIAGNOSIS — C787 Secondary malignant neoplasm of liver and intrahepatic bile duct: Principal | ICD-10-CM

## 2014-11-11 DIAGNOSIS — Z452 Encounter for adjustment and management of vascular access device: Secondary | ICD-10-CM | POA: Diagnosis not present

## 2014-11-11 DIAGNOSIS — C187 Malignant neoplasm of sigmoid colon: Secondary | ICD-10-CM

## 2014-11-11 DIAGNOSIS — C189 Malignant neoplasm of colon, unspecified: Secondary | ICD-10-CM

## 2014-11-11 MED ORDER — SODIUM CHLORIDE 0.9 % IJ SOLN
10.0000 mL | INTRAMUSCULAR | Status: DC | PRN
  Administered 2014-11-11: 10 mL
  Filled 2014-11-11: qty 10

## 2014-11-11 MED ORDER — HEPARIN SOD (PORK) LOCK FLUSH 100 UNIT/ML IV SOLN
500.0000 [IU] | Freq: Once | INTRAVENOUS | Status: AC | PRN
  Administered 2014-11-11: 500 [IU]
  Filled 2014-11-11: qty 5

## 2014-11-11 NOTE — Patient Instructions (Signed)

## 2014-11-23 ENCOUNTER — Telehealth: Payer: Self-pay | Admitting: Hematology

## 2014-11-23 ENCOUNTER — Other Ambulatory Visit (HOSPITAL_BASED_OUTPATIENT_CLINIC_OR_DEPARTMENT_OTHER): Payer: BLUE CROSS/BLUE SHIELD

## 2014-11-23 ENCOUNTER — Encounter: Payer: Self-pay | Admitting: *Deleted

## 2014-11-23 ENCOUNTER — Telehealth: Payer: Self-pay | Admitting: *Deleted

## 2014-11-23 ENCOUNTER — Ambulatory Visit (HOSPITAL_BASED_OUTPATIENT_CLINIC_OR_DEPARTMENT_OTHER): Payer: BLUE CROSS/BLUE SHIELD | Admitting: Hematology

## 2014-11-23 ENCOUNTER — Ambulatory Visit (HOSPITAL_BASED_OUTPATIENT_CLINIC_OR_DEPARTMENT_OTHER): Payer: BLUE CROSS/BLUE SHIELD

## 2014-11-23 ENCOUNTER — Encounter: Payer: Self-pay | Admitting: Hematology

## 2014-11-23 VITALS — BP 145/66 | HR 92 | Temp 97.9°F | Resp 20 | Ht 64.0 in | Wt 156.0 lb

## 2014-11-23 DIAGNOSIS — D649 Anemia, unspecified: Secondary | ICD-10-CM

## 2014-11-23 DIAGNOSIS — Z5112 Encounter for antineoplastic immunotherapy: Secondary | ICD-10-CM | POA: Diagnosis not present

## 2014-11-23 DIAGNOSIS — D6959 Other secondary thrombocytopenia: Secondary | ICD-10-CM

## 2014-11-23 DIAGNOSIS — E119 Type 2 diabetes mellitus without complications: Secondary | ICD-10-CM | POA: Diagnosis not present

## 2014-11-23 DIAGNOSIS — C187 Malignant neoplasm of sigmoid colon: Secondary | ICD-10-CM

## 2014-11-23 DIAGNOSIS — Z5111 Encounter for antineoplastic chemotherapy: Secondary | ICD-10-CM

## 2014-11-23 DIAGNOSIS — C787 Secondary malignant neoplasm of liver and intrahepatic bile duct: Secondary | ICD-10-CM | POA: Diagnosis not present

## 2014-11-23 DIAGNOSIS — I10 Essential (primary) hypertension: Secondary | ICD-10-CM

## 2014-11-23 DIAGNOSIS — C189 Malignant neoplasm of colon, unspecified: Secondary | ICD-10-CM

## 2014-11-23 LAB — CBC WITH DIFFERENTIAL/PLATELET
BASO%: 0.5 % (ref 0.0–2.0)
BASOS ABS: 0 10*3/uL (ref 0.0–0.1)
EOS ABS: 0.2 10*3/uL (ref 0.0–0.5)
EOS%: 6.5 % (ref 0.0–7.0)
HEMATOCRIT: 31.3 % — AB (ref 34.8–46.6)
HEMOGLOBIN: 10 g/dL — AB (ref 11.6–15.9)
LYMPH#: 1 10*3/uL (ref 0.9–3.3)
LYMPH%: 26 % (ref 14.0–49.7)
MCH: 28.6 pg (ref 25.1–34.0)
MCHC: 32 g/dL (ref 31.5–36.0)
MCV: 89.6 fL (ref 79.5–101.0)
MONO#: 0.6 10*3/uL (ref 0.1–0.9)
MONO%: 15.6 % — ABNORMAL HIGH (ref 0.0–14.0)
NEUT%: 51.4 % (ref 38.4–76.8)
NEUTROS ABS: 2 10*3/uL (ref 1.5–6.5)
PLATELETS: 103 10*3/uL — AB (ref 145–400)
RBC: 3.49 10*6/uL — ABNORMAL LOW (ref 3.70–5.45)
RDW: 20.6 % — AB (ref 11.2–14.5)
WBC: 3.8 10*3/uL — AB (ref 3.9–10.3)

## 2014-11-23 LAB — COMPREHENSIVE METABOLIC PANEL (CC13)
ALBUMIN: 3.8 g/dL (ref 3.5–5.0)
ALK PHOS: 115 U/L (ref 40–150)
ALT: 44 U/L (ref 0–55)
ANION GAP: 8 meq/L (ref 3–11)
AST: 34 U/L (ref 5–34)
BUN: 21.3 mg/dL (ref 7.0–26.0)
CALCIUM: 9.9 mg/dL (ref 8.4–10.4)
CO2: 24 mEq/L (ref 22–29)
Chloride: 110 mEq/L — ABNORMAL HIGH (ref 98–109)
Creatinine: 1.2 mg/dL — ABNORMAL HIGH (ref 0.6–1.1)
EGFR: 59 mL/min/{1.73_m2} — AB (ref >90)
GLUCOSE: 86 mg/dL (ref 70–140)
Potassium: 4.9 mEq/L (ref 3.5–5.1)
SODIUM: 142 meq/L (ref 136–145)
TOTAL PROTEIN: 7.5 g/dL (ref 6.4–8.3)

## 2014-11-23 LAB — CEA: CEA: 18.2 ng/mL — AB (ref 0.0–5.0)

## 2014-11-23 MED ORDER — BEVACIZUMAB CHEMO INJECTION 400 MG/16ML
5.0000 mg/kg | Freq: Once | INTRAVENOUS | Status: AC
  Administered 2014-11-23: 350 mg via INTRAVENOUS
  Filled 2014-11-23: qty 14

## 2014-11-23 MED ORDER — SODIUM CHLORIDE 0.9 % IV SOLN
Freq: Once | INTRAVENOUS | Status: AC
  Administered 2014-11-23: 13:00:00 via INTRAVENOUS

## 2014-11-23 MED ORDER — LEUCOVORIN CALCIUM INJECTION 350 MG
400.0000 mg/m2 | Freq: Once | INTRAVENOUS | Status: AC
  Administered 2014-11-23: 716 mg via INTRAVENOUS
  Filled 2014-11-23: qty 35.8

## 2014-11-23 MED ORDER — SODIUM CHLORIDE 0.9 % IV SOLN
2200.0000 mg/m2 | INTRAVENOUS | Status: DC
  Administered 2014-11-23: 3950 mg via INTRAVENOUS
  Filled 2014-11-23: qty 79

## 2014-11-23 MED ORDER — SODIUM CHLORIDE 0.9 % IV SOLN
Freq: Once | INTRAVENOUS | Status: AC
  Administered 2014-11-23: 10:00:00 via INTRAVENOUS
  Filled 2014-11-23: qty 4

## 2014-11-23 MED ORDER — DEXTROSE 5 % IV SOLN
Freq: Once | INTRAVENOUS | Status: AC
  Administered 2014-11-23: 10:00:00 via INTRAVENOUS

## 2014-11-23 MED ORDER — DEXTROSE 5 % IV SOLN
80.0000 mg/m2 | Freq: Once | INTRAVENOUS | Status: AC
  Administered 2014-11-23: 145 mg via INTRAVENOUS
  Filled 2014-11-23: qty 29

## 2014-11-23 NOTE — Telephone Encounter (Signed)
Per staff message and POF I have scheduled appts. Advised scheduler of appts. JMW  

## 2014-11-23 NOTE — Progress Notes (Signed)
[place]  Telephone:(336) [phone] Fax:(336) [phone]  Clinic Follow Up Note   Patient Care Team: Dione Housekeeper, MD as PCP - General (Family Medicine) Tania Ade, RN as Registered Nurse (Oncology) 11/23/2014  CHIEF COMPLAINTS:  Follow-up of metastatic colon cancer  Oncology History   Metastatic colon cancer to liver   Staging form: Colon and Rectum, AJCC 7th Edition     Clinical: Stage Unknown ([place], NX, M1) - Unsigned        Metastatic colon cancer to liver ([place])   07/14/2014 Imaging CT abdomen and pelvis with contrast showed a 6 cm segment of sigmoid bowel wall thickening with adjacent 14 x 12 mm nodules, diffuse hepatic metastasis. No bowel obstruction.   07/14/2014 Initial Diagnosis Metastatic colon cancer to liver   07/25/2014 Procedure Colonoscopy showed a near circumferential medial mass in the sigmoid colon, partially obstructing consistent with carcinoma.   07/25/2014 Initial Biopsy Sigmoid: Biopsy showed invasive adenocarcinoma arising in a background of high-grade dysplasia.   07/28/2014 Imaging CT chest showed no evidence of metastasis   08/09/2014 -  Chemotherapy FOLFOX every 2 weeks    08/11/2014 - 08/15/2014 Hospital Admission Pt was admitted for fever and nausea, treated for UTI, liver biopsy cancelled due to the infection     HISTORY OF PRESENTING ILLNESS:  Michelle Erickson 59 y.o. female is here because of abnoaml CT findings which is highly suspecious for metastatic colon cancer.   She presented intermittent nausea, anorexia and abdominal pain since Nov 2015, she has mild pain in the LUQ, crapy pain, positional (bending over),   it happened once every 2-3 weeks, and it has been more frequent in the past month. She has normal BM, but did notice the caliber of the stool is smaller than before. She denies any melena or hematochezia. She lost about 13 lbs in the past 6 months.   She presents to local emergency room  in November 2015, and May 2016, was felt to be related to gastric virus. Due to the worsening symptoms lately, she went to [facility] ED on 07/13/2014, CT abdomen was obtained which showed multiple large liver metastasis and probable sigmoid colon mass. She was referred to [place] for further workup. She is scheduled to see GI Dr. Olevia Perches this afternoon.  INTERIM HISTORY Michelle Erickson returns for follow-up and 8th cycle chemo. She is doing well. She has mild fatigue and some taste change from chemo, but otherwise tolerates treatment very well. She has good appetite and energy level. No other new complaints. No fever or chills. She can't a few pounds in the past month.   MEDICAL HISTORY:  Past Medical History  Diagnosis Date  . Hyperlipemia   . Hypertension   . Metastatic colon cancer to liver ([place]) 07/21/2014  . Wears glasses     contacts/glasses  . Snoring   . Back pain   . Family history of colon cancer   . Diabetes mellitus without complication ([place])     SURGICAL HISTORY: Past Surgical History  Procedure Laterality Date  . Mouth surgery    . Cataract extraction Bilateral   . Portacath placement N/A 08/03/2014    Procedure: INSERTION PORT-A-CATH;  Surgeon: Stark Klein, MD;  Location: [place];  Service: General;  Laterality: N/A;    SOCIAL HISTORY: Social History   Social History  . Marital Status: Married    Spouse Name: Charlotte Crumb  . Number of Children: 2  . Years of Education: N/A   Occupational History  .  Not on file.   Social History Main Topics  . Smoking status: Former Smoker -- 1.00 packs/day for 6 years    Types: Cigarettes    Quit date: 01/21/1976  . Smokeless tobacco: Not on file  . Alcohol Use: No  . Drug Use: No  . Sexual Activity: Yes    Birth Control/ Protection: Post-menopausal     Comment: # 2 pregnancies and #2 live births   Other Topics Concern  . Not on file   Social History Narrative   Married, husband [name]   Has #2 adult children   Previously  worked at [employer] HISTORY: Family History  Problem Relation Age of Onset  . COPD Father   . Heart attack Father   . Arthritis Sister   . Diabetes Sister   . Colon cancer Sister     dx over 55  . Colon polyps Sister   . Seizures Brother   . Colon cancer Sister     dx in her early 60s, died in her early to mid 50s  . Diabetes Mother     ALLERGIES:  has No Known Allergies.  MEDICATIONS:  Current Outpatient Prescriptions  Medication Sig Dispense Refill  . amLODipine (NORVASC) 5 MG tablet Take 2.5 mg by mouth daily.     Marland Kitchen atorvastatin (LIPITOR) 40 MG tablet Take 40 mg by mouth daily.    . cetirizine (ZYRTEC) 10 MG tablet Take 10 mg by mouth daily as needed for allergies.    . ferrous sulfate 325 (65 FE) MG tablet Take 325 mg by mouth daily with breakfast.    . Insulin Glargine (LANTUS SOLOSTAR) 100 UNIT/ML Solostar Pen Inject 23 Units into the skin daily at 10 pm.    . insulin lispro (HUMALOG) 100 UNIT/ML injection Inject 4-8 Units into the skin 2 (two) times daily as needed for high blood sugar. Sliding scale    . lidocaine-prilocaine (EMLA) cream     . ondansetron (ZOFRAN-ODT) 8 MG disintegrating tablet Take 8 mg by mouth every 8 (eight) hours as needed for nausea or vomiting.     No current facility-administered medications for this visit.    REVIEW OF SYSTEMS:   Constitutional: Denies fevers, chills or abnormal night sweats Eyes: Denies blurriness of vision, double vision or watery eyes Ears, nose, mouth, throat, and face: Denies mucositis or sore throat Respiratory: Denies cough, dyspnea or wheezes Cardiovascular: Denies palpitation, chest discomfort or lower extremity swelling Gastrointestinal:  Denies nausea, heartburn or change in bowel habits Skin: Denies abnormal skin rashes Lymphatics: Denies new lymphadenopathy or easy bruising Neurological:Denies numbness, tingling or new weaknesses Behavioral/Psych: Mood is stable, no new changes  All other  systems were reviewed with the patient and are negative.  PHYSICAL EXAMINATION: ECOG PERFORMANCE STATUS: 1 - Symptomatic but completely ambulatory BP 145/66 mmHg  Pulse 92  Temp(Src) 97.9 F (36.6 C) (Oral)  Resp 20  Ht _0  (1.626 m)  Wt 156 lb (70.761 kg)  BMI 26.76 kg/m2  SpO2 100% GENERAL:alert, no distress and comfortable SKIN: skin color, texture, turgor are normal, no rashes or significant lesions EYES: normal, conjunctiva are pink and non-injected, sclera clear OROPHARYNX:no exudate, no erythema and lips, buccal mucosa, and tongue normal  NECK: supple, thyroid normal size, non-tender, without nodularity LYMPH:  no palpable lymphadenopathy in the cervical, axillary or inguinal LUNGS: clear to auscultation and percussion with normal breathing effort HEART: regular rate & rhythm and no murmurs and no lower extremity  edema ABDOMEN:abdomen soft, non-tender and normal bowel sounds Musculoskeletal:no cyanosis of digits and no clubbing  PSYCH: alert & oriented x 3 with fluent speech NEURO: no focal motor/sensory deficits  LABORATORY DATA:  I have reviewed the data as listed CBC Latest Ref Rng 11/23/2014 11/09/2014 10/26/2014  WBC 3.9 - 10.3 10e3/uL 3.8(L) 4.6 5.0  Hemoglobin 11.6 - 15.9 g/dL 10.0(L) 9.8(L) 9.5(L)  Hematocrit 34.8 - 46.6 % 31.3(L) 30.3(L) 29.7(L)  Platelets 145 - 400 10e3/uL 103(L) 95(L) 124(L)     Recent Labs  07/13/14 2051  08/11/14 2047 08/13/14 0558 08/14/14 0435  10/26/14 0853 11/09/14 0834 11/23/14 0843  NA  --   < > 133* 138 135  < > 139 143 142  K  --   < > 4.3 4.0 4.0  < > 4.5 4.4 4.9  CL  --   < > 98* 109 105  --   --   --   --   CO2  --   < > _0 < > _1 GLUCOSE  --   < > 97 110* 138*  < > 132 96 86  BUN  --   < > 22* 18 18  < > 17.8 16.4 21.3  CREATININE  --   < > 0.98 1.04* 0.94  < > 1.0 1.1 1.2*  CALCIUM  --   < > 8.8* 8.2* 8.3*  < > 9.5 9.8 9.9  GFRNONAA  --   < > >60 57* >60  --   --   --   --   GFRAA  --   < > >60 >60  >60  --   --   --   --   PROT 7.6  < > 7.6 5.9* 6.5  < > 7.1 7.4 7.5  ALBUMIN 3.8  < > 3.7 2.7* 2.9*  < > 3.7 3.9 3.8  AST 65*  < > 70* 31 30  < > 22 26 34  ALT 54  < > 51 28 28  < > 29 34 44  ALKPHOS 146*  < > 178* 134* 146*  < > 117 110 115  BILITOT 0.9  < > 0.7 1.0 0.7  < > <0.30 <0.30 <0.30  BILIDIR 0.2  --   --   --  0.1  --   --   --   --   IBILI 0.7  --   --   --  0.6  --   --   --   --   < > = values in this interval not displayed.  CEA  Status: Finalresult Visible to patient:  Not Released Nextappt: 12/07/2014 at 09:45 AM in Oncology ([facility]) Dx:  Metastatic colon cancer to liver ([name])              Ref Range 2d ago (11/23/14) 70moago (10/26/14) 184mogo (09/28/14) 46m50moo (09/07/14)    CEA 0.0 - 5.0 ng/mL 18.2 (H) 54.6 (H) 182.4 (H)CM 555.1 (H)CM         PATHOLOGY REPORTS: Diagnosis 07/25/2014  Surgical [P], sigmoid - INVASIVE ADENOCARCINOMA ARISING IN A BACKGROUND OF HIGH GRADE DYSPLASIA. - SEE COMMENT. Microscopic Comment Results are phoned to Dr. BroOlevia Perchesnternal departmental review obtained (Dr. RunDonato Heinzith agreement.  Diagnosis 09/05/2014 Liver, biopsy - METASTATIC ADENOCARCINOMA, SEE COMMENT. Microscopic Comment Given the patient's history of sigmoid invasive adenocarcinoma and the morphology, the findings are consistent with metastatic colorectal adenocarcinoma. The case was called to  Dr. Burr Medico. Per request, MMR, MSI, and KRAS testing will be ordered.  KRAS MUTATION (+) c36G>A (p. G12D)  RADIOGRAPHIC STUDIES: I have personally reviewed the radiological images as listed and agreed with the findings in the report.   CT chest, abdomen and pelvis with contrast 10/10/2014 IMPRESSION: Interval decrease in size of multiple hepatic metastatic lesions.  Interval decrease in size of soft tissue mass adjacent to the sigmoid colon.  Interval decrease in wall thickening of the sigmoid colon.   Colonoscopy 07/25/2014 Dr. Olevia Perches    ENDOSCOPIC IMPRESSION: Near circumferential medium mass was found in the sigmoid colon; multiple biopsies were performed using cold forceps, partially obstructing mass consistent with carcinoma. Placement of endoclips and tattoo at the margins of the mass which extends from 18-25 cm from the anus  ASSESSMENT & PLAN: 59 year old African-American female, presented with intermittent nausea vomiting and anemia.   1. Sigmoid colon cancer with liver metastasis, TxNxM1, stage IV, MSI stable, KRAS mutation(+) -I reviewed her CT scans, colonoscopy, and colon mass biopsy findings with patient and her husband in great details, images were reviewed in person  -I reviewed her liver biopsy results, which confirmed metastasis from colon cancer. -The natural history of metastatic colon cancer and treatment options were discussed with patient and her husband. Giving the diffuse metastasis in the liver, this is unfortunately incurable disease. I recommend systemic chemotherapy to control her metastatic disease, the goal of therapy is palliative and to prolong her life. -The options of first-line chemotherapy were discussed with her, I recommend FOLFOX and Avastin every 2 weeks intravenously, and I will continue as long as she tolerates and no disease progression -her tumor MSI is stable, KRAS mutated, not a candidate for immunotherapy or EGFR inhibitor. -She is tolerating chemotherapy well, CEA has dropped significantly, restaging CT scan showed partial response. We'll continue chemotherapy  -Lab reviewed, mild thrombocytopenia and leukopenia, ANC 2.0 today, anemia stable, Adequate for Chemotherapy, Will Proceed with Cycle 8 today.  2. Anemia -Likely secondary to tumor bleeding and iron deficiency -stable, Hb 10 today, not symptomatic  -Her ferritin is normal, serum iron and saturation slightly low, TIBC low normal, I suggest her to take oral iron supplement, she tolerates well, we'll continue once daily, with  orange juice or vitamin C   3. DM, HTN -Continue follow-up with PCP - we discussed the potential impact of chemotherapy and premedication dexamethasone on her blood pressure and sugar,will  Monitor closely. Her BG has been well controlled  -Her blood pressure has been high lately, we discussed that Avastin can increase by pressure, I have increased her Amlodipine to 4m daily   4. AKI -resolved, but Cr slightly increased to 1.2 today, I encouraged her to drink more fluids  5. Mild thrombocytopenia -Secondary to chemotherapy. -will monitor closely.  PLan -cycle 8 FOLFOX plus Avastin today, no Neulasta on day 3 - I'll see her back in 4 weeks  All questions were answered. The patient knows to call the clinic with any problems, questions or concerns. I spent 20 minutes counseling the patient face to face. The total time spent in the appointment was 30 minutes and more than 50% was on counseling.     FTruitt Merle MD 11/23/2014 7:47 AM

## 2014-11-23 NOTE — Patient Instructions (Signed)
[facility] Discharge Instructions for Patients Receiving Chemotherapy  Today you received the following chemotherapy agents; Oxaliplatin, Leucovorin, and Fluorouracil,  To help prevent nausea and vomiting after your treatment, we encourage you to take your nausea medication as directed.    If you develop nausea and vomiting that is not controlled by your nausea medication, call the clinic.   BELOW ARE SYMPTOMS THAT SHOULD BE REPORTED IMMEDIATELY:  *FEVER GREATER THAN 100.5 F  *CHILLS WITH OR WITHOUT FEVER  NAUSEA AND VOMITING THAT IS NOT CONTROLLED WITH YOUR NAUSEA MEDICATION  *UNUSUAL SHORTNESS OF BREATH  *UNUSUAL BRUISING OR BLEEDING  TENDERNESS IN MOUTH AND THROAT WITH OR WITHOUT PRESENCE OF ULCERS  *URINARY PROBLEMS  *BOWEL PROBLEMS  UNUSUAL RASH Items with * indicate a potential emergency and should be followed up as soon as possible.  Feel free to call the clinic you have any questions or concerns. The clinic phone number is [phone].  Please show the [place] at check-in to the Emergency Department and triage nurse.

## 2014-11-23 NOTE — Telephone Encounter (Signed)
per pf to sch pt appt-sent MW email to sch pt trmt-pt to get updated copy on 11/5

## 2014-11-23 NOTE — Progress Notes (Signed)
Oncology Nurse Navigator Documentation  Oncology Nurse Navigator Flowsheets 11/23/2014  Referral date to RadOnc/MedOnc -  Navigator Encounter Type Treatment;3 month  Patient Visit Type Medonc  Treatment Phase Treatment--FOLFOX/Avastin  Barriers/Navigation Needs No barriers at this time  Interventions -  Support Groups/Services GI-provided November Support Services Calendar and encouraged her to come to GI Group  Time Spent with Patient 10

## 2014-11-25 ENCOUNTER — Ambulatory Visit (HOSPITAL_BASED_OUTPATIENT_CLINIC_OR_DEPARTMENT_OTHER): Payer: BLUE CROSS/BLUE SHIELD

## 2014-11-25 VITALS — BP 111/63 | HR 95 | Temp 98.7°F | Resp 18

## 2014-11-25 DIAGNOSIS — C187 Malignant neoplasm of sigmoid colon: Secondary | ICD-10-CM | POA: Diagnosis not present

## 2014-11-25 DIAGNOSIS — C787 Secondary malignant neoplasm of liver and intrahepatic bile duct: Principal | ICD-10-CM

## 2014-11-25 DIAGNOSIS — C189 Malignant neoplasm of colon, unspecified: Secondary | ICD-10-CM

## 2014-11-25 MED ORDER — SODIUM CHLORIDE 0.9 % IJ SOLN
10.0000 mL | INTRAMUSCULAR | Status: DC | PRN
  Administered 2014-11-25: 10 mL
  Filled 2014-11-25: qty 10

## 2014-11-25 MED ORDER — HEPARIN SOD (PORK) LOCK FLUSH 100 UNIT/ML IV SOLN
500.0000 [IU] | Freq: Once | INTRAVENOUS | Status: AC | PRN
  Administered 2014-11-25: 500 [IU]
  Filled 2014-11-25: qty 5

## 2014-11-29 ENCOUNTER — Other Ambulatory Visit: Payer: Self-pay | Admitting: *Deleted

## 2014-11-29 ENCOUNTER — Telehealth: Payer: Self-pay | Admitting: *Deleted

## 2014-11-29 DIAGNOSIS — C787 Secondary malignant neoplasm of liver and intrahepatic bile duct: Principal | ICD-10-CM

## 2014-11-29 DIAGNOSIS — C189 Malignant neoplasm of colon, unspecified: Secondary | ICD-10-CM

## 2014-11-29 MED ORDER — AMLODIPINE BESYLATE 5 MG PO TABS: 10.0000 mg | ORAL_TABLET | Freq: Every day | ORAL | Status: DC

## 2014-11-29 NOTE — Telephone Encounter (Signed)
Received call from Richardson Landry, pharmacist at [facility] inquiring about Norvasc refill.  Per Dr. Ernestina Penna last office notes on 11/23/14,  Norvasc was increased to 10 mg daily due to high blood pressure with pt taking Avastin.   Informed Richardson Landry that  A One Time refill of Norvasc is authorized this time;   Future refills will need to be directed to pt's primary Dr. Edrick Oh.   Richardson Landry voiced understanding. [facility]  Phone    [phone].

## 2014-12-07 ENCOUNTER — Other Ambulatory Visit (HOSPITAL_BASED_OUTPATIENT_CLINIC_OR_DEPARTMENT_OTHER): Payer: BLUE CROSS/BLUE SHIELD

## 2014-12-07 ENCOUNTER — Ambulatory Visit (HOSPITAL_BASED_OUTPATIENT_CLINIC_OR_DEPARTMENT_OTHER): Payer: BLUE CROSS/BLUE SHIELD

## 2014-12-07 ENCOUNTER — Ambulatory Visit (HOSPITAL_BASED_OUTPATIENT_CLINIC_OR_DEPARTMENT_OTHER): Payer: BLUE CROSS/BLUE SHIELD | Admitting: Nurse Practitioner

## 2014-12-07 VITALS — BP 134/65 | HR 81 | Temp 98.3°F | Resp 18 | Ht 64.0 in | Wt 157.5 lb

## 2014-12-07 DIAGNOSIS — C189 Malignant neoplasm of colon, unspecified: Secondary | ICD-10-CM

## 2014-12-07 DIAGNOSIS — C787 Secondary malignant neoplasm of liver and intrahepatic bile duct: Secondary | ICD-10-CM | POA: Diagnosis not present

## 2014-12-07 DIAGNOSIS — Z5112 Encounter for antineoplastic immunotherapy: Secondary | ICD-10-CM

## 2014-12-07 DIAGNOSIS — C187 Malignant neoplasm of sigmoid colon: Secondary | ICD-10-CM

## 2014-12-07 DIAGNOSIS — Z5111 Encounter for antineoplastic chemotherapy: Secondary | ICD-10-CM | POA: Diagnosis not present

## 2014-12-07 LAB — COMPREHENSIVE METABOLIC PANEL (CC13)
ALT: 49 U/L (ref 0–55)
ANION GAP: 7 meq/L (ref 3–11)
AST: 50 U/L — ABNORMAL HIGH (ref 5–34)
Albumin: 3.5 g/dL (ref 3.5–5.0)
Alkaline Phosphatase: 110 U/L (ref 40–150)
BUN: 17.3 mg/dL (ref 7.0–26.0)
CHLORIDE: 109 meq/L (ref 98–109)
CO2: 24 meq/L (ref 22–29)
Calcium: 9.4 mg/dL (ref 8.4–10.4)
Creatinine: 1 mg/dL (ref 0.6–1.1)
EGFR: 71 mL/min/{1.73_m2} — AB (ref >90)
GLUCOSE: 131 mg/dL (ref 70–140)
Potassium: 4.6 mEq/L (ref 3.5–5.1)
Sodium: 140 mEq/L (ref 136–145)
TOTAL PROTEIN: 6.7 g/dL (ref 6.4–8.3)

## 2014-12-07 LAB — CBC WITH DIFFERENTIAL/PLATELET
BASO%: 0.8 % (ref 0.0–2.0)
Basophils Absolute: 0 10*3/uL (ref 0.0–0.1)
EOS%: 4.8 % (ref 0.0–7.0)
Eosinophils Absolute: 0.2 10*3/uL (ref 0.0–0.5)
HEMATOCRIT: 28.5 % — AB (ref 34.8–46.6)
HGB: 9.1 g/dL — ABNORMAL LOW (ref 11.6–15.9)
LYMPH#: 0.9 10*3/uL (ref 0.9–3.3)
LYMPH%: 24.1 % (ref 14.0–49.7)
MCH: 29.3 pg (ref 25.1–34.0)
MCHC: 31.9 g/dL (ref 31.5–36.0)
MCV: 91.6 fL (ref 79.5–101.0)
MONO#: 0.7 10*3/uL (ref 0.1–0.9)
MONO%: 17.4 % — ABNORMAL HIGH (ref 0.0–14.0)
NEUT%: 52.9 % (ref 38.4–76.8)
NEUTROS ABS: 2 10*3/uL (ref 1.5–6.5)
PLATELETS: 110 10*3/uL — AB (ref 145–400)
RBC: 3.11 10*6/uL — AB (ref 3.70–5.45)
RDW: 20.4 % — AB (ref 11.2–14.5)
WBC: 3.7 10*3/uL — AB (ref 3.9–10.3)

## 2014-12-07 LAB — UA PROTEIN, DIPSTICK - CHCC: Protein, ur: 30 mg/dL

## 2014-12-07 MED ORDER — SODIUM CHLORIDE 0.9 % IV SOLN
Freq: Once | INTRAVENOUS | Status: AC
  Administered 2014-12-07: 11:00:00 via INTRAVENOUS

## 2014-12-07 MED ORDER — HEPARIN SOD (PORK) LOCK FLUSH 100 UNIT/ML IV SOLN
500.0000 [IU] | Freq: Once | INTRAVENOUS | Status: AC | PRN
  Administered 2014-12-07: 500 [IU]
  Filled 2014-12-07: qty 5

## 2014-12-07 MED ORDER — SODIUM CHLORIDE 0.9 % IJ SOLN
10.0000 mL | INTRAMUSCULAR | Status: DC | PRN
  Administered 2014-12-07: 10 mL
  Filled 2014-12-07: qty 10

## 2014-12-07 MED ORDER — OXALIPLATIN CHEMO INJECTION 100 MG/20ML
80.0000 mg/m2 | Freq: Once | INTRAVENOUS | Status: AC
  Administered 2014-12-07: 145 mg via INTRAVENOUS
  Filled 2014-12-07: qty 29

## 2014-12-07 MED ORDER — SODIUM CHLORIDE 0.9 % IV SOLN
5.0000 mg/kg | Freq: Once | INTRAVENOUS | Status: AC
  Administered 2014-12-07: 350 mg via INTRAVENOUS
  Filled 2014-12-07: qty 14

## 2014-12-07 MED ORDER — SODIUM CHLORIDE 0.9 % IV SOLN
2200.0000 mg/m2 | INTRAVENOUS | Status: DC
  Administered 2014-12-07: 3950 mg via INTRAVENOUS
  Filled 2014-12-07: qty 79

## 2014-12-07 MED ORDER — DEXTROSE 5 % IV SOLN
Freq: Once | INTRAVENOUS | Status: AC
  Administered 2014-12-07: 13:00:00 via INTRAVENOUS

## 2014-12-07 MED ORDER — LEUCOVORIN CALCIUM INJECTION 350 MG
402.0000 mg/m2 | Freq: Once | INTRAVENOUS | Status: AC
  Administered 2014-12-07: 720 mg via INTRAVENOUS
  Filled 2014-12-07: qty 36

## 2014-12-07 MED ORDER — SODIUM CHLORIDE 0.9 % IV SOLN
Freq: Once | INTRAVENOUS | Status: AC
  Administered 2014-12-07: 13:00:00 via INTRAVENOUS
  Filled 2014-12-07: qty 4

## 2014-12-07 NOTE — Progress Notes (Signed)
  [facility] OFFICE PROGRESS NOTE   Diagnosis:  Metastatic colon cancer Oncology History   Metastatic colon cancer to liver  Staging form: Colon and Rectum, AJCC 7th Edition  Clinical: Stage Unknown ([place], NX, M1) - Unsigned        Metastatic colon cancer to liver ([place])   07/14/2014 Imaging CT abdomen and pelvis with contrast showed a 6 cm segment of sigmoid bowel wall thickening with adjacent 14 x 12 mm nodules, diffuse hepatic metastasis. No bowel obstruction.   07/14/2014 Initial Diagnosis Metastatic colon cancer to liver   07/25/2014 Procedure Colonoscopy showed a near circumferential medial mass in the sigmoid colon, partially obstructing consistent with carcinoma.   07/25/2014 Initial Biopsy Sigmoid: Biopsy showed invasive adenocarcinoma arising in a background of high-grade dysplasia.   07/28/2014 Imaging CT chest showed no evidence of metastasis   08/09/2014 -  Chemotherapy FOLFOX every 2 weeks    08/11/2014 - 08/15/2014 Hospital Admission Pt was admitted for fever and nausea, treated for UTI, liver biopsy cancelled due to the infection         INTERVAL HISTORY:   Michelle Erickson returns as scheduled. She completed cycle 8 FOLFOX with Avastin 11/23/2014. She has mild intermittent nausea. No mouth sores. No diarrhea. Cold sensitivity lasts "a few days". No persistent neuropathy symptoms. She denies any bleeding. No shortness of breath or chest pain. No leg swelling or calf pain. No abdominal pain. She has a good appetite.  Objective:  Vital signs in last 24 hours:  Blood pressure 134/65, pulse 81, temperature 98.3 F (36.8 C), temperature source Oral, resp. rate 18, height _0  (1.626 m), weight 157 lb 8 oz (71.442 kg), SpO2 100 %.    HEENT: No thrush or ulcers. Resp: Lungs clear bilaterally. Cardio: Regular rate and rhythm. GI: Abdomen soft and nontender. No hepatomegaly. Vascular: No leg edema. Calves soft and nontender. Neuro:  Vibratory sense mildly to moderately decreased over the fingertips per tuning fork exam.   Port-A-Cath without erythema.    Lab Results:  Lab Results  Component Value Date   WBC 3.7* 12/07/2014   HGB 9.1* 12/07/2014   HCT 28.5* 12/07/2014   MCV 91.6 12/07/2014   PLT 110* 12/07/2014   NEUTROABS 2.0 12/07/2014    Imaging:  No results found.  Medications: I have reviewed the patient's current medications.  Assessment/Plan: 1. Sigmoid colon cancer with liver metastasis, TxNxM1, stage IV, MSI stable, KRAS mutation(+) on active treatment with FOLFOX/Avastin. Restaging CT evaluation 10/10/2014 showed improvement. She has completed 8 cycles. 2. Anemia secondary to bleeding and iron deficiency. Stable. 3. Diabetes mellitus, hypertension 4. History of AKI. 5. Mild thrombocytopenia secondary to chemotherapy. Stable.   Disposition: Michelle Erickson appears stable. She has completed 8 cycles of FOLFOX/Avastin. Plan to proceed with cycle 9 today as scheduled. She will return for follow-up visit and cycle 10 in 2 weeks. She will contact the office in the interim with any problems.  Plan reviewed with Dr. Burr Medico.    Ned Card ANP/GNP-BC   12/07/2014  10:51 AM

## 2014-12-07 NOTE — Patient Instructions (Addendum)
[facility] Discharge Instructions for Patients Receiving Chemotherapy  Today you received the following chemotherapy agents; Oxaliplatin, Leucovorin, and Fluorouracil, and Avastin  To help prevent nausea and vomiting after your treatment, we encourage you to take your nausea medication as directed.    If you develop nausea and vomiting that is not controlled by your nausea medication, call the clinic.   BELOW ARE SYMPTOMS THAT SHOULD BE REPORTED IMMEDIATELY:  *FEVER GREATER THAN 100.5 F  *CHILLS WITH OR WITHOUT FEVER  NAUSEA AND VOMITING THAT IS NOT CONTROLLED WITH YOUR NAUSEA MEDICATION  *UNUSUAL SHORTNESS OF BREATH  *UNUSUAL BRUISING OR BLEEDING  TENDERNESS IN MOUTH AND THROAT WITH OR WITHOUT PRESENCE OF ULCERS  *URINARY PROBLEMS  *BOWEL PROBLEMS  UNUSUAL RASH Items with * indicate a potential emergency and should be followed up as soon as possible.  Feel free to call the clinic you have any questions or concerns. The clinic phone number is [phone].  Please show the [place] [facility] at check-in to the Emergency Department and triage nurse.

## 2014-12-09 ENCOUNTER — Ambulatory Visit (HOSPITAL_BASED_OUTPATIENT_CLINIC_OR_DEPARTMENT_OTHER): Payer: BLUE CROSS/BLUE SHIELD

## 2014-12-09 VITALS — BP 110/60 | HR 88 | Temp 98.8°F | Resp 20

## 2014-12-09 DIAGNOSIS — C189 Malignant neoplasm of colon, unspecified: Secondary | ICD-10-CM | POA: Diagnosis not present

## 2014-12-09 MED ORDER — HEPARIN SOD (PORK) LOCK FLUSH 100 UNIT/ML IV SOLN
500.0000 [IU] | Freq: Once | INTRAVENOUS | Status: AC | PRN
  Administered 2014-12-09: 500 [IU]
  Filled 2014-12-09: qty 5

## 2014-12-09 MED ORDER — SODIUM CHLORIDE 0.9 % IJ SOLN
10.0000 mL | INTRAMUSCULAR | Status: DC | PRN
  Administered 2014-12-09: 10 mL
  Filled 2014-12-09: qty 10

## 2014-12-09 NOTE — Patient Instructions (Signed)

## 2014-12-20 ENCOUNTER — Ambulatory Visit (HOSPITAL_BASED_OUTPATIENT_CLINIC_OR_DEPARTMENT_OTHER): Payer: BLUE CROSS/BLUE SHIELD

## 2014-12-20 ENCOUNTER — Encounter: Payer: Self-pay | Admitting: Hematology

## 2014-12-20 ENCOUNTER — Telehealth: Payer: Self-pay | Admitting: *Deleted

## 2014-12-20 ENCOUNTER — Other Ambulatory Visit (HOSPITAL_BASED_OUTPATIENT_CLINIC_OR_DEPARTMENT_OTHER): Payer: BLUE CROSS/BLUE SHIELD

## 2014-12-20 ENCOUNTER — Ambulatory Visit (HOSPITAL_BASED_OUTPATIENT_CLINIC_OR_DEPARTMENT_OTHER): Payer: BLUE CROSS/BLUE SHIELD | Admitting: Hematology

## 2014-12-20 VITALS — BP 121/55 | HR 86 | Temp 98.2°F | Resp 18 | Ht 64.0 in | Wt 158.0 lb

## 2014-12-20 DIAGNOSIS — C187 Malignant neoplasm of sigmoid colon: Secondary | ICD-10-CM

## 2014-12-20 DIAGNOSIS — C787 Secondary malignant neoplasm of liver and intrahepatic bile duct: Secondary | ICD-10-CM

## 2014-12-20 DIAGNOSIS — C189 Malignant neoplasm of colon, unspecified: Secondary | ICD-10-CM

## 2014-12-20 DIAGNOSIS — Z5112 Encounter for antineoplastic immunotherapy: Secondary | ICD-10-CM

## 2014-12-20 DIAGNOSIS — Z5111 Encounter for antineoplastic chemotherapy: Secondary | ICD-10-CM | POA: Diagnosis not present

## 2014-12-20 DIAGNOSIS — D649 Anemia, unspecified: Secondary | ICD-10-CM

## 2014-12-20 DIAGNOSIS — D696 Thrombocytopenia, unspecified: Secondary | ICD-10-CM

## 2014-12-20 LAB — CBC WITH DIFFERENTIAL/PLATELET
BASO%: 0.8 % (ref 0.0–2.0)
Basophils Absolute: 0 10*3/uL (ref 0.0–0.1)
EOS%: 5.8 % (ref 0.0–7.0)
Eosinophils Absolute: 0.3 10*3/uL (ref 0.0–0.5)
HCT: 30.2 % — ABNORMAL LOW (ref 34.8–46.6)
HGB: 9.5 g/dL — ABNORMAL LOW (ref 11.6–15.9)
LYMPH%: 25.2 % (ref 14.0–49.7)
MCH: 29.1 pg (ref 25.1–34.0)
MCHC: 31.4 g/dL — AB (ref 31.5–36.0)
MCV: 92.6 fL (ref 79.5–101.0)
MONO#: 0.8 10*3/uL (ref 0.1–0.9)
MONO%: 16.5 % — AB (ref 0.0–14.0)
NEUT%: 51.7 % (ref 38.4–76.8)
NEUTROS ABS: 2.4 10*3/uL (ref 1.5–6.5)
Platelets: 128 10*3/uL — ABNORMAL LOW (ref 145–400)
RBC: 3.26 10*6/uL — AB (ref 3.70–5.45)
RDW: 18.5 % — ABNORMAL HIGH (ref 11.2–14.5)
WBC: 4.6 10*3/uL (ref 3.9–10.3)
lymph#: 1.2 10*3/uL (ref 0.9–3.3)

## 2014-12-20 LAB — COMPREHENSIVE METABOLIC PANEL (CC13)
ALT: 42 U/L (ref 0–55)
AST: 30 U/L (ref 5–34)
Albumin: 3.5 g/dL (ref 3.5–5.0)
Alkaline Phosphatase: 95 U/L (ref 40–150)
Anion Gap: 7 mEq/L (ref 3–11)
BUN: 24.1 mg/dL (ref 7.0–26.0)
CHLORIDE: 109 meq/L (ref 98–109)
CO2: 23 meq/L (ref 22–29)
CREATININE: 1.3 mg/dL — AB (ref 0.6–1.1)
Calcium: 9.4 mg/dL (ref 8.4–10.4)
EGFR: 52 mL/min/{1.73_m2} — ABNORMAL LOW (ref >90)
GLUCOSE: 156 mg/dL — AB (ref 70–140)
Potassium: 4.8 mEq/L (ref 3.5–5.1)
SODIUM: 139 meq/L (ref 136–145)
TOTAL PROTEIN: 7 g/dL (ref 6.4–8.3)

## 2014-12-20 MED ORDER — LEUCOVORIN CALCIUM INJECTION 350 MG
400.0000 mg/m2 | Freq: Once | INTRAMUSCULAR | Status: AC
  Administered 2014-12-20: 716 mg via INTRAVENOUS
  Filled 2014-12-20: qty 35.8

## 2014-12-20 MED ORDER — SODIUM CHLORIDE 0.9 % IV SOLN
INTRAVENOUS | Status: DC
Start: 2014-12-20 — End: 2014-12-20
  Administered 2014-12-20: 12:00:00 via INTRAVENOUS

## 2014-12-20 MED ORDER — SODIUM CHLORIDE 0.9 % IV SOLN
5.0000 mg/kg | Freq: Once | INTRAVENOUS | Status: AC
  Administered 2014-12-20: 350 mg via INTRAVENOUS
  Filled 2014-12-20: qty 14

## 2014-12-20 MED ORDER — SODIUM CHLORIDE 0.9 % IV SOLN
Freq: Once | INTRAVENOUS | Status: AC
  Administered 2014-12-20: 12:00:00 via INTRAVENOUS

## 2014-12-20 MED ORDER — OXALIPLATIN CHEMO INJECTION 100 MG/20ML
80.0000 mg/m2 | Freq: Once | INTRAVENOUS | Status: AC
  Administered 2014-12-20: 145 mg via INTRAVENOUS
  Filled 2014-12-20: qty 29

## 2014-12-20 MED ORDER — SODIUM CHLORIDE 0.9 % IV SOLN
2200.0000 mg/m2 | INTRAVENOUS | Status: DC
  Administered 2014-12-20: 3950 mg via INTRAVENOUS
  Filled 2014-12-20: qty 79

## 2014-12-20 MED ORDER — DEXAMETHASONE SODIUM PHOSPHATE 100 MG/10ML IJ SOLN
Freq: Once | INTRAMUSCULAR | Status: AC
  Administered 2014-12-20: 12:00:00 via INTRAVENOUS
  Filled 2014-12-20: qty 4

## 2014-12-20 MED ORDER — DEXTROSE 5 % IV SOLN
Freq: Once | INTRAVENOUS | Status: AC
  Administered 2014-12-20: 13:00:00 via INTRAVENOUS

## 2014-12-20 NOTE — Progress Notes (Signed)
[facility]  Telephone:([phone] [phone]) [phone]  Clinic Follow Up Note   Patient Care Team: Dione Housekeeper, MD as PCP - General (Family Medicine) Tania Ade, RN as Registered Nurse (Oncology) 12/20/2014  CHIEF COMPLAINTS:  Follow-up of metastatic colon cancer  Oncology History   Metastatic colon cancer to liver   Staging form: Colon and Rectum, AJCC 7th Edition     Clinical: Stage Unknown ([place], NX, M1) - Unsigned        Metastatic colon cancer to liver ([place])   07/14/2014 Imaging CT abdomen and pelvis with contrast showed a 6 cm segment of sigmoid bowel wall thickening with adjacent 14 x 12 mm nodules, diffuse hepatic metastasis. No bowel obstruction.   07/14/2014 Initial Diagnosis Metastatic colon cancer to liver   07/25/2014 Procedure Colonoscopy showed a near circumferential medial mass in the sigmoid colon, partially obstructing consistent with carcinoma.   07/25/2014 Initial Biopsy Sigmoid: Biopsy showed invasive adenocarcinoma arising in a background of high-grade dysplasia.   07/28/2014 Imaging CT chest showed no evidence of metastasis   08/09/2014 -  Chemotherapy FOLFOX every 2 weeks    08/11/2014 - 08/15/2014 Hospital Admission Pt was admitted for fever and nausea, treated for UTI, liver biopsy cancelled due to the infection     HISTORY OF PRESENTING ILLNESS:  Michelle Erickson 59 y.o. female is here because of abnoaml CT findings which is highly suspecious for metastatic colon cancer.   She presented intermittent nausea, anorexia and abdominal pain since Nov 2015, she has mild pain in the LUQ, crapy pain, positional (bending over),   it happened once every 2-3 weeks, and it has been more frequent in the past month. She has normal BM, but did notice the caliber of the stool is smaller than before. She denies any melena or hematochezia. She lost about 13 lbs in the past 6 months.   She presents to local emergency  room in November 2015, and May 2016, was felt to be related to gastric virus. Due to the worsening symptoms lately, she went to [facility] ED on 07/13/2014, CT abdomen was obtained which showed multiple large liver metastasis and probable sigmoid colon mass. She was referred to [place] for further workup. She is scheduled to see GI Dr. Olevia Perches this afternoon.  CURRENT THERAPY: mFOLFOX6 and Avastin every 2 weeks, started on 08/09/2014   INTERIM HISTORY Michelle Erickson returns for follow-up and 10th cycle chemo. She is doing very well. She has mild nausea after chemotherapy for which she takes Zofran. No diarrhea, pain, neuropathy or other symptoms. Her appetite is decent, she is well. She denies any fever or chills. Her weight is stable.  MEDICAL HISTORY:  Past Medical History  Diagnosis Date  . Hyperlipemia   . Hypertension   . Metastatic colon cancer to liver ([place]) 07/21/2014  . Wears glasses     contacts/glasses  . Snoring   . Back pain   . Family history of colon cancer   . Diabetes mellitus without complication ([place])     SURGICAL HISTORY: Past Surgical History  Procedure Laterality Date  . Mouth surgery    . Cataract extraction Bilateral   . Portacath placement N/A 08/03/2014    Procedure: INSERTION PORT-A-CATH;  Surgeon: Stark Klein, MD;  Location: [facility];  Service: General;  Laterality: N/A;    SOCIAL HISTORY: Social History   Social History  . Marital Status: Married    Spouse Name: Charlotte Crumb  . Number of Children: 2  .  Years of Education: N/A   Occupational History  . Not on file.   Social History Main Topics  . Smoking status: Former Smoker -- 1.00 packs/day for 6 years    Types: Cigarettes    Quit date: 01/21/1976  . Smokeless tobacco: Not on file  . Alcohol Use: No  . Drug Use: No  . Sexual Activity: Yes    Birth Control/ Protection: Post-menopausal     Comment: # 2 pregnancies and #2 live births   Other Topics Concern  . Not on file   Social History Narrative    Married, husband [name]   Has #2 adult children   Previously worked at [employer] HISTORY: Family History  Problem Relation Age of Onset  . COPD Father   . Heart attack Father   . Arthritis Sister   . Diabetes Sister   . Colon cancer Sister     dx over 35  . Colon polyps Sister   . Seizures Brother   . Colon cancer Sister     dx in her early 46s, died in her early to mid 75s  . Diabetes Mother     ALLERGIES:  has No Known Allergies.  MEDICATIONS:  Current Outpatient Prescriptions  Medication Sig Dispense Refill  . amLODipine (NORVASC) 5 MG tablet Take 2 tablets (10 mg total) by mouth daily. 30 tablet 0  . atorvastatin (LIPITOR) 40 MG tablet Take 40 mg by mouth daily.    . cetirizine (ZYRTEC) 10 MG tablet Take 10 mg by mouth daily as needed for allergies.    . ferrous sulfate 325 (65 FE) MG tablet Take 325 mg by mouth daily with breakfast.    . HUMALOG KWIKPEN 100 UNIT/ML KiwkPen     . Insulin Glargine (LANTUS SOLOSTAR) 100 UNIT/ML Solostar Pen Inject 23 Units into the skin daily at 10 pm.    . insulin lispro (HUMALOG) 100 UNIT/ML injection Inject 4-8 Units into the skin 2 (two) times daily as needed for high blood sugar. Sliding scale    . lidocaine-prilocaine (EMLA) cream     . ondansetron (ZOFRAN-ODT) 8 MG disintegrating tablet Take 8 mg by mouth every 8 (eight) hours as needed for nausea or vomiting.     No current facility-administered medications for this visit.    REVIEW OF SYSTEMS:   Constitutional: Denies fevers, chills or abnormal night sweats Eyes: Denies blurriness of vision, double vision or watery eyes Ears, nose, mouth, throat, and face: Denies mucositis or sore throat Respiratory: Denies cough, dyspnea or wheezes Cardiovascular: Denies palpitation, chest discomfort or lower extremity swelling Gastrointestinal:  Denies nausea, heartburn or change in bowel habits Skin: Denies abnormal skin rashes Lymphatics: Denies new lymphadenopathy or  easy bruising Neurological:Denies numbness, tingling or new weaknesses Behavioral/Psych: Mood is stable, no new changes  All other systems were reviewed with the patient and are negative.  PHYSICAL EXAMINATION: ECOG PERFORMANCE STATUS: 1 - Symptomatic but completely ambulatory BP 121/55 mmHg  Pulse 86  Temp(Src) 98.2 F (36.8 C) (Oral)  Resp 18  Ht $R'5\' 4"'zn$  (1.626 m)  Wt 158 lb (71.668 kg)  BMI 27.11 kg/m2  SpO2 100% GENERAL:alert, no distress and comfortable SKIN: skin color, texture, turgor are normal, no rashes or significant lesions EYES: normal, conjunctiva are pink and non-injected, sclera clear OROPHARYNX:no exudate, no erythema and lips, buccal mucosa, and tongue normal  NECK: supple, thyroid normal size, non-tender, without nodularity LYMPH:  no palpable lymphadenopathy in the cervical, axillary or  inguinal LUNGS: clear to auscultation and percussion with normal breathing effort HEART: regular rate & rhythm and no murmurs and no lower extremity edema ABDOMEN:abdomen soft, non-tender and normal bowel sounds Musculoskeletal:no cyanosis of digits and no clubbing  PSYCH: alert & oriented x 3 with fluent speech NEURO: no focal motor/sensory deficits  LABORATORY DATA:  I have reviewed the data as listed CBC Latest Ref Rng 12/20/2014 12/07/2014 11/23/2014  WBC 3.9 - 10.3 10e3/uL 4.6 3.7(L) 3.8(L)  Hemoglobin 11.6 - 15.9 g/dL 9.5(L) 9.1(L) 10.0(L)  Hematocrit 34.8 - 46.6 % 30.2(L) 28.5(L) 31.3(L)  Platelets 145 - 400 10e3/uL 128(L) 110(L) 103(L)     Recent Labs  07/13/14 2051  08/11/14 2047 08/13/14 0558 08/14/14 0435  11/23/14 0843 12/07/14 0927 12/20/14 1023  NA  --   < > 133* 138 135  < > 142 140 139  K  --   < > 4.3 4.0 4.0  < > 4.9 4.6 4.8  CL  --   < > 98* 109 105  --   --   --   --   CO2  --   < > $R'26 26 25  'sa$ < > $R'24 24 23  'ZL$ GLUCOSE  --   < > 97 110* 138*  < > 86 131 156*  BUN  --   < > 22* 18 18  < > 21.3 17.3 24.1  CREATININE  --   < > 0.98 1.04* 0.94  < > 1.2*  1.0 1.3*  CALCIUM  --   < > 8.8* 8.2* 8.3*  < > 9.9 9.4 9.4  GFRNONAA  --   < > >60 57* >60  --   --   --   --   GFRAA  --   < > >60 >60 >60  --   --   --   --   PROT 7.6  < > 7.6 5.9* 6.5  < > 7.5 6.7 7.0  ALBUMIN 3.8  < > 3.7 2.7* 2.9*  < > 3.8 3.5 3.5  AST 65*  < > 70* 31 30  < > 34 50* 30  ALT 54  < > 51 28 28  < > 44 49 42  ALKPHOS 146*  < > 178* 134* 146*  < > 115 110 95  BILITOT 0.9  < > 0.7 1.0 0.7  < > <0.30 <0.30 <0.30  BILIDIR 0.2  --   --   --  0.1  --   --   --   --   IBILI 0.7  --   --   --  0.6  --   --   --   --   < > = values in this interval not displayed.  CEA  Status: Finalresult Visible to patient:  Not Released Nextappt: 12/07/2014 at 09:45 AM in Oncology ([facility]) Dx:  Metastatic colon cancer to liver ([place])              Ref Range 2d ago (11/23/14) 98mo ago (10/26/14) 40mo ago (09/28/14) 53mo ago (09/07/14)    CEA 0.0 - 5.0 ng/mL 18.2 (H) 54.6 (H) 182.4 (H)CM 555.1 (H)CM         PATHOLOGY REPORTS: Diagnosis 07/25/2014  Surgical [P], sigmoid - INVASIVE ADENOCARCINOMA ARISING IN A BACKGROUND OF HIGH GRADE DYSPLASIA. - SEE COMMENT. Microscopic Comment Results are phoned to Dr. Olevia Perches. Internal departmental review obtained (Dr. Donato Heinz) with agreement.  Diagnosis 09/05/2014 Liver, biopsy - METASTATIC ADENOCARCINOMA, SEE COMMENT. Microscopic Comment Given  the patient's history of sigmoid invasive adenocarcinoma and the morphology, the findings are consistent with metastatic colorectal adenocarcinoma. The case was called to Dr. Burr Medico. Per request, MMR, MSI, and KRAS testing will be ordered.  KRAS MUTATION (+) c36G>A (p. G12D)  RADIOGRAPHIC STUDIES: I have personally reviewed the radiological images as listed and agreed with the findings in the report.   CT chest, abdomen and pelvis with contrast 10/10/2014 IMPRESSION: Interval decrease in size of multiple hepatic metastatic lesions.  Interval decrease in size of soft tissue mass  adjacent to the sigmoid colon.  Interval decrease in wall thickening of the sigmoid colon.   Colonoscopy 07/25/2014 Dr. Olevia Perches  ENDOSCOPIC IMPRESSION: Near circumferential medium mass was found in the sigmoid colon; multiple biopsies were performed using cold forceps, partially obstructing mass consistent with carcinoma. Placement of endoclips and tattoo at the margins of the mass which extends from 18-25 cm from the anus  ASSESSMENT & PLAN: 59 year old African-American female, presented with intermittent nausea vomiting and anemia.   1. Sigmoid colon cancer with liver metastasis, TxNxM1, stage IV, MSI stable, KRAS mutation(+) -I previously reviewed her CT scans, colonoscopy, and colon mass biopsy findings with patient and her husband in great details, images were reviewed in person  -I reviewed her liver biopsy results, which confirmed metastasis from colon cancer. -The natural history of metastatic colon cancer and treatment options were discussed with patient and her husband. Giving the diffuse metastasis in the liver, this is unfortunately incurable disease. I recommend systemic chemotherapy to control her metastatic disease, the goal of therapy is palliative and to prolong her life. -The options of first-line chemotherapy were discussed with her, I recommend FOLFOX and Avastin every 2 weeks intravenously, and I will continue as long as she tolerates and no disease progression -her tumor MSI is stable, KRAS mutated, not a candidate for immunotherapy or EGFR inhibitor. -She is tolerating chemotherapy well, CEA has dropped significantly, restaging CT scan showed partial response. We'll continue chemotherapy  -Lab reviewed, mild thrombocytopenia and leukopenia, ANC 2.4 today, anemia stable, Adequate for Chemotherapy, Will Proceed with Cycle 10 today.  2. Anemia -Likely secondary to tumor bleeding and iron deficiency -stable, Hb 9.5 today, not symptomatic  -Her ferritin is normal, serum iron  and saturation slightly low, TIBC low normal, I suggest her to take oral iron supplement, she tolerates well, we'll continue once daily, with orange juice or vitamin C   3. DM, HTN -Continue follow-up with PCP - we discussed the potential impact of chemotherapy and premedication dexamethasone on her blood pressure and sugar,will  Monitor closely. Her BG has been well controlled  -Her blood pressure has been much better controlled after we increased her Amlodipine to $RemoveBefor'10mg'nbatqwfZYxNv$  daily   4. AKI -resolved, but Cr slightly increased to 1.3 today, I encouraged her to drink more fluids, will give NS 53ml with chemo today   5. Mild thrombocytopenia -Secondary to chemotherapy. -will monitor closely.  PLan -cycle 10 FOLFOX plus Avastin today, no Neulasta on day 3 -NS 530ml today  -Due to the holiday, she wished to postpone her cycle 12 for 1 week,  I'll see her back in 5 weeks before cycle 12, restaging after cycle 12   All questions were answered. The patient knows to call the clinic with any problems, questions or concerns. I spent 20 minutes counseling the patient face to face. The total time spent in the appointment was 30 minutes and more than 50% was on counseling.     Truitt Merle,  MD 12/20/2014 11:30 AM

## 2014-12-20 NOTE — Patient Instructions (Signed)
[facility] Discharge Instructions for Patients Receiving Chemotherapy  Today you received the following chemotherapy agents Avastin/5 Fu/Leucovorin/Oxaliplatin To help prevent nausea and vomiting after your treatment, we encourage you to take your nausea medication as prescribed.  If you develop nausea and vomiting that is not controlled by your nausea medication, call the clinic.   BELOW ARE SYMPTOMS THAT SHOULD BE REPORTED IMMEDIATELY:  *FEVER GREATER THAN 100.5 F  *CHILLS WITH OR WITHOUT FEVER  NAUSEA AND VOMITING THAT IS NOT CONTROLLED WITH YOUR NAUSEA MEDICATION  *UNUSUAL SHORTNESS OF BREATH  *UNUSUAL BRUISING OR BLEEDING  TENDERNESS IN MOUTH AND THROAT WITH OR WITHOUT PRESENCE OF ULCERS  *URINARY PROBLEMS  *BOWEL PROBLEMS  UNUSUAL RASH Items with * indicate a potential emergency and should be followed up as soon as possible.  Feel free to call the clinic you have any questions or concerns. The clinic phone number is [phone].  Please show the [facility] at check-in to the Emergency Department and triage nurse.

## 2014-12-20 NOTE — Telephone Encounter (Signed)
Per staff message and POF I have scheduled appts. Advised scheduler of appts. JMW  

## 2014-12-22 ENCOUNTER — Ambulatory Visit (HOSPITAL_BASED_OUTPATIENT_CLINIC_OR_DEPARTMENT_OTHER): Payer: BLUE CROSS/BLUE SHIELD

## 2014-12-22 ENCOUNTER — Telehealth: Payer: Self-pay | Admitting: Hematology

## 2014-12-22 VITALS — BP 123/60 | HR 92 | Temp 98.0°F

## 2014-12-22 DIAGNOSIS — C189 Malignant neoplasm of colon, unspecified: Secondary | ICD-10-CM | POA: Diagnosis not present

## 2014-12-22 DIAGNOSIS — Z452 Encounter for adjustment and management of vascular access device: Secondary | ICD-10-CM | POA: Diagnosis not present

## 2014-12-22 DIAGNOSIS — C787 Secondary malignant neoplasm of liver and intrahepatic bile duct: Principal | ICD-10-CM

## 2014-12-22 MED ORDER — HEPARIN SOD (PORK) LOCK FLUSH 100 UNIT/ML IV SOLN
500.0000 [IU] | Freq: Once | INTRAVENOUS | Status: AC | PRN
  Administered 2014-12-22: 500 [IU]
  Filled 2014-12-22: qty 5

## 2014-12-22 MED ORDER — SODIUM CHLORIDE 0.9 % IJ SOLN
10.0000 mL | INTRAMUSCULAR | Status: DC | PRN
  Administered 2014-12-22: 10 mL
  Filled 2014-12-22: qty 10

## 2014-12-22 NOTE — Telephone Encounter (Signed)
January appointments complete per 11/30 pof. Spoke with patient and she will get new schedule 12/14

## 2015-01-03 ENCOUNTER — Other Ambulatory Visit (HOSPITAL_BASED_OUTPATIENT_CLINIC_OR_DEPARTMENT_OTHER): Payer: BLUE CROSS/BLUE SHIELD

## 2015-01-03 ENCOUNTER — Ambulatory Visit (HOSPITAL_BASED_OUTPATIENT_CLINIC_OR_DEPARTMENT_OTHER): Payer: BLUE CROSS/BLUE SHIELD

## 2015-01-03 ENCOUNTER — Other Ambulatory Visit: Payer: Self-pay | Admitting: Hematology

## 2015-01-03 VITALS — BP 156/63 | HR 81 | Temp 98.1°F | Resp 24

## 2015-01-03 DIAGNOSIS — Z5112 Encounter for antineoplastic immunotherapy: Secondary | ICD-10-CM

## 2015-01-03 DIAGNOSIS — C187 Malignant neoplasm of sigmoid colon: Secondary | ICD-10-CM

## 2015-01-03 DIAGNOSIS — C787 Secondary malignant neoplasm of liver and intrahepatic bile duct: Secondary | ICD-10-CM | POA: Diagnosis not present

## 2015-01-03 DIAGNOSIS — C189 Malignant neoplasm of colon, unspecified: Secondary | ICD-10-CM

## 2015-01-03 DIAGNOSIS — Z5111 Encounter for antineoplastic chemotherapy: Secondary | ICD-10-CM

## 2015-01-03 LAB — CBC WITH DIFFERENTIAL/PLATELET
BASO%: 0.2 % (ref 0.0–2.0)
BASOS ABS: 0 10*3/uL (ref 0.0–0.1)
EOS%: 5.6 % (ref 0.0–7.0)
Eosinophils Absolute: 0.3 10*3/uL (ref 0.0–0.5)
HEMATOCRIT: 29.1 % — AB (ref 34.8–46.6)
HGB: 9.3 g/dL — ABNORMAL LOW (ref 11.6–15.9)
LYMPH#: 1.2 10*3/uL (ref 0.9–3.3)
LYMPH%: 25.8 % (ref 14.0–49.7)
MCH: 29.8 pg (ref 25.1–34.0)
MCHC: 32 g/dL (ref 31.5–36.0)
MCV: 93.3 fL (ref 79.5–101.0)
MONO#: 0.7 10*3/uL (ref 0.1–0.9)
MONO%: 14.1 % — ABNORMAL HIGH (ref 0.0–14.0)
NEUT#: 2.5 10*3/uL (ref 1.5–6.5)
NEUT%: 54.3 % (ref 38.4–76.8)
PLATELETS: 97 10*3/uL — AB (ref 145–400)
RBC: 3.12 10*6/uL — AB (ref 3.70–5.45)
RDW: 17.2 % — AB (ref 11.2–14.5)
WBC: 4.6 10*3/uL (ref 3.9–10.3)

## 2015-01-03 LAB — COMPREHENSIVE METABOLIC PANEL
ALBUMIN: 3.6 g/dL (ref 3.5–5.0)
ALT: 36 U/L (ref 0–55)
ANION GAP: 8 meq/L (ref 3–11)
AST: 27 U/L (ref 5–34)
Alkaline Phosphatase: 94 U/L (ref 40–150)
BILIRUBIN TOTAL: 0.32 mg/dL (ref 0.20–1.20)
BUN: 15.8 mg/dL (ref 7.0–26.0)
CO2: 23 meq/L (ref 22–29)
CREATININE: 1.2 mg/dL — AB (ref 0.6–1.1)
Calcium: 9.4 mg/dL (ref 8.4–10.4)
Chloride: 106 mEq/L (ref 98–109)
EGFR: 56 mL/min/{1.73_m2} — ABNORMAL LOW (ref >90)
GLUCOSE: 172 mg/dL — AB (ref 70–140)
Potassium: 4.7 mEq/L (ref 3.5–5.1)
Sodium: 137 mEq/L (ref 136–145)
TOTAL PROTEIN: 7 g/dL (ref 6.4–8.3)

## 2015-01-03 LAB — CEA: CEA: 6.6 ng/mL — ABNORMAL HIGH (ref 0.0–5.0)

## 2015-01-03 MED ORDER — OXALIPLATIN CHEMO INJECTION 100 MG/20ML
75.0000 mg/m2 | Freq: Once | INTRAVENOUS | Status: AC
  Administered 2015-01-03: 135 mg via INTRAVENOUS
  Filled 2015-01-03: qty 27

## 2015-01-03 MED ORDER — SODIUM CHLORIDE 0.9 % IV SOLN
2200.0000 mg/m2 | INTRAVENOUS | Status: DC
  Administered 2015-01-03: 3950 mg via INTRAVENOUS
  Filled 2015-01-03: qty 79

## 2015-01-03 MED ORDER — SODIUM CHLORIDE 0.9 % IV SOLN
5.0000 mg/kg | Freq: Once | INTRAVENOUS | Status: AC
  Administered 2015-01-03: 350 mg via INTRAVENOUS
  Filled 2015-01-03: qty 14

## 2015-01-03 MED ORDER — DEXTROSE 5 % IV SOLN
Freq: Once | INTRAVENOUS | Status: AC
  Administered 2015-01-03: 12:00:00 via INTRAVENOUS

## 2015-01-03 MED ORDER — HEPARIN SOD (PORK) LOCK FLUSH 100 UNIT/ML IV SOLN
500.0000 [IU] | Freq: Once | INTRAVENOUS | Status: DC | PRN
  Filled 2015-01-03: qty 5

## 2015-01-03 MED ORDER — SODIUM CHLORIDE 0.9 % IJ SOLN
10.0000 mL | INTRAMUSCULAR | Status: DC | PRN
  Filled 2015-01-03: qty 10

## 2015-01-03 MED ORDER — SODIUM CHLORIDE 0.9 % IV SOLN
Freq: Once | INTRAVENOUS | Status: AC
  Administered 2015-01-03: 11:00:00 via INTRAVENOUS
  Filled 2015-01-03: qty 4

## 2015-01-03 MED ORDER — LEUCOVORIN CALCIUM INJECTION 350 MG
400.0000 mg/m2 | Freq: Once | INTRAVENOUS | Status: AC
  Administered 2015-01-03: 716 mg via INTRAVENOUS
  Filled 2015-01-03: qty 35.8

## 2015-01-03 MED ORDER — SODIUM CHLORIDE 0.9 % IV SOLN
Freq: Once | INTRAVENOUS | Status: AC
  Administered 2015-01-03: 11:00:00 via INTRAVENOUS

## 2015-01-03 MED ORDER — SODIUM CHLORIDE 0.9 % IV SOLN
INTRAVENOUS | Status: AC
  Administered 2015-01-03: 11:00:00 via INTRAVENOUS

## 2015-01-03 NOTE — Progress Notes (Signed)
S/w Dr Burr Medico re: platelets and sr creatinine. She will decrease oxaliplatin dosage and give pt 500 NS during treatment.

## 2015-01-03 NOTE — Patient Instructions (Signed)
[facility] Discharge Instructions for Patients Receiving Chemotherapy  Today you received the following chemotherapy agents: Avastin, Oxaliplatin, Leucovorin, 5FU.  To help prevent nausea and vomiting after your treatment, we encourage you to take your nausea medication: Zofran 8 mg every 8 hours as needed.   If you develop nausea and vomiting that is not controlled by your nausea medication, call the clinic.   BELOW ARE SYMPTOMS THAT SHOULD BE REPORTED IMMEDIATELY:  *FEVER GREATER THAN 100.5 F  *CHILLS WITH OR WITHOUT FEVER  NAUSEA AND VOMITING THAT IS NOT CONTROLLED WITH YOUR NAUSEA MEDICATION  *UNUSUAL SHORTNESS OF BREATH  *UNUSUAL BRUISING OR BLEEDING  TENDERNESS IN MOUTH AND THROAT WITH OR WITHOUT PRESENCE OF ULCERS  *URINARY PROBLEMS  *BOWEL PROBLEMS  UNUSUAL RASH Items with * indicate a potential emergency and should be followed up as soon as possible.  Feel free to call the clinic you have any questions or concerns. The clinic phone number is [phone].  Please show the CHEMO ALERT CARD at check-in to the Emergency Department and triage nurse.   

## 2015-01-05 ENCOUNTER — Ambulatory Visit (HOSPITAL_BASED_OUTPATIENT_CLINIC_OR_DEPARTMENT_OTHER): Payer: BLUE CROSS/BLUE SHIELD

## 2015-01-05 VITALS — BP 140/64 | HR 87 | Temp 97.9°F | Resp 20

## 2015-01-05 DIAGNOSIS — C189 Malignant neoplasm of colon, unspecified: Secondary | ICD-10-CM | POA: Diagnosis not present

## 2015-01-05 DIAGNOSIS — C787 Secondary malignant neoplasm of liver and intrahepatic bile duct: Principal | ICD-10-CM

## 2015-01-05 DIAGNOSIS — Z452 Encounter for adjustment and management of vascular access device: Secondary | ICD-10-CM

## 2015-01-05 MED ORDER — HEPARIN SOD (PORK) LOCK FLUSH 100 UNIT/ML IV SOLN
500.0000 [IU] | Freq: Once | INTRAVENOUS | Status: AC | PRN
  Administered 2015-01-05: 500 [IU]
  Filled 2015-01-05: qty 5

## 2015-01-05 MED ORDER — SODIUM CHLORIDE 0.9 % IJ SOLN
10.0000 mL | INTRAMUSCULAR | Status: DC | PRN
  Administered 2015-01-05: 10 mL
  Filled 2015-01-05: qty 10

## 2015-01-14 ENCOUNTER — Other Ambulatory Visit: Payer: Self-pay | Admitting: Hematology

## 2015-01-24 ENCOUNTER — Telehealth: Payer: Self-pay | Admitting: Hematology

## 2015-01-24 ENCOUNTER — Encounter: Payer: Self-pay | Admitting: Hematology

## 2015-01-24 ENCOUNTER — Ambulatory Visit (HOSPITAL_BASED_OUTPATIENT_CLINIC_OR_DEPARTMENT_OTHER): Payer: BLUE CROSS/BLUE SHIELD

## 2015-01-24 ENCOUNTER — Ambulatory Visit (HOSPITAL_BASED_OUTPATIENT_CLINIC_OR_DEPARTMENT_OTHER): Payer: BLUE CROSS/BLUE SHIELD | Admitting: Hematology

## 2015-01-24 ENCOUNTER — Other Ambulatory Visit (HOSPITAL_BASED_OUTPATIENT_CLINIC_OR_DEPARTMENT_OTHER): Payer: BLUE CROSS/BLUE SHIELD

## 2015-01-24 ENCOUNTER — Telehealth: Payer: Self-pay | Admitting: *Deleted

## 2015-01-24 VITALS — BP 130/66 | HR 90 | Temp 98.2°F | Resp 18 | Wt 158.8 lb

## 2015-01-24 DIAGNOSIS — D6959 Other secondary thrombocytopenia: Secondary | ICD-10-CM

## 2015-01-24 DIAGNOSIS — C189 Malignant neoplasm of colon, unspecified: Secondary | ICD-10-CM

## 2015-01-24 DIAGNOSIS — C187 Malignant neoplasm of sigmoid colon: Secondary | ICD-10-CM | POA: Diagnosis not present

## 2015-01-24 DIAGNOSIS — Z5112 Encounter for antineoplastic immunotherapy: Secondary | ICD-10-CM

## 2015-01-24 DIAGNOSIS — C787 Secondary malignant neoplasm of liver and intrahepatic bile duct: Secondary | ICD-10-CM

## 2015-01-24 DIAGNOSIS — T451X5A Adverse effect of antineoplastic and immunosuppressive drugs, initial encounter: Secondary | ICD-10-CM

## 2015-01-24 DIAGNOSIS — I10 Essential (primary) hypertension: Secondary | ICD-10-CM

## 2015-01-24 DIAGNOSIS — D630 Anemia in neoplastic disease: Secondary | ICD-10-CM

## 2015-01-24 DIAGNOSIS — E119 Type 2 diabetes mellitus without complications: Secondary | ICD-10-CM

## 2015-01-24 DIAGNOSIS — Z5111 Encounter for antineoplastic chemotherapy: Secondary | ICD-10-CM

## 2015-01-24 LAB — CBC WITH DIFFERENTIAL/PLATELET
BASO%: 0.4 % (ref 0.0–2.0)
BASOS ABS: 0 10*3/uL (ref 0.0–0.1)
EOS%: 7.7 % — AB (ref 0.0–7.0)
Eosinophils Absolute: 0.4 10*3/uL (ref 0.0–0.5)
HEMATOCRIT: 31.2 % — AB (ref 34.8–46.6)
HGB: 9.8 g/dL — ABNORMAL LOW (ref 11.6–15.9)
LYMPH#: 1.7 10*3/uL (ref 0.9–3.3)
LYMPH%: 37.2 % (ref 14.0–49.7)
MCH: 29.8 pg (ref 25.1–34.0)
MCHC: 31.4 g/dL — AB (ref 31.5–36.0)
MCV: 94.8 fL (ref 79.5–101.0)
MONO#: 1 10*3/uL — ABNORMAL HIGH (ref 0.1–0.9)
MONO%: 21.2 % — ABNORMAL HIGH (ref 0.0–14.0)
NEUT#: 1.5 10*3/uL (ref 1.5–6.5)
NEUT%: 33.5 % — AB (ref 38.4–76.8)
Platelets: 132 10*3/uL — ABNORMAL LOW (ref 145–400)
RBC: 3.29 10*6/uL — AB (ref 3.70–5.45)
RDW: 16.3 % — ABNORMAL HIGH (ref 11.2–14.5)
WBC: 4.6 10*3/uL (ref 3.9–10.3)

## 2015-01-24 LAB — COMPREHENSIVE METABOLIC PANEL
ALT: 23 U/L (ref 0–55)
ANION GAP: 8 meq/L (ref 3–11)
AST: 25 U/L (ref 5–34)
Albumin: 3.8 g/dL (ref 3.5–5.0)
Alkaline Phosphatase: 89 U/L (ref 40–150)
BILIRUBIN TOTAL: 0.3 mg/dL (ref 0.20–1.20)
BUN: 30.9 mg/dL — AB (ref 7.0–26.0)
CALCIUM: 9.7 mg/dL (ref 8.4–10.4)
CO2: 26 mEq/L (ref 22–29)
CREATININE: 1.1 mg/dL (ref 0.6–1.1)
Chloride: 106 mEq/L (ref 98–109)
EGFR: 66 mL/min/{1.73_m2} — ABNORMAL LOW (ref >90)
Glucose: 66 mg/dl — ABNORMAL LOW (ref 70–140)
POTASSIUM: 4.4 meq/L (ref 3.5–5.1)
Sodium: 141 mEq/L (ref 136–145)
Total Protein: 7.5 g/dL (ref 6.4–8.3)

## 2015-01-24 LAB — UA PROTEIN, DIPSTICK - CHCC: PROTEIN: 30 mg/dL

## 2015-01-24 LAB — WHOLE BLOOD GLUCOSE
Glucose: 140 mg/dL — ABNORMAL HIGH (ref 70–100)
HRS PC: 0.5 Hours

## 2015-01-24 MED ORDER — LEUCOVORIN CALCIUM INJECTION 100 MG
20.0000 mg/m2 | Freq: Once | INTRAMUSCULAR | Status: AC
  Administered 2015-01-24: 36 mg via INTRAVENOUS
  Filled 2015-01-24: qty 1.8

## 2015-01-24 MED ORDER — SODIUM CHLORIDE 0.9 % IV SOLN
Freq: Once | INTRAVENOUS | Status: AC
  Administered 2015-01-24: 09:00:00 via INTRAVENOUS

## 2015-01-24 MED ORDER — SODIUM CHLORIDE 0.9 % IV SOLN
2200.0000 mg/m2 | INTRAVENOUS | Status: DC
  Administered 2015-01-24: 3950 mg via INTRAVENOUS
  Filled 2015-01-24: qty 79

## 2015-01-24 MED ORDER — SODIUM CHLORIDE 0.9 % IV SOLN
5.0000 mg/kg | Freq: Once | INTRAVENOUS | Status: AC
  Administered 2015-01-24: 350 mg via INTRAVENOUS
  Filled 2015-01-24: qty 14

## 2015-01-24 MED ORDER — SODIUM CHLORIDE 0.9 % IV SOLN
Freq: Once | INTRAVENOUS | Status: AC
  Administered 2015-01-24: 10:00:00 via INTRAVENOUS
  Filled 2015-01-24: qty 4

## 2015-01-24 MED ORDER — OXALIPLATIN CHEMO INJECTION 100 MG/20ML
70.0000 mg/m2 | Freq: Once | INTRAVENOUS | Status: AC
  Administered 2015-01-24: 125 mg via INTRAVENOUS
  Filled 2015-01-24: qty 25

## 2015-01-24 MED ORDER — DEXTROSE 5 % IV SOLN
Freq: Once | INTRAVENOUS | Status: AC
  Administered 2015-01-24: 10:00:00 via INTRAVENOUS

## 2015-01-24 NOTE — Progress Notes (Signed)
Michelle Erickson  [phone] [phone]  Clinic Follow Up Note   Patient Care Team: Michelle Housekeeper, MD as PCP - General (Family Medicine) Michelle Ade, RN as Registered Nurse (Oncology) 01/24/2015  CHIEF COMPLAINTS:  Follow-up of metastatic colon cancer  Oncology History   Metastatic colon cancer to liver   Staging form: Colon and Rectum, AJCC 7th Edition     Clinical: Stage Unknown ([place], NX, M1) - Unsigned        Metastatic colon cancer to liver ([place])   07/14/2014 Imaging CT abdomen and pelvis with contrast showed a 6 cm segment of sigmoid bowel wall thickening with adjacent 14 x 12 mm nodules, diffuse hepatic metastasis. No bowel obstruction.   07/14/2014 Initial Diagnosis Metastatic colon cancer to liver   07/25/2014 Procedure Colonoscopy showed a near circumferential medial mass in the sigmoid colon, partially obstructing consistent with carcinoma.   07/25/2014 Initial Biopsy Sigmoid: Biopsy showed invasive adenocarcinoma arising in a background of high-grade dysplasia.   07/28/2014 Imaging CT chest showed no evidence of metastasis   08/09/2014 -  Chemotherapy mFOLFOX6 every 2 weeks, dose reduction from cycle 7 due to cytopenia, Avastin added from cycle 3   08/11/2014 - 08/15/2014 Hospital Admission Pt was admitted for fever and nausea, treated for UTI, liver biopsy cancelled due to the infection     HISTORY OF PRESENTING ILLNESS:  Michelle Erickson 60 y.o. female is here because of abnoaml CT findings which is highly suspecious for metastatic colon cancer.   She presented intermittent nausea, anorexia and abdominal pain since Nov 2015, she has mild pain in the LUQ, crapy pain, positional (bending over),   it happened once every 2-3 weeks, and it has been more frequent in the past month. She has normal BM, but did notice the caliber of the stool is smaller than before. She denies any melena or hematochezia. She lost  about 13 lbs in the past 6 months.   She presents to local emergency room in November 2015, and May 2016, was felt to be related to gastric virus. Due to the worsening symptoms lately, she went to [facility] ED on 07/13/2014, CT abdomen was obtained which showed multiple large liver metastasis and probable sigmoid colon mass. She was referred to [place] for further workup. She is scheduled to see GI Dr. Olevia Erickson this afternoon.  CURRENT THERAPY: mFOLFOX6 and Avastin every 2 weeks, started on 08/09/2014   INTERIM HISTORY Michelle Erickson returns for follow-up and 12th cycle chemo. She is doing very well. She took a extra week off for chemotherapy during the holiday, and she really enjoyed her holidays. She still has some cold sensitivity, but no significant numbness or tingling of her fingers and toes. She has great appetite, eating well, and gained about 5 pounds in the past few months. She denies any pain, nausea, or other symptoms. She functions very well at home.  MEDICAL HISTORY:  Past Medical History  Diagnosis Date  . Hyperlipemia   . Hypertension   . Metastatic colon cancer to liver ([place]) 07/21/2014  . Wears glasses     contacts/glasses  . Snoring   . Back pain   . Family history of colon cancer   . Diabetes mellitus without complication ([place])     SURGICAL HISTORY: Past Surgical History  Procedure Laterality Date  . Mouth surgery    . Cataract extraction Bilateral   . Portacath placement N/A 08/03/2014    Procedure: INSERTION PORT-A-CATH;  Surgeon: Michelle Klein,  MD;  Location: [place];  Service: General;  Laterality: N/A;    SOCIAL HISTORY: Social History   Social History  . Marital Status: Married    Spouse Name: Michelle Erickson  . Number of Children: 2  . Years of Education: N/A   Occupational History  . Not on file.   Social History Main Topics  . Smoking status: Former Smoker -- 1.00 packs/day for 6 years    Types: Cigarettes    Quit date: 01/21/1976  . Smokeless tobacco: Not on  file  . Alcohol Use: No  . Drug Use: No  . Sexual Activity: Yes    Birth Control/ Protection: Post-menopausal     Comment: # 2 pregnancies and #2 live births   Other Topics Concern  . Not on file   Social History Narrative   Married, husband Michelle Erickson   Has #2 adult children   Previously worked at [employer] HISTORY: Family History  Problem Relation Age of Onset  . COPD Father   . Heart attack Father   . Arthritis Sister   . Diabetes Sister   . Colon cancer Sister     dx over 47  . Colon polyps Sister   . Seizures Brother   . Colon cancer Sister     dx in her early 79s, died in her early to mid 63s  . Diabetes Mother     ALLERGIES:  has No Known Allergies.  MEDICATIONS:  Current Outpatient Prescriptions  Medication Sig Dispense Refill  . amLODipine (NORVASC) 5 MG tablet Take 2 tablets (10 mg total) by mouth daily. 30 tablet 0  . atorvastatin (LIPITOR) 40 MG tablet Take 40 mg by mouth daily.    . cetirizine (ZYRTEC) 10 MG tablet Take 10 mg by mouth daily as needed for allergies.    . ferrous sulfate 325 (65 FE) MG tablet Take 325 mg by mouth daily with breakfast.    . HUMALOG KWIKPEN 100 UNIT/ML KiwkPen     . Insulin Glargine (LANTUS SOLOSTAR) 100 UNIT/ML Solostar Pen Inject 23 Units into the skin daily at 10 pm.    . insulin lispro (HUMALOG) 100 UNIT/ML injection Inject 4-8 Units into the skin 2 (two) times daily as needed for high blood sugar. Sliding scale    . lidocaine-prilocaine (EMLA) cream     . ondansetron (ZOFRAN-ODT) 8 MG disintegrating tablet Take 8 mg by mouth every 8 (eight) hours as needed for nausea or vomiting.     No current facility-administered medications for this visit.   Facility-Administered Medications Ordered in Other Visits  Medication Dose Route Frequency Provider Last Rate Last Dose  . bevacizumab (AVASTIN) 350 mg in sodium chloride 0.9 % 100 mL chemo infusion  5 mg/kg (Treatment Plan Actual) Intravenous Once Michelle Merle, MD       . dextrose 5 % solution   Intravenous Once Michelle Merle, MD      . fluorouracil (ADRUCIL) 3,950 mg in sodium chloride 0.9 % 71 mL chemo infusion  2,200 mg/m2 (Treatment Plan Actual) Intravenous 1 day or 1 dose Michelle Merle, MD      . leucovorin injection 36 mg  20 mg/m2 (Treatment Plan Actual) Intravenous Once Michelle Merle, MD      . oxaliplatin (ELOXATIN) 125 mg in dextrose 5 % 500 mL chemo infusion  70 mg/m2 (Treatment Plan Actual) Intravenous Once Michelle Merle, MD        REVIEW OF SYSTEMS:   Constitutional: Denies fevers, chills  or abnormal night sweats Eyes: Denies blurriness of vision, double vision or watery eyes Ears, nose, mouth, throat, and face: Denies mucositis or sore throat Respiratory: Denies cough, dyspnea or wheezes Cardiovascular: Denies palpitation, chest discomfort or lower extremity swelling Gastrointestinal:  Denies nausea, heartburn or change in bowel habits Skin: Denies abnormal skin rashes Lymphatics: Denies new lymphadenopathy or easy bruising Neurological:Denies numbness, tingling or new weaknesses Behavioral/Psych: Mood is stable, no new changes  All other systems were reviewed with the patient and are negative.  PHYSICAL EXAMINATION: ECOG PERFORMANCE STATUS: 1 - Symptomatic but completely ambulatory BP 130/66 mmHg  Pulse 90  Temp(Src) 98.2 F (36.8 C) (Oral)  Resp 18  Wt 158 lb 12.8 oz (72.031 kg)  SpO2 100% GENERAL:alert, no distress and comfortable SKIN: skin color, texture, turgor are normal, no rashes or significant lesions EYES: normal, conjunctiva are pink and non-injected, sclera clear OROPHARYNX:no exudate, no erythema and lips, buccal mucosa, and tongue normal  NECK: supple, thyroid normal size, non-tender, without nodularity LYMPH:  no palpable lymphadenopathy in the cervical, axillary or inguinal LUNGS: clear to auscultation and percussion with normal breathing effort HEART: regular rate & rhythm and no murmurs and no lower extremity  edema ABDOMEN:abdomen soft, non-tender and normal bowel sounds Musculoskeletal:no cyanosis of digits and no clubbing  PSYCH: alert & oriented x 3 with fluent speech NEURO: no focal motor/sensory deficits  LABORATORY DATA:  I have reviewed the data as listed CBC Latest Ref Rng 01/24/2015 01/03/2015 12/20/2014  WBC 3.9 - 10.3 10e3/uL 4.6 4.6 4.6  Hemoglobin 11.6 - 15.9 g/dL 9.8(L) 9.3(L) 9.5(L)  Hematocrit 34.8 - 46.6 % 31.2(L) 29.1(L) 30.2(L)  Platelets 145 - 400 10e3/uL 132(L) 97(L) 128(L)     Recent Labs  07/13/14 2051  08/11/14 2047 08/13/14 0558 08/14/14 0435  12/20/14 1023 01/03/15 0943 01/24/15 0800  NA  --   < > 133* 138 135  < > 139 137 141  K  --   < > 4.3 4.0 4.0  < > 4.8 4.7 4.4  CL  --   < > 98* 109 105  --   --   --   --   CO2  --   < > '26 26 25  '$ < > '23 23 26  '$ GLUCOSE  --   < > 97 110* 138*  < > 156* 172* 66*  BUN  --   < > 22* 18 18  < > 24.1 15.8 30.9*  CREATININE  --   < > 0.98 1.04* 0.94  < > 1.3* 1.2* 1.1  CALCIUM  --   < > 8.8* 8.2* 8.3*  < > 9.4 9.4 9.7  GFRNONAA  --   < > >60 57* >60  --   --   --   --   GFRAA  --   < > >60 >60 >60  --   --   --   --   PROT 7.6  < > 7.6 5.9* 6.5  < > 7.0 7.0 7.5  ALBUMIN 3.8  < > 3.7 2.7* 2.9*  < > 3.5 3.6 3.8  AST 65*  < > 70* 31 30  < > '30 27 25  '$ ALT 54  < > 51 28 28  < > 42 36 23  ALKPHOS 146*  < > 178* 134* 146*  < > 95 94 89  BILITOT 0.9  < > 0.7 1.0 0.7  < > <0.30 0.32 0.30  BILIDIR 0.2  --   --   --  0.1  --   --   --   --   IBILI 0.7  --   --   --  0.6  --   --   --   --   < > = values in this interval not displayed.  Results for ELEXA, KIVI (MRN [id number]) as of 01/24/2015 09:53  Ref. Range 09/07/2014 08:54 09/28/2014 09:13 10/26/2014 08:53 11/23/2014 08:43 01/03/2015 09:43  CEA Latest Ref Range: 0.0-5.0 ng/mL 555.1 (H) 182.4 (H) 54.6 (H) 18.2 (H) 6.6 (H)    PATHOLOGY REPORTS: Diagnosis 07/25/2014  Surgical [P], sigmoid - INVASIVE ADENOCARCINOMA ARISING IN A BACKGROUND OF HIGH GRADE DYSPLASIA. - SEE  COMMENT. Microscopic Comment Results are phoned to Dr. Olevia Erickson. Internal departmental review obtained (Dr. Donato Heinz) with agreement.  Diagnosis 09/05/2014 Liver, biopsy - METASTATIC ADENOCARCINOMA, SEE COMMENT. Microscopic Comment Given the patient's history of sigmoid invasive adenocarcinoma and the morphology, the findings are consistent with metastatic colorectal adenocarcinoma. The case was called to Dr. Burr Medico. Per request, MMR, MSI, and KRAS testing will be ordered.  KRAS MUTATION (+) c36G>A (p. G12D)  RADIOGRAPHIC STUDIES: I have personally reviewed the radiological images as listed and agreed with the findings in the report.   CT chest, abdomen and pelvis with contrast 10/10/2014 IMPRESSION: Interval decrease in size of multiple hepatic metastatic lesions.  Interval decrease in size of soft tissue mass adjacent to the sigmoid colon.  Interval decrease in wall thickening of the sigmoid colon.   Colonoscopy 07/25/2014 Dr. Olevia Erickson  ENDOSCOPIC IMPRESSION: Near circumferential medium mass was found in the sigmoid colon; multiple biopsies were performed using cold forceps, partially obstructing mass consistent with carcinoma. Placement of endoclips and tattoo at the margins of the mass which extends from 18-25 cm from the anus  ASSESSMENT & PLAN: 60 year old African-American female, presented with intermittent nausea vomiting and anemia.   1. Sigmoid colon cancer with liver metastasis, TxNxM1, stage IV, MSI stable, KRAS mutation(+) -I previously reviewed her CT scans, colonoscopy, and colon mass biopsy findings with patient and her husband in great details, images were reviewed in person  -I reviewed her liver biopsy results, which confirmed metastasis from colon cancer. -The natural history of metastatic colon cancer and treatment options were discussed with patient and her husband. Giving the diffuse metastasis in the liver, this is unfortunately incurable disease. I recommend  systemic chemotherapy to control her metastatic disease, the goal of therapy is palliative and to prolong her life. -The options of first-line chemotherapy were discussed with her, I recommend FOLFOX and Avastin every 2 weeks intravenously, and I will continue as long as she tolerates and no disease progression -her tumor MSI is stable, KRAS mutated, not a candidate for immunotherapy or EGFR inhibitor. -She is tolerating chemotherapy well, CEA has dropped significantly, restaging CT scan showed partial response. We'll continue chemotherapy  -Due to her cytopenia, especially thrombocytopenia, I'll further reduce her chemotherapy dose.  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 -Her ferritin is normal, serum iron and saturation slightly low, TIBC low normal, I suggest her to take oral iron supplement, she tolerates well, we'll continue once daily, with orange juice or vitamin C   3. DM, HTN -Continue follow-up with PCP - we discussed the potential impact of chemotherapy and premedication dexamethasone on her blood pressure and sugar,will  Monitor closely. Her BG has been  well controlled  -Her blood pressure has been much better controlled after we increased her Amlodipine to '10mg'$  daily   4. AKI -resolved now. Cr 1.1 today with EGFR 66  5. Mild thrombocytopenia -Secondary to chemotherapy. -will monitor closely.  PLan -Lab reviewed, adequate for treatment. cycle 12 FOLFOX plus Avastin today, with oxaliplatin dose reduction to '75mg'$ /m2, no Neulasta on day 3 -Restaging CT chest, abdomen and pelvis with contrast before next cycle chemotherapy -I'll see her back in 2 weeks.  All questions were answered. The patient knows to call the clinic with any problems, questions or concerns. I spent 20 minutes counseling the  patient face to face. The total time spent in the appointment was 30 minutes and more than 50% was on counseling.     Michelle Merle, MD 01/24/2015 9:52 AM

## 2015-01-24 NOTE — Progress Notes (Signed)
[id number]: Dr. Burr Medico notified of pt's glucose of 66.  Patient states she ate breakfast this morning and took 4 units of insulin.  Pt denies any symptoms of hypoglycemia.  Pt given orange juice and cheese and crackers per Dr. Burr Medico. Verbal order received to recheck patient's blood glucose prior to discharge. Order placed for CBG.   [id number] Haw, pharmacist, notified this RN that urine protein is needed prior to Avastin.  Order placed and patient aware of need for urine specimen. Unable to obtain specimen at this time, pt drinking water and knows specimen is needed prior to treatment start.

## 2015-01-24 NOTE — Telephone Encounter (Signed)
Per staff message and POF I have scheduled appts. Advised scheduler of appts. JMW  

## 2015-01-24 NOTE — Telephone Encounter (Signed)
per pof to sch pt appt-sent WM email to sch trmt-pt to get updated copy of sch b4 leaving

## 2015-01-24 NOTE — Patient Instructions (Signed)
[facility] Discharge Instructions for Patients Receiving Chemotherapy  Today you received the following chemotherapy agents:  Oxaliplatin, Leucovorin, 5FU and Avastin  To help prevent nausea and vomiting after your treatment, we encourage you to take your nausea medication as ordered per MD.   If you develop nausea and vomiting that is not controlled by your nausea medication, call the clinic.   BELOW ARE SYMPTOMS THAT SHOULD BE REPORTED IMMEDIATELY:  *FEVER GREATER THAN 100.5 F  *CHILLS WITH OR WITHOUT FEVER  NAUSEA AND VOMITING THAT IS NOT CONTROLLED WITH YOUR NAUSEA MEDICATION  *UNUSUAL SHORTNESS OF BREATH  *UNUSUAL BRUISING OR BLEEDING  TENDERNESS IN MOUTH AND THROAT WITH OR WITHOUT PRESENCE OF ULCERS  *URINARY PROBLEMS  *BOWEL PROBLEMS  UNUSUAL RASH Items with * indicate a potential emergency and should be followed up as soon as possible.  Feel free to call the clinic you have any questions or concerns. The clinic phone number is [phone].  Please show the [facility] at check-in to the Emergency Department and triage nurse.

## 2015-01-24 NOTE — Progress Notes (Signed)
1030: Ok to proceed with 0xaliplain before Avastin (awaiting urine sample for urine protein) per Pharmacist. 1200: Urine sample given. Within normal limits: Avastin ordered. Blood Sugar taken: 140

## 2015-01-26 ENCOUNTER — Ambulatory Visit: Payer: BLUE CROSS/BLUE SHIELD

## 2015-01-26 NOTE — Patient Instructions (Signed)

## 2015-02-05 ENCOUNTER — Ambulatory Visit (HOSPITAL_COMMUNITY)
Admission: RE | Admit: 2015-02-05 | Discharge: 2015-02-05 | Disposition: A | Discharge status: Home / Self Care | Payer: BLUE CROSS/BLUE SHIELD | Source: Ambulatory Visit | Attending: Hematology | Admitting: Hematology

## 2015-02-05 ENCOUNTER — Encounter (HOSPITAL_COMMUNITY): Payer: Self-pay

## 2015-02-05 DIAGNOSIS — C787 Secondary malignant neoplasm of liver and intrahepatic bile duct: Secondary | ICD-10-CM | POA: Insufficient documentation

## 2015-02-05 DIAGNOSIS — K8020 Calculus of gallbladder without cholecystitis without obstruction: Secondary | ICD-10-CM | POA: Diagnosis not present

## 2015-02-05 DIAGNOSIS — I2510 Atherosclerotic heart disease of native coronary artery without angina pectoris: Secondary | ICD-10-CM | POA: Diagnosis not present

## 2015-02-05 DIAGNOSIS — C189 Malignant neoplasm of colon, unspecified: Secondary | ICD-10-CM | POA: Diagnosis not present

## 2015-02-05 MED ORDER — IOHEXOL 300 MG/ML  SOLN
100.0000 mL | Freq: Once | INTRAMUSCULAR | Status: AC | PRN
  Administered 2015-02-05: 100 mL via INTRAVENOUS

## 2015-02-05 MED ORDER — IOHEXOL 300 MG/ML  SOLN
50.0000 mL | Freq: Once | INTRAMUSCULAR | Status: DC | PRN
  Administered 2015-02-05: 50 mL via ORAL
  Filled 2015-02-05: qty 50

## 2015-02-07 ENCOUNTER — Ambulatory Visit (HOSPITAL_BASED_OUTPATIENT_CLINIC_OR_DEPARTMENT_OTHER): Payer: BLUE CROSS/BLUE SHIELD

## 2015-02-07 ENCOUNTER — Ambulatory Visit (HOSPITAL_BASED_OUTPATIENT_CLINIC_OR_DEPARTMENT_OTHER): Payer: BLUE CROSS/BLUE SHIELD | Admitting: Hematology

## 2015-02-07 ENCOUNTER — Encounter: Payer: Self-pay | Admitting: Hematology

## 2015-02-07 ENCOUNTER — Other Ambulatory Visit (HOSPITAL_BASED_OUTPATIENT_CLINIC_OR_DEPARTMENT_OTHER): Payer: BLUE CROSS/BLUE SHIELD

## 2015-02-07 ENCOUNTER — Telehealth: Payer: Self-pay | Admitting: Hematology

## 2015-02-07 VITALS — BP 141/53 | HR 94 | Temp 97.9°F | Resp 18 | Ht 64.0 in | Wt 159.9 lb

## 2015-02-07 DIAGNOSIS — E860 Dehydration: Secondary | ICD-10-CM

## 2015-02-07 DIAGNOSIS — Z5112 Encounter for antineoplastic immunotherapy: Secondary | ICD-10-CM | POA: Diagnosis not present

## 2015-02-07 DIAGNOSIS — C187 Malignant neoplasm of sigmoid colon: Secondary | ICD-10-CM | POA: Diagnosis not present

## 2015-02-07 DIAGNOSIS — D6959 Other secondary thrombocytopenia: Secondary | ICD-10-CM

## 2015-02-07 DIAGNOSIS — C189 Malignant neoplasm of colon, unspecified: Secondary | ICD-10-CM

## 2015-02-07 DIAGNOSIS — C787 Secondary malignant neoplasm of liver and intrahepatic bile duct: Secondary | ICD-10-CM | POA: Diagnosis not present

## 2015-02-07 DIAGNOSIS — Z5111 Encounter for antineoplastic chemotherapy: Secondary | ICD-10-CM

## 2015-02-07 DIAGNOSIS — D630 Anemia in neoplastic disease: Secondary | ICD-10-CM | POA: Diagnosis not present

## 2015-02-07 DIAGNOSIS — N179 Acute kidney failure, unspecified: Secondary | ICD-10-CM

## 2015-02-07 DIAGNOSIS — E119 Type 2 diabetes mellitus without complications: Secondary | ICD-10-CM

## 2015-02-07 DIAGNOSIS — I10 Essential (primary) hypertension: Secondary | ICD-10-CM

## 2015-02-07 DIAGNOSIS — T451X5A Adverse effect of antineoplastic and immunosuppressive drugs, initial encounter: Secondary | ICD-10-CM

## 2015-02-07 LAB — CBC WITH DIFFERENTIAL/PLATELET
BASO%: 0.2 % (ref 0.0–2.0)
Basophils Absolute: 0 10*3/uL (ref 0.0–0.1)
EOS ABS: 0.3 10*3/uL (ref 0.0–0.5)
EOS%: 7.2 % — ABNORMAL HIGH (ref 0.0–7.0)
HEMATOCRIT: 28.4 % — AB (ref 34.8–46.6)
HGB: 9.2 g/dL — ABNORMAL LOW (ref 11.6–15.9)
LYMPH#: 1 10*3/uL (ref 0.9–3.3)
LYMPH%: 21.5 % (ref 14.0–49.7)
MCH: 30.6 pg (ref 25.1–34.0)
MCHC: 32.4 g/dL (ref 31.5–36.0)
MCV: 94.4 fL (ref 79.5–101.0)
MONO#: 0.9 10*3/uL (ref 0.1–0.9)
MONO%: 19.1 % — ABNORMAL HIGH (ref 0.0–14.0)
NEUT%: 52 % (ref 38.4–76.8)
NEUTROS ABS: 2.3 10*3/uL (ref 1.5–6.5)
PLATELETS: 110 10*3/uL — AB (ref 145–400)
RBC: 3.01 10*6/uL — ABNORMAL LOW (ref 3.70–5.45)
RDW: 16.2 % — ABNORMAL HIGH (ref 11.2–14.5)
WBC: 4.5 10*3/uL (ref 3.9–10.3)

## 2015-02-07 LAB — COMPREHENSIVE METABOLIC PANEL
ALBUMIN: 3.7 g/dL (ref 3.5–5.0)
ALK PHOS: 104 U/L (ref 40–150)
ALT: 42 U/L (ref 0–55)
AST: 36 U/L — ABNORMAL HIGH (ref 5–34)
Anion Gap: 8 mEq/L (ref 3–11)
BUN: 30.7 mg/dL — ABNORMAL HIGH (ref 7.0–26.0)
CO2: 24 meq/L (ref 22–29)
Calcium: 9.3 mg/dL (ref 8.4–10.4)
Chloride: 107 mEq/L (ref 98–109)
Creatinine: 1.3 mg/dL — ABNORMAL HIGH (ref 0.6–1.1)
EGFR: 53 mL/min/{1.73_m2} — AB (ref >90)
GLUCOSE: 112 mg/dL (ref 70–140)
POTASSIUM: 4.8 meq/L (ref 3.5–5.1)
SODIUM: 139 meq/L (ref 136–145)
Total Bilirubin: <0.3 mg/dL (ref 0.20–1.20)
Total Protein: 7.3 g/dL (ref 6.4–8.3)

## 2015-02-07 MED ORDER — SODIUM CHLORIDE 0.9 % IV SOLN
Freq: Once | INTRAVENOUS | Status: AC
  Administered 2015-02-07: 10:00:00 via INTRAVENOUS
  Filled 2015-02-07: qty 4

## 2015-02-07 MED ORDER — SODIUM CHLORIDE 0.9 % IV SOLN
5.0000 mg/kg | Freq: Once | INTRAVENOUS | Status: AC
  Administered 2015-02-07: 350 mg via INTRAVENOUS
  Filled 2015-02-07: qty 14

## 2015-02-07 MED ORDER — LEUCOVORIN CALCIUM INJECTION 100 MG
20.0000 mg/m2 | Freq: Once | INTRAMUSCULAR | Status: AC
  Administered 2015-02-07: 36 mg via INTRAVENOUS
  Filled 2015-02-07: qty 1.8

## 2015-02-07 MED ORDER — OXALIPLATIN CHEMO INJECTION 100 MG/20ML
70.0000 mg/m2 | Freq: Once | INTRAVENOUS | Status: AC
  Administered 2015-02-07: 125 mg via INTRAVENOUS
  Filled 2015-02-07: qty 20

## 2015-02-07 MED ORDER — SODIUM CHLORIDE 0.9 % IV SOLN
Freq: Once | INTRAVENOUS | Status: AC
  Administered 2015-02-07: 09:00:00 via INTRAVENOUS

## 2015-02-07 MED ORDER — DEXTROSE 5 % IV SOLN
Freq: Once | INTRAVENOUS | Status: AC
  Administered 2015-02-07: 11:00:00 via INTRAVENOUS

## 2015-02-07 MED ORDER — FLUOROURACIL CHEMO INJECTION 5 GM/100ML
2200.0000 mg/m2 | INTRAVENOUS | Status: DC
  Administered 2015-02-07: 3950 mg via INTRAVENOUS
  Filled 2015-02-07: qty 79

## 2015-02-07 NOTE — Telephone Encounter (Signed)
per pof to sch pt appt-gave pt copy of avs-sent MW/Melissa email to sch trmt-pt to get updated copy on 1/20 pump d/c appt

## 2015-02-07 NOTE — Progress Notes (Signed)
[facility]  [phone] Fax:([phone]) [phone]  Clinic Follow Up Note   Patient Care Team: Dione Housekeeper, MD as PCP - General (Family Medicine) Tania Ade, RN as Registered Nurse (Oncology) 02/07/2015  CHIEF COMPLAINTS:  Follow-up of metastatic colon cancer  Oncology History   Metastatic colon cancer to liver   Staging form: Colon and Rectum, AJCC 7th Edition     Clinical: Stage Unknown ([place], NX, M1) - Unsigned        Metastatic colon cancer to liver ([place])   07/14/2014 Imaging CT abdomen and pelvis with contrast showed a 6 cm segment of sigmoid bowel wall thickening with adjacent 14 x 12 mm nodules, diffuse hepatic metastasis. No bowel obstruction.   07/14/2014 Initial Diagnosis Metastatic colon cancer to liver   07/25/2014 Procedure Colonoscopy showed a near circumferential medial mass in the sigmoid colon, partially obstructing consistent with carcinoma.   07/25/2014 Initial Biopsy Sigmoid: Biopsy showed invasive adenocarcinoma arising in a background of high-grade dysplasia.   07/28/2014 Imaging CT chest showed no evidence of metastasis   08/09/2014 -  Chemotherapy mFOLFOX6 every 2 weeks, dose reduction from cycle 7 due to cytopenia, Avastin added from cycle 3   08/11/2014 - 08/15/2014 Hospital Admission Pt was admitted for fever and nausea, treated for UTI, liver biopsy cancelled due to the infection     HISTORY OF PRESENTING ILLNESS:  Michelle Erickson 60 y.o. female is here because of abnoaml CT findings which is highly suspecious for metastatic colon cancer.   She presented intermittent nausea, anorexia and abdominal pain since Nov 2015, she has mild pain in the LUQ, crapy pain, positional (bending over),   it happened once every 2-3 weeks, and it has been more frequent in the past month. She has normal BM, but did notice the caliber of the stool is smaller than before. She denies any melena or hematochezia. She lost  about 13 lbs in the past 6 months.   She presents to local emergency room in November 2015, and May 2016, was felt to be related to gastric virus. Due to the worsening symptoms lately, she went to [facility] ED on 07/13/2014, CT abdomen was obtained which showed multiple large liver metastasis and probable sigmoid colon mass. She was referred to [place] for further workup. She is scheduled to see GI Dr. Olevia Perches this afternoon.  CURRENT THERAPY: mFOLFOX6 and Avastin every 2 weeks, started on 08/09/2014   INTERIM HISTORY [name] returns for follow-up and discuss restaging CT scan result. She is doing well overall, she has been tolerating chemotherapy very well, without significant side effects except mild fatigue after chemotherapy. She has good appetite and eating well. Her weight is stable. She denies any pain, abdominal discomfort, dyspnea or other complaints.   MEDICAL HISTORY:  Past Medical History  Diagnosis Date  . Hyperlipemia   . Hypertension   . Metastatic colon cancer to liver ([place]) 07/21/2014  . Wears glasses     contacts/glasses  . Snoring   . Back pain   . Family history of colon cancer   . Diabetes mellitus without complication ([place])     SURGICAL HISTORY: Past Surgical History  Procedure Laterality Date  . Mouth surgery    . Cataract extraction Bilateral   . Portacath placement N/A 08/03/2014    Procedure: INSERTION PORT-A-CATH;  Surgeon: Stark Klein, MD;  Location: [facility];  Service: General;  Laterality: N/A;    SOCIAL HISTORY: Social History   Social History  .  Marital Status: Married    Spouse Name: Bethann Berkshire  . Number of Children: 2  . Years of Education: N/A   Occupational History  . Not on file.   Social History Main Topics  . Smoking status: Former Smoker -- 1.00 packs/day for 6 years    Types: Cigarettes    Quit date: 01/21/1976  . Smokeless tobacco: Not on file  . Alcohol Use: No  . Drug Use: No  . Sexual Activity: Yes    Birth Control/ Protection:  Post-menopausal     Comment: # 2 pregnancies and #2 live births   Other Topics Concern  . Not on file   Social History Narrative   Married, husband [name]   Has #2 adult children   Previously worked at [employer]       FAMILY HISTORY: Family History  Problem Relation Age of Onset  . COPD Father   . Heart attack Father   . Arthritis Sister   . Diabetes Sister   . Colon cancer Sister     dx over 65  . Colon polyps Sister   . Seizures Brother   . Colon cancer Sister     dx in her early 41s, died in her early to mid 18s  . Diabetes Mother     ALLERGIES:  has No Known Allergies.  MEDICATIONS:  Current Outpatient Prescriptions  Medication Sig Dispense Refill  . amLODipine (NORVASC) 5 MG tablet Take 2 tablets (10 mg total) by mouth daily. 30 tablet 0  . atorvastatin (LIPITOR) 40 MG tablet Take 40 mg by mouth daily.    . cetirizine (ZYRTEC) 10 MG tablet Take 10 mg by mouth daily as needed for allergies.    . ferrous sulfate 325 (65 FE) MG tablet Take 325 mg by mouth daily with breakfast.    . HUMALOG KWIKPEN 100 UNIT/ML KiwkPen     . Insulin Glargine (LANTUS SOLOSTAR) 100 UNIT/ML Solostar Pen Inject 23 Units into the skin daily at 10 pm.    . insulin lispro (HUMALOG) 100 UNIT/ML injection Inject 4-8 Units into the skin 2 (two) times daily as needed for high blood sugar. Sliding scale    . lidocaine-prilocaine (EMLA) cream     . ondansetron (ZOFRAN-ODT) 8 MG disintegrating tablet Take 8 mg by mouth every 8 (eight) hours as needed for nausea or vomiting.     No current facility-administered medications for this visit.    REVIEW OF SYSTEMS:   Constitutional: Denies fevers, chills or abnormal night sweats Eyes: Denies blurriness of vision, double vision or watery eyes Ears, nose, mouth, throat, and face: Denies mucositis or sore throat Respiratory: Denies cough, dyspnea or wheezes Cardiovascular: Denies palpitation, chest discomfort or lower extremity  swelling Gastrointestinal:  Denies nausea, heartburn or change in bowel habits Skin: Denies abnormal skin rashes Lymphatics: Denies new lymphadenopathy or easy bruising Neurological:Denies numbness, tingling or new weaknesses Behavioral/Psych: Mood is stable, no new changes  All other systems were reviewed with the patient and are negative.  PHYSICAL EXAMINATION: ECOG PERFORMANCE STATUS: 1 - Symptomatic but completely ambulatory BP 141/53 mmHg  Pulse 94  Temp(Src) 97.9 F (36.6 C) (Oral)  Resp 18  Ht 5\' 4"  (1.626 m)  Wt 159 lb 14.4 oz (72.53 kg)  BMI 27.43 kg/m2  SpO2 98% GENERAL:alert, no distress and comfortable SKIN: skin color, texture, turgor are normal, no rashes or significant lesions EYES: normal, conjunctiva are pink and non-injected, sclera clear OROPHARYNX:no exudate, no erythema and lips, buccal mucosa, and tongue normal  NECK: supple, thyroid normal size, non-tender, without nodularity LYMPH:  no palpable lymphadenopathy in the cervical, axillary or inguinal LUNGS: clear to auscultation and percussion with normal breathing effort HEART: regular rate & rhythm and no murmurs and no lower extremity edema ABDOMEN:abdomen soft, non-tender and normal bowel sounds Musculoskeletal:no cyanosis of digits and no clubbing  PSYCH: alert & oriented x 3 with fluent speech NEURO: no focal motor/sensory deficits  LABORATORY DATA:  I have reviewed the data as listed CBC Latest Ref Rng 02/07/2015 01/24/2015 01/03/2015  WBC 3.9 - 10.3 10e3/uL 4.5 4.6 4.6  Hemoglobin 11.6 - 15.9 g/dL 9.2(L) 9.8(L) 9.3(L)  Hematocrit 34.8 - 46.6 % 28.4(L) 31.2(L) 29.1(L)  Platelets 145 - 400 10e3/uL 110(L) 132(L) 97(L)     Recent Labs  07/13/14 2051  08/11/14 2047 08/13/14 0558 08/14/14 0435  12/20/14 1023 01/03/15 0943 01/24/15 0800  NA  --   < > 133* 138 135  < > 139 137 141  K  --   < > 4.3 4.0 4.0  < > 4.8 4.7 4.4  CL  --   < > 98* 109 105  --   --   --   --   CO2  --   < > '26 26 25  '$ < >  '23 23 26  '$ GLUCOSE  --   < > 97 110* 138*  < > 156* 172* 66*  BUN  --   < > 22* 18 18  < > 24.1 15.8 30.9*  CREATININE  --   < > 0.98 1.04* 0.94  < > 1.3* 1.2* 1.1  CALCIUM  --   < > 8.8* 8.2* 8.3*  < > 9.4 9.4 9.7  GFRNONAA  --   < > >60 57* >60  --   --   --   --   GFRAA  --   < > >60 >60 >60  --   --   --   --   PROT 7.6  < > 7.6 5.9* 6.5  < > 7.0 7.0 7.5  ALBUMIN 3.8  < > 3.7 2.7* 2.9*  < > 3.5 3.6 3.8  AST 65*  < > 70* 31 30  < > '30 27 25  '$ ALT 54  < > 51 28 28  < > 42 36 23  ALKPHOS 146*  < > 178* 134* 146*  < > 95 94 89  BILITOT 0.9  < > 0.7 1.0 0.7  < > <0.30 0.32 0.30  BILIDIR 0.2  --   --   --  0.1  --   --   --   --   IBILI 0.7  --   --   --  0.6  --   --   --   --   < > = values in this interval not displayed.  Results for ZENIYAH, PEASTER (MRN [id number]) as of 01/24/2015 09:53  Ref. Range 09/07/2014 08:54 09/28/2014 09:13 10/26/2014 08:53 11/23/2014 08:43 01/03/2015 09:43  CEA Latest Ref Range: 0.0-5.0 ng/mL 555.1 (H) 182.4 (H) 54.6 (H) 18.2 (H) 6.6 (H)    PATHOLOGY REPORTS: Diagnosis 07/25/2014  Surgical [P], sigmoid - INVASIVE ADENOCARCINOMA ARISING IN A BACKGROUND OF HIGH GRADE DYSPLASIA. - SEE COMMENT. Microscopic Comment Results are phoned to Dr. Olevia Perches. Internal departmental review obtained (Dr. Donato Heinz) with agreement.  Diagnosis 09/05/2014 Liver, biopsy - METASTATIC ADENOCARCINOMA, SEE COMMENT. Microscopic Comment Given the patient's history of sigmoid invasive adenocarcinoma and the morphology, the findings are consistent with metastatic colorectal  adenocarcinoma. The case was called to Dr. Burr Medico. Per request, MMR, MSI, and KRAS testing will be ordered.  KRAS MUTATION (+) c36G>A (p. G12D)  RADIOGRAPHIC STUDIES: I have personally reviewed the radiological images as listed and agreed with the findings in the report.   CT chest, abdomen and pelvis with contrast 02/04/2014 IMPRESSION: 1. Improving hepatic metastatic disease. 2. Decrease in size of a perisigmoid soft  tissue nodule. 3. Coronary artery calcification. 4. Cholelithiasis.    Colonoscopy 07/25/2014 Dr. Olevia Perches  ENDOSCOPIC IMPRESSION: Near circumferential medium mass was found in the sigmoid colon; multiple biopsies were performed using cold forceps, partially obstructing mass consistent with carcinoma. Placement of endoclips and tattoo at the margins of the mass which extends from 18-25 cm from the anus  ASSESSMENT & PLAN: 60 year old African-American female, presented with intermittent nausea vomiting and anemia.   1. Sigmoid colon cancer with liver metastasis, TxNxM1, stage IV, MSI stable, KRAS mutation(+) -I previously reviewed her CT scans, colonoscopy, and colon mass biopsy findings with patient and her husband in great details, images were reviewed in person  -I reviewed her liver biopsy results, which confirmed metastasis from colon cancer. -The natural history of metastatic colon cancer and treatment options were discussed with patient and her husband. Giving the diffuse metastasis in the liver, this is unfortunately incurable disease. I recommend systemic chemotherapy to control her metastatic disease, the goal of therapy is palliative and to prolong her life. -The options of first-line chemotherapy were discussed with her, I recommend FOLFOX and Avastin every 2 weeks intravenously, and I will continue as long as she tolerates and no disease progression -her tumor MSI is stable, KRAS mutated, not a candidate for immunotherapy or EGFR inhibitor. -Her CEA has dropped significantly. I reviewed her restaging CT scan from 02/05/2015 which showed continuous partial response.  -she is tolerating chemo very well, will continue. We discussed chemo break for 1 or 2 cycles if she wishes.   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 -Her ferritin is normal, serum iron and saturation slightly low, TIBC low normal, I suggest her to take oral iron supplement, she tolerates well, we'll continue once daily, with orange juice or vitamin C   3. DM, HTN -Continue follow-up with PCP - we discussed the potential impact of chemotherapy and premedication dexamethasone on her blood pressure and sugar,will  Monitor closely. Her BG has been well controlled  -Her blood pressure has been much better controlled after we increased her Amlodipine to '10mg'$  daily   4. AKI -slightly fluctuates. Cr 1.3 today with EGFR 53 -I encourage her to drink more fluids   5. Mild thrombocytopenia -Secondary to chemotherapy. -will monitor closely.  PLan -Lab reviewed, adequate for treatment. Continue FOLFOX plus Avastin today and every 2 weeks, no Neulasta on day 3 -I'll see her back in 4 weeks.  All questions were answered. The patient knows to call the clinic with any problems, questions or concerns.  I spent 20 minutes counseling the patient face to face. The total time spent in the appointment was 30 minutes and more than 50% was on counseling.     Truitt Merle, MD 02/07/2015

## 2015-02-07 NOTE — Patient Instructions (Signed)
[facility] Discharge Instructions for Patients Receiving Chemotherapy  Today you received the following chemotherapy agents:  Oxaliplatin, Leucovorin, 5FU and Avastin  To help prevent nausea and vomiting after your treatment, we encourage you to take your nausea medication as ordered per MD.   If you develop nausea and vomiting that is not controlled by your nausea medication, call the clinic.   BELOW ARE SYMPTOMS THAT SHOULD BE REPORTED IMMEDIATELY:  *FEVER GREATER THAN 100.5 F  *CHILLS WITH OR WITHOUT FEVER  NAUSEA AND VOMITING THAT IS NOT CONTROLLED WITH YOUR NAUSEA MEDICATION  *UNUSUAL SHORTNESS OF BREATH  *UNUSUAL BRUISING OR BLEEDING  TENDERNESS IN MOUTH AND THROAT WITH OR WITHOUT PRESENCE OF ULCERS  *URINARY PROBLEMS  *BOWEL PROBLEMS  UNUSUAL RASH Items with * indicate a potential emergency and should be followed up as soon as possible.  Feel free to call the clinic you have any questions or concerns. The clinic phone number is [phone].  Please show the [facility] at check-in to the Emergency Department and triage nurse.

## 2015-02-08 LAB — CEA: CEA: 16 ng/mL — ABNORMAL HIGH (ref 0.0–4.7)

## 2015-02-08 LAB — CEA (PARALLEL TESTING): CEA: 6 ng/mL — AB (ref 0.0–5.0)

## 2015-02-09 ENCOUNTER — Ambulatory Visit (HOSPITAL_BASED_OUTPATIENT_CLINIC_OR_DEPARTMENT_OTHER): Payer: BLUE CROSS/BLUE SHIELD

## 2015-02-09 DIAGNOSIS — Z452 Encounter for adjustment and management of vascular access device: Secondary | ICD-10-CM | POA: Diagnosis not present

## 2015-02-09 DIAGNOSIS — C787 Secondary malignant neoplasm of liver and intrahepatic bile duct: Principal | ICD-10-CM

## 2015-02-09 DIAGNOSIS — C187 Malignant neoplasm of sigmoid colon: Secondary | ICD-10-CM | POA: Diagnosis not present

## 2015-02-09 DIAGNOSIS — C189 Malignant neoplasm of colon, unspecified: Secondary | ICD-10-CM

## 2015-02-09 MED ORDER — HEPARIN SOD (PORK) LOCK FLUSH 100 UNIT/ML IV SOLN
500.0000 [IU] | Freq: Once | INTRAVENOUS | Status: AC | PRN
  Administered 2015-02-09: 500 [IU]
  Filled 2015-02-09: qty 5

## 2015-02-09 MED ORDER — SODIUM CHLORIDE 0.9 % IJ SOLN
10.0000 mL | INTRAMUSCULAR | Status: DC | PRN
  Administered 2015-02-09: 10 mL
  Filled 2015-02-09: qty 10

## 2015-02-21 ENCOUNTER — Encounter: Payer: Self-pay | Admitting: Hematology

## 2015-02-21 ENCOUNTER — Other Ambulatory Visit (HOSPITAL_BASED_OUTPATIENT_CLINIC_OR_DEPARTMENT_OTHER): Payer: BLUE CROSS/BLUE SHIELD

## 2015-02-21 ENCOUNTER — Ambulatory Visit (HOSPITAL_BASED_OUTPATIENT_CLINIC_OR_DEPARTMENT_OTHER): Payer: BLUE CROSS/BLUE SHIELD

## 2015-02-21 ENCOUNTER — Telehealth: Payer: Self-pay | Admitting: Hematology

## 2015-02-21 ENCOUNTER — Ambulatory Visit (HOSPITAL_BASED_OUTPATIENT_CLINIC_OR_DEPARTMENT_OTHER): Payer: BLUE CROSS/BLUE SHIELD | Admitting: Hematology

## 2015-02-21 VITALS — BP 132/62 | HR 82 | Temp 98.2°F | Resp 18 | Ht 64.0 in | Wt 162.4 lb

## 2015-02-21 DIAGNOSIS — C787 Secondary malignant neoplasm of liver and intrahepatic bile duct: Secondary | ICD-10-CM

## 2015-02-21 DIAGNOSIS — C187 Malignant neoplasm of sigmoid colon: Secondary | ICD-10-CM

## 2015-02-21 DIAGNOSIS — C189 Malignant neoplasm of colon, unspecified: Secondary | ICD-10-CM

## 2015-02-21 DIAGNOSIS — N179 Acute kidney failure, unspecified: Secondary | ICD-10-CM

## 2015-02-21 DIAGNOSIS — D6481 Anemia due to antineoplastic chemotherapy: Secondary | ICD-10-CM

## 2015-02-21 DIAGNOSIS — D509 Iron deficiency anemia, unspecified: Secondary | ICD-10-CM

## 2015-02-21 DIAGNOSIS — D6959 Other secondary thrombocytopenia: Secondary | ICD-10-CM

## 2015-02-21 DIAGNOSIS — T451X5A Adverse effect of antineoplastic and immunosuppressive drugs, initial encounter: Secondary | ICD-10-CM

## 2015-02-21 DIAGNOSIS — D630 Anemia in neoplastic disease: Secondary | ICD-10-CM

## 2015-02-21 DIAGNOSIS — Z5111 Encounter for antineoplastic chemotherapy: Secondary | ICD-10-CM

## 2015-02-21 DIAGNOSIS — I10 Essential (primary) hypertension: Secondary | ICD-10-CM

## 2015-02-21 DIAGNOSIS — Z5112 Encounter for antineoplastic immunotherapy: Secondary | ICD-10-CM | POA: Diagnosis not present

## 2015-02-21 DIAGNOSIS — E119 Type 2 diabetes mellitus without complications: Secondary | ICD-10-CM

## 2015-02-21 LAB — CBC WITH DIFFERENTIAL/PLATELET
BASO%: 0.8 % (ref 0.0–2.0)
Basophils Absolute: 0 10*3/uL (ref 0.0–0.1)
EOS ABS: 0.2 10*3/uL (ref 0.0–0.5)
EOS%: 4.7 % (ref 0.0–7.0)
HEMATOCRIT: 30.7 % — AB (ref 34.8–46.6)
HGB: 10 g/dL — ABNORMAL LOW (ref 11.6–15.9)
LYMPH#: 1.3 10*3/uL (ref 0.9–3.3)
LYMPH%: 25.2 % (ref 14.0–49.7)
MCH: 30.8 pg (ref 25.1–34.0)
MCHC: 32.5 g/dL (ref 31.5–36.0)
MCV: 94.8 fL (ref 79.5–101.0)
MONO#: 0.9 10*3/uL (ref 0.1–0.9)
MONO%: 16.9 % — ABNORMAL HIGH (ref 0.0–14.0)
NEUT%: 52.4 % (ref 38.4–76.8)
NEUTROS ABS: 2.7 10*3/uL (ref 1.5–6.5)
PLATELETS: 113 10*3/uL — AB (ref 145–400)
RBC: 3.23 10*6/uL — ABNORMAL LOW (ref 3.70–5.45)
RDW: 16.8 % — ABNORMAL HIGH (ref 11.2–14.5)
WBC: 5.1 10*3/uL (ref 3.9–10.3)

## 2015-02-21 LAB — COMPREHENSIVE METABOLIC PANEL
ALBUMIN: 3.6 g/dL (ref 3.5–5.0)
ALK PHOS: 105 U/L (ref 40–150)
ALT: 40 U/L (ref 0–55)
ANION GAP: 9 meq/L (ref 3–11)
AST: 35 U/L — ABNORMAL HIGH (ref 5–34)
BILIRUBIN TOTAL: 0.3 mg/dL (ref 0.20–1.20)
BUN: 20.4 mg/dL (ref 7.0–26.0)
CALCIUM: 9.2 mg/dL (ref 8.4–10.4)
CO2: 25 mEq/L (ref 22–29)
Chloride: 105 mEq/L (ref 98–109)
Creatinine: 1.1 mg/dL (ref 0.6–1.1)
EGFR: 61 mL/min/{1.73_m2} — AB (ref >90)
Glucose: 99 mg/dl (ref 70–140)
POTASSIUM: 4.4 meq/L (ref 3.5–5.1)
Sodium: 139 mEq/L (ref 136–145)
TOTAL PROTEIN: 7.3 g/dL (ref 6.4–8.3)

## 2015-02-21 MED ORDER — DEXTROSE 5 % IV SOLN
Freq: Once | INTRAVENOUS | Status: AC
  Administered 2015-02-21: 12:00:00 via INTRAVENOUS

## 2015-02-21 MED ORDER — LEUCOVORIN CALCIUM INJECTION 100 MG
20.0000 mg/m2 | Freq: Once | INTRAMUSCULAR | Status: AC
  Administered 2015-02-21: 36 mg via INTRAVENOUS
  Filled 2015-02-21: qty 1.8

## 2015-02-21 MED ORDER — BEVACIZUMAB CHEMO INJECTION 400 MG/16ML
5.0000 mg/kg | Freq: Once | INTRAVENOUS | Status: AC
  Administered 2015-02-21: 350 mg via INTRAVENOUS
  Filled 2015-02-21: qty 14

## 2015-02-21 MED ORDER — OXALIPLATIN CHEMO INJECTION 100 MG/20ML
70.0000 mg/m2 | Freq: Once | INTRAVENOUS | Status: AC
  Administered 2015-02-21: 125 mg via INTRAVENOUS
  Filled 2015-02-21: qty 20

## 2015-02-21 MED ORDER — SODIUM CHLORIDE 0.9 % IV SOLN
Freq: Once | INTRAVENOUS | Status: AC
  Administered 2015-02-21: 11:00:00 via INTRAVENOUS
  Filled 2015-02-21: qty 4

## 2015-02-21 MED ORDER — FLUOROURACIL CHEMO INJECTION 5 GM/100ML
2200.0000 mg/m2 | INTRAVENOUS | Status: DC
  Administered 2015-02-21: 3950 mg via INTRAVENOUS
  Filled 2015-02-21: qty 79

## 2015-02-21 MED ORDER — SODIUM CHLORIDE 0.9 % IV SOLN
Freq: Once | INTRAVENOUS | Status: AC
  Administered 2015-02-21: 11:00:00 via INTRAVENOUS

## 2015-02-21 NOTE — Progress Notes (Signed)
[place]  Telephone:([phone]) [phone] Fax:(336) [phone]  Clinic Follow Up Note   Patient Care Team: Dione Housekeeper, MD as PCP - General (Family Medicine) Tania Ade, RN as Registered Nurse (Oncology) 02/21/2015  CHIEF COMPLAINTS:  Follow-up of metastatic colon cancer  Oncology History   Metastatic colon cancer to liver   Staging form: Colon and Rectum, AJCC 7th Edition     Clinical: Stage Unknown ([place], NX, M1) - Unsigned        Metastatic colon cancer to liver ([place])   07/14/2014 Imaging CT abdomen and pelvis with contrast showed a 6 cm segment of sigmoid bowel wall thickening with adjacent 14 x 12 mm nodules, diffuse hepatic metastasis. No bowel obstruction.   07/14/2014 Initial Diagnosis Metastatic colon cancer to liver   07/25/2014 Procedure Colonoscopy showed a near circumferential medial mass in the sigmoid colon, partially obstructing consistent with carcinoma.   07/25/2014 Initial Biopsy Sigmoid: Biopsy showed invasive adenocarcinoma arising in a background of high-grade dysplasia.   07/28/2014 Imaging CT chest showed no evidence of metastasis   08/09/2014 -  Chemotherapy mFOLFOX6 every 2 weeks, dose reduction from cycle 7 due to cytopenia, Avastin added from cycle 3   08/11/2014 - 08/15/2014 Hospital Admission Pt was admitted for fever and nausea, treated for UTI, liver biopsy cancelled due to the infection     HISTORY OF PRESENTING ILLNESS:  Michelle Erickson 60 y.o. female is here because of abnoaml CT findings which is highly suspecious for metastatic colon cancer.   She presented intermittent nausea, anorexia and abdominal pain since Nov 2015, she has mild pain in the LUQ, crapy pain, positional (bending over),   it happened once every 2-3 weeks, and it has been more frequent in the past month. She has normal BM, but did notice the caliber of the stool is smaller than before. She denies any melena or hematochezia. She lost  about 13 lbs in the past 6 months.   She presents to local emergency room in November 2015, and May 2016, was felt to be related to gastric virus. Due to the worsening symptoms lately, she went to [facility] on 07/13/2014, CT abdomen was obtained which showed multiple large liver metastasis and probable sigmoid colon mass. She was referred to [place] for further workup. She is scheduled to see GI Dr. Olevia Perches this afternoon.  CURRENT THERAPY: mFOLFOX6 and Avastin every 2 weeks, started on 08/09/2014   INTERIM HISTORY Michelle Erickson returns for follow-up and chemo. She is doing well overall, tolerating chemotherapy very well.  She has mild fatigue,  Occasional nausea, no vomiting, bowel movements normal. She has good appetite and eating well. Weight is stable.  MEDICAL HISTORY:  Past Medical History  Diagnosis Date  . Hyperlipemia   . Hypertension   . Metastatic colon cancer to liver ([place]) 07/21/2014  . Wears glasses     contacts/glasses  . Snoring   . Back pain   . Family history of colon cancer   . Diabetes mellitus without complication ([place])     SURGICAL HISTORY: Past Surgical History  Procedure Laterality Date  . Mouth surgery    . Cataract extraction Bilateral   . Portacath placement N/A 08/03/2014    Procedure: INSERTION PORT-A-CATH;  Surgeon: Stark Klein, MD;  Location: [facility];  Service: General;  Laterality: N/A;    SOCIAL HISTORY: Social History   Social History  . Marital Status: Married    Spouse Name: Charlotte Crumb  . Number of Children: 2  .  Years of Education: N/A   Occupational History  . Not on file.   Social History Main Topics  . Smoking status: Former Smoker -- 1.00 packs/day for 6 years    Types: Cigarettes    Quit date: 01/21/1976  . Smokeless tobacco: Not on file  . Alcohol Use: No  . Drug Use: No  . Sexual Activity: Yes    Birth Control/ Protection: Post-menopausal     Comment: # 2 pregnancies and #2 live births   Other Topics Concern  . Not on file    Social History Narrative   Married, husband [name]   Has #2 adult children   Previously worked at [employer] HISTORY: Family History  Problem Relation Age of Onset  . COPD Father   . Heart attack Father   . Arthritis Sister   . Diabetes Sister   . Colon cancer Sister     dx over 43  . Colon polyps Sister   . Seizures Brother   . Colon cancer Sister     dx in her early 34s, died in her early to mid 65s  . Diabetes Mother     ALLERGIES:  has No Known Allergies.  MEDICATIONS:  Current Outpatient Prescriptions  Medication Sig Dispense Refill  . amLODipine (NORVASC) 5 MG tablet Take 2 tablets (10 mg total) by mouth daily. 30 tablet 0  . atorvastatin (LIPITOR) 40 MG tablet Take 40 mg by mouth daily.    . cetirizine (ZYRTEC) 10 MG tablet Take 10 mg by mouth daily as needed for allergies.    . ferrous sulfate 325 (65 FE) MG tablet Take 325 mg by mouth daily with breakfast.    . HUMALOG KWIKPEN 100 UNIT/ML KiwkPen     . Insulin Glargine (LANTUS SOLOSTAR) 100 UNIT/ML Solostar Pen Inject 23 Units into the skin daily at 10 pm.    . insulin lispro (HUMALOG) 100 UNIT/ML injection Inject 4-8 Units into the skin 2 (two) times daily as needed for high blood sugar. Sliding scale    . lidocaine-prilocaine (EMLA) cream     . ondansetron (ZOFRAN-ODT) 8 MG disintegrating tablet Take 8 mg by mouth every 8 (eight) hours as needed for nausea or vomiting.     No current facility-administered medications for this visit.    REVIEW OF SYSTEMS:   Constitutional: Denies fevers, chills or abnormal night sweats Eyes: Denies blurriness of vision, double vision or watery eyes Ears, nose, mouth, throat, and face: Denies mucositis or sore throat Respiratory: Denies cough, dyspnea or wheezes Cardiovascular: Denies palpitation, chest discomfort or lower extremity swelling Gastrointestinal:  Denies nausea, heartburn or change in bowel habits Skin: Denies abnormal skin rashes Lymphatics:  Denies new lymphadenopathy or easy bruising Neurological:Denies numbness, tingling or new weaknesses Behavioral/Psych: Mood is stable, no new changes  All other systems were reviewed with the patient and are negative.  PHYSICAL EXAMINATION: ECOG PERFORMANCE STATUS: 1 - Symptomatic but completely ambulatory BP 132/62 mmHg  Pulse 82  Temp(Src) 98.2 F (36.8 C) (Oral)  Resp 18  Ht '5\' 4"'$  (1.626 m)  Wt 162 lb 6.4 oz (73.664 kg)  BMI 27.86 kg/m2  SpO2 100% GENERAL:alert, no distress and comfortable SKIN: skin color, texture, turgor are normal, no rashes or significant lesions EYES: normal, conjunctiva are pink and non-injected, sclera clear OROPHARYNX:no exudate, no erythema and lips, buccal mucosa, and tongue normal  NECK: supple, thyroid normal size, non-tender, without nodularity LYMPH:  no palpable lymphadenopathy in the cervical,  axillary or inguinal LUNGS: clear to auscultation and percussion with normal breathing effort HEART: regular rate & rhythm and no murmurs and no lower extremity edema ABDOMEN:abdomen soft, non-tender and normal bowel sounds Musculoskeletal:no cyanosis of digits and no clubbing  PSYCH: alert & oriented x 3 with fluent speech NEURO: no focal motor/sensory deficits  LABORATORY DATA:  I have reviewed the data as listed CBC Latest Ref Rng 02/21/2015 02/07/2015 01/24/2015  WBC 3.9 - 10.3 10e3/uL 5.1 4.5 4.6  Hemoglobin 11.6 - 15.9 g/dL 10.0(L) 9.2(L) 9.8(L)  Hematocrit 34.8 - 46.6 % 30.7(L) 28.4(L) 31.2(L)  Platelets 145 - 400 10e3/uL 113(L) 110(L) 132(L)     Recent Labs  07/13/14 2051  08/11/14 2047 08/13/14 0558 08/14/14 0435  01/03/15 0943 01/24/15 0800 02/07/15 0820  NA  --   < > 133* 138 135  < > 137 141 139  K  --   < > 4.3 4.0 4.0  < > 4.7 4.4 4.8  CL  --   < > 98* 109 105  --   --   --   --   CO2  --   < > '26 26 25  '$ < > '23 26 24  '$ GLUCOSE  --   < > 97 110* 138*  < > 172* 66* 112  BUN  --   < > 22* 18 18  < > 15.8 30.9* 30.7*  CREATININE  --    < > 0.98 1.04* 0.94  < > 1.2* 1.1 1.3*  CALCIUM  --   < > 8.8* 8.2* 8.3*  < > 9.4 9.7 9.3  GFRNONAA  --   < > >60 57* >60  --   --   --   --   GFRAA  --   < > >60 >60 >60  --   --   --   --   PROT 7.6  < > 7.6 5.9* 6.5  < > 7.0 7.5 7.3  ALBUMIN 3.8  < > 3.7 2.7* 2.9*  < > 3.6 3.8 3.7  AST 65*  < > 70* 31 30  < > 27 25 36*  ALT 54  < > 51 28 28  < > 36 23 42  ALKPHOS 146*  < > 178* 134* 146*  < > 94 89 104  BILITOT 0.9  < > 0.7 1.0 0.7  < > 0.32 0.30 <0.30  BILIDIR 0.2  --   --   --  0.1  --   --   --   --   IBILI 0.7  --   --   --  0.6  --   --   --   --   < > = values in this interval not displayed.  CEA (Parallel Testing)  Status: Finalresult Visible to patient:  Not Released Nextappt: 02/23/2015 at 10:15 AM in Oncology ([facility] J32 DNS)              Ref Range 2wk ago  6moago  378mogo     CEA 0.0 - 5.0 ng/mL 6.0 (H) 6.6 (H) 18.2 (H)           PATHOLOGY REPORTS: Diagnosis 07/25/2014  Surgical [P], sigmoid - INVASIVE ADENOCARCINOMA ARISING IN A BACKGROUND OF HIGH GRADE DYSPLASIA. - SEE COMMENT. Microscopic Comment Results are phoned to Dr. BrOlevia PerchesInternal departmental review obtained (Dr. RuDonato Heinzwith agreement.  Diagnosis 09/05/2014 Liver, biopsy - METASTATIC ADENOCARCINOMA, SEE COMMENT. Microscopic Comment Given the patient's history of sigmoid invasive adenocarcinoma  and the morphology, the findings are consistent with metastatic colorectal adenocarcinoma. The case was called to Dr. Burr Medico. Per request, MMR, MSI, and KRAS testing will be ordered.  KRAS MUTATION (+) c36G>A (p. G12D)  RADIOGRAPHIC STUDIES: I have personally reviewed the radiological images as listed and agreed with the findings in the report.   CT chest, abdomen and pelvis with contrast 02/04/2014 IMPRESSION: 1. Improving hepatic metastatic disease. 2. Decrease in size of a perisigmoid soft tissue nodule. 3. Coronary artery calcification. 4.  Cholelithiasis.    Colonoscopy 07/25/2014 Dr. Olevia Perches  ENDOSCOPIC IMPRESSION: Near circumferential medium mass was found in the sigmoid colon; multiple biopsies were performed using cold forceps, partially obstructing mass consistent with carcinoma. Placement of endoclips and tattoo at the margins of the mass which extends from 18-25 cm from the anus  ASSESSMENT & PLAN: 60 year old African-American female, presented with intermittent nausea vomiting and anemia.   1. Sigmoid colon cancer with liver metastasis, TxNxM1, stage IV, MSI stable, KRAS mutation(+) -I previously reviewed her CT scans, colonoscopy, and colon mass biopsy findings with patient and her husband in great details, images were reviewed in person  -I reviewed her liver biopsy results, which confirmed metastasis from colon cancer. -The natural history of metastatic colon cancer and treatment options were discussed with patient and her husband. Giving the diffuse metastasis in the liver, this is unfortunately incurable disease. I recommend systemic chemotherapy to control her metastatic disease, the goal of therapy is palliative and to prolong her life. -The options of first-line chemotherapy were discussed with her, I recommend FOLFOX and Avastin every 2 weeks intravenously, and I will continue as long as she tolerates and no disease progression -her tumor MSI is stable, KRAS mutated, not a candidate for immunotherapy or EGFR inhibitor. -Her CEA has dropped significantly. I reviewed her restaging CT scan from 02/05/2015 which showed continuous partial response.  -she is tolerating chemo very well, will continue.  Today's lab results were reviewed with her, adequate for treatment.  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 -Her ferritin is normal,  serum iron and saturation slightly low, TIBC low normal, I suggest her to take oral iron supplement, she tolerates well, we'll continue once daily, with orange juice or vitamin C   3. DM, HTN -Continue follow-up with PCP - we discussed the potential impact of chemotherapy and premedication dexamethasone on her blood pressure and sugar,will  Monitor closely. Her BG has been well controlled  -Her blood pressure has been much better controlled after we increased her Amlodipine to '10mg'$  daily   4. AKI -Cr improved from 1.3  2 weeks ago to 1.1,  will continue monitoring renal function  -I encourage her to drink more fluids   5. Mild thrombocytopenia -Secondary to chemotherapy. -will monitor closely.  PLan -Lab reviewed, adequate for treatment. Continue FOLFOX plus Avastin today and every 2 weeks, no Neulasta on day 3 -I'll see her back in 4 weeks on 03/21/2015  All questions were answered. The patient knows to call the clinic with any problems, questions or concerns.  I spent 10 minutes counseling the patient face to face. The total time spent in the appointment was 15 minutes and more than 50% was on counseling.     Truitt Merle, MD 02/21/2015

## 2015-02-21 NOTE — Patient Instructions (Signed)
[facility] Discharge Instructions for Patients Receiving Chemotherapy  Today you received the following chemotherapy agents Oxaliplatin, Leucovorin, Adrucil  To help prevent nausea and vomiting after your treatment, we encourage you to take your nausea medication    If you develop nausea and vomiting that is not controlled by your nausea medication, call the clinic.   BELOW ARE SYMPTOMS THAT SHOULD BE REPORTED IMMEDIATELY:  *FEVER GREATER THAN 100.5 F  *CHILLS WITH OR WITHOUT FEVER  NAUSEA AND VOMITING THAT IS NOT CONTROLLED WITH YOUR NAUSEA MEDICATION  *UNUSUAL SHORTNESS OF BREATH  *UNUSUAL BRUISING OR BLEEDING  TENDERNESS IN MOUTH AND THROAT WITH OR WITHOUT PRESENCE OF ULCERS  *URINARY PROBLEMS  *BOWEL PROBLEMS  UNUSUAL RASH Items with * indicate a potential emergency and should be followed up as soon as possible.  Feel free to call the clinic you have any questions or concerns. The clinic phone number is [phone].  Please show the CHEMO ALERT CARD at check-in to the Emergency Department and triage nurse.   

## 2015-02-21 NOTE — Telephone Encounter (Signed)
per pof to sch pt appt-gave pt copy of avs °

## 2015-02-23 ENCOUNTER — Ambulatory Visit (HOSPITAL_BASED_OUTPATIENT_CLINIC_OR_DEPARTMENT_OTHER): Payer: BLUE CROSS/BLUE SHIELD

## 2015-02-23 VITALS — BP 156/70 | HR 87 | Temp 97.8°F | Resp 20

## 2015-02-23 DIAGNOSIS — C187 Malignant neoplasm of sigmoid colon: Secondary | ICD-10-CM | POA: Diagnosis not present

## 2015-02-23 DIAGNOSIS — C787 Secondary malignant neoplasm of liver and intrahepatic bile duct: Principal | ICD-10-CM

## 2015-02-23 DIAGNOSIS — Z452 Encounter for adjustment and management of vascular access device: Secondary | ICD-10-CM

## 2015-02-23 DIAGNOSIS — C189 Malignant neoplasm of colon, unspecified: Secondary | ICD-10-CM

## 2015-02-23 MED ORDER — HEPARIN SOD (PORK) LOCK FLUSH 100 UNIT/ML IV SOLN
500.0000 [IU] | Freq: Once | INTRAVENOUS | Status: AC | PRN
  Administered 2015-02-23: 500 [IU]
  Filled 2015-02-23: qty 5

## 2015-02-23 MED ORDER — SODIUM CHLORIDE 0.9 % IJ SOLN
10.0000 mL | INTRAMUSCULAR | Status: DC | PRN
  Administered 2015-02-23: 10 mL
  Filled 2015-02-23: qty 10

## 2015-02-23 NOTE — Progress Notes (Signed)
Pt here for pump d/c 2 hours early.  OK per Dr. Burr Medico to prime one hour (3.3 ml) of 5FU and discontinue pump early.

## 2015-02-23 NOTE — Patient Instructions (Signed)

## 2015-03-07 ENCOUNTER — Ambulatory Visit (HOSPITAL_BASED_OUTPATIENT_CLINIC_OR_DEPARTMENT_OTHER): Payer: BLUE CROSS/BLUE SHIELD

## 2015-03-07 ENCOUNTER — Other Ambulatory Visit (HOSPITAL_BASED_OUTPATIENT_CLINIC_OR_DEPARTMENT_OTHER): Payer: BLUE CROSS/BLUE SHIELD

## 2015-03-07 ENCOUNTER — Other Ambulatory Visit: Payer: Self-pay | Admitting: Hematology

## 2015-03-07 ENCOUNTER — Ambulatory Visit: Payer: BLUE CROSS/BLUE SHIELD | Admitting: Hematology

## 2015-03-07 ENCOUNTER — Other Ambulatory Visit: Payer: BLUE CROSS/BLUE SHIELD

## 2015-03-07 VITALS — BP 164/65 | HR 86 | Temp 98.3°F | Resp 18

## 2015-03-07 DIAGNOSIS — C787 Secondary malignant neoplasm of liver and intrahepatic bile duct: Secondary | ICD-10-CM

## 2015-03-07 DIAGNOSIS — Z5111 Encounter for antineoplastic chemotherapy: Secondary | ICD-10-CM

## 2015-03-07 DIAGNOSIS — Z5112 Encounter for antineoplastic immunotherapy: Secondary | ICD-10-CM

## 2015-03-07 DIAGNOSIS — C187 Malignant neoplasm of sigmoid colon: Secondary | ICD-10-CM | POA: Diagnosis not present

## 2015-03-07 DIAGNOSIS — C189 Malignant neoplasm of colon, unspecified: Secondary | ICD-10-CM

## 2015-03-07 LAB — COMPREHENSIVE METABOLIC PANEL
ALT: 32 U/L (ref 0–55)
ANION GAP: 9 meq/L (ref 3–11)
AST: 25 U/L (ref 5–34)
Albumin: 3.7 g/dL (ref 3.5–5.0)
Alkaline Phosphatase: 110 U/L (ref 40–150)
BILIRUBIN TOTAL: 0.36 mg/dL (ref 0.20–1.20)
BUN: 20.2 mg/dL (ref 7.0–26.0)
CHLORIDE: 106 meq/L (ref 98–109)
CO2: 23 meq/L (ref 22–29)
Calcium: 9.4 mg/dL (ref 8.4–10.4)
Creatinine: 1.3 mg/dL — ABNORMAL HIGH (ref 0.6–1.1)
EGFR: 53 mL/min/{1.73_m2} — AB (ref >90)
Glucose: 191 mg/dl — ABNORMAL HIGH (ref 70–140)
Potassium: 4.7 mEq/L (ref 3.5–5.1)
Sodium: 138 mEq/L (ref 136–145)
Total Protein: 7.5 g/dL (ref 6.4–8.3)

## 2015-03-07 LAB — CBC WITH DIFFERENTIAL/PLATELET
BASO%: 0.9 % (ref 0.0–2.0)
Basophils Absolute: 0 10*3/uL (ref 0.0–0.1)
EOS%: 5.2 % (ref 0.0–7.0)
Eosinophils Absolute: 0.3 10*3/uL (ref 0.0–0.5)
HCT: 31.2 % — ABNORMAL LOW (ref 34.8–46.6)
HGB: 10 g/dL — ABNORMAL LOW (ref 11.6–15.9)
LYMPH#: 1.1 10*3/uL (ref 0.9–3.3)
LYMPH%: 21.1 % (ref 14.0–49.7)
MCH: 30.9 pg (ref 25.1–34.0)
MCHC: 32.2 g/dL (ref 31.5–36.0)
MCV: 95.9 fL (ref 79.5–101.0)
MONO#: 0.7 10*3/uL (ref 0.1–0.9)
MONO%: 14.1 % — ABNORMAL HIGH (ref 0.0–14.0)
NEUT%: 58.7 % (ref 38.4–76.8)
NEUTROS ABS: 3 10*3/uL (ref 1.5–6.5)
PLATELETS: 126 10*3/uL — AB (ref 145–400)
RBC: 3.25 10*6/uL — AB (ref 3.70–5.45)
RDW: 16.8 % — ABNORMAL HIGH (ref 11.2–14.5)
WBC: 5.1 10*3/uL (ref 3.9–10.3)

## 2015-03-07 LAB — UA PROTEIN, DIPSTICK - CHCC: PROTEIN: 100 mg/dL

## 2015-03-07 MED ORDER — DEXTROSE 5 % IV SOLN
Freq: Once | INTRAVENOUS | Status: AC
  Administered 2015-03-07: 11:00:00 via INTRAVENOUS

## 2015-03-07 MED ORDER — SODIUM CHLORIDE 0.9 % IV SOLN
Freq: Once | INTRAVENOUS | Status: AC
  Administered 2015-03-07: 10:00:00 via INTRAVENOUS

## 2015-03-07 MED ORDER — LEUCOVORIN CALCIUM INJECTION 350 MG
400.0000 mg/m2 | Freq: Once | INTRAVENOUS | Status: AC
  Administered 2015-03-07: 716 mg via INTRAVENOUS
  Filled 2015-03-07: qty 35.8

## 2015-03-07 MED ORDER — SODIUM CHLORIDE 0.9 % IV SOLN
Freq: Once | INTRAVENOUS | Status: AC
  Administered 2015-03-07: 10:00:00 via INTRAVENOUS
  Filled 2015-03-07: qty 4

## 2015-03-07 MED ORDER — OXALIPLATIN CHEMO INJECTION 100 MG/20ML
70.0000 mg/m2 | Freq: Once | INTRAVENOUS | Status: AC
  Administered 2015-03-07: 125 mg via INTRAVENOUS
  Filled 2015-03-07: qty 20

## 2015-03-07 MED ORDER — SODIUM CHLORIDE 0.9 % IV SOLN
500.0000 mL | Freq: Once | INTRAVENOUS | Status: AC
  Administered 2015-03-07: 500 mL via INTRAVENOUS

## 2015-03-07 MED ORDER — SODIUM CHLORIDE 0.9 % IV SOLN
2200.0000 mg/m2 | INTRAVENOUS | Status: DC
  Administered 2015-03-07: 3950 mg via INTRAVENOUS
  Filled 2015-03-07: qty 79

## 2015-03-07 MED ORDER — SODIUM CHLORIDE 0.9 % IV SOLN
5.0000 mg/kg | Freq: Once | INTRAVENOUS | Status: AC
  Administered 2015-03-07: 350 mg via INTRAVENOUS
  Filled 2015-03-07: qty 14

## 2015-03-07 NOTE — Progress Notes (Signed)
Dr. Burr Medico reviewed ALL lab results today.  OK to treat with CREA  1.3 ;  Urine protein   100   ;   Pt  To receive additional IVF with chemo today as per Dr. Burr Medico.

## 2015-03-07 NOTE — Patient Instructions (Signed)
[facility] Discharge Instructions for Patients Receiving Chemotherapy  Today you received the following chemotherapy agents: Avastin, Leucovorin, Oxaliplatin, and Adrucil   To help prevent nausea and vomiting after your treatment, we encourage you to take your nausea medication as directed.   If you develop nausea and vomiting that is not controlled by your nausea medication, call the clinic.   BELOW ARE SYMPTOMS THAT SHOULD BE REPORTED IMMEDIATELY:  *FEVER GREATER THAN 100.5 F  *CHILLS WITH OR WITHOUT FEVER  NAUSEA AND VOMITING THAT IS NOT CONTROLLED WITH YOUR NAUSEA MEDICATION  *UNUSUAL SHORTNESS OF BREATH  *UNUSUAL BRUISING OR BLEEDING  TENDERNESS IN MOUTH AND THROAT WITH OR WITHOUT PRESENCE OF ULCERS  *URINARY PROBLEMS  *BOWEL PROBLEMS  UNUSUAL RASH Items with * indicate a potential emergency and should be followed up as soon as possible.  Feel free to call the clinic you have any questions or concerns. The clinic phone number is [phone].  Please show the [place] at check-in to the Emergency Department and triage nurse.

## 2015-03-07 NOTE — Progress Notes (Signed)
Per Thu RN per Dr. Burr Medico okay to treat with labs.

## 2015-03-08 LAB — CEA: CEA: 17.7 ng/mL — ABNORMAL HIGH (ref 0.0–4.7)

## 2015-03-08 LAB — CEA (PARALLEL TESTING): CEA: 6.6 ng/mL

## 2015-03-09 ENCOUNTER — Ambulatory Visit (HOSPITAL_BASED_OUTPATIENT_CLINIC_OR_DEPARTMENT_OTHER): Payer: BLUE CROSS/BLUE SHIELD

## 2015-03-09 VITALS — BP 132/72 | HR 90 | Temp 97.8°F | Resp 18

## 2015-03-09 DIAGNOSIS — C787 Secondary malignant neoplasm of liver and intrahepatic bile duct: Principal | ICD-10-CM

## 2015-03-09 DIAGNOSIS — Z452 Encounter for adjustment and management of vascular access device: Secondary | ICD-10-CM | POA: Diagnosis not present

## 2015-03-09 DIAGNOSIS — C189 Malignant neoplasm of colon, unspecified: Secondary | ICD-10-CM

## 2015-03-09 DIAGNOSIS — C187 Malignant neoplasm of sigmoid colon: Secondary | ICD-10-CM

## 2015-03-09 MED ORDER — HEPARIN SOD (PORK) LOCK FLUSH 100 UNIT/ML IV SOLN
500.0000 [IU] | Freq: Once | INTRAVENOUS | Status: AC | PRN
  Administered 2015-03-09: 500 [IU]
  Filled 2015-03-09: qty 5

## 2015-03-09 MED ORDER — SODIUM CHLORIDE 0.9 % IJ SOLN
10.0000 mL | INTRAMUSCULAR | Status: DC | PRN
  Administered 2015-03-09: 10 mL
  Filled 2015-03-09: qty 10

## 2015-03-17 ENCOUNTER — Telehealth: Payer: Self-pay | Admitting: Hematology

## 2015-03-17 NOTE — Telephone Encounter (Signed)
Due to YF out moved 3/1 f/u to CB. Patient has tx and no availability to accommodate f//u with LT. Spoke with  Patient she is aware of change and new time for 3/1 @ 8:30 am.

## 2015-03-18 ENCOUNTER — Other Ambulatory Visit: Payer: Self-pay | Admitting: Hematology

## 2015-03-21 ENCOUNTER — Ambulatory Visit (HOSPITAL_BASED_OUTPATIENT_CLINIC_OR_DEPARTMENT_OTHER): Payer: BLUE CROSS/BLUE SHIELD | Admitting: Nurse Practitioner

## 2015-03-21 ENCOUNTER — Ambulatory Visit (HOSPITAL_BASED_OUTPATIENT_CLINIC_OR_DEPARTMENT_OTHER): Payer: BLUE CROSS/BLUE SHIELD

## 2015-03-21 ENCOUNTER — Telehealth: Payer: Self-pay | Admitting: Nurse Practitioner

## 2015-03-21 ENCOUNTER — Other Ambulatory Visit (HOSPITAL_BASED_OUTPATIENT_CLINIC_OR_DEPARTMENT_OTHER): Payer: BLUE CROSS/BLUE SHIELD

## 2015-03-21 VITALS — BP 155/69 | HR 87 | Temp 98.2°F | Resp 18 | Ht 64.0 in | Wt 162.1 lb

## 2015-03-21 DIAGNOSIS — C189 Malignant neoplasm of colon, unspecified: Secondary | ICD-10-CM

## 2015-03-21 DIAGNOSIS — Z5112 Encounter for antineoplastic immunotherapy: Secondary | ICD-10-CM | POA: Diagnosis not present

## 2015-03-21 DIAGNOSIS — C787 Secondary malignant neoplasm of liver and intrahepatic bile duct: Secondary | ICD-10-CM | POA: Diagnosis not present

## 2015-03-21 DIAGNOSIS — R1115 Cyclical vomiting syndrome unrelated to migraine: Secondary | ICD-10-CM

## 2015-03-21 DIAGNOSIS — C187 Malignant neoplasm of sigmoid colon: Secondary | ICD-10-CM

## 2015-03-21 LAB — COMPREHENSIVE METABOLIC PANEL
ALBUMIN: 3.6 g/dL (ref 3.5–5.0)
ALK PHOS: 92 U/L (ref 40–150)
ALT: 57 U/L — ABNORMAL HIGH (ref 0–55)
AST: 40 U/L — AB (ref 5–34)
Anion Gap: 9 mEq/L (ref 3–11)
BUN: 23.1 mg/dL (ref 7.0–26.0)
CALCIUM: 9.4 mg/dL (ref 8.4–10.4)
CHLORIDE: 108 meq/L (ref 98–109)
CO2: 24 mEq/L (ref 22–29)
Creatinine: 1.1 mg/dL (ref 0.6–1.1)
EGFR: 60 mL/min/{1.73_m2} — AB (ref >90)
Glucose: 124 mg/dl (ref 70–140)
POTASSIUM: 4.4 meq/L (ref 3.5–5.1)
SODIUM: 141 meq/L (ref 136–145)
Total Bilirubin: 0.34 mg/dL (ref 0.20–1.20)
Total Protein: 7.2 g/dL (ref 6.4–8.3)

## 2015-03-21 LAB — CBC WITH DIFFERENTIAL/PLATELET
BASO%: 0.6 % (ref 0.0–2.0)
BASOS ABS: 0 10*3/uL (ref 0.0–0.1)
EOS ABS: 0.3 10*3/uL (ref 0.0–0.5)
EOS%: 5.6 % (ref 0.0–7.0)
HEMATOCRIT: 30.9 % — AB (ref 34.8–46.6)
HEMOGLOBIN: 10.1 g/dL — AB (ref 11.6–15.9)
LYMPH%: 22 % (ref 14.0–49.7)
MCH: 31.3 pg (ref 25.1–34.0)
MCHC: 32.7 g/dL (ref 31.5–36.0)
MCV: 95.8 fL (ref 79.5–101.0)
MONO#: 0.8 10*3/uL (ref 0.1–0.9)
MONO%: 17.1 % — AB (ref 0.0–14.0)
NEUT#: 2.7 10*3/uL (ref 1.5–6.5)
NEUT%: 54.7 % (ref 38.4–76.8)
Platelets: 107 10*3/uL — ABNORMAL LOW (ref 145–400)
RBC: 3.23 10*6/uL — ABNORMAL LOW (ref 3.70–5.45)
RDW: 16.7 % — ABNORMAL HIGH (ref 11.2–14.5)
WBC: 4.9 10*3/uL (ref 3.9–10.3)
lymph#: 1.1 10*3/uL (ref 0.9–3.3)

## 2015-03-21 MED ORDER — SODIUM CHLORIDE 0.9 % IV SOLN
5.0000 mg/kg | Freq: Once | INTRAVENOUS | Status: AC
  Administered 2015-03-21: 350 mg via INTRAVENOUS
  Filled 2015-03-21: qty 14

## 2015-03-21 MED ORDER — DEXTROSE 5 % IV SOLN
Freq: Once | INTRAVENOUS | Status: AC
  Administered 2015-03-21: 10:00:00 via INTRAVENOUS

## 2015-03-21 MED ORDER — SODIUM CHLORIDE 0.9 % IV SOLN
Freq: Once | INTRAVENOUS | Status: AC
  Administered 2015-03-21: 11:00:00 via INTRAVENOUS

## 2015-03-21 MED ORDER — SODIUM CHLORIDE 0.9 % IV SOLN
2200.0000 mg/m2 | INTRAVENOUS | Status: DC
  Administered 2015-03-21: 3950 mg via INTRAVENOUS
  Filled 2015-03-21: qty 79

## 2015-03-21 MED ORDER — ONDANSETRON HCL 40 MG/20ML IJ SOLN
Freq: Once | INTRAMUSCULAR | Status: AC
  Administered 2015-03-21: 10:00:00 via INTRAVENOUS
  Filled 2015-03-21: qty 4

## 2015-03-21 MED ORDER — LEUCOVORIN CALCIUM INJECTION 350 MG
400.0000 mg/m2 | Freq: Once | INTRAVENOUS | Status: AC
  Administered 2015-03-21: 716 mg via INTRAVENOUS
  Filled 2015-03-21: qty 35.8

## 2015-03-21 MED ORDER — OXALIPLATIN CHEMO INJECTION 100 MG/20ML
70.0000 mg/m2 | Freq: Once | INTRAVENOUS | Status: AC
  Administered 2015-03-21: 125 mg via INTRAVENOUS
  Filled 2015-03-21: qty 5

## 2015-03-21 NOTE — Patient Instructions (Signed)
[facility] Discharge Instructions for Patients Receiving Chemotherapy  Today you received the following chemotherapy agents:  Leucovorin, Oxaliplatin, 5FU, and Avastin.  To help prevent nausea and vomiting after your treatment, we encourage you to take your nausea medication as prescribed.   If you develop nausea and vomiting that is not controlled by your nausea medication, call the clinic.   BELOW ARE SYMPTOMS THAT SHOULD BE REPORTED IMMEDIATELY:  *FEVER GREATER THAN 100.5 F  *CHILLS WITH OR WITHOUT FEVER  NAUSEA AND VOMITING THAT IS NOT CONTROLLED WITH YOUR NAUSEA MEDICATION  *UNUSUAL SHORTNESS OF BREATH  *UNUSUAL BRUISING OR BLEEDING  TENDERNESS IN MOUTH AND THROAT WITH OR WITHOUT PRESENCE OF ULCERS  *URINARY PROBLEMS  *BOWEL PROBLEMS  UNUSUAL RASH Items with * indicate a potential emergency and should be followed up as soon as possible.  Feel free to call the clinic you have any questions or concerns. The clinic phone number is [phone].  Please show the [place] at check-in to the Emergency Department and triage nurse.

## 2015-03-21 NOTE — Telephone Encounter (Signed)
per pof to sch pt appt-sent MW email to get updated copy of avs-pt to get updated copy b4 leaving

## 2015-03-22 ENCOUNTER — Ambulatory Visit: Payer: BLUE CROSS/BLUE SHIELD | Admitting: Hematology

## 2015-03-23 ENCOUNTER — Ambulatory Visit (HOSPITAL_BASED_OUTPATIENT_CLINIC_OR_DEPARTMENT_OTHER): Payer: BLUE CROSS/BLUE SHIELD

## 2015-03-23 VITALS — BP 132/70 | HR 86 | Temp 98.1°F | Resp 18

## 2015-03-23 DIAGNOSIS — C187 Malignant neoplasm of sigmoid colon: Secondary | ICD-10-CM | POA: Diagnosis not present

## 2015-03-23 DIAGNOSIS — C189 Malignant neoplasm of colon, unspecified: Secondary | ICD-10-CM

## 2015-03-23 DIAGNOSIS — Z452 Encounter for adjustment and management of vascular access device: Secondary | ICD-10-CM

## 2015-03-23 DIAGNOSIS — C787 Secondary malignant neoplasm of liver and intrahepatic bile duct: Principal | ICD-10-CM

## 2015-03-23 MED ORDER — SODIUM CHLORIDE 0.9 % IJ SOLN
10.0000 mL | INTRAMUSCULAR | Status: DC | PRN
  Administered 2015-03-23: 10 mL
  Filled 2015-03-23: qty 10

## 2015-03-23 MED ORDER — HEPARIN SOD (PORK) LOCK FLUSH 100 UNIT/ML IV SOLN
500.0000 [IU] | Freq: Once | INTRAVENOUS | Status: AC | PRN
  Administered 2015-03-23: 500 [IU]
  Filled 2015-03-23: qty 5

## 2015-03-23 NOTE — Patient Instructions (Signed)

## 2015-03-24 ENCOUNTER — Encounter: Payer: Self-pay | Admitting: Nurse Practitioner

## 2015-03-24 NOTE — Progress Notes (Signed)
SYMPTOM MANAGEMENT CLINIC   HPI: Michelle Erickson 60 y.o. female diagnosed with colon cancer with liver metastasis.  Currently on FOLFOX/Avastin chemotherapy regimen.   Patient presented to the [facility] today to receive her FOLFOX/Avastin chemotherapy regimen.  Patient states she's been feeling fairly well recently; with no new complaints.  She denies any recent fevers or chills.  Blood counts obtained today were all within normal limits.  Vital signs were stable and patient was afebrile.  Patient will proceed today with cycle 16 of her FOLFOX chemotherapy and cycle 14 of her Avastin as planned.  Patient will not receive Neulasta for growth factor support today.  Patient will return on 03/23/2015.  Discontinuation.  She will return on 04/04/2015 for labs,/, visit, and her next chemotherapy.  HPI  ROS  Past Medical History  Diagnosis Date  . Hyperlipemia   . Hypertension   . Metastatic colon cancer to liver ([place]) 07/21/2014  . Wears glasses     contacts/glasses  . Snoring   . Back pain   . Family history of colon cancer   . Diabetes mellitus without complication [facility])     Past Surgical History  Procedure Laterality Date  . Mouth surgery    . Cataract extraction Bilateral   . Portacath placement N/A 08/03/2014    Procedure: INSERTION PORT-A-CATH;  Surgeon: Stark Klein, MD;  Location: [place];  Service: General;  Laterality: N/A;    has Metastatic colon cancer to liver ([place]); Family history of colon cancer; DM (diabetes mellitus), type 2 ([place]); and Genetic testing on her problem list.    has No Known Allergies.    Medication List       This list is accurate as of: 03/21/15 11:59 PM.  Always use your most recent med list.               amLODipine 5 MG tablet  Commonly known as:  NORVASC  Take 2 tablets (10 mg total) by mouth daily.     cetirizine 10 MG tablet  Commonly known as:  ZYRTEC  Take 10 mg by mouth daily as needed for allergies. Reported on 03/21/2015     ferrous sulfate 325 (65 FE) MG tablet  Take 325 mg by mouth daily with breakfast.     insulin lispro 100 UNIT/ML injection  Commonly known as:  HUMALOG  Inject 4-8 Units into the skin 2 (two) times daily as needed for high blood sugar. Sliding scale     HUMALOG KWIKPEN 100 UNIT/ML KiwkPen  Generic drug:  insulin lispro     LANTUS SOLOSTAR 100 UNIT/ML Solostar Pen  Generic drug:  Insulin Glargine  Inject 23 Units into the skin daily at 10 pm.     lidocaine-prilocaine cream  Commonly known as:  EMLA     LIPITOR 40 MG tablet  Generic drug:  atorvastatin  Take 40 mg by mouth daily.     ondansetron 8 MG disintegrating tablet  Commonly known as:  ZOFRAN-ODT  Take 8 mg by mouth every 8 (eight) hours as needed for nausea or vomiting. Reported on 03/21/2015         PHYSICAL EXAMINATION  Oncology Vitals 03/23/2015 03/21/2015  Height - 163 cm  Weight - 73.528 kg  Weight (lbs) - 162 lbs 2 oz  BMI (kg/m2) - 27.82 kg/m2  Temp 98.1 98.2  Pulse 86 87  Resp 18 18  SpO2 100 100  BSA (m2) - 1.82 m2   BP Readings from Last 2 Encounters:  03/23/15 132/70  03/21/15 155/69    Physical Exam  Constitutional: She is oriented to person, place, and time and well-developed, well-nourished, and in no distress.  HENT:  Head: Normocephalic and atraumatic.  Mouth/Throat: Oropharynx is clear and moist.  Eyes: Conjunctivae and EOM are normal. Pupils are equal, round, and reactive to light. Right eye exhibits no discharge. Left eye exhibits no discharge. No scleral icterus.  Neck: Normal range of motion. Neck supple. No JVD present. No tracheal deviation present. No thyromegaly present.  Cardiovascular: Normal rate, regular rhythm, normal heart sounds and intact distal pulses.   Pulmonary/Chest: Effort normal and breath sounds normal. No respiratory distress. She has no wheezes. She has no rales. She exhibits no tenderness.  Abdominal: Soft. Bowel sounds are normal. She exhibits no distension and no  mass. There is no tenderness. There is no rebound and no guarding.  Musculoskeletal: Normal range of motion. She exhibits no edema or tenderness.  Lymphadenopathy:    She has no cervical adenopathy.  Neurological: She is alert and oriented to person, place, and time. Gait normal.  Skin: Skin is warm and dry. No rash noted. No erythema. No pallor.  Psychiatric: Affect normal.    LABORATORY DATA:. Appointment on 03/21/2015  Component Date Value Ref Range Status  . WBC 03/21/2015 4.9  3.9 - 10.3 10e3/uL Final  . NEUT# 03/21/2015 2.7  1.5 - 6.5 10e3/uL Final  . HGB 03/21/2015 10.1* 11.6 - 15.9 g/dL Final  . HCT 03/21/2015 30.9* 34.8 - 46.6 % Final  . Platelets 03/21/2015 107* 145 - 400 10e3/uL Final  . MCV 03/21/2015 95.8  79.5 - 101.0 fL Final  . MCH 03/21/2015 31.3  25.1 - 34.0 pg Final  . MCHC 03/21/2015 32.7  31.5 - 36.0 g/dL Final  . RBC 03/21/2015 3.23* 3.70 - 5.45 10e6/uL Final  . RDW 03/21/2015 16.7* 11.2 - 14.5 % Final  . lymph# 03/21/2015 1.1  0.9 - 3.3 10e3/uL Final  . MONO# 03/21/2015 0.8  0.1 - 0.9 10e3/uL Final  . Eosinophils Absolute 03/21/2015 0.3  0.0 - 0.5 10e3/uL Final  . Basophils Absolute 03/21/2015 0.0  0.0 - 0.1 10e3/uL Final  . NEUT% 03/21/2015 54.7  38.4 - 76.8 % Final  . LYMPH% 03/21/2015 22.0  14.0 - 49.7 % Final  . MONO% 03/21/2015 17.1* 0.0 - 14.0 % Final  . EOS% 03/21/2015 5.6  0.0 - 7.0 % Final  . BASO% 03/21/2015 0.6  0.0 - 2.0 % Final  . Sodium 03/21/2015 141  136 - 145 mEq/L Final  . Potassium 03/21/2015 4.4  3.5 - 5.1 mEq/L Final  . Chloride 03/21/2015 108  98 - 109 mEq/L Final  . CO2 03/21/2015 24  22 - 29 mEq/L Final  . Glucose 03/21/2015 124  70 - 140 mg/dl Final   Glucose reference range is for nonfasting patients. Fasting glucose reference range is 70- 100.  Marland Kitchen BUN 03/21/2015 23.1  7.0 - 26.0 mg/dL Final  . Creatinine 03/21/2015 1.1  0.6 - 1.1 mg/dL Final  . Total Bilirubin 03/21/2015 0.34  0.20 - 1.20 mg/dL Final  . Alkaline Phosphatase  03/21/2015 92  40 - 150 U/L Final  . AST 03/21/2015 40* 5 - 34 U/L Final  . ALT 03/21/2015 57* 0 - 55 U/L Final  . Total Protein 03/21/2015 7.2  6.4 - 8.3 g/dL Final  . Albumin 03/21/2015 3.6  3.5 - 5.0 g/dL Final  . Calcium 03/21/2015 9.4  8.4 - 10.4 mg/dL Final  . Anion Gap  03/21/2015 9  3 - 11 mEq/L Final  . EGFR 03/21/2015 60* >90 ml/min/1.73 m2 Final   eGFR is calculated using the CKD-EPI Creatinine Equation (2009)     RADIOGRAPHIC STUDIES: No results found.  ASSESSMENT/PLAN:    Metastatic colon cancer to liver [facility]) Patient presented to the [place] today to receive her FOLFOX/Avastin chemotherapy regimen.  Patient states she's been feeling fairly well recently; with no new complaints.  She denies any recent fevers or chills.  Blood counts obtained today were all within normal limits.  Vital signs were stable and patient was afebrile.  Patient will proceed today with cycle 16 of her FOLFOX chemotherapy and cycle 14 of her Avastin as planned.  Patient will not receive Neulasta for growth factor support today.  Patient will return on 03/23/2015.  Discontinuation.  She will return on 04/04/2015 for labs,/, visit, and her next chemotherapy.  Patient stated understanding of all instructions; and was in agreement with this plan of care. The patient knows to call the clinic with any problems, questions or concerns.   Review/collaboration with Dr. Burr Medico regarding all aspects of patient's visit today.   Total time spent with patient was 25 minutes;  with greater than 75 percent of that time spent in face to face counseling regarding patient's symptoms,  and coordination of care and follow up.  Disclaimer:This dictation was prepared with Dragon/digital dictation along with [employer]. Any transcriptional errors that result from this process are unintentional.  Drue Second, NP 03/24/2015

## 2015-03-24 NOTE — Assessment & Plan Note (Signed)
Patient presented to the [facility] today to receive her FOLFOX/Avastin chemotherapy regimen.  Patient states she's been feeling fairly well recently; with no new complaints.  She denies any recent fevers or chills.  Blood counts obtained today were all within normal limits.  Vital signs were stable and patient was afebrile.  Patient will proceed today with cycle 16 of her FOLFOX chemotherapy and cycle 14 of her Avastin as planned.  Patient will not receive Neulasta for growth factor support today.  Patient will return on 03/23/2015.  Discontinuation.  She will return on 04/04/2015 for labs,/, visit, and her next chemotherapy.

## 2015-04-04 ENCOUNTER — Ambulatory Visit (HOSPITAL_BASED_OUTPATIENT_CLINIC_OR_DEPARTMENT_OTHER): Payer: BLUE CROSS/BLUE SHIELD

## 2015-04-04 ENCOUNTER — Telehealth: Payer: Self-pay | Admitting: Hematology

## 2015-04-04 ENCOUNTER — Telehealth: Payer: Self-pay | Admitting: *Deleted

## 2015-04-04 ENCOUNTER — Other Ambulatory Visit (HOSPITAL_BASED_OUTPATIENT_CLINIC_OR_DEPARTMENT_OTHER): Payer: BLUE CROSS/BLUE SHIELD

## 2015-04-04 ENCOUNTER — Encounter: Payer: Self-pay | Admitting: Hematology

## 2015-04-04 ENCOUNTER — Ambulatory Visit (HOSPITAL_BASED_OUTPATIENT_CLINIC_OR_DEPARTMENT_OTHER): Payer: BLUE CROSS/BLUE SHIELD | Admitting: Hematology

## 2015-04-04 VITALS — BP 126/73 | HR 86 | Temp 97.9°F | Resp 18 | Ht 64.0 in

## 2015-04-04 DIAGNOSIS — Z95828 Presence of other vascular implants and grafts: Secondary | ICD-10-CM

## 2015-04-04 DIAGNOSIS — C787 Secondary malignant neoplasm of liver and intrahepatic bile duct: Secondary | ICD-10-CM | POA: Diagnosis not present

## 2015-04-04 DIAGNOSIS — D6959 Other secondary thrombocytopenia: Secondary | ICD-10-CM

## 2015-04-04 DIAGNOSIS — D630 Anemia in neoplastic disease: Secondary | ICD-10-CM

## 2015-04-04 DIAGNOSIS — N179 Acute kidney failure, unspecified: Secondary | ICD-10-CM

## 2015-04-04 DIAGNOSIS — I10 Essential (primary) hypertension: Secondary | ICD-10-CM

## 2015-04-04 DIAGNOSIS — E119 Type 2 diabetes mellitus without complications: Secondary | ICD-10-CM

## 2015-04-04 DIAGNOSIS — C189 Malignant neoplasm of colon, unspecified: Secondary | ICD-10-CM

## 2015-04-04 DIAGNOSIS — D6481 Anemia due to antineoplastic chemotherapy: Secondary | ICD-10-CM

## 2015-04-04 DIAGNOSIS — Z5112 Encounter for antineoplastic immunotherapy: Secondary | ICD-10-CM

## 2015-04-04 DIAGNOSIS — Z452 Encounter for adjustment and management of vascular access device: Secondary | ICD-10-CM | POA: Diagnosis not present

## 2015-04-04 DIAGNOSIS — C187 Malignant neoplasm of sigmoid colon: Secondary | ICD-10-CM

## 2015-04-04 DIAGNOSIS — Z5111 Encounter for antineoplastic chemotherapy: Secondary | ICD-10-CM

## 2015-04-04 LAB — CBC WITH DIFFERENTIAL/PLATELET
BASO%: 0.8 % (ref 0.0–2.0)
Basophils Absolute: 0 10*3/uL (ref 0.0–0.1)
EOS ABS: 0.2 10*3/uL (ref 0.0–0.5)
EOS%: 3.6 % (ref 0.0–7.0)
HCT: 30.5 % — ABNORMAL LOW (ref 34.8–46.6)
HGB: 9.8 g/dL — ABNORMAL LOW (ref 11.6–15.9)
LYMPH%: 20.4 % (ref 14.0–49.7)
MCH: 30.7 pg (ref 25.1–34.0)
MCHC: 32 g/dL (ref 31.5–36.0)
MCV: 96 fL (ref 79.5–101.0)
MONO#: 0.5 10*3/uL (ref 0.1–0.9)
MONO%: 11.5 % (ref 0.0–14.0)
NEUT%: 63.7 % (ref 38.4–76.8)
NEUTROS ABS: 2.7 10*3/uL (ref 1.5–6.5)
Platelets: 132 10*3/uL — ABNORMAL LOW (ref 145–400)
RBC: 3.18 10*6/uL — AB (ref 3.70–5.45)
RDW: 16.3 % — ABNORMAL HIGH (ref 11.2–14.5)
WBC: 4.3 10*3/uL (ref 3.9–10.3)
lymph#: 0.9 10*3/uL (ref 0.9–3.3)

## 2015-04-04 LAB — COMPREHENSIVE METABOLIC PANEL
ALT: 36 U/L (ref 0–55)
AST: 27 U/L (ref 5–34)
Albumin: 3.5 g/dL (ref 3.5–5.0)
Alkaline Phosphatase: 103 U/L (ref 40–150)
Anion Gap: 7 mEq/L (ref 3–11)
BUN: 28 mg/dL — ABNORMAL HIGH (ref 7.0–26.0)
CO2: 24 meq/L (ref 22–29)
Calcium: 9.2 mg/dL (ref 8.4–10.4)
Chloride: 107 mEq/L (ref 98–109)
Creatinine: 1.3 mg/dL — ABNORMAL HIGH (ref 0.6–1.1)
EGFR: 51 mL/min/{1.73_m2} — AB (ref >90)
GLUCOSE: 175 mg/dL — AB (ref 70–140)
Potassium: 4.5 mEq/L (ref 3.5–5.1)
SODIUM: 138 meq/L (ref 136–145)
TOTAL PROTEIN: 7.2 g/dL (ref 6.4–8.3)

## 2015-04-04 MED ORDER — SODIUM CHLORIDE 0.9 % IV SOLN
Freq: Once | INTRAVENOUS | Status: AC
  Administered 2015-04-04: 12:00:00 via INTRAVENOUS
  Filled 2015-04-04: qty 4

## 2015-04-04 MED ORDER — SODIUM CHLORIDE 0.9 % IV SOLN
500.0000 mL | Freq: Once | INTRAVENOUS | Status: AC
  Administered 2015-04-04: 500 mL via INTRAVENOUS

## 2015-04-04 MED ORDER — OXALIPLATIN CHEMO INJECTION 100 MG/20ML
70.0000 mg/m2 | Freq: Once | INTRAVENOUS | Status: AC
  Administered 2015-04-04: 125 mg via INTRAVENOUS
  Filled 2015-04-04: qty 20

## 2015-04-04 MED ORDER — FLUOROURACIL CHEMO INJECTION 5 GM/100ML
2200.0000 mg/m2 | INTRAVENOUS | Status: DC
  Administered 2015-04-04: 3950 mg via INTRAVENOUS
  Filled 2015-04-04: qty 79

## 2015-04-04 MED ORDER — LEUCOVORIN CALCIUM INJECTION 350 MG
400.0000 mg/m2 | Freq: Once | INTRAVENOUS | Status: AC
  Administered 2015-04-04: 716 mg via INTRAVENOUS
  Filled 2015-04-04: qty 35.8

## 2015-04-04 MED ORDER — SODIUM CHLORIDE 0.9% FLUSH
10.0000 mL | INTRAVENOUS | Status: DC | PRN
  Administered 2015-04-04: 10 mL via INTRAVENOUS
  Filled 2015-04-04: qty 10

## 2015-04-04 MED ORDER — DEXTROSE 5 % IV SOLN
Freq: Once | INTRAVENOUS | Status: AC
  Administered 2015-04-04: 13:00:00 via INTRAVENOUS

## 2015-04-04 MED ORDER — SODIUM CHLORIDE 0.9 % IV SOLN
Freq: Once | INTRAVENOUS | Status: AC
  Administered 2015-04-04: 12:00:00 via INTRAVENOUS

## 2015-04-04 MED ORDER — SODIUM CHLORIDE 0.9 % IV SOLN
5.0000 mg/kg | Freq: Once | INTRAVENOUS | Status: AC
  Administered 2015-04-04: 350 mg via INTRAVENOUS
  Filled 2015-04-04: qty 14

## 2015-04-04 NOTE — Telephone Encounter (Signed)
sch pt appt per  pof-sent MW meailto sch trmt-pt to get updated copy b4 leaving

## 2015-04-04 NOTE — Progress Notes (Signed)
Pt saw Dr. Burr Medico prior to chemo today.  OK to treat with Crea  1.3 ;  Pt to receive 500 ml of Normal Saline with treatment today.

## 2015-04-04 NOTE — Progress Notes (Signed)
[place]  [phone] [phone]  Clinic Follow Up Note   Patient Care Team: Dione Housekeeper, MD as PCP - General (Family Medicine) Tania Ade, RN as Registered Nurse (Oncology) 04/04/2015  CHIEF COMPLAINTS:  Follow-up of metastatic colon cancer  Oncology History   Metastatic colon cancer to liver   Staging form: Colon and Rectum, AJCC 7th Edition     Clinical: Stage Unknown ([place], NX, M1) - Unsigned        Metastatic colon cancer to liver ([place])   07/14/2014 Imaging CT abdomen and pelvis with contrast showed a 6 cm segment of sigmoid bowel wall thickening with adjacent 14 x 12 mm nodules, diffuse hepatic metastasis. No bowel obstruction.   07/14/2014 Initial Diagnosis Metastatic colon cancer to liver   07/25/2014 Procedure Colonoscopy showed a near circumferential medial mass in the sigmoid colon, partially obstructing consistent with carcinoma.   07/25/2014 Initial Biopsy Sigmoid: Biopsy showed invasive adenocarcinoma arising in a background of high-grade dysplasia.   07/28/2014 Imaging CT chest showed no evidence of metastasis   08/09/2014 -  Chemotherapy mFOLFOX6 every 2 weeks, dose reduction from cycle 7 due to cytopenia, Avastin added from cycle 3   08/11/2014 - 08/15/2014 Hospital Admission Pt was admitted for fever and nausea, treated for UTI, liver biopsy cancelled due to the infection     HISTORY OF PRESENTING ILLNESS:  Michelle Erickson 60 y.o. female is here because of abnoaml CT findings which is highly suspecious for metastatic colon cancer.   She presented intermittent nausea, anorexia and abdominal pain since Nov 2015, she has mild pain in the LUQ, crapy pain, positional (bending over),   it happened once every 2-3 weeks, and it has been more frequent in the past month. She has normal BM, but did notice the caliber of the stool is smaller than before. She denies any melena or hematochezia. She lost  about 13 lbs in the past 6 months.   She presents to local emergency room in November 2015, and May 2016, was felt to be related to gastric virus. Due to the worsening symptoms lately, she went to [facility] ED on 07/13/2014, CT abdomen was obtained which showed multiple large liver metastasis and probable sigmoid colon mass. She was referred to [place] for further workup. She is scheduled to see GI Dr. Olevia Perches this afternoon.  CURRENT THERAPY: mFOLFOX6 and Avastin every 2 weeks, started on 08/09/2014   INTERIM HISTORY Valli returns for follow-up and chemo. She has been tolerating chemotherapy very well, no significant toxicity. She denies any pain, nausea, diarrhea or other symptoms, except mild fatigue which is stable. She has good appetite and eating well. Weight is stable. She functions well at home.  MEDICAL HISTORY:  Past Medical History  Diagnosis Date  . Hyperlipemia   . Hypertension   . Metastatic colon cancer to liver ([place]) 07/21/2014  . Wears glasses     contacts/glasses  . Snoring   . Back pain   . Family history of colon cancer   . Diabetes mellitus without complication ([place])     SURGICAL HISTORY: Past Surgical History  Procedure Laterality Date  . Mouth surgery    . Cataract extraction Bilateral   . Portacath placement N/A 08/03/2014    Procedure: INSERTION PORT-A-CATH;  Surgeon: Stark Klein, MD;  Location: [place];  Service: General;  Laterality: N/A;    SOCIAL HISTORY: Social History   Social History  . Marital Status: Married    Spouse  Name: Charlotte Crumb  . Number of Children: 2  . Years of Education: N/A   Occupational History  . Not on file.   Social History Main Topics  . Smoking status: Former Smoker -- 1.00 packs/day for 6 years    Types: Cigarettes    Quit date: 01/21/1976  . Smokeless tobacco: Not on file  . Alcohol Use: No  . Drug Use: No  . Sexual Activity: Yes    Birth Control/ Protection: Post-menopausal     Comment: # 2 pregnancies and #2 live  births   Other Topics Concern  . Not on file   Social History Narrative   Married, husband [name]   Has #2 adult children   Previously worked at [employer] HISTORY: Family History  Problem Relation Age of Onset  . COPD Father   . Heart attack Father   . Arthritis Sister   . Diabetes Sister   . Colon cancer Sister     dx over 31  . Colon polyps Sister   . Seizures Brother   . Colon cancer Sister     dx in her early 39s, died in her early to mid 3s  . Diabetes Mother     ALLERGIES:  has No Known Allergies.  MEDICATIONS:  Current Outpatient Prescriptions  Medication Sig Dispense Refill  . amLODipine (NORVASC) 5 MG tablet Take 2 tablets (10 mg total) by mouth daily. 30 tablet 0  . atorvastatin (LIPITOR) 40 MG tablet Take 40 mg by mouth daily.    . cetirizine (ZYRTEC) 10 MG tablet Take 10 mg by mouth daily as needed for allergies. Reported on 03/21/2015    . ferrous sulfate 325 (65 FE) MG tablet Take 325 mg by mouth daily with breakfast.    . HUMALOG KWIKPEN 100 UNIT/ML KiwkPen     . Insulin Glargine (LANTUS SOLOSTAR) 100 UNIT/ML Solostar Pen Inject 23 Units into the skin daily at 10 pm.    . insulin lispro (HUMALOG) 100 UNIT/ML injection Inject 4-8 Units into the skin 2 (two) times daily as needed for high blood sugar. Sliding scale    . lidocaine-prilocaine (EMLA) cream     . ondansetron (ZOFRAN-ODT) 8 MG disintegrating tablet Take 8 mg by mouth every 8 (eight) hours as needed for nausea or vomiting. Reported on 03/21/2015     No current facility-administered medications for this visit.   Facility-Administered Medications Ordered in Other Visits  Medication Dose Route Frequency Provider Last Rate Last Dose  . sodium chloride flush (NS) 0.9 % injection 10 mL  10 mL Intravenous PRN Truitt Merle, MD   10 mL at 04/04/15 1012    REVIEW OF SYSTEMS:   Constitutional: Denies fevers, chills or abnormal night sweats Eyes: Denies blurriness of vision, double vision or  watery eyes Ears, nose, mouth, throat, and face: Denies mucositis or sore throat Respiratory: Denies cough, dyspnea or wheezes Cardiovascular: Denies palpitation, chest discomfort or lower extremity swelling Gastrointestinal:  Denies nausea, heartburn or change in bowel habits Skin: Denies abnormal skin rashes Lymphatics: Denies new lymphadenopathy or easy bruising Neurological:Denies numbness, tingling or new weaknesses Behavioral/Psych: Mood is stable, no new changes  All other systems were reviewed with the patient and are negative.  PHYSICAL EXAMINATION: ECOG PERFORMANCE STATUS: 1 - Symptomatic but completely ambulatory BP 126/73 mmHg  Pulse 86  Temp(Src) 97.9 F (36.6 C) (Oral)  Resp 18  Ht _0  (1.626 m)  SpO2 100% GENERAL:alert, no distress and comfortable SKIN:  skin color, texture, turgor are normal, no rashes or significant lesions EYES: normal, conjunctiva are pink and non-injected, sclera clear OROPHARYNX:no exudate, no erythema and lips, buccal mucosa, and tongue normal  NECK: supple, thyroid normal size, non-tender, without nodularity LYMPH:  no palpable lymphadenopathy in the cervical, axillary or inguinal LUNGS: clear to auscultation and percussion with normal breathing effort HEART: regular rate & rhythm and no murmurs and no lower extremity edema ABDOMEN:abdomen soft, non-tender and normal bowel sounds Musculoskeletal:no cyanosis of digits and no clubbing  PSYCH: alert & oriented x 3 with fluent speech NEURO: no focal motor/sensory deficits  LABORATORY DATA:  I have reviewed the data as listed CBC Latest Ref Rng 04/04/2015 03/21/2015 03/07/2015  WBC 3.9 - 10.3 10e3/uL 4.3 4.9 5.1  Hemoglobin 11.6 - 15.9 g/dL 9.8(L) 10.1(L) 10.0(L)  Hematocrit 34.8 - 46.6 % 30.5(L) 30.9(L) 31.2(L)  Platelets 145 - 400 10e3/uL 132(L) 107(L) 126(L)     Recent Labs  07/13/14 2051  08/11/14 2047 08/13/14 0558 08/14/14 0435  03/07/15 0819 03/21/15 0822 04/04/15 0958  NA  --    < > 133* 138 135  < > 138 141 138  K  --   < > 4.3 4.0 4.0  < > 4.7 4.4 4.5  CL  --   < > 98* 109 105  --   --   --   --   CO2  --   < > _0 < > _1 GLUCOSE  --   < > 97 110* 138*  < > 191* 124 175*  BUN  --   < > 22* 18 18  < > 20.2 23.1 28.0*  CREATININE  --   < > 0.98 1.04* 0.94  < > 1.3* 1.1 1.3*  CALCIUM  --   < > 8.8* 8.2* 8.3*  < > 9.4 9.4 9.2  GFRNONAA  --   < > >60 57* >60  --   --   --   --   GFRAA  --   < > >60 >60 >60  --   --   --   --   PROT 7.6  < > 7.6 5.9* 6.5  < > 7.5 7.2 7.2  ALBUMIN 3.8  < > 3.7 2.7* 2.9*  < > 3.7 3.6 3.5  AST 65*  < > 70* 31 30  < > 25 40* 27  ALT 54  < > 51 28 28  < > 32 57* 36  ALKPHOS 146*  < > 178* 134* 146*  < > 110 92 103  BILITOT 0.9  < > 0.7 1.0 0.7  < > 0.36 0.34 <0.30  BILIDIR 0.2  --   --   --  0.1  --   --   --   --   IBILI 0.7  --   --   --  0.6  --   --   --   --   < > = values in this interval not displayed.  CEA (Parallel Testing)  Status: Finalresult Visible to patient:  Not Released Nextappt: 04/06/2015 at 01:45 PM in Oncology ([facility] J32 DNS)           Ref Range 4wk ago  50moago  339mogo     CEA ng/mL 6.6 6.0 (H)R 6.6 (H)R          PATHOLOGY REPORTS: Diagnosis 07/25/2014  Surgical [P], sigmoid - INVASIVE ADENOCARCINOMA ARISING IN A BACKGROUND OF HIGH GRADE  DYSPLASIA. - SEE COMMENT. Microscopic Comment Results are phoned to Dr. Olevia Perches. Internal departmental review obtained (Dr. Donato Heinz) with agreement.  Diagnosis 09/05/2014 Liver, biopsy - METASTATIC ADENOCARCINOMA, SEE COMMENT. Microscopic Comment Given the patient's history of sigmoid invasive adenocarcinoma and the morphology, the findings are consistent with metastatic colorectal adenocarcinoma. The case was called to Dr. Burr Medico. Per request, MMR, MSI, and KRAS testing will be ordered.  KRAS MUTATION (+) c36G>A (p. G12D)  RADIOGRAPHIC STUDIES: I have personally reviewed the radiological images as listed and agreed with the  findings in the report.   CT chest, abdomen and pelvis with contrast 02/04/2014 IMPRESSION: 1. Improving hepatic metastatic disease. 2. Decrease in size of a perisigmoid soft tissue nodule. 3. Coronary artery calcification. 4. Cholelithiasis.    Colonoscopy 07/25/2014 Dr. Olevia Perches  ENDOSCOPIC IMPRESSION: Near circumferential medium mass was found in the sigmoid colon; multiple biopsies were performed using cold forceps, partially obstructing mass consistent with carcinoma. Placement of endoclips and tattoo at the margins of the mass which extends from 18-25 cm from the anus  ASSESSMENT & PLAN: 60 year old African-American female, presented with intermittent nausea vomiting and anemia.   1. Sigmoid colon cancer with liver metastasis, TxNxM1, stage IV, MSI stable, KRAS mutation(+) -I previously reviewed her CT scans, colonoscopy, and colon mass biopsy findings with patient and her husband in great details, images were reviewed in person  -I reviewed her liver biopsy results, which confirmed metastasis from colon cancer. -The natural history of metastatic colon cancer and treatment options were discussed with patient and her husband. Giving the diffuse metastasis in the liver, this is unfortunately incurable disease. I recommend systemic chemotherapy to control her metastatic disease, the goal of therapy is palliative and to prolong her life. -The options of first-line chemotherapy were discussed with her, I recommend FOLFOX and Avastin every 2 weeks intravenously, and I will continue as long as she tolerates and no disease progression -her tumor MSI is stable, KRAS mutated, not a candidate for immunotherapy or EGFR inhibitor. -Her CEA has dropped significantly. I reviewed her restaging CT scan from 02/05/2015 which showed continuous partial response.  -she is tolerating chemo very well, will continue.  Today's lab results were reviewed with her, adequate for treatment. 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 -Her ferritin is normal, serum iron and saturation slightly low, TIBC low normal, I suggest her to take oral iron supplement, she tolerates well, we'll continue once daily, with orange juice or vitamin C   3. DM, HTN -Continue follow-up with PCP - we discussed the potential impact of chemotherapy and premedication dexamethasone on her blood pressure and sugar,will  Monitor closely. Her BG has been well controlled  -Her blood pressure has been much better controlled after we increased her Amlodipine to '10mg'$  daily   4. AKI -Cr has been around 1.1-1.3  will continue monitoring renal function  -I encourage her to drink more fluids  -IV fluids as needed  5. Mild thrombocytopenia -Secondary to chemotherapy. -will monitor closely.  PLan -Lab reviewed, adequate for treatment. Continue FOLFOX plus Avastin today and every 2 weeks, no Neulasta on day 3 -NS 566m today  -restaging CT in 4 weeks -I'll see her back in 4 weeks   All questions were answered. The patient knows to call the clinic with  any problems, questions or concerns.  I spent 15 minutes counseling the patient face to face. The total time spent in the appointment was 20 minutes and more than 50% was on counseling.     Truitt Merle, MD 04/04/2015

## 2015-04-04 NOTE — Telephone Encounter (Signed)
Per staff message and POF I have scheduled appts. Advised scheduler of appts. JMW  

## 2015-04-04 NOTE — Patient Instructions (Signed)

## 2015-04-05 LAB — CEA: CEA: 18.3 ng/mL — ABNORMAL HIGH (ref 0.0–4.7)

## 2015-04-06 ENCOUNTER — Ambulatory Visit (HOSPITAL_BASED_OUTPATIENT_CLINIC_OR_DEPARTMENT_OTHER): Payer: BLUE CROSS/BLUE SHIELD

## 2015-04-06 VITALS — BP 140/85 | HR 89 | Temp 98.0°F | Resp 18

## 2015-04-06 DIAGNOSIS — Z452 Encounter for adjustment and management of vascular access device: Secondary | ICD-10-CM

## 2015-04-06 DIAGNOSIS — C187 Malignant neoplasm of sigmoid colon: Secondary | ICD-10-CM | POA: Diagnosis not present

## 2015-04-06 DIAGNOSIS — C189 Malignant neoplasm of colon, unspecified: Secondary | ICD-10-CM

## 2015-04-06 DIAGNOSIS — C787 Secondary malignant neoplasm of liver and intrahepatic bile duct: Principal | ICD-10-CM

## 2015-04-06 MED ORDER — SODIUM CHLORIDE 0.9 % IJ SOLN
10.0000 mL | INTRAMUSCULAR | Status: DC | PRN
  Administered 2015-04-06: 10 mL
  Filled 2015-04-06: qty 10

## 2015-04-06 MED ORDER — HEPARIN SOD (PORK) LOCK FLUSH 100 UNIT/ML IV SOLN
500.0000 [IU] | Freq: Once | INTRAVENOUS | Status: AC | PRN
Start: 2015-04-06 — End: 2015-04-06
  Administered 2015-04-06: 500 [IU]
  Filled 2015-04-06: qty 5

## 2015-04-18 ENCOUNTER — Other Ambulatory Visit (HOSPITAL_BASED_OUTPATIENT_CLINIC_OR_DEPARTMENT_OTHER): Payer: BLUE CROSS/BLUE SHIELD

## 2015-04-18 ENCOUNTER — Ambulatory Visit (HOSPITAL_BASED_OUTPATIENT_CLINIC_OR_DEPARTMENT_OTHER): Payer: BLUE CROSS/BLUE SHIELD

## 2015-04-18 ENCOUNTER — Other Ambulatory Visit: Payer: BLUE CROSS/BLUE SHIELD

## 2015-04-18 ENCOUNTER — Encounter: Payer: Self-pay | Admitting: *Deleted

## 2015-04-18 VITALS — BP 146/56 | HR 75 | Temp 97.8°F

## 2015-04-18 DIAGNOSIS — Z5111 Encounter for antineoplastic chemotherapy: Secondary | ICD-10-CM

## 2015-04-18 DIAGNOSIS — C187 Malignant neoplasm of sigmoid colon: Secondary | ICD-10-CM

## 2015-04-18 DIAGNOSIS — C787 Secondary malignant neoplasm of liver and intrahepatic bile duct: Secondary | ICD-10-CM

## 2015-04-18 DIAGNOSIS — Z5112 Encounter for antineoplastic immunotherapy: Secondary | ICD-10-CM | POA: Diagnosis not present

## 2015-04-18 DIAGNOSIS — C189 Malignant neoplasm of colon, unspecified: Secondary | ICD-10-CM

## 2015-04-18 DIAGNOSIS — Z95828 Presence of other vascular implants and grafts: Secondary | ICD-10-CM

## 2015-04-18 LAB — CBC WITH DIFFERENTIAL/PLATELET
BASO%: 0.7 % (ref 0.0–2.0)
Basophils Absolute: 0 10*3/uL (ref 0.0–0.1)
EOS%: 4.1 % (ref 0.0–7.0)
Eosinophils Absolute: 0.2 10*3/uL (ref 0.0–0.5)
HEMATOCRIT: 29.1 % — AB (ref 34.8–46.6)
HEMOGLOBIN: 9.4 g/dL — AB (ref 11.6–15.9)
LYMPH#: 0.8 10*3/uL — AB (ref 0.9–3.3)
LYMPH%: 17.5 % (ref 14.0–49.7)
MCH: 31.1 pg (ref 25.1–34.0)
MCHC: 32.5 g/dL (ref 31.5–36.0)
MCV: 95.8 fL (ref 79.5–101.0)
MONO#: 0.7 10*3/uL (ref 0.1–0.9)
MONO%: 16 % — AB (ref 0.0–14.0)
NEUT%: 61.7 % (ref 38.4–76.8)
NEUTROS ABS: 2.6 10*3/uL (ref 1.5–6.5)
Platelets: 103 10*3/uL — ABNORMAL LOW (ref 145–400)
RBC: 3.03 10*6/uL — ABNORMAL LOW (ref 3.70–5.45)
RDW: 16.5 % — ABNORMAL HIGH (ref 11.2–14.5)
WBC: 4.3 10*3/uL (ref 3.9–10.3)

## 2015-04-18 LAB — COMPREHENSIVE METABOLIC PANEL
ALBUMIN: 3.4 g/dL — AB (ref 3.5–5.0)
ALK PHOS: 96 U/L (ref 40–150)
ALT: 40 U/L (ref 0–55)
AST: 40 U/L — AB (ref 5–34)
Anion Gap: 7 mEq/L (ref 3–11)
BILIRUBIN TOTAL: 0.3 mg/dL (ref 0.20–1.20)
BUN: 26.3 mg/dL — AB (ref 7.0–26.0)
CALCIUM: 9.2 mg/dL (ref 8.4–10.4)
CO2: 23 mEq/L (ref 22–29)
CREATININE: 1.2 mg/dL — AB (ref 0.6–1.1)
Chloride: 107 mEq/L (ref 98–109)
EGFR: 57 mL/min/{1.73_m2} — ABNORMAL LOW (ref >90)
GLUCOSE: 226 mg/dL — AB (ref 70–140)
Potassium: 4.8 mEq/L (ref 3.5–5.1)
SODIUM: 138 meq/L (ref 136–145)
TOTAL PROTEIN: 7.2 g/dL (ref 6.4–8.3)

## 2015-04-18 MED ORDER — SODIUM CHLORIDE 0.9% FLUSH
10.0000 mL | INTRAVENOUS | Status: DC | PRN
  Administered 2015-04-18: 10 mL via INTRAVENOUS
  Filled 2015-04-18: qty 10

## 2015-04-18 MED ORDER — DEXTROSE 5 % IV SOLN
Freq: Once | INTRAVENOUS | Status: AC
  Administered 2015-04-18: 14:00:00 via INTRAVENOUS

## 2015-04-18 MED ORDER — SODIUM CHLORIDE 0.9 % IV SOLN
2200.0000 mg/m2 | INTRAVENOUS | Status: DC
  Administered 2015-04-18: 3950 mg via INTRAVENOUS
  Filled 2015-04-18: qty 79

## 2015-04-18 MED ORDER — DEXTROSE 5 % IV SOLN
400.0000 mg/m2 | Freq: Once | INTRAVENOUS | Status: AC
  Administered 2015-04-18: 716 mg via INTRAVENOUS
  Filled 2015-04-18: qty 35.8

## 2015-04-18 MED ORDER — SODIUM CHLORIDE 0.9 % IV SOLN
5.0000 mg/kg | Freq: Once | INTRAVENOUS | Status: AC
  Administered 2015-04-18: 350 mg via INTRAVENOUS
  Filled 2015-04-18: qty 14

## 2015-04-18 MED ORDER — SODIUM CHLORIDE 0.9 % IV SOLN
Freq: Once | INTRAVENOUS | Status: AC
  Administered 2015-04-18: 13:00:00 via INTRAVENOUS

## 2015-04-18 MED ORDER — OXALIPLATIN CHEMO INJECTION 100 MG/20ML
70.0000 mg/m2 | Freq: Once | INTRAVENOUS | Status: AC
  Administered 2015-04-18: 125 mg via INTRAVENOUS
  Filled 2015-04-18: qty 20

## 2015-04-18 MED ORDER — SODIUM CHLORIDE 0.9 % IV SOLN
Freq: Once | INTRAVENOUS | Status: AC
  Administered 2015-04-18: 13:00:00 via INTRAVENOUS
  Filled 2015-04-18: qty 4

## 2015-04-18 NOTE — Patient Instructions (Signed)

## 2015-04-18 NOTE — Progress Notes (Signed)
Oncology Nurse Navigator Documentation  Oncology Nurse Navigator Flowsheets 04/18/2015  Navigator Location [facility]  Navigator Encounter Type Treatment/ 8 mo f/u  Patient Visit Type MedOnc  Treatment Phase Active Tx--Avastin/FOLFOX cycle #17  Barriers/Navigation Needs No Questions;No Needs;No barriers at this time  Interventions Other--provided contact information for any issues she may have  Support Groups/Services GI Support Group  Acuity Level 1  Time Spent with Patient 15  Time Spent with Patient (Retired) -  Reports feeling well, getting out. Husband and her will celebrate 46th anniversary soon. Tolerating tx well and looks great.

## 2015-04-18 NOTE — Patient Instructions (Signed)
[place] Discharge Instructions for Patients Receiving Chemotherapy  Today you received the following chemotherapy agents:  Avastin, Oxaliplatin, 5FU, and Leucovorin.  To help prevent nausea and vomiting after your treatment, we encourage you to take your nausea medication as prescribed.   If you develop nausea and vomiting that is not controlled by your nausea medication, call the clinic.   BELOW ARE SYMPTOMS THAT SHOULD BE REPORTED IMMEDIATELY:  *FEVER GREATER THAN 100.5 F  *CHILLS WITH OR WITHOUT FEVER  NAUSEA AND VOMITING THAT IS NOT CONTROLLED WITH YOUR NAUSEA MEDICATION  *UNUSUAL SHORTNESS OF BREATH  *UNUSUAL BRUISING OR BLEEDING  TENDERNESS IN MOUTH AND THROAT WITH OR WITHOUT PRESENCE OF ULCERS  *URINARY PROBLEMS  *BOWEL PROBLEMS  UNUSUAL RASH Items with * indicate a potential emergency and should be followed up as soon as possible.  Feel free to call the clinic you have any questions or concerns. The clinic phone number is [phone].  Please show the [place] at check-in to the Emergency Department and triage nurse.

## 2015-04-20 ENCOUNTER — Other Ambulatory Visit: Payer: BLUE CROSS/BLUE SHIELD

## 2015-04-20 ENCOUNTER — Ambulatory Visit (HOSPITAL_BASED_OUTPATIENT_CLINIC_OR_DEPARTMENT_OTHER): Payer: BLUE CROSS/BLUE SHIELD

## 2015-04-20 VITALS — BP 133/74 | HR 88 | Temp 98.8°F

## 2015-04-20 DIAGNOSIS — C787 Secondary malignant neoplasm of liver and intrahepatic bile duct: Principal | ICD-10-CM

## 2015-04-20 DIAGNOSIS — C189 Malignant neoplasm of colon, unspecified: Secondary | ICD-10-CM

## 2015-04-20 DIAGNOSIS — Z452 Encounter for adjustment and management of vascular access device: Secondary | ICD-10-CM | POA: Diagnosis not present

## 2015-04-20 DIAGNOSIS — C187 Malignant neoplasm of sigmoid colon: Secondary | ICD-10-CM

## 2015-04-20 MED ORDER — HEPARIN SOD (PORK) LOCK FLUSH 100 UNIT/ML IV SOLN
500.0000 [IU] | Freq: Once | INTRAVENOUS | Status: AC | PRN
  Administered 2015-04-20: 500 [IU]
  Filled 2015-04-20: qty 5

## 2015-04-20 MED ORDER — SODIUM CHLORIDE 0.9 % IJ SOLN
10.0000 mL | INTRAMUSCULAR | Status: DC | PRN
  Administered 2015-04-20: 10 mL
  Filled 2015-04-20: qty 10

## 2015-04-20 NOTE — Patient Instructions (Signed)

## 2015-04-24 ENCOUNTER — Telehealth: Payer: Self-pay | Admitting: Hematology

## 2015-04-24 ENCOUNTER — Other Ambulatory Visit: Payer: Self-pay | Admitting: Hematology

## 2015-04-24 DIAGNOSIS — C189 Malignant neoplasm of colon, unspecified: Secondary | ICD-10-CM

## 2015-04-24 DIAGNOSIS — C787 Secondary malignant neoplasm of liver and intrahepatic bile duct: Principal | ICD-10-CM

## 2015-04-24 NOTE — Telephone Encounter (Signed)
Spoke with patient she ia aware of PET scan being cxl for 4/7. Informed her the central radiology will contact her to schedule CT CAP

## 2015-04-27 ENCOUNTER — Ambulatory Visit (HOSPITAL_COMMUNITY): Payer: BLUE CROSS/BLUE SHIELD

## 2015-04-30 ENCOUNTER — Encounter (HOSPITAL_COMMUNITY): Payer: Self-pay

## 2015-04-30 ENCOUNTER — Ambulatory Visit (HOSPITAL_COMMUNITY)
Admission: RE | Admit: 2015-04-30 | Discharge: 2015-04-30 | Disposition: A | Discharge status: Home / Self Care | Payer: BLUE CROSS/BLUE SHIELD | Source: Ambulatory Visit | Attending: Hematology | Admitting: Hematology

## 2015-04-30 DIAGNOSIS — K8020 Calculus of gallbladder without cholecystitis without obstruction: Secondary | ICD-10-CM | POA: Diagnosis not present

## 2015-04-30 DIAGNOSIS — K449 Diaphragmatic hernia without obstruction or gangrene: Secondary | ICD-10-CM | POA: Diagnosis not present

## 2015-04-30 DIAGNOSIS — C787 Secondary malignant neoplasm of liver and intrahepatic bile duct: Secondary | ICD-10-CM | POA: Insufficient documentation

## 2015-04-30 DIAGNOSIS — C189 Malignant neoplasm of colon, unspecified: Secondary | ICD-10-CM | POA: Insufficient documentation

## 2015-04-30 DIAGNOSIS — I700 Atherosclerosis of aorta: Secondary | ICD-10-CM | POA: Diagnosis not present

## 2015-04-30 DIAGNOSIS — R590 Localized enlarged lymph nodes: Secondary | ICD-10-CM | POA: Insufficient documentation

## 2015-04-30 MED ORDER — DIATRIZOATE MEGLUMINE & SODIUM 66-10 % PO SOLN
30.0000 mL | Freq: Once | ORAL | Status: AC
  Administered 2015-04-30: 30 mL via ORAL
  Filled 2015-04-30: qty 30

## 2015-04-30 MED ORDER — IOPAMIDOL (ISOVUE-300) INJECTION 61%
100.0000 mL | Freq: Once | INTRAVENOUS | Status: AC | PRN
  Administered 2015-04-30: 80 mL via INTRAVENOUS

## 2015-05-02 ENCOUNTER — Telehealth: Payer: Self-pay | Admitting: Hematology

## 2015-05-02 ENCOUNTER — Ambulatory Visit: Payer: BLUE CROSS/BLUE SHIELD

## 2015-05-02 ENCOUNTER — Ambulatory Visit (HOSPITAL_BASED_OUTPATIENT_CLINIC_OR_DEPARTMENT_OTHER): Payer: BLUE CROSS/BLUE SHIELD

## 2015-05-02 ENCOUNTER — Telehealth: Payer: Self-pay | Admitting: *Deleted

## 2015-05-02 ENCOUNTER — Ambulatory Visit (HOSPITAL_BASED_OUTPATIENT_CLINIC_OR_DEPARTMENT_OTHER): Payer: BLUE CROSS/BLUE SHIELD | Admitting: Hematology

## 2015-05-02 ENCOUNTER — Encounter: Payer: Self-pay | Admitting: Hematology

## 2015-05-02 ENCOUNTER — Other Ambulatory Visit (HOSPITAL_BASED_OUTPATIENT_CLINIC_OR_DEPARTMENT_OTHER): Payer: BLUE CROSS/BLUE SHIELD

## 2015-05-02 ENCOUNTER — Other Ambulatory Visit: Payer: Self-pay | Admitting: *Deleted

## 2015-05-02 VITALS — BP 152/66 | HR 91 | Temp 98.2°F | Resp 18 | Ht 64.0 in | Wt 158.1 lb

## 2015-05-02 VITALS — BP 152/68 | HR 84 | Resp 16

## 2015-05-02 DIAGNOSIS — I10 Essential (primary) hypertension: Secondary | ICD-10-CM

## 2015-05-02 DIAGNOSIS — Z95828 Presence of other vascular implants and grafts: Secondary | ICD-10-CM

## 2015-05-02 DIAGNOSIS — C787 Secondary malignant neoplasm of liver and intrahepatic bile duct: Secondary | ICD-10-CM | POA: Diagnosis not present

## 2015-05-02 DIAGNOSIS — D6959 Other secondary thrombocytopenia: Secondary | ICD-10-CM | POA: Diagnosis not present

## 2015-05-02 DIAGNOSIS — C189 Malignant neoplasm of colon, unspecified: Secondary | ICD-10-CM

## 2015-05-02 DIAGNOSIS — C187 Malignant neoplasm of sigmoid colon: Secondary | ICD-10-CM

## 2015-05-02 DIAGNOSIS — Z5112 Encounter for antineoplastic immunotherapy: Secondary | ICD-10-CM | POA: Diagnosis not present

## 2015-05-02 DIAGNOSIS — E119 Type 2 diabetes mellitus without complications: Secondary | ICD-10-CM | POA: Diagnosis not present

## 2015-05-02 DIAGNOSIS — D630 Anemia in neoplastic disease: Secondary | ICD-10-CM

## 2015-05-02 DIAGNOSIS — Z5111 Encounter for antineoplastic chemotherapy: Secondary | ICD-10-CM

## 2015-05-02 DIAGNOSIS — T451X5A Adverse effect of antineoplastic and immunosuppressive drugs, initial encounter: Secondary | ICD-10-CM

## 2015-05-02 DIAGNOSIS — N179 Acute kidney failure, unspecified: Secondary | ICD-10-CM

## 2015-05-02 LAB — CBC WITH DIFFERENTIAL/PLATELET
BASO%: 0.2 % (ref 0.0–2.0)
Basophils Absolute: 0 10*3/uL (ref 0.0–0.1)
EOS%: 3.6 % (ref 0.0–7.0)
Eosinophils Absolute: 0.2 10*3/uL (ref 0.0–0.5)
HEMATOCRIT: 28.2 % — AB (ref 34.8–46.6)
HGB: 9.1 g/dL — ABNORMAL LOW (ref 11.6–15.9)
LYMPH#: 0.9 10*3/uL (ref 0.9–3.3)
LYMPH%: 21 % (ref 14.0–49.7)
MCH: 30.5 pg (ref 25.1–34.0)
MCHC: 32.3 g/dL (ref 31.5–36.0)
MCV: 94.6 fL (ref 79.5–101.0)
MONO#: 0.7 10*3/uL (ref 0.1–0.9)
MONO%: 15.2 % — ABNORMAL HIGH (ref 0.0–14.0)
NEUT%: 60 % (ref 38.4–76.8)
NEUTROS ABS: 2.7 10*3/uL (ref 1.5–6.5)
PLATELETS: 101 10*3/uL — AB (ref 145–400)
RBC: 2.98 10*6/uL — AB (ref 3.70–5.45)
RDW: 16.2 % — ABNORMAL HIGH (ref 11.2–14.5)
WBC: 4.4 10*3/uL (ref 3.9–10.3)

## 2015-05-02 LAB — COMPREHENSIVE METABOLIC PANEL
ALT: 32 U/L (ref 0–55)
ANION GAP: 7 meq/L (ref 3–11)
AST: 33 U/L (ref 5–34)
Albumin: 3.4 g/dL — ABNORMAL LOW (ref 3.5–5.0)
Alkaline Phosphatase: 90 U/L (ref 40–150)
BUN: 19.8 mg/dL (ref 7.0–26.0)
CO2: 25 meq/L (ref 22–29)
CREATININE: 1.2 mg/dL — AB (ref 0.6–1.1)
Calcium: 9.3 mg/dL (ref 8.4–10.4)
Chloride: 107 mEq/L (ref 98–109)
EGFR: 55 mL/min/{1.73_m2} — ABNORMAL LOW (ref >90)
Glucose: 200 mg/dl — ABNORMAL HIGH (ref 70–140)
Potassium: 4.8 mEq/L (ref 3.5–5.1)
Sodium: 139 mEq/L (ref 136–145)
TOTAL PROTEIN: 7 g/dL (ref 6.4–8.3)

## 2015-05-02 LAB — UA PROTEIN, DIPSTICK - CHCC: Protein, ur: 100 mg/dL

## 2015-05-02 MED ORDER — DEXTROSE 5 % IV SOLN
Freq: Once | INTRAVENOUS | Status: AC
  Administered 2015-05-02: 15:00:00 via INTRAVENOUS

## 2015-05-02 MED ORDER — SODIUM CHLORIDE 0.9 % IV SOLN
Freq: Once | INTRAVENOUS | Status: AC
  Administered 2015-05-02: 13:00:00 via INTRAVENOUS

## 2015-05-02 MED ORDER — LEUCOVORIN CALCIUM INJECTION 350 MG
400.0000 mg/m2 | Freq: Once | INTRAMUSCULAR | Status: AC
  Administered 2015-05-02: 716 mg via INTRAVENOUS
  Filled 2015-05-02: qty 35.8

## 2015-05-02 MED ORDER — SODIUM CHLORIDE 0.9% FLUSH
10.0000 mL | INTRAVENOUS | Status: DC | PRN
  Filled 2015-05-02: qty 10

## 2015-05-02 MED ORDER — SODIUM CHLORIDE 0.9 % IV SOLN
INTRAVENOUS | Status: AC
  Administered 2015-05-02: 14:00:00 via INTRAVENOUS

## 2015-05-02 MED ORDER — SODIUM CHLORIDE 0.9 % IV SOLN
Freq: Once | INTRAVENOUS | Status: AC
  Administered 2015-05-02: 14:00:00 via INTRAVENOUS
  Filled 2015-05-02: qty 4

## 2015-05-02 MED ORDER — BEVACIZUMAB CHEMO INJECTION 400 MG/16ML
5.0000 mg/kg | Freq: Once | INTRAVENOUS | Status: AC
  Administered 2015-05-02: 350 mg via INTRAVENOUS
  Filled 2015-05-02: qty 14

## 2015-05-02 MED ORDER — HEPARIN SOD (PORK) LOCK FLUSH 100 UNIT/ML IV SOLN
500.0000 [IU] | Freq: Once | INTRAVENOUS | Status: AC | PRN
  Filled 2015-05-02: qty 5

## 2015-05-02 MED ORDER — SODIUM CHLORIDE 0.9 % IJ SOLN
10.0000 mL | INTRAMUSCULAR | Status: DC | PRN
Start: 2015-05-02 — End: 2015-05-03
  Filled 2015-05-02: qty 10

## 2015-05-02 MED ORDER — SODIUM CHLORIDE 0.9 % IV SOLN
2200.0000 mg/m2 | INTRAVENOUS | Status: DC
  Administered 2015-05-02: 3950 mg via INTRAVENOUS
  Filled 2015-05-02: qty 79

## 2015-05-02 MED ORDER — OXALIPLATIN CHEMO INJECTION 100 MG/20ML
70.0000 mg/m2 | Freq: Once | INTRAVENOUS | Status: AC
  Administered 2015-05-02: 125 mg via INTRAVENOUS
  Filled 2015-05-02: qty 20

## 2015-05-02 NOTE — Progress Notes (Signed)
Ok to treat per Dr. Burr Medico with platelets 101.

## 2015-05-02 NOTE — Telephone Encounter (Signed)
per pof to sch pt appt-sent MW email to sch trmt-pt tog et updated copy of avs b4 leaving °

## 2015-05-02 NOTE — Progress Notes (Signed)
[place]  [phone] [phone]  Clinic Follow Up Note   Patient Care Team: Dione Housekeeper, MD as PCP - General (Family Medicine) Tania Ade, RN as Registered Nurse (Oncology) 05/02/2015  CHIEF COMPLAINTS:  Follow-up of metastatic colon cancer  Oncology History   Metastatic colon cancer to liver   Staging form: Colon and Rectum, AJCC 7th Edition     Clinical: Stage Unknown ([place], NX, M1) - Unsigned        Metastatic colon cancer to liver ([place])   07/14/2014 Imaging CT abdomen and pelvis with contrast showed a 6 cm segment of sigmoid bowel wall thickening with adjacent 14 x 12 mm nodules, diffuse hepatic metastasis. No bowel obstruction.   07/14/2014 Initial Diagnosis Metastatic colon cancer to liver   07/25/2014 Procedure Colonoscopy showed a near circumferential medial mass in the sigmoid colon, partially obstructing consistent with carcinoma.   07/25/2014 Initial Biopsy Sigmoid: Biopsy showed invasive adenocarcinoma arising in a background of high-grade dysplasia.   07/28/2014 Imaging CT chest showed no evidence of metastasis   08/09/2014 -  Chemotherapy mFOLFOX6 every 2 weeks, dose reduction from cycle 7 due to cytopenia, Avastin added from cycle 3   08/11/2014 - 08/15/2014 Hospital Admission Pt was admitted for fever and nausea, treated for UTI, liver biopsy cancelled due to the infection     HISTORY OF PRESENTING ILLNESS:  Michelle Erickson 60 y.o. female is here because of abnoaml CT findings which is highly suspecious for metastatic colon cancer.   She presented intermittent nausea, anorexia and abdominal pain since Nov 2015, she has mild pain in the LUQ, crapy pain, positional (bending over),   it happened once every 2-3 weeks, and it has been more frequent in the past month. She has normal BM, but did notice the caliber of the stool is smaller than before. She denies any melena or hematochezia. She lost  about 13 lbs in the past 6 months.   She presents to local emergency room in November 2015, and May 2016, was felt to be related to gastric virus. Due to the worsening symptoms lately, she went to [facility] ED on 07/13/2014, CT abdomen was obtained which showed multiple large liver metastasis and probable sigmoid colon mass. She was referred to [place] for further workup. She is scheduled to see GI Dr. Olevia Perches this afternoon.  CURRENT THERAPY: mFOLFOX6 and Avastin every 2 weeks, started on 08/09/2014   INTERIM HISTORY [name] returns for follow-up and chemo. She is doing very well. She denies any significant pain, nausea, abdominal discomfort or other symptoms. She has been tolerating chemotherapy well, no significant side effects. She has mild fatigue but able to function very well at home. Her weight has been stable.  MEDICAL HISTORY:  Past Medical History  Diagnosis Date  . Hyperlipemia   . Hypertension   . Wears glasses     contacts/glasses  . Snoring   . Back pain   . Family history of colon cancer   . Diabetes mellitus without complication ([place])   . Metastatic colon cancer to liver ([place]) 07/21/2014    SURGICAL HISTORY: Past Surgical History  Procedure Laterality Date  . Mouth surgery    . Cataract extraction Bilateral   . Portacath placement N/A 08/03/2014    Procedure: INSERTION PORT-A-CATH;  Surgeon: Stark Klein, MD;  Location: [place];  Service: General;  Laterality: N/A;    SOCIAL HISTORY: Social History   Social History  . Marital Status: Married  Spouse Name: Michelle Erickson  . Number of Children: 2  . Years of Education: N/A   Occupational History  . Not on file.   Social History Main Topics  . Smoking status: Former Smoker -- 1.00 packs/day for 6 years    Types: Cigarettes    Quit date: 01/21/1976  . Smokeless tobacco: Not on file  . Alcohol Use: No  . Drug Use: No  . Sexual Activity: Yes    Birth Control/ Protection: Post-menopausal     Comment: # 2  pregnancies and #2 live births   Other Topics Concern  . Not on file   Social History Narrative   Married, husband [name]   Has #2 adult children   Previously worked at [employer] HISTORY: Family History  Problem Relation Age of Onset  . COPD Father   . Heart attack Father   . Arthritis Sister   . Diabetes Sister   . Colon cancer Sister     dx over 35  . Colon polyps Sister   . Seizures Brother   . Colon cancer Sister     dx in her early 61s, died in her early to mid 75s  . Diabetes Mother     ALLERGIES:  has No Known Allergies.  MEDICATIONS:  Current Outpatient Prescriptions  Medication Sig Dispense Refill  . amLODipine (NORVASC) 5 MG tablet Take 2 tablets (10 mg total) by mouth daily. 30 tablet 0  . atorvastatin (LIPITOR) 40 MG tablet Take 40 mg by mouth daily.    . cetirizine (ZYRTEC) 10 MG tablet Take 10 mg by mouth daily as needed for allergies. Reported on 03/21/2015    . ferrous sulfate 325 (65 FE) MG tablet Take 325 mg by mouth daily with breakfast.    . HUMALOG KWIKPEN 100 UNIT/ML KiwkPen     . Insulin Glargine (LANTUS SOLOSTAR) 100 UNIT/ML Solostar Pen Inject 23 Units into the skin daily at 10 pm.    . insulin lispro (HUMALOG) 100 UNIT/ML injection Inject 4-8 Units into the skin 2 (two) times daily as needed for high blood sugar. Sliding scale    . lidocaine-prilocaine (EMLA) cream     . ondansetron (ZOFRAN-ODT) 8 MG disintegrating tablet Take 8 mg by mouth every 8 (eight) hours as needed for nausea or vomiting. Reported on 03/21/2015     No current facility-administered medications for this visit.    REVIEW OF SYSTEMS:   Constitutional: Denies fevers, chills or abnormal night sweats Eyes: Denies blurriness of vision, double vision or watery eyes Ears, nose, mouth, throat, and face: Denies mucositis or sore throat Respiratory: Denies cough, dyspnea or wheezes Cardiovascular: Denies palpitation, chest discomfort or lower extremity  swelling Gastrointestinal:  Denies nausea, heartburn or change in bowel habits Skin: Denies abnormal skin rashes Lymphatics: Denies new lymphadenopathy or easy bruising Neurological:Denies numbness, tingling or new weaknesses Behavioral/Psych: Mood is stable, no new changes  All other systems were reviewed with the patient and are negative.  PHYSICAL EXAMINATION: ECOG PERFORMANCE STATUS: 0  BP 152/66 mmHg  Pulse 91  Temp(Src) 98.2 F (36.8 C) (Oral)  Resp 18  Ht _0  (1.626 m)  Wt 158 lb 1.6 oz (71.714 kg)  BMI 27.12 kg/m2  SpO2 100% GENERAL:alert, no distress and comfortable SKIN: skin color, texture, turgor are normal, no rashes or significant lesions EYES: normal, conjunctiva are pink and non-injected, sclera clear OROPHARYNX:no exudate, no erythema and lips, buccal mucosa, and tongue normal  NECK: supple, thyroid  normal size, non-tender, without nodularity LYMPH:  no palpable lymphadenopathy in the cervical, axillary or inguinal LUNGS: clear to auscultation and percussion with normal breathing effort HEART: regular rate & rhythm and no murmurs and no lower extremity edema ABDOMEN:abdomen soft, non-tender and normal bowel sounds Musculoskeletal:no cyanosis of digits and no clubbing  PSYCH: alert & oriented x 3 with fluent speech NEURO: no focal motor/sensory deficits  LABORATORY DATA:  I have reviewed the data as listed CBC Latest Ref Rng 04/18/2015 04/04/2015 03/21/2015  WBC 3.9 - 10.3 10e3/uL 4.3 4.3 4.9  Hemoglobin 11.6 - 15.9 g/dL 9.4(L) 9.8(L) 10.1(L)  Hematocrit 34.8 - 46.6 % 29.1(L) 30.5(L) 30.9(L)  Platelets 145 - 400 10e3/uL 103(L) 132(L) 107(L)   CMP Latest Ref Rng 05/02/2015 04/18/2015 04/04/2015  Glucose 70 - 140 mg/dl 200(H) 226(H) 175(H)  BUN 7.0 - 26.0 mg/dL 19.8 26.3(H) 28.0(H)  Creatinine 0.6 - 1.1 mg/dL 1.2(H) 1.2(H) 1.3(H)  Sodium 136 - 145 mEq/L 139 138 138  Potassium 3.5 - 5.1 mEq/L 4.8 4.8 4.5  CO2 22 - 29 mEq/L _0 Calcium 8.4 - 10.4 mg/dL 9.3  9.2 9.2  Total Protein 6.4 - 8.3 g/dL 7.0 7.2 7.2  Total Bilirubin 0.20 - 1.20 mg/dL <0.30 0.30 <0.30  Alkaline Phos 40 - 150 U/L 90 96 103  AST 5 - 34 U/L 33 40(H) 27  ALT 0 - 55 U/L 32 40 36      CEA (Parallel Testing)  Status: Finalresult Visible to patient:  Not Released Nextappt: 04/06/2015 at 01:45 PM in Oncology ([facility] J32 DNS)           Ref Range 4wk ago  48moago  349mogo     CEA ng/mL 6.6 6.0 (H)R 6.6 (H)R          PATHOLOGY REPORTS: Diagnosis 07/25/2014  Surgical [P], sigmoid - INVASIVE ADENOCARCINOMA ARISING IN A BACKGROUND OF HIGH GRADE DYSPLASIA. - SEE COMMENT. Microscopic Comment Results are phoned to Dr. BrOlevia PerchesInternal departmental review obtained (Dr. RuDonato Heinzwith agreement.  Diagnosis 09/05/2014 Liver, biopsy - METASTATIC ADENOCARCINOMA, SEE COMMENT. Microscopic Comment Given the patient's history of sigmoid invasive adenocarcinoma and the morphology, the findings are consistent with metastatic colorectal adenocarcinoma. The case was called to Dr. FeBurr MedicoPer request, MMR, MSI, and KRAS testing will be ordered.  KRAS MUTATION (+) c36G>A (p. G12D)  RADIOGRAPHIC STUDIES: I have personally reviewed the radiological images as listed and agreed with the findings in the report.   CT chest, abdomen and pelvis with contrast 04/30/2015 IMPRESSION: 1. Hepatic metastases continued to decrease in size. 2. Continued decrease in size of the 6 mm nodule adjacent to the sigmoid colon. 3. Cholelithiasis. 4. Small hiatal hernia. 5. Abdominal aortic atherosclerosis.    Colonoscopy 07/25/2014 Dr. BrOlevia PerchesENDOSCOPIC IMPRESSION: Near circumferential medium mass was found in the sigmoid colon; multiple biopsies were performed using cold forceps, partially obstructing mass consistent with carcinoma. Placement of endoclips and tattoo at the margins of the mass which extends from 18-25 cm from the anus  ASSESSMENT & PLAN: [age]ear old  African-American female, presented with intermittent nausea vomiting and anemia.   1. Sigmoid colon cancer with liver metastasis, TxNxM1, stage IV, MSI stable, KRAS mutation(+) -I previously reviewed her CT scans, colonoscopy, and colon mass biopsy findings with patient and her husband in great details, images were reviewed in person  -I reviewed her liver biopsy results, which confirmed metastasis from colon cancer. -The natural history of metastatic colon cancer and treatment options were discussed  with patient and her husband. Giving the diffuse metastasis in the liver, this is unfortunately incurable disease. I recommend systemic chemotherapy to control her metastatic disease, the goal of therapy is palliative and to prolong her life. -The options of first-line chemotherapy were discussed with her, I recommend FOLFOX and Avastin every 2 weeks intravenously, and I will continue as long as she tolerates and no disease progression -her tumor MSI is stable, KRAS mutated, not a candidate for immunotherapy or EGFR inhibitor. -Her CEA has dropped significantly. I reviewed her restaging CT scan from 04/30/2015 which showed continuous partial response. Due to her initial diffuse liver metastases, I do not think she is a candidate for liver targeted therapy.  -she is tolerating chemo very well, will continue.  Today's lab results were reviewed with her, adequate for treatment. 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 -Her ferritin is normal, serum iron and saturation slightly low, TIBC low normal, I suggest her to take oral iron supplement, she tolerates well, we'll continue once daily, with orange juice or vitamin C   3. DM, HTN -Continue follow-up with PCP - we discussed the potential impact of  chemotherapy and premedication dexamethasone on her blood pressure and sugar,will  Monitor closely. Her BG has been well controlled  -Her blood pressure has been much better controlled after we increased her Amlodipine to 58m daily   4. AKI -Cr has been around 1.1-1.3  will continue monitoring renal function  -I encourage her to drink more fluids  -IV fluids as needed  5. Mild thrombocytopenia -Secondary to chemotherapy. -will monitor closely.  PLan -Lab reviewed, adequate for treatment. Continue FOLFOX plus Avastin today and every 2 weeks, no Neulasta on day 3 -I'll see her back in 4 weeks   All questions were answered. The patient knows to call the clinic with any problems, questions or concerns.  I spent 25 minutes counseling the patient face to face. The total time spent in the appointment was 30 minutes and more than 50% was on counseling.     FTruitt Merle MD 05/02/2015

## 2015-05-02 NOTE — Patient Instructions (Signed)
[place] Discharge Instructions for Patients Receiving Chemotherapy  Today you received the following chemotherapy agents:  Avastin, Oxaliplatin, 5FU, and Leucovorin.  To help prevent nausea and vomiting after your treatment, we encourage you to take your nausea medication as prescribed.   If you develop nausea and vomiting that is not controlled by your nausea medication, call the clinic.   BELOW ARE SYMPTOMS THAT SHOULD BE REPORTED IMMEDIATELY:  *FEVER GREATER THAN 100.5 F  *CHILLS WITH OR WITHOUT FEVER  NAUSEA AND VOMITING THAT IS NOT CONTROLLED WITH YOUR NAUSEA MEDICATION  *UNUSUAL SHORTNESS OF BREATH  *UNUSUAL BRUISING OR BLEEDING  TENDERNESS IN MOUTH AND THROAT WITH OR WITHOUT PRESENCE OF ULCERS  *URINARY PROBLEMS  *BOWEL PROBLEMS  UNUSUAL RASH Items with * indicate a potential emergency and should be followed up as soon as possible.  Feel free to call the clinic you have any questions or concerns. The clinic phone number is [phone].  Please show the [place] at check-in to the Emergency Department and triage nurse.

## 2015-05-02 NOTE — Telephone Encounter (Signed)
Per staff message and POF I have scheduled appts. Advised scheduler of appts. JMW  

## 2015-05-02 NOTE — Progress Notes (Signed)
Ok to treat with 2+ urine protein per Dr. Burr Medico; pt encouraged to increase fluid intake over the next few days.  Pt verbalized understanding of need to increase fluid intake. Pt also given extra 500ns fluids during treatment today.

## 2015-05-02 NOTE — Patient Instructions (Signed)

## 2015-05-03 LAB — CEA: CEA1: 20.8 ng/mL — AB (ref 0.0–4.7)

## 2015-05-04 ENCOUNTER — Ambulatory Visit (HOSPITAL_BASED_OUTPATIENT_CLINIC_OR_DEPARTMENT_OTHER): Payer: BLUE CROSS/BLUE SHIELD

## 2015-05-04 DIAGNOSIS — C187 Malignant neoplasm of sigmoid colon: Secondary | ICD-10-CM | POA: Diagnosis not present

## 2015-05-04 DIAGNOSIS — C787 Secondary malignant neoplasm of liver and intrahepatic bile duct: Principal | ICD-10-CM

## 2015-05-04 DIAGNOSIS — C189 Malignant neoplasm of colon, unspecified: Secondary | ICD-10-CM

## 2015-05-04 DIAGNOSIS — Z452 Encounter for adjustment and management of vascular access device: Secondary | ICD-10-CM | POA: Diagnosis not present

## 2015-05-04 MED ORDER — HEPARIN SOD (PORK) LOCK FLUSH 100 UNIT/ML IV SOLN
500.0000 [IU] | Freq: Once | INTRAVENOUS | Status: AC | PRN
  Administered 2015-05-04: 500 [IU]
  Filled 2015-05-04: qty 5

## 2015-05-04 MED ORDER — SODIUM CHLORIDE 0.9 % IJ SOLN
10.0000 mL | INTRAMUSCULAR | Status: DC | PRN
  Administered 2015-05-04: 10 mL
  Filled 2015-05-04: qty 10

## 2015-05-15 ENCOUNTER — Other Ambulatory Visit: Payer: Self-pay | Admitting: *Deleted

## 2015-05-16 ENCOUNTER — Ambulatory Visit: Payer: BLUE CROSS/BLUE SHIELD

## 2015-05-16 ENCOUNTER — Other Ambulatory Visit (HOSPITAL_BASED_OUTPATIENT_CLINIC_OR_DEPARTMENT_OTHER): Payer: BLUE CROSS/BLUE SHIELD

## 2015-05-16 ENCOUNTER — Ambulatory Visit (HOSPITAL_BASED_OUTPATIENT_CLINIC_OR_DEPARTMENT_OTHER): Payer: BLUE CROSS/BLUE SHIELD

## 2015-05-16 ENCOUNTER — Telehealth: Payer: Self-pay | Admitting: Hematology

## 2015-05-16 ENCOUNTER — Other Ambulatory Visit: Payer: Self-pay | Admitting: *Deleted

## 2015-05-16 VITALS — BP 157/69 | Temp 98.3°F | Resp 82

## 2015-05-16 DIAGNOSIS — Z95828 Presence of other vascular implants and grafts: Secondary | ICD-10-CM | POA: Insufficient documentation

## 2015-05-16 DIAGNOSIS — C187 Malignant neoplasm of sigmoid colon: Secondary | ICD-10-CM

## 2015-05-16 DIAGNOSIS — Z5112 Encounter for antineoplastic immunotherapy: Secondary | ICD-10-CM

## 2015-05-16 DIAGNOSIS — C189 Malignant neoplasm of colon, unspecified: Secondary | ICD-10-CM

## 2015-05-16 DIAGNOSIS — C787 Secondary malignant neoplasm of liver and intrahepatic bile duct: Secondary | ICD-10-CM

## 2015-05-16 DIAGNOSIS — Z5111 Encounter for antineoplastic chemotherapy: Secondary | ICD-10-CM

## 2015-05-16 LAB — CBC WITH DIFFERENTIAL/PLATELET
BASO%: 0.2 % (ref 0.0–2.0)
BASOS ABS: 0 10*3/uL (ref 0.0–0.1)
EOS ABS: 0.2 10*3/uL (ref 0.0–0.5)
EOS%: 4.8 % (ref 0.0–7.0)
HEMATOCRIT: 28.4 % — AB (ref 34.8–46.6)
HEMOGLOBIN: 9 g/dL — AB (ref 11.6–15.9)
LYMPH#: 1.1 10*3/uL (ref 0.9–3.3)
LYMPH%: 22.9 % (ref 14.0–49.7)
MCH: 30.2 pg (ref 25.1–34.0)
MCHC: 31.7 g/dL (ref 31.5–36.0)
MCV: 95.3 fL (ref 79.5–101.0)
MONO#: 0.6 10*3/uL (ref 0.1–0.9)
MONO%: 12.2 % (ref 0.0–14.0)
NEUT#: 2.7 10*3/uL (ref 1.5–6.5)
NEUT%: 59.9 % (ref 38.4–76.8)
Platelets: 99 10*3/uL — ABNORMAL LOW (ref 145–400)
RBC: 2.98 10*6/uL — ABNORMAL LOW (ref 3.70–5.45)
RDW: 16.2 % — AB (ref 11.2–14.5)
WBC: 4.6 10*3/uL (ref 3.9–10.3)

## 2015-05-16 LAB — COMPREHENSIVE METABOLIC PANEL
ALBUMIN: 3.3 g/dL — AB (ref 3.5–5.0)
ALK PHOS: 94 U/L (ref 40–150)
ALT: 23 U/L (ref 0–55)
AST: 22 U/L (ref 5–34)
Anion Gap: 6 mEq/L (ref 3–11)
BUN: 16.9 mg/dL (ref 7.0–26.0)
CALCIUM: 9.2 mg/dL (ref 8.4–10.4)
CO2: 25 mEq/L (ref 22–29)
Chloride: 109 mEq/L (ref 98–109)
Creatinine: 1.2 mg/dL — ABNORMAL HIGH (ref 0.6–1.1)
EGFR: 57 mL/min/{1.73_m2} — AB (ref >90)
GLUCOSE: 147 mg/dL — AB (ref 70–140)
Potassium: 4.4 mEq/L (ref 3.5–5.1)
SODIUM: 140 meq/L (ref 136–145)
TOTAL PROTEIN: 6.6 g/dL (ref 6.4–8.3)

## 2015-05-16 MED ORDER — LEUCOVORIN CALCIUM INJECTION 350 MG
400.0000 mg/m2 | Freq: Once | INTRAVENOUS | Status: AC
  Administered 2015-05-16: 716 mg via INTRAVENOUS
  Filled 2015-05-16: qty 35.8

## 2015-05-16 MED ORDER — SODIUM CHLORIDE 0.9 % IV SOLN
Freq: Once | INTRAVENOUS | Status: AC
  Administered 2015-05-16: 14:00:00 via INTRAVENOUS
  Filled 2015-05-16: qty 4

## 2015-05-16 MED ORDER — DEXTROSE 5 % IV SOLN
Freq: Once | INTRAVENOUS | Status: AC
  Administered 2015-05-16: 15:00:00 via INTRAVENOUS

## 2015-05-16 MED ORDER — SODIUM CHLORIDE 0.9 % IV SOLN
5.0000 mg/kg | Freq: Once | INTRAVENOUS | Status: AC
  Administered 2015-05-16: 350 mg via INTRAVENOUS
  Filled 2015-05-16: qty 14

## 2015-05-16 MED ORDER — SODIUM CHLORIDE 0.9 % IV SOLN
Freq: Once | INTRAVENOUS | Status: AC
  Administered 2015-05-16: 13:00:00 via INTRAVENOUS

## 2015-05-16 MED ORDER — SODIUM CHLORIDE 0.9 % IJ SOLN
10.0000 mL | INTRAMUSCULAR | Status: DC | PRN
  Administered 2015-05-16: 10 mL via INTRAVENOUS
  Filled 2015-05-16: qty 10

## 2015-05-16 MED ORDER — OXALIPLATIN CHEMO INJECTION 100 MG/20ML
70.0000 mg/m2 | Freq: Once | INTRAVENOUS | Status: AC
  Administered 2015-05-16: 125 mg via INTRAVENOUS
  Filled 2015-05-16: qty 15

## 2015-05-16 MED ORDER — SODIUM CHLORIDE 0.9 % IV SOLN
2200.0000 mg/m2 | INTRAVENOUS | Status: DC
  Administered 2015-05-16: 3950 mg via INTRAVENOUS
  Filled 2015-05-16: qty 79

## 2015-05-16 NOTE — Progress Notes (Signed)
Ok to treat with platelet: 99 per MD Burr Medico

## 2015-05-16 NOTE — Patient Instructions (Signed)

## 2015-05-16 NOTE — Telephone Encounter (Signed)
Added flush per pof pt aware

## 2015-05-16 NOTE — Patient Instructions (Signed)
[facility] Discharge Instructions for Patients Receiving Chemotherapy  Today you received the following chemotherapy agents FOLFOX/Avastin.  To help prevent nausea and vomiting after your treatment, we encourage you to take your nausea medication as directed.    If you develop nausea and vomiting that is not controlled by your nausea medication, call the clinic.   BELOW ARE SYMPTOMS THAT SHOULD BE REPORTED IMMEDIATELY:  *FEVER GREATER THAN 100.5 F  *CHILLS WITH OR WITHOUT FEVER  NAUSEA AND VOMITING THAT IS NOT CONTROLLED WITH YOUR NAUSEA MEDICATION  *UNUSUAL SHORTNESS OF BREATH  *UNUSUAL BRUISING OR BLEEDING  TENDERNESS IN MOUTH AND THROAT WITH OR WITHOUT PRESENCE OF ULCERS  *URINARY PROBLEMS  *BOWEL PROBLEMS  UNUSUAL RASH Items with * indicate a potential emergency and should be followed up as soon as possible.  Feel free to call the clinic you have any questions or concerns. The clinic phone number is [phone].  Please show the CHEMO ALERT CARD at check-in to the Emergency Department and triage nurse.   

## 2015-05-18 ENCOUNTER — Ambulatory Visit (HOSPITAL_BASED_OUTPATIENT_CLINIC_OR_DEPARTMENT_OTHER): Payer: BLUE CROSS/BLUE SHIELD

## 2015-05-18 VITALS — BP 140/63 | HR 89 | Temp 98.7°F | Resp 16

## 2015-05-18 DIAGNOSIS — C787 Secondary malignant neoplasm of liver and intrahepatic bile duct: Principal | ICD-10-CM

## 2015-05-18 DIAGNOSIS — C187 Malignant neoplasm of sigmoid colon: Secondary | ICD-10-CM

## 2015-05-18 DIAGNOSIS — Z452 Encounter for adjustment and management of vascular access device: Secondary | ICD-10-CM

## 2015-05-18 DIAGNOSIS — C189 Malignant neoplasm of colon, unspecified: Secondary | ICD-10-CM

## 2015-05-18 MED ORDER — SODIUM CHLORIDE 0.9 % IJ SOLN
10.0000 mL | INTRAMUSCULAR | Status: DC | PRN
  Administered 2015-05-18: 10 mL
  Filled 2015-05-18: qty 10

## 2015-05-18 MED ORDER — HEPARIN SOD (PORK) LOCK FLUSH 100 UNIT/ML IV SOLN
500.0000 [IU] | Freq: Once | INTRAVENOUS | Status: AC | PRN
  Administered 2015-05-18: 500 [IU]
  Filled 2015-05-18: qty 5

## 2015-05-30 ENCOUNTER — Telehealth: Payer: Self-pay | Admitting: Hematology

## 2015-05-30 ENCOUNTER — Ambulatory Visit (HOSPITAL_BASED_OUTPATIENT_CLINIC_OR_DEPARTMENT_OTHER): Payer: BLUE CROSS/BLUE SHIELD | Admitting: Hematology

## 2015-05-30 ENCOUNTER — Encounter: Payer: Self-pay | Admitting: Hematology

## 2015-05-30 ENCOUNTER — Ambulatory Visit (HOSPITAL_BASED_OUTPATIENT_CLINIC_OR_DEPARTMENT_OTHER): Payer: BLUE CROSS/BLUE SHIELD

## 2015-05-30 ENCOUNTER — Other Ambulatory Visit (HOSPITAL_BASED_OUTPATIENT_CLINIC_OR_DEPARTMENT_OTHER): Payer: BLUE CROSS/BLUE SHIELD

## 2015-05-30 VITALS — BP 148/73 | HR 84

## 2015-05-30 VITALS — BP 138/64 | HR 84 | Temp 98.3°F | Resp 18 | Wt 162.0 lb

## 2015-05-30 DIAGNOSIS — E119 Type 2 diabetes mellitus without complications: Secondary | ICD-10-CM | POA: Diagnosis not present

## 2015-05-30 DIAGNOSIS — C189 Malignant neoplasm of colon, unspecified: Secondary | ICD-10-CM

## 2015-05-30 DIAGNOSIS — Z5112 Encounter for antineoplastic immunotherapy: Secondary | ICD-10-CM | POA: Diagnosis not present

## 2015-05-30 DIAGNOSIS — I10 Essential (primary) hypertension: Secondary | ICD-10-CM

## 2015-05-30 DIAGNOSIS — C187 Malignant neoplasm of sigmoid colon: Secondary | ICD-10-CM

## 2015-05-30 DIAGNOSIS — Z452 Encounter for adjustment and management of vascular access device: Secondary | ICD-10-CM

## 2015-05-30 DIAGNOSIS — C787 Secondary malignant neoplasm of liver and intrahepatic bile duct: Secondary | ICD-10-CM

## 2015-05-30 DIAGNOSIS — D6959 Other secondary thrombocytopenia: Secondary | ICD-10-CM

## 2015-05-30 DIAGNOSIS — N179 Acute kidney failure, unspecified: Secondary | ICD-10-CM

## 2015-05-30 DIAGNOSIS — Z5111 Encounter for antineoplastic chemotherapy: Secondary | ICD-10-CM | POA: Diagnosis not present

## 2015-05-30 DIAGNOSIS — D630 Anemia in neoplastic disease: Secondary | ICD-10-CM | POA: Diagnosis not present

## 2015-05-30 DIAGNOSIS — Z95828 Presence of other vascular implants and grafts: Secondary | ICD-10-CM

## 2015-05-30 LAB — CBC WITH DIFFERENTIAL/PLATELET
BASO%: 0.5 % (ref 0.0–2.0)
BASOS ABS: 0 10*3/uL (ref 0.0–0.1)
EOS ABS: 0.2 10*3/uL (ref 0.0–0.5)
EOS%: 5.1 % (ref 0.0–7.0)
HEMATOCRIT: 29.1 % — AB (ref 34.8–46.6)
HEMOGLOBIN: 9.2 g/dL — AB (ref 11.6–15.9)
LYMPH#: 0.9 10*3/uL (ref 0.9–3.3)
LYMPH%: 20.6 % (ref 14.0–49.7)
MCH: 30.3 pg (ref 25.1–34.0)
MCHC: 31.6 g/dL (ref 31.5–36.0)
MCV: 95.8 fL (ref 79.5–101.0)
MONO#: 0.7 10*3/uL (ref 0.1–0.9)
MONO%: 15.3 % — AB (ref 0.0–14.0)
NEUT%: 58.5 % (ref 38.4–76.8)
NEUTROS ABS: 2.7 10*3/uL (ref 1.5–6.5)
Platelets: 114 10*3/uL — ABNORMAL LOW (ref 145–400)
RBC: 3.04 10*6/uL — ABNORMAL LOW (ref 3.70–5.45)
RDW: 16.9 % — AB (ref 11.2–14.5)
WBC: 4.6 10*3/uL (ref 3.9–10.3)

## 2015-05-30 LAB — COMPREHENSIVE METABOLIC PANEL
ALT: 28 U/L (ref 0–55)
ANION GAP: 7 meq/L (ref 3–11)
AST: 25 U/L (ref 5–34)
Albumin: 3.3 g/dL — ABNORMAL LOW (ref 3.5–5.0)
Alkaline Phosphatase: 87 U/L (ref 40–150)
BUN: 19.5 mg/dL (ref 7.0–26.0)
CALCIUM: 9.4 mg/dL (ref 8.4–10.4)
CHLORIDE: 107 meq/L (ref 98–109)
CO2: 25 mEq/L (ref 22–29)
Creatinine: 1.3 mg/dL — ABNORMAL HIGH (ref 0.6–1.1)
EGFR: 53 mL/min/{1.73_m2} — AB (ref >90)
Glucose: 83 mg/dl (ref 70–140)
POTASSIUM: 4.6 meq/L (ref 3.5–5.1)
Sodium: 139 mEq/L (ref 136–145)
Total Bilirubin: <0.3 mg/dL (ref 0.20–1.20)
Total Protein: 6.9 g/dL (ref 6.4–8.3)

## 2015-05-30 LAB — UA PROTEIN, DIPSTICK - CHCC: Protein, ur: 100 mg/dL

## 2015-05-30 MED ORDER — LEUCOVORIN CALCIUM INJECTION 350 MG
400.0000 mg/m2 | Freq: Once | INTRAVENOUS | Status: AC
  Administered 2015-05-30: 716 mg via INTRAVENOUS
  Filled 2015-05-30: qty 35.8

## 2015-05-30 MED ORDER — SODIUM CHLORIDE 0.9 % IV SOLN
5.0000 mg/kg | Freq: Once | INTRAVENOUS | Status: AC
  Administered 2015-05-30: 350 mg via INTRAVENOUS
  Filled 2015-05-30: qty 14

## 2015-05-30 MED ORDER — OXALIPLATIN CHEMO INJECTION 100 MG/20ML
70.0000 mg/m2 | Freq: Once | INTRAVENOUS | Status: AC
  Administered 2015-05-30: 125 mg via INTRAVENOUS
  Filled 2015-05-30: qty 5

## 2015-05-30 MED ORDER — SODIUM CHLORIDE 0.9 % IV SOLN
Freq: Once | INTRAVENOUS | Status: AC
  Administered 2015-05-30: 10:00:00 via INTRAVENOUS

## 2015-05-30 MED ORDER — SODIUM CHLORIDE 0.9 % IV SOLN
Freq: Once | INTRAVENOUS | Status: AC
  Administered 2015-05-30: 10:00:00 via INTRAVENOUS
  Filled 2015-05-30: qty 4

## 2015-05-30 MED ORDER — SODIUM CHLORIDE 0.9 % IV SOLN
2200.0000 mg/m2 | INTRAVENOUS | Status: DC
  Administered 2015-05-30: 3950 mg via INTRAVENOUS
  Filled 2015-05-30: qty 79

## 2015-05-30 MED ORDER — DEXTROSE 5 % IV SOLN
Freq: Once | INTRAVENOUS | Status: AC
  Administered 2015-05-30: 11:00:00 via INTRAVENOUS

## 2015-05-30 MED ORDER — SODIUM CHLORIDE 0.9 % IJ SOLN
10.0000 mL | INTRAMUSCULAR | Status: DC | PRN
  Administered 2015-05-30: 10 mL via INTRAVENOUS
  Filled 2015-05-30: qty 10

## 2015-05-30 NOTE — Patient Instructions (Signed)
[place] Discharge Instructions for Patients Receiving Chemotherapy  Today you received the following chemotherapy agents:  Avastin, Oxaliplatin, 5FU, and Leucovorin.  To help prevent nausea and vomiting after your treatment, we encourage you to take your nausea medication as prescribed.   If you develop nausea and vomiting that is not controlled by your nausea medication, call the clinic.   BELOW ARE SYMPTOMS THAT SHOULD BE REPORTED IMMEDIATELY:  *FEVER GREATER THAN 100.5 F  *CHILLS WITH OR WITHOUT FEVER  NAUSEA AND VOMITING THAT IS NOT CONTROLLED WITH YOUR NAUSEA MEDICATION  *UNUSUAL SHORTNESS OF BREATH  *UNUSUAL BRUISING OR BLEEDING  TENDERNESS IN MOUTH AND THROAT WITH OR WITHOUT PRESENCE OF ULCERS  *URINARY PROBLEMS  *BOWEL PROBLEMS  UNUSUAL RASH Items with * indicate a potential emergency and should be followed up as soon as possible.  Feel free to call the clinic you have any questions or concerns. The clinic phone number is [phone].  Please show the [place] at check-in to the Emergency Department and triage nurse.

## 2015-05-30 NOTE — Telephone Encounter (Signed)
Gave and printed appt shced and avs for pt for May and JUNE °

## 2015-05-30 NOTE — Progress Notes (Signed)
[facility]  [phone] [phone]  Clinic Follow Up Note   Patient Care Team: Dione Housekeeper, MD as PCP - General (Family Medicine) Tania Ade, RN as Registered Nurse (Oncology) 05/30/2015  CHIEF COMPLAINTS:  Follow-up of metastatic colon cancer  Oncology History   Metastatic colon cancer to liver   Staging form: Colon and Rectum, AJCC 7th Edition     Clinical: Stage Unknown ([place], NX, M1) - Unsigned        Metastatic colon cancer to liver ([place])   07/14/2014 Imaging CT abdomen and pelvis with contrast showed a 6 cm segment of sigmoid bowel wall thickening with adjacent 14 x 12 mm nodules, diffuse hepatic metastasis. No bowel obstruction.   07/14/2014 Initial Diagnosis Metastatic colon cancer to liver   07/25/2014 Procedure Colonoscopy showed a near circumferential medial mass in the sigmoid colon, partially obstructing consistent with carcinoma.   07/25/2014 Initial Biopsy Sigmoid: Biopsy showed invasive adenocarcinoma arising in a background of high-grade dysplasia.   07/28/2014 Imaging CT chest showed no evidence of metastasis   08/09/2014 -  Chemotherapy mFOLFOX6 every 2 weeks, dose reduction from cycle 7 due to cytopenia, Avastin added from cycle 3   08/11/2014 - 08/15/2014 Hospital Admission Pt was admitted for fever and nausea, treated for UTI, liver biopsy cancelled due to the infection     HISTORY OF PRESENTING ILLNESS:  Michelle Erickson 60 y.o. female is here because of abnoaml CT findings which is highly suspecious for metastatic colon cancer.   She presented intermittent nausea, anorexia and abdominal pain since Nov 2015, she has mild pain in the LUQ, crapy pain, positional (bending over),   it happened once every 2-3 weeks, and it has been more frequent in the past month. She has normal BM, but did notice the caliber of the stool is smaller than before. She denies any melena or hematochezia. She lost  about 13 lbs in the past 6 months.   She presents to local emergency room in November 2015, and May 2016, was felt to be related to gastric virus. Due to the worsening symptoms lately, she went to [facility] ED on 07/13/2014, CT abdomen was obtained which showed multiple large liver metastasis and probable sigmoid colon mass. She was referred to [place] for further workup. She is scheduled to see GI Dr. Olevia Perches this afternoon.  CURRENT THERAPY: mFOLFOX6 and Avastin every 2 weeks, started on 08/09/2014   INTERIM HISTORY [name] returns for follow-up and chemo. She is accompanied by her husband to the clinic today. She has been tolerating chemotherapy well overall, does notice mild to moderate fatigue and low appetite for 2-3 days after chemotherapy, but recovers well quickly. She has good appetite and eating well, has gained about 4 pounds in the past few months. No significant neuropathy or other new complaints.  MEDICAL HISTORY:  Past Medical History  Diagnosis Date  . Hyperlipemia   . Hypertension   . Wears glasses     contacts/glasses  . Snoring   . Back pain   . Family history of colon cancer   . Diabetes mellitus without complication ([place])   . Metastatic colon cancer to liver ([place]) 07/21/2014    SURGICAL HISTORY: Past Surgical History  Procedure Laterality Date  . Mouth surgery    . Cataract extraction Bilateral   . Portacath placement N/A 08/03/2014    Procedure: INSERTION PORT-A-CATH;  Surgeon: Stark Klein, MD;  Location: [facility];  Service: General;  Laterality: N/A;  SOCIAL HISTORY: Social History   Social History  . Marital Status: Married    Spouse Name: Bethann Berkshire  . Number of Children: 2  . Years of Education: N/A   Occupational History  . Not on file.   Social History Main Topics  . Smoking status: Former Smoker -- 1.00 packs/day for 6 years    Types: Cigarettes    Quit date: 01/21/1976  . Smokeless tobacco: Not on file  . Alcohol Use: No  . Drug Use: No  .  Sexual Activity: Yes    Birth Control/ Protection: Post-menopausal     Comment: # 2 pregnancies and #2 live births   Other Topics Concern  . Not on file   Social History Narrative   Married, husband [name]   Has #2 adult children   Previously worked at [employer]       FAMILY HISTORY: Family History  Problem Relation Age of Onset  . COPD Father   . Heart attack Father   . Arthritis Sister   . Diabetes Sister   . Colon cancer Sister     dx over 42  . Colon polyps Sister   . Seizures Brother   . Colon cancer Sister     dx in her early 26s, died in her early to mid 81s  . Diabetes Mother     ALLERGIES:  has No Known Allergies.  MEDICATIONS:  Current Outpatient Prescriptions  Medication Sig Dispense Refill  . amLODipine (NORVASC) 5 MG tablet Take 2 tablets (10 mg total) by mouth daily. 30 tablet 0  . atorvastatin (LIPITOR) 40 MG tablet Take 40 mg by mouth daily.    . cetirizine (ZYRTEC) 10 MG tablet Take 10 mg by mouth daily as needed for allergies. Reported on 05/02/2015    . ferrous sulfate 325 (65 FE) MG tablet Take 325 mg by mouth daily with breakfast.    . Insulin Glargine (LANTUS SOLOSTAR) 100 UNIT/ML Solostar Pen Inject 23 Units into the skin daily at 10 pm.    . insulin lispro (HUMALOG) 100 UNIT/ML injection Inject 4-8 Units into the skin 2 (two) times daily as needed for high blood sugar. Sliding scale    . lidocaine-prilocaine (EMLA) cream     . ondansetron (ZOFRAN-ODT) 8 MG disintegrating tablet Take 8 mg by mouth every 8 (eight) hours as needed for nausea or vomiting. Reported on 05/02/2015     No current facility-administered medications for this visit.   Facility-Administered Medications Ordered in Other Visits  Medication Dose Route Frequency Provider Last Rate Last Dose  . dextrose 5 % solution   Intravenous Once Malachy Mood, MD      . fluorouracil (ADRUCIL) 3,950 mg in sodium chloride 0.9 % 71 mL chemo infusion  2,200 mg/m2 (Treatment Plan Actual)  Intravenous 1 day or 1 dose Malachy Mood, MD      . leucovorin 716 mg in dextrose 5 % 250 mL infusion  400 mg/m2 (Treatment Plan Actual) Intravenous Once Malachy Mood, MD      . ondansetron (ZOFRAN) 8 mg, dexamethasone (DECADRON) 10 mg in sodium chloride 0.9 % 50 mL IVPB   Intravenous Once Malachy Mood, MD      . oxaliplatin (ELOXATIN) 125 mg in dextrose 5 % 500 mL chemo infusion  70 mg/m2 (Treatment Plan Actual) Intravenous Once Malachy Mood, MD        REVIEW OF SYSTEMS:   Constitutional: Denies fevers, chills or abnormal night sweats Eyes: Denies blurriness of vision, double vision or watery  eyes Ears, nose, mouth, throat, and face: Denies mucositis or sore throat Respiratory: Denies cough, dyspnea or wheezes Cardiovascular: Denies palpitation, chest discomfort or lower extremity swelling Gastrointestinal:  Denies nausea, heartburn or change in bowel habits Skin: Denies abnormal skin rashes Lymphatics: Denies new lymphadenopathy or easy bruising Neurological:Denies numbness, tingling or new weaknesses Behavioral/Psych: Mood is stable, no new changes  All other systems were reviewed with the patient and are negative.  PHYSICAL EXAMINATION: ECOG PERFORMANCE STATUS: 0  BP 138/64 mmHg  Pulse 84  Temp(Src) 98.3 F (36.8 C) (Oral)  Resp 18  Wt 162 lb (73.483 kg)  SpO2 100% GENERAL:alert, no distress and comfortable SKIN: skin color, texture, turgor are normal, no rashes or significant lesions EYES: normal, conjunctiva are pink and non-injected, sclera clear OROPHARYNX:no exudate, no erythema and lips, buccal mucosa, and tongue normal  NECK: supple, thyroid normal size, non-tender, without nodularity LYMPH:  no palpable lymphadenopathy in the cervical, axillary or inguinal LUNGS: clear to auscultation and percussion with normal breathing effort HEART: regular rate & rhythm and no murmurs and no lower extremity edema ABDOMEN:abdomen soft, non-tender and normal bowel sounds Musculoskeletal:no cyanosis  of digits and no clubbing  PSYCH: alert & oriented x 3 with fluent speech NEURO: no focal motor/sensory deficits  LABORATORY DATA:  I have reviewed the data as listed CBC Latest Ref Rng 05/30/2015 05/16/2015 05/02/2015  WBC 3.9 - 10.3 10e3/uL 4.6 4.6 4.4  Hemoglobin 11.6 - 15.9 g/dL 9.2(L) 9.0(L) 9.1(L)  Hematocrit 34.8 - 46.6 % 29.1(L) 28.4(L) 28.2(L)  Platelets 145 - 400 10e3/uL 114(L) 99(L) 101(L)   CMP Latest Ref Rng 05/30/2015 05/16/2015 05/02/2015  Glucose 70 - 140 mg/dl 83 147(H) 200(H)  BUN 7.0 - 26.0 mg/dL 19.5 16.9 19.8  Creatinine 0.6 - 1.1 mg/dL 1.3(H) 1.2(H) 1.2(H)  Sodium 136 - 145 mEq/L 139 140 139  Potassium 3.5 - 5.1 mEq/L 4.6 4.4 4.8  CO2 22 - 29 mEq/L '25 25 25  '$ Calcium 8.4 - 10.4 mg/dL 9.4 9.2 9.3  Total Protein 6.4 - 8.3 g/dL 6.9 6.6 7.0  Total Bilirubin 0.20 - 1.20 mg/dL <0.30 <0.30 <0.30  Alkaline Phos 40 - 150 U/L 87 94 90  AST 5 - 34 U/L 25 22 33  ALT 0 - 55 U/L 28 23 32   Results for KIEU, QUIGGLE (MRN [id number]) as of 05/30/2015 09:53  Ref. Range 02/07/2015 08:19 03/07/2015 08:20 04/04/2015 09:58 05/02/2015 10:58  CEA Latest Units: ng/mL 6.0 (H) 6.6    CEA Latest Ref Range: 0.0-4.7 ng/mL 16.0 (H) 17.7 (H) 18.3 (H) 20.8 (H)     PATHOLOGY REPORTS: Diagnosis 07/25/2014  Surgical [P], sigmoid - INVASIVE ADENOCARCINOMA ARISING IN A BACKGROUND OF HIGH GRADE DYSPLASIA. - SEE COMMENT. Microscopic Comment Results are phoned to Dr. Olevia Perches. Internal departmental review obtained (Dr. Donato Heinz) with agreement.  Diagnosis 09/05/2014 Liver, biopsy - METASTATIC ADENOCARCINOMA, SEE COMMENT. Microscopic Comment Given the patient's history of sigmoid invasive adenocarcinoma and the morphology, the findings are consistent with metastatic colorectal adenocarcinoma. The case was called to Dr. Burr Medico. Per request, MMR, MSI, and KRAS testing will be ordered.  KRAS MUTATION (+) c36G>A (p. G12D)  RADIOGRAPHIC STUDIES: I have personally reviewed the radiological images as listed and  agreed with the findings in the report.   CT chest, abdomen and pelvis with contrast 04/30/2015 IMPRESSION: 1. Hepatic metastases continued to decrease in size. 2. Continued decrease in size of the 6 mm nodule adjacent to the sigmoid colon. 3. Cholelithiasis. 4. Small hiatal hernia. 5. Abdominal  aortic atherosclerosis.    Colonoscopy 07/25/2014 Dr. Olevia Perches  ENDOSCOPIC IMPRESSION: Near circumferential medium mass was found in the sigmoid colon; multiple biopsies were performed using cold forceps, partially obstructing mass consistent with carcinoma. Placement of endoclips and tattoo at the margins of the mass which extends from 18-25 cm from the anus  ASSESSMENT & PLAN: 60 year old African-American female, presented with intermittent nausea vomiting and anemia.   1. Sigmoid colon cancer with liver metastasis, TxNxM1, stage IV, MSI stable, KRAS mutation(+) -I previously reviewed her CT scans, colonoscopy, and colon mass biopsy findings with patient and her husband in great details, images were reviewed in person  -I reviewed her liver biopsy results, which confirmed metastasis from colon cancer. -The natural history of metastatic colon cancer and treatment options were discussed with patient and her husband. Giving the diffuse metastasis in the liver, this is unfortunately incurable disease. I recommend systemic chemotherapy to control her metastatic disease, the goal of therapy is palliative and to prolong her life. -The options of first-line chemotherapy were discussed with her, I recommend FOLFOX and Avastin every 2 weeks intravenously, and I will continue as long as she tolerates and no disease progression -her tumor MSI is stable, KRAS mutated, not a candidate for immunotherapy or EGFR inhibitor. -Her CEA has dropped significantly. I reviewed her restaging CT scan from 04/30/2015 which showed continuous partial response. Due to her initial diffuse liver metastases, I do not think she is a  candidate for liver targeted therapy.  -she is tolerating chemo very well, will continue.  Today's lab results were reviewed with her, adequate for treatment. 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 -Her ferritin is normal, serum iron and saturation slightly low, TIBC low normal, I suggest her to take oral iron supplement, she tolerates well, we'll continue once daily, with orange juice or vitamin C  -will repeat her iron study on next visit   3. DM, HTN -Continue follow-up with PCP - we discussed the potential impact of chemotherapy and premedication dexamethasone on her blood pressure and sugar,will  Monitor closely. Her BG has been well controlled lately  -Her blood pressure has been much better controlled after we increased her Amlodipine to '10mg'$  daily   4. AKI -Cr has been around 1.1-1.3  will continue monitoring renal function  -I encourage her to drink more fluids  -IV fluids as needed  5. Mild thrombocytopenia -Secondary to chemotherapy. -will monitor closely.  PLan -Lab reviewed, adequate for treatment. Continue FOLFOX plus Avastin today and every 2 weeks, no Neulasta -I'll see her back in 4 weeks   All questions were answered. The patient knows to call the clinic with any problems, questions or concerns.  I spent 15 minutes counseling the patient face to face. The total time spent in the appointment was 20 minutes and more than 50% was on counseling.     Truitt Merle, MD 05/30/2015

## 2015-05-30 NOTE — Telephone Encounter (Signed)
Gave and pritned appt sched for May and June

## 2015-05-30 NOTE — Progress Notes (Signed)
Okay to treat with urine protein results of 100 today.

## 2015-05-30 NOTE — Patient Instructions (Signed)

## 2015-05-31 ENCOUNTER — Other Ambulatory Visit: Payer: Self-pay

## 2015-05-31 LAB — CEA: CEA: 21.4 ng/mL — ABNORMAL HIGH (ref 0.0–4.7)

## 2015-06-01 ENCOUNTER — Ambulatory Visit (HOSPITAL_BASED_OUTPATIENT_CLINIC_OR_DEPARTMENT_OTHER): Payer: BLUE CROSS/BLUE SHIELD

## 2015-06-01 DIAGNOSIS — Z452 Encounter for adjustment and management of vascular access device: Secondary | ICD-10-CM | POA: Diagnosis not present

## 2015-06-01 DIAGNOSIS — C187 Malignant neoplasm of sigmoid colon: Secondary | ICD-10-CM

## 2015-06-01 DIAGNOSIS — C189 Malignant neoplasm of colon, unspecified: Secondary | ICD-10-CM

## 2015-06-01 DIAGNOSIS — C787 Secondary malignant neoplasm of liver and intrahepatic bile duct: Principal | ICD-10-CM

## 2015-06-01 MED ORDER — HEPARIN SOD (PORK) LOCK FLUSH 100 UNIT/ML IV SOLN
500.0000 [IU] | Freq: Once | INTRAVENOUS | Status: AC | PRN
  Administered 2015-06-01: 500 [IU]
  Filled 2015-06-01: qty 5

## 2015-06-01 MED ORDER — SODIUM CHLORIDE 0.9 % IJ SOLN
10.0000 mL | INTRAMUSCULAR | Status: DC | PRN
  Administered 2015-06-01: 10 mL
  Filled 2015-06-01: qty 10

## 2015-06-01 NOTE — Patient Instructions (Signed)

## 2015-06-13 ENCOUNTER — Ambulatory Visit (HOSPITAL_BASED_OUTPATIENT_CLINIC_OR_DEPARTMENT_OTHER): Payer: BLUE CROSS/BLUE SHIELD

## 2015-06-13 ENCOUNTER — Ambulatory Visit: Payer: BLUE CROSS/BLUE SHIELD

## 2015-06-13 ENCOUNTER — Other Ambulatory Visit (HOSPITAL_BASED_OUTPATIENT_CLINIC_OR_DEPARTMENT_OTHER): Payer: BLUE CROSS/BLUE SHIELD

## 2015-06-13 VITALS — BP 153/80 | HR 84 | Temp 98.3°F | Resp 18

## 2015-06-13 DIAGNOSIS — Z5112 Encounter for antineoplastic immunotherapy: Secondary | ICD-10-CM | POA: Diagnosis not present

## 2015-06-13 DIAGNOSIS — Z5111 Encounter for antineoplastic chemotherapy: Secondary | ICD-10-CM

## 2015-06-13 DIAGNOSIS — C787 Secondary malignant neoplasm of liver and intrahepatic bile duct: Secondary | ICD-10-CM

## 2015-06-13 DIAGNOSIS — D630 Anemia in neoplastic disease: Secondary | ICD-10-CM

## 2015-06-13 DIAGNOSIS — C189 Malignant neoplasm of colon, unspecified: Secondary | ICD-10-CM

## 2015-06-13 DIAGNOSIS — Z95828 Presence of other vascular implants and grafts: Secondary | ICD-10-CM

## 2015-06-13 LAB — CBC WITH DIFFERENTIAL/PLATELET
BASO%: 0.7 % (ref 0.0–2.0)
BASOS ABS: 0 10*3/uL (ref 0.0–0.1)
EOS ABS: 0.2 10*3/uL (ref 0.0–0.5)
EOS%: 4.2 % (ref 0.0–7.0)
HCT: 28.2 % — ABNORMAL LOW (ref 34.8–46.6)
HEMOGLOBIN: 9.1 g/dL — AB (ref 11.6–15.9)
LYMPH%: 20.4 % (ref 14.0–49.7)
MCH: 31.3 pg (ref 25.1–34.0)
MCHC: 32.4 g/dL (ref 31.5–36.0)
MCV: 96.6 fL (ref 79.5–101.0)
MONO#: 0.7 10*3/uL (ref 0.1–0.9)
MONO%: 18.7 % — AB (ref 0.0–14.0)
NEUT%: 56 % (ref 38.4–76.8)
NEUTROS ABS: 2.2 10*3/uL (ref 1.5–6.5)
PLATELETS: 113 10*3/uL — AB (ref 145–400)
RBC: 2.92 10*6/uL — AB (ref 3.70–5.45)
RDW: 17.7 % — ABNORMAL HIGH (ref 11.2–14.5)
WBC: 4 10*3/uL (ref 3.9–10.3)
lymph#: 0.8 10*3/uL — ABNORMAL LOW (ref 0.9–3.3)

## 2015-06-13 LAB — COMPREHENSIVE METABOLIC PANEL
ALBUMIN: 3.4 g/dL — AB (ref 3.5–5.0)
ALK PHOS: 106 U/L (ref 40–150)
ALT: 26 U/L (ref 0–55)
AST: 33 U/L (ref 5–34)
Anion Gap: 9 mEq/L (ref 3–11)
BUN: 21.5 mg/dL (ref 7.0–26.0)
CHLORIDE: 108 meq/L (ref 98–109)
CO2: 23 mEq/L (ref 22–29)
Calcium: 9.4 mg/dL (ref 8.4–10.4)
Creatinine: 1.2 mg/dL — ABNORMAL HIGH (ref 0.6–1.1)
EGFR: 56 mL/min/{1.73_m2} — AB (ref >90)
GLUCOSE: 152 mg/dL — AB (ref 70–140)
POTASSIUM: 4.5 meq/L (ref 3.5–5.1)
SODIUM: 140 meq/L (ref 136–145)
Total Bilirubin: <0.3 mg/dL (ref 0.20–1.20)
Total Protein: 6.9 g/dL (ref 6.4–8.3)

## 2015-06-13 LAB — IRON AND TIBC
%SAT: 17 % — AB (ref 21–57)
IRON: 54 ug/dL (ref 41–142)
TIBC: 326 ug/dL (ref 236–444)
UIBC: 272 ug/dL (ref 120–384)

## 2015-06-13 LAB — FERRITIN: Ferritin: 222 ng/ml (ref 9–269)

## 2015-06-13 MED ORDER — SODIUM CHLORIDE 0.9 % IJ SOLN
10.0000 mL | INTRAMUSCULAR | Status: DC | PRN
  Filled 2015-06-13: qty 10

## 2015-06-13 MED ORDER — OXALIPLATIN CHEMO INJECTION 100 MG/20ML
70.0000 mg/m2 | Freq: Once | INTRAVENOUS | Status: AC
  Administered 2015-06-13: 125 mg via INTRAVENOUS
  Filled 2015-06-13: qty 5

## 2015-06-13 MED ORDER — SODIUM CHLORIDE 0.9 % IV SOLN
Freq: Once | INTRAVENOUS | Status: AC
  Administered 2015-06-13: 10:00:00 via INTRAVENOUS
  Filled 2015-06-13: qty 4

## 2015-06-13 MED ORDER — DEXTROSE 5 % IV SOLN
Freq: Once | INTRAVENOUS | Status: AC
  Administered 2015-06-13: 10:00:00 via INTRAVENOUS

## 2015-06-13 MED ORDER — LEUCOVORIN CALCIUM INJECTION 350 MG
400.0000 mg/m2 | Freq: Once | INTRAVENOUS | Status: AC
  Administered 2015-06-13: 716 mg via INTRAVENOUS
  Filled 2015-06-13: qty 35.8

## 2015-06-13 MED ORDER — SODIUM CHLORIDE 0.9 % IV SOLN
Freq: Once | INTRAVENOUS | Status: AC
  Administered 2015-06-13: 09:00:00 via INTRAVENOUS

## 2015-06-13 MED ORDER — BEVACIZUMAB CHEMO INJECTION 400 MG/16ML
5.0000 mg/kg | Freq: Once | INTRAVENOUS | Status: AC
  Administered 2015-06-13: 350 mg via INTRAVENOUS
  Filled 2015-06-13: qty 14

## 2015-06-13 MED ORDER — SODIUM CHLORIDE 0.9 % IJ SOLN
10.0000 mL | INTRAMUSCULAR | Status: DC | PRN
  Administered 2015-06-13: 10 mL via INTRAVENOUS
  Filled 2015-06-13: qty 10

## 2015-06-13 MED ORDER — HEPARIN SOD (PORK) LOCK FLUSH 100 UNIT/ML IV SOLN
500.0000 [IU] | Freq: Once | INTRAVENOUS | Status: DC | PRN
  Filled 2015-06-13: qty 5

## 2015-06-13 MED ORDER — SODIUM CHLORIDE 0.9 % IV SOLN
2200.0000 mg/m2 | INTRAVENOUS | Status: DC
  Administered 2015-06-13: 3950 mg via INTRAVENOUS
  Filled 2015-06-13: qty 79

## 2015-06-13 NOTE — Patient Instructions (Signed)
[facility] Discharge Instructions for Patients Receiving Chemotherapy  Today you received the following chemotherapy agents:  Avastin, Oxaliplatin, 5FU, and Leucovorin.  To help prevent nausea and vomiting after your treatment, we encourage you to take your nausea medication as prescribed.   If you develop nausea and vomiting that is not controlled by your nausea medication, call the clinic.   BELOW ARE SYMPTOMS THAT SHOULD BE REPORTED IMMEDIATELY:  *FEVER GREATER THAN 100.5 F  *CHILLS WITH OR WITHOUT FEVER  NAUSEA AND VOMITING THAT IS NOT CONTROLLED WITH YOUR NAUSEA MEDICATION  *UNUSUAL SHORTNESS OF BREATH  *UNUSUAL BRUISING OR BLEEDING  TENDERNESS IN MOUTH AND THROAT WITH OR WITHOUT PRESENCE OF ULCERS  *URINARY PROBLEMS  *BOWEL PROBLEMS  UNUSUAL RASH Items with * indicate a potential emergency and should be followed up as soon as possible.  Feel free to call the clinic you have any questions or concerns. The clinic phone number is [phone].  Please show the [facility] at check-in to the Emergency Department and triage nurse.

## 2015-06-13 NOTE — Patient Instructions (Signed)

## 2015-06-15 ENCOUNTER — Ambulatory Visit (HOSPITAL_BASED_OUTPATIENT_CLINIC_OR_DEPARTMENT_OTHER): Payer: BLUE CROSS/BLUE SHIELD

## 2015-06-15 VITALS — BP 138/65 | HR 90 | Temp 98.2°F | Resp 18

## 2015-06-15 DIAGNOSIS — C189 Malignant neoplasm of colon, unspecified: Secondary | ICD-10-CM

## 2015-06-15 DIAGNOSIS — Z95828 Presence of other vascular implants and grafts: Secondary | ICD-10-CM

## 2015-06-15 DIAGNOSIS — C787 Secondary malignant neoplasm of liver and intrahepatic bile duct: Secondary | ICD-10-CM | POA: Diagnosis not present

## 2015-06-15 DIAGNOSIS — Z452 Encounter for adjustment and management of vascular access device: Secondary | ICD-10-CM

## 2015-06-15 MED ORDER — HEPARIN SOD (PORK) LOCK FLUSH 100 UNIT/ML IV SOLN
500.0000 [IU] | Freq: Once | INTRAVENOUS | Status: AC | PRN
Start: 2015-06-15 — End: 2015-06-15
  Administered 2015-06-15: 500 [IU] via INTRAVENOUS
  Filled 2015-06-15: qty 5

## 2015-06-15 MED ORDER — SODIUM CHLORIDE 0.9 % IJ SOLN
10.0000 mL | INTRAMUSCULAR | Status: DC | PRN
  Administered 2015-06-15: 10 mL via INTRAVENOUS
  Filled 2015-06-15: qty 10

## 2015-06-27 ENCOUNTER — Ambulatory Visit (HOSPITAL_BASED_OUTPATIENT_CLINIC_OR_DEPARTMENT_OTHER): Payer: BLUE CROSS/BLUE SHIELD | Admitting: Hematology

## 2015-06-27 ENCOUNTER — Encounter: Payer: Self-pay | Admitting: Hematology

## 2015-06-27 ENCOUNTER — Other Ambulatory Visit (HOSPITAL_BASED_OUTPATIENT_CLINIC_OR_DEPARTMENT_OTHER): Payer: BLUE CROSS/BLUE SHIELD

## 2015-06-27 ENCOUNTER — Ambulatory Visit (HOSPITAL_BASED_OUTPATIENT_CLINIC_OR_DEPARTMENT_OTHER): Payer: BLUE CROSS/BLUE SHIELD

## 2015-06-27 ENCOUNTER — Telehealth: Payer: Self-pay | Admitting: Hematology

## 2015-06-27 ENCOUNTER — Telehealth: Payer: Self-pay | Admitting: *Deleted

## 2015-06-27 VITALS — BP 172/87 | HR 87 | Temp 98.0°F | Resp 18 | Ht 64.0 in | Wt 161.4 lb

## 2015-06-27 VITALS — BP 154/71 | HR 80

## 2015-06-27 DIAGNOSIS — C187 Malignant neoplasm of sigmoid colon: Secondary | ICD-10-CM

## 2015-06-27 DIAGNOSIS — E119 Type 2 diabetes mellitus without complications: Secondary | ICD-10-CM

## 2015-06-27 DIAGNOSIS — C189 Malignant neoplasm of colon, unspecified: Secondary | ICD-10-CM

## 2015-06-27 DIAGNOSIS — Z5111 Encounter for antineoplastic chemotherapy: Secondary | ICD-10-CM

## 2015-06-27 DIAGNOSIS — C787 Secondary malignant neoplasm of liver and intrahepatic bile duct: Secondary | ICD-10-CM

## 2015-06-27 DIAGNOSIS — I10 Essential (primary) hypertension: Secondary | ICD-10-CM

## 2015-06-27 DIAGNOSIS — N179 Acute kidney failure, unspecified: Secondary | ICD-10-CM

## 2015-06-27 DIAGNOSIS — Z5112 Encounter for antineoplastic immunotherapy: Secondary | ICD-10-CM

## 2015-06-27 DIAGNOSIS — D630 Anemia in neoplastic disease: Secondary | ICD-10-CM | POA: Diagnosis not present

## 2015-06-27 DIAGNOSIS — Z452 Encounter for adjustment and management of vascular access device: Secondary | ICD-10-CM | POA: Diagnosis not present

## 2015-06-27 DIAGNOSIS — Z95828 Presence of other vascular implants and grafts: Secondary | ICD-10-CM

## 2015-06-27 DIAGNOSIS — D6959 Other secondary thrombocytopenia: Secondary | ICD-10-CM

## 2015-06-27 DIAGNOSIS — T451X5A Adverse effect of antineoplastic and immunosuppressive drugs, initial encounter: Secondary | ICD-10-CM

## 2015-06-27 LAB — CBC WITH DIFFERENTIAL/PLATELET
BASO%: 0.2 % (ref 0.0–2.0)
Basophils Absolute: 0 10*3/uL (ref 0.0–0.1)
EOS ABS: 0.2 10*3/uL (ref 0.0–0.5)
EOS%: 4.1 % (ref 0.0–7.0)
HCT: 28.6 % — ABNORMAL LOW (ref 34.8–46.6)
HEMOGLOBIN: 9.2 g/dL — AB (ref 11.6–15.9)
LYMPH%: 24.1 % (ref 14.0–49.7)
MCH: 30.9 pg (ref 25.1–34.0)
MCHC: 32.2 g/dL (ref 31.5–36.0)
MCV: 96 fL (ref 79.5–101.0)
MONO#: 0.7 10*3/uL (ref 0.1–0.9)
MONO%: 13.7 % (ref 0.0–14.0)
NEUT%: 57.9 % (ref 38.4–76.8)
NEUTROS ABS: 2.8 10*3/uL (ref 1.5–6.5)
Platelets: 118 10*3/uL — ABNORMAL LOW (ref 145–400)
RBC: 2.98 10*6/uL — AB (ref 3.70–5.45)
RDW: 16.5 % — AB (ref 11.2–14.5)
WBC: 4.9 10*3/uL (ref 3.9–10.3)
lymph#: 1.2 10*3/uL (ref 0.9–3.3)

## 2015-06-27 LAB — COMPREHENSIVE METABOLIC PANEL
ALBUMIN: 3.3 g/dL — AB (ref 3.5–5.0)
ALT: 34 U/L (ref 0–55)
ANION GAP: 8 meq/L (ref 3–11)
AST: 28 U/L (ref 5–34)
Alkaline Phosphatase: 103 U/L (ref 40–150)
BUN: 19.2 mg/dL (ref 7.0–26.0)
CALCIUM: 9.2 mg/dL (ref 8.4–10.4)
CHLORIDE: 107 meq/L (ref 98–109)
CO2: 24 mEq/L (ref 22–29)
CREATININE: 1.3 mg/dL — AB (ref 0.6–1.1)
EGFR: 50 mL/min/{1.73_m2} — ABNORMAL LOW (ref >90)
Glucose: 217 mg/dl — ABNORMAL HIGH (ref 70–140)
Potassium: 4.7 mEq/L (ref 3.5–5.1)
Sodium: 138 mEq/L (ref 136–145)
TOTAL PROTEIN: 7 g/dL (ref 6.4–8.3)

## 2015-06-27 MED ORDER — OXALIPLATIN CHEMO INJECTION 100 MG/20ML
70.0000 mg/m2 | Freq: Once | INTRAVENOUS | Status: AC
  Administered 2015-06-27: 125 mg via INTRAVENOUS
  Filled 2015-06-27: qty 20

## 2015-06-27 MED ORDER — SODIUM CHLORIDE 0.9 % IJ SOLN
10.0000 mL | INTRAMUSCULAR | Status: DC | PRN
  Administered 2015-06-27: 10 mL via INTRAVENOUS
  Filled 2015-06-27: qty 10

## 2015-06-27 MED ORDER — DEXTROSE 5 % IV SOLN
Freq: Once | INTRAVENOUS | Status: AC
  Administered 2015-06-27: 11:00:00 via INTRAVENOUS

## 2015-06-27 MED ORDER — SODIUM CHLORIDE 0.9 % IV SOLN
Freq: Once | INTRAVENOUS | Status: AC
  Administered 2015-06-27: 10:00:00 via INTRAVENOUS

## 2015-06-27 MED ORDER — FLUOROURACIL CHEMO INJECTION 5 GM/100ML
2200.0000 mg/m2 | INTRAVENOUS | Status: DC
  Administered 2015-06-27: 3950 mg via INTRAVENOUS
  Filled 2015-06-27: qty 79

## 2015-06-27 MED ORDER — SODIUM CHLORIDE 0.9 % IV SOLN
Freq: Once | INTRAVENOUS | Status: AC
  Administered 2015-06-27: 10:00:00 via INTRAVENOUS
  Filled 2015-06-27: qty 4

## 2015-06-27 MED ORDER — BEVACIZUMAB CHEMO INJECTION 400 MG/16ML
5.0000 mg/kg | Freq: Once | INTRAVENOUS | Status: AC
  Administered 2015-06-27: 350 mg via INTRAVENOUS
  Filled 2015-06-27: qty 14

## 2015-06-27 MED ORDER — LEUCOVORIN CALCIUM INJECTION 350 MG
400.0000 mg/m2 | Freq: Once | INTRAVENOUS | Status: AC
  Administered 2015-06-27: 716 mg via INTRAVENOUS
  Filled 2015-06-27: qty 35.8

## 2015-06-27 NOTE — Telephone Encounter (Signed)
Per staff message and POF I have scheduled appts. Advised scheduler of appts. JMW  

## 2015-06-27 NOTE — Progress Notes (Signed)
[place]  [phone] [phone]  Clinic Follow Up Note   Patient Care Team: Dione Housekeeper, MD as PCP - General (Family Medicine) Tania Ade, RN as Registered Nurse (Oncology) 06/27/2015  CHIEF COMPLAINTS:  Follow-up of metastatic colon cancer  Oncology History   Metastatic colon cancer to liver   Staging form: Colon and Rectum, AJCC 7th Edition     Clinical: Stage Unknown ([place], NX, M1) - Unsigned        Metastatic colon cancer to liver ([place])   07/14/2014 Imaging CT abdomen and pelvis with contrast showed a 6 cm segment of sigmoid bowel wall thickening with adjacent 14 x 12 mm nodules, diffuse hepatic metastasis. No bowel obstruction.   07/14/2014 Initial Diagnosis Metastatic colon cancer to liver   07/25/2014 Procedure Colonoscopy showed a near circumferential medial mass in the sigmoid colon, partially obstructing consistent with carcinoma.   07/25/2014 Initial Biopsy Sigmoid: Biopsy showed invasive adenocarcinoma arising in a background of high-grade dysplasia.   07/28/2014 Imaging CT chest showed no evidence of metastasis   08/09/2014 -  Chemotherapy mFOLFOX6 every 2 weeks, dose reduction from cycle 7 due to cytopenia, Avastin added from cycle 3   08/11/2014 - 08/15/2014 Hospital Admission Pt was admitted for fever and nausea, treated for UTI, liver biopsy cancelled due to the infection     HISTORY OF PRESENTING ILLNESS:  Michelle Erickson 60 y.o. female is here because of abnoaml CT findings which is highly suspecious for metastatic colon cancer.   She presented intermittent nausea, anorexia and abdominal pain since Nov 2015, she has mild pain in the LUQ, crapy pain, positional (bending over),   it happened once every 2-3 weeks, and it has been more frequent in the past month. She has normal BM, but did notice the caliber of the stool is smaller than before. She denies any melena or hematochezia. She lost  about 13 lbs in the past 6 months.   She presents to local emergency room in November 2015, and May 2016, was felt to be related to gastric virus. Due to the worsening symptoms lately, she went to [facility] ED on 07/13/2014, CT abdomen was obtained which showed multiple large liver metastasis and probable sigmoid colon mass. She was referred to [place] for further workup. She is scheduled to see GI Dr. Olevia Perches this afternoon.  CURRENT THERAPY: mFOLFOX6 and Avastin every 2 weeks, started on 08/09/2014   INTERIM HISTORY Tekisha returns for follow-up and chemo. She is accompanied by her husband to the clinic today. She is doing very well overall, denies any pain, nausea, or other symptoms. She is tolerating chemotherapy well, mild fatigue after chemotherapy, recovers well. No significant neuropathy or other side effects. She has good appetite and recent energy level, tolerating routine activity without any difficulty. Her weight is stable.  MEDICAL HISTORY:  Past Medical History  Diagnosis Date  . Hyperlipemia   . Hypertension   . Wears glasses     contacts/glasses  . Snoring   . Back pain   . Family history of colon cancer   . Diabetes mellitus without complication ([place])   . Metastatic colon cancer to liver ([place]) 07/21/2014    SURGICAL HISTORY: Past Surgical History  Procedure Laterality Date  . Mouth surgery    . Cataract extraction Bilateral   . Portacath placement N/A 08/03/2014    Procedure: INSERTION PORT-A-CATH;  Surgeon: Stark Klein, MD;  Location: [facility];  Service: General;  Laterality: N/A;  SOCIAL HISTORY: Social History   Social History  . Marital Status: Married    Spouse Name: Charlotte Crumb  . Number of Children: 2  . Years of Education: N/A   Occupational History  . Not on file.   Social History Main Topics  . Smoking status: Former Smoker -- 1.00 packs/day for 6 years    Types: Cigarettes    Quit date: 01/21/1976  . Smokeless tobacco: Not on file  . Alcohol Use:  No  . Drug Use: No  . Sexual Activity: Yes    Birth Control/ Protection: Post-menopausal     Comment: # 2 pregnancies and #2 live births   Other Topics Concern  . Not on file   Social History Narrative   Married, husband [name]   Has #2 adult children   Previously worked at [employer] HISTORY: Family History  Problem Relation Age of Onset  . COPD Father   . Heart attack Father   . Arthritis Sister   . Diabetes Sister   . Colon cancer Sister     dx over 40  . Colon polyps Sister   . Seizures Brother   . Colon cancer Sister     dx in her early 12s, died in her early to mid 1s  . Diabetes Mother     ALLERGIES:  has No Known Allergies.  MEDICATIONS:  Current Outpatient Prescriptions  Medication Sig Dispense Refill  . amLODipine (NORVASC) 5 MG tablet Take 2 tablets (10 mg total) by mouth daily. 30 tablet 0  . atorvastatin (LIPITOR) 40 MG tablet Take 40 mg by mouth daily.    . cetirizine (ZYRTEC) 10 MG tablet Take 10 mg by mouth daily as needed for allergies. Reported on 05/02/2015    . ferrous sulfate 325 (65 FE) MG tablet Take 325 mg by mouth daily with breakfast.    . Insulin Glargine (LANTUS SOLOSTAR) 100 UNIT/ML Solostar Pen Inject 23 Units into the skin daily at 10 pm.    . insulin lispro (HUMALOG) 100 UNIT/ML injection Inject 4-8 Units into the skin 2 (two) times daily as needed for high blood sugar. Sliding scale    . lidocaine-prilocaine (EMLA) cream     . ondansetron (ZOFRAN-ODT) 8 MG disintegrating tablet Take 8 mg by mouth every 8 (eight) hours as needed for nausea or vomiting. Reported on 05/02/2015     No current facility-administered medications for this visit.   Facility-Administered Medications Ordered in Other Visits  Medication Dose Route Frequency Provider Last Rate Last Dose  . sodium chloride 0.9 % injection 10 mL  10 mL Intravenous PRN Truitt Merle, MD   10 mL at 06/27/15 9622    REVIEW OF SYSTEMS:   Constitutional: Denies fevers,  chills or abnormal night sweats Eyes: Denies blurriness of vision, double vision or watery eyes Ears, nose, mouth, throat, and face: Denies mucositis or sore throat Respiratory: Denies cough, dyspnea or wheezes Cardiovascular: Denies palpitation, chest discomfort or lower extremity swelling Gastrointestinal:  Denies nausea, heartburn or change in bowel habits Skin: Denies abnormal skin rashes Lymphatics: Denies new lymphadenopathy or easy bruising Neurological:Denies numbness, tingling or new weaknesses Behavioral/Psych: Mood is stable, no new changes  All other systems were reviewed with the patient and are negative.  PHYSICAL EXAMINATION: ECOG PERFORMANCE STATUS: 0  BP 172/87 mmHg  Pulse 87  Temp(Src) 98 F (36.7 C) (Oral)  Resp 18  Ht '5\' 4"'$  (1.626 m)  Wt 161 lb 6.4 oz (73.211 kg)  BMI 27.69 kg/m2  SpO2 100% GENERAL:alert, no distress and comfortable SKIN: skin color, texture, turgor are normal, no rashes or significant lesions EYES: normal, conjunctiva are pink and non-injected, sclera clear OROPHARYNX:no exudate, no erythema and lips, buccal mucosa, and tongue normal  NECK: supple, thyroid normal size, non-tender, without nodularity LYMPH:  no palpable lymphadenopathy in the cervical, axillary or inguinal LUNGS: clear to auscultation and percussion with normal breathing effort HEART: regular rate & rhythm and no murmurs and no lower extremity edema ABDOMEN:abdomen soft, non-tender and normal bowel sounds Musculoskeletal:no cyanosis of digits and no clubbing  PSYCH: alert & oriented x 3 with fluent speech NEURO: no focal motor/sensory deficits  LABORATORY DATA:  I have reviewed the data as listed CBC Latest Ref Rng 06/27/2015 06/13/2015 05/30/2015  WBC 3.9 - 10.3 10e3/uL 4.9 4.0 4.6  Hemoglobin 11.6 - 15.9 g/dL 9.2(L) 9.1(L) 9.2(L)  Hematocrit 34.8 - 46.6 % 28.6(L) 28.2(L) 29.1(L)  Platelets 145 - 400 10e3/uL 118(L) 113(L) 114(L)   CMP Latest Ref Rng 06/27/2015 06/13/2015  05/30/2015  Glucose 70 - 140 mg/dl 217(H) 152(H) 83  BUN 7.0 - 26.0 mg/dL 19.2 21.5 19.5  Creatinine 0.6 - 1.1 mg/dL 1.3(H) 1.2(H) 1.3(H)  Sodium 136 - 145 mEq/L 138 140 139  Potassium 3.5 - 5.1 mEq/L 4.7 4.5 4.6  CO2 22 - 29 mEq/L '24 23 25  '$ Calcium 8.4 - 10.4 mg/dL 9.2 9.4 9.4  Total Protein 6.4 - 8.3 g/dL 7.0 6.9 6.9  Total Bilirubin 0.20 - 1.20 mg/dL <0.30 <0.30 <0.30  Alkaline Phos 40 - 150 U/L 103 106 87  AST 5 - 34 U/L 28 33 25  ALT 0 - 55 U/L 34 26 28   CEA  Status: Finalresult Visible to patient:  Not Released Nextappt: Today at 09:30 AM in Oncology [facility]) Dx:  Metastatic colon cancer to liver ([place])              Ref Range 4wk ago  60moago  228mogo  40m70moo     CEA 0.0 - 4.7 ng/mL 21.4 (H) 20.8 (H)CM 18.3 (H)CM           PATHOLOGY REPORTS: Diagnosis 07/25/2014  Surgical [P], sigmoid - INVASIVE ADENOCARCINOMA ARISING IN A BACKGROUND OF HIGH GRADE DYSPLASIA. - SEE COMMENT. Microscopic Comment Results are phoned to Dr. BroOlevia Perchesnternal departmental review obtained (Dr. RunDonato Heinzith agreement.  Diagnosis 09/05/2014 Liver, biopsy - METASTATIC ADENOCARCINOMA, SEE COMMENT. Microscopic Comment Given the patient's history of sigmoid invasive adenocarcinoma and the morphology, the findings are consistent with metastatic colorectal adenocarcinoma. The case was called to Dr. FenBurr Medicoer request, MMR, MSI, and KRAS testing will be ordered.  KRAS MUTATION (+) c36G>A (p. G12D)  RADIOGRAPHIC STUDIES: I have personally reviewed the radiological images as listed and agreed with the findings in the report.   CT chest, abdomen and pelvis with contrast 04/30/2015 IMPRESSION: 1. Hepatic metastases continued to decrease in size. 2. Continued decrease in size of the 6 mm nodule adjacent to the sigmoid colon. 3. Cholelithiasis. 4. Small hiatal hernia. 5. Abdominal aortic atherosclerosis.    Colonoscopy 07/25/2014 Dr. BroOlevia PerchesNDOSCOPIC  IMPRESSION: Near circumferential medium mass was found in the sigmoid colon; multiple biopsies were performed using cold forceps, partially obstructing mass consistent with carcinoma. Placement of endoclips and tattoo at the margins of the mass which extends from 18-25 cm from the anus  ASSESSMENT & PLAN: [age]ar old African-American female, presented with intermittent nausea vomiting and anemia.   1. Sigmoid colon cancer  with liver metastasis, TxNxM1, stage IV, MSI stable, KRAS mutation(+) -I previously reviewed her CT scans, colonoscopy, and colon mass biopsy findings with patient and her husband in great details, images were reviewed in person  -I reviewed her liver biopsy results, which confirmed metastasis from colon cancer. -The natural history of metastatic colon cancer and treatment options were discussed with patient and her husband. Giving the diffuse metastasis in the liver, this is unfortunately incurable disease. I recommend systemic chemotherapy to control her metastatic disease, the goal of therapy is palliative and to prolong her life. -The options of first-line chemotherapy were discussed with her, I recommend FOLFOX and Avastin every 2 weeks intravenously, and I will continue as long as she tolerates and no disease progression -her tumor MSI is stable, KRAS mutated, not a candidate for immunotherapy or EGFR inhibitor. -Her CEA has dropped significantly. I reviewed her restaging CT scan from 04/30/2015 which showed continuous partial response. Due to her initial diffuse liver metastases, I do not think she is a candidate for liver targeted therapy.  -she is tolerating chemo very well, will continue.  Today's lab results were reviewed with her, adequate for treatment. 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 -Her ferritin is normal, serum iron and saturation slightly low, TIBC low normal, I suggest her to take oral iron supplement, she tolerates well, we'll continue once daily, with orange juice or vitamin C  -will repeat her iron study 0n 06/13/2015 showed adequate iron level  3. DM, HTN -Continue follow-up with PCP - we discussed the potential impact of chemotherapy and premedication dexamethasone on her blood pressure and sugar,will  Monitor closely. Her BG has been well controlled lately  -Her blood pressure has been high lately, we'll repeat in the infusion room. I encouraged her to measure her blood pressure at home. We discussed Avastin can increase her blood pressure, need to be monitored closely. If SBP persistently high than 150, I'll consider adding on a new antihypertensive medication. She will follow-up with her primary care physician also.  4. AKI -Cr has been around 1.1-1.3  will continue monitoring renal function  -I encourage her to drink more fluids  -IV fluids as needed  5. Mild thrombocytopenia -Secondary to chemotherapy. -will monitor closely.  PLan -Lab reviewed, adequate for treatment. Continue FOLFOX plus Avastin today and every 2 weeks, no Neulasta -She'll monitor blood pressure at home, may need second antihypertensive medication if blood culture persistently high -I'll see her back in 4 weeks with a restaging CT scans 1 week before  All questions were answered. The patient knows to call the clinic with any problems, questions or concerns.  I spent 20 minutes counseling the patient face to face. The total time spent in the appointment was 25 minutes and more than 50% was on counseling.     Truitt Merle, MD 06/27/2015

## 2015-06-27 NOTE — Progress Notes (Signed)
Rechecked BP prior to Avastin. Within acceptable limits at this time.

## 2015-06-27 NOTE — Patient Instructions (Signed)

## 2015-06-27 NOTE — Patient Instructions (Signed)
[facility] Discharge Instructions for Patients Receiving Chemotherapy  Today you received the following chemotherapy agents: Avastin, Oxaliplatin, Leucovorin and 5FU. To help prevent nausea and vomiting after your treatment, we encourage you to take your nausea medication: Zofran 8 mg every 8 hours as needed.   If you develop nausea and vomiting that is not controlled by your nausea medication, call the clinic.   BELOW ARE SYMPTOMS THAT SHOULD BE REPORTED IMMEDIATELY:  *FEVER GREATER THAN 100.5 F  *CHILLS WITH OR WITHOUT FEVER  NAUSEA AND VOMITING THAT IS NOT CONTROLLED WITH YOUR NAUSEA MEDICATION  *UNUSUAL SHORTNESS OF BREATH  *UNUSUAL BRUISING OR BLEEDING  TENDERNESS IN MOUTH AND THROAT WITH OR WITHOUT PRESENCE OF ULCERS  *URINARY PROBLEMS  *BOWEL PROBLEMS  UNUSUAL RASH Items with * indicate a potential emergency and should be followed up as soon as possible.  Feel free to call the clinic you have any questions or concerns. The clinic phone number is [phone].  Please show the [place] at check-in to the Emergency Department and triage nurse.

## 2015-06-27 NOTE — Telephone Encounter (Signed)
per pof to sch pt appt-sent MW email to sch trmt per pof-[p to get updated copy b4 leaving**

## 2015-06-28 LAB — CEA: CEA1: 24.7 ng/mL — AB (ref 0.0–4.7)

## 2015-06-29 ENCOUNTER — Ambulatory Visit (HOSPITAL_BASED_OUTPATIENT_CLINIC_OR_DEPARTMENT_OTHER): Payer: BLUE CROSS/BLUE SHIELD

## 2015-06-29 VITALS — BP 144/68 | HR 92 | Temp 98.4°F | Resp 16

## 2015-06-29 DIAGNOSIS — Z452 Encounter for adjustment and management of vascular access device: Secondary | ICD-10-CM | POA: Diagnosis not present

## 2015-06-29 DIAGNOSIS — C787 Secondary malignant neoplasm of liver and intrahepatic bile duct: Principal | ICD-10-CM

## 2015-06-29 DIAGNOSIS — C187 Malignant neoplasm of sigmoid colon: Secondary | ICD-10-CM | POA: Diagnosis not present

## 2015-06-29 DIAGNOSIS — C189 Malignant neoplasm of colon, unspecified: Secondary | ICD-10-CM

## 2015-06-29 MED ORDER — HEPARIN SOD (PORK) LOCK FLUSH 100 UNIT/ML IV SOLN
500.0000 [IU] | Freq: Once | INTRAVENOUS | Status: AC | PRN
  Administered 2015-06-29: 500 [IU]
  Filled 2015-06-29: qty 5

## 2015-06-29 MED ORDER — SODIUM CHLORIDE 0.9 % IJ SOLN
10.0000 mL | INTRAMUSCULAR | Status: DC | PRN
  Administered 2015-06-29: 10 mL
  Filled 2015-06-29: qty 10

## 2015-07-04 ENCOUNTER — Encounter: Payer: Self-pay | Admitting: Gastroenterology

## 2015-07-11 ENCOUNTER — Ambulatory Visit (HOSPITAL_BASED_OUTPATIENT_CLINIC_OR_DEPARTMENT_OTHER): Payer: BLUE CROSS/BLUE SHIELD

## 2015-07-11 ENCOUNTER — Other Ambulatory Visit (HOSPITAL_BASED_OUTPATIENT_CLINIC_OR_DEPARTMENT_OTHER): Payer: BLUE CROSS/BLUE SHIELD

## 2015-07-11 VITALS — BP 175/81 | HR 93 | Temp 98.2°F | Resp 18

## 2015-07-11 DIAGNOSIS — Z5112 Encounter for antineoplastic immunotherapy: Secondary | ICD-10-CM

## 2015-07-11 DIAGNOSIS — Z452 Encounter for adjustment and management of vascular access device: Secondary | ICD-10-CM

## 2015-07-11 DIAGNOSIS — Z5111 Encounter for antineoplastic chemotherapy: Secondary | ICD-10-CM

## 2015-07-11 DIAGNOSIS — C189 Malignant neoplasm of colon, unspecified: Secondary | ICD-10-CM

## 2015-07-11 DIAGNOSIS — C787 Secondary malignant neoplasm of liver and intrahepatic bile duct: Secondary | ICD-10-CM | POA: Diagnosis not present

## 2015-07-11 DIAGNOSIS — Z95828 Presence of other vascular implants and grafts: Secondary | ICD-10-CM

## 2015-07-11 DIAGNOSIS — C187 Malignant neoplasm of sigmoid colon: Secondary | ICD-10-CM

## 2015-07-11 LAB — CBC WITH DIFFERENTIAL/PLATELET
BASO%: 0.7 % (ref 0.0–2.0)
BASOS ABS: 0 10*3/uL (ref 0.0–0.1)
EOS ABS: 0.2 10*3/uL (ref 0.0–0.5)
EOS%: 3.6 % (ref 0.0–7.0)
HCT: 29.2 % — ABNORMAL LOW (ref 34.8–46.6)
HGB: 9.5 g/dL — ABNORMAL LOW (ref 11.6–15.9)
LYMPH%: 20.2 % (ref 14.0–49.7)
MCH: 31.5 pg (ref 25.1–34.0)
MCHC: 32.5 g/dL (ref 31.5–36.0)
MCV: 96.9 fL (ref 79.5–101.0)
MONO#: 0.7 10*3/uL (ref 0.1–0.9)
MONO%: 15.7 % — AB (ref 0.0–14.0)
NEUT#: 2.9 10*3/uL (ref 1.5–6.5)
NEUT%: 59.8 % (ref 38.4–76.8)
Platelets: 103 10*3/uL — ABNORMAL LOW (ref 145–400)
RBC: 3.01 10*6/uL — AB (ref 3.70–5.45)
RDW: 17.4 % — AB (ref 11.2–14.5)
WBC: 4.8 10*3/uL (ref 3.9–10.3)
lymph#: 1 10*3/uL (ref 0.9–3.3)

## 2015-07-11 LAB — COMPREHENSIVE METABOLIC PANEL
ALBUMIN: 3.3 g/dL — AB (ref 3.5–5.0)
ALK PHOS: 103 U/L (ref 40–150)
ALT: 35 U/L (ref 0–55)
AST: 30 U/L (ref 5–34)
Anion Gap: 8 mEq/L (ref 3–11)
BUN: 17.6 mg/dL (ref 7.0–26.0)
CHLORIDE: 108 meq/L (ref 98–109)
CO2: 23 mEq/L (ref 22–29)
Calcium: 9.4 mg/dL (ref 8.4–10.4)
Creatinine: 1.3 mg/dL — ABNORMAL HIGH (ref 0.6–1.1)
EGFR: 49 mL/min/{1.73_m2} — AB (ref >90)
GLUCOSE: 126 mg/dL (ref 70–140)
POTASSIUM: 4.4 meq/L (ref 3.5–5.1)
SODIUM: 139 meq/L (ref 136–145)
Total Bilirubin: <0.3 mg/dL (ref 0.20–1.20)
Total Protein: 7.1 g/dL (ref 6.4–8.3)

## 2015-07-11 LAB — UA PROTEIN, DIPSTICK - CHCC: PROTEIN: 100 mg/dL

## 2015-07-11 MED ORDER — SODIUM CHLORIDE 0.9 % IV SOLN
2200.0000 mg/m2 | INTRAVENOUS | Status: DC
  Administered 2015-07-11: 3950 mg via INTRAVENOUS
  Filled 2015-07-11: qty 79

## 2015-07-11 MED ORDER — SODIUM CHLORIDE 0.9 % IJ SOLN
10.0000 mL | INTRAMUSCULAR | Status: DC | PRN
  Administered 2015-07-11: 10 mL via INTRAVENOUS
  Filled 2015-07-11: qty 10

## 2015-07-11 MED ORDER — SODIUM CHLORIDE 0.9 % IV SOLN
Freq: Once | INTRAVENOUS | Status: AC
  Administered 2015-07-11: 09:00:00 via INTRAVENOUS
  Filled 2015-07-11: qty 4

## 2015-07-11 MED ORDER — DEXTROSE 5 % IV SOLN
400.0000 mg/m2 | Freq: Once | INTRAVENOUS | Status: AC
  Administered 2015-07-11: 716 mg via INTRAVENOUS
  Filled 2015-07-11: qty 35.8

## 2015-07-11 MED ORDER — DEXTROSE 5 % IV SOLN
70.0000 mg/m2 | Freq: Once | INTRAVENOUS | Status: AC
  Administered 2015-07-11: 125 mg via INTRAVENOUS
  Filled 2015-07-11: qty 10

## 2015-07-11 MED ORDER — SODIUM CHLORIDE 0.9 % IV SOLN
Freq: Once | INTRAVENOUS | Status: AC
  Administered 2015-07-11: 09:00:00 via INTRAVENOUS

## 2015-07-11 MED ORDER — SODIUM CHLORIDE 0.9 % IV SOLN
5.0000 mg/kg | Freq: Once | INTRAVENOUS | Status: AC
  Administered 2015-07-11: 350 mg via INTRAVENOUS
  Filled 2015-07-11: qty 14

## 2015-07-11 MED ORDER — DEXTROSE 5 % IV SOLN
Freq: Once | INTRAVENOUS | Status: AC
  Administered 2015-07-11: 10:00:00 via INTRAVENOUS

## 2015-07-11 NOTE — Patient Instructions (Signed)

## 2015-07-11 NOTE — Patient Instructions (Signed)
[facility] Discharge Instructions for Patients Receiving Chemotherapy  Today you received the following chemotherapy agents: Avastin, Oxaliplatin, Leucovorin and 5FU. To help prevent nausea and vomiting after your treatment, we encourage you to take your nausea medication: Zofran 8 mg every 8 hours as needed.   If you develop nausea and vomiting that is not controlled by your nausea medication, call the clinic.   BELOW ARE SYMPTOMS THAT SHOULD BE REPORTED IMMEDIATELY:  *FEVER GREATER THAN 100.5 F  *CHILLS WITH OR WITHOUT FEVER  NAUSEA AND VOMITING THAT IS NOT CONTROLLED WITH YOUR NAUSEA MEDICATION  *UNUSUAL SHORTNESS OF BREATH  *UNUSUAL BRUISING OR BLEEDING  TENDERNESS IN MOUTH AND THROAT WITH OR WITHOUT PRESENCE OF ULCERS  *URINARY PROBLEMS  *BOWEL PROBLEMS  UNUSUAL RASH Items with * indicate a potential emergency and should be followed up as soon as possible.  Feel free to call the clinic you have any questions or concerns. The clinic phone number is [phone].  Please show the [place] at check-in to the Emergency Department and triage nurse.

## 2015-07-13 ENCOUNTER — Ambulatory Visit (HOSPITAL_BASED_OUTPATIENT_CLINIC_OR_DEPARTMENT_OTHER): Payer: BLUE CROSS/BLUE SHIELD

## 2015-07-13 VITALS — BP 125/66 | HR 91 | Temp 99.1°F | Resp 16

## 2015-07-13 DIAGNOSIS — Z452 Encounter for adjustment and management of vascular access device: Secondary | ICD-10-CM

## 2015-07-13 DIAGNOSIS — C189 Malignant neoplasm of colon, unspecified: Secondary | ICD-10-CM

## 2015-07-13 DIAGNOSIS — C787 Secondary malignant neoplasm of liver and intrahepatic bile duct: Principal | ICD-10-CM

## 2015-07-13 DIAGNOSIS — C187 Malignant neoplasm of sigmoid colon: Secondary | ICD-10-CM | POA: Diagnosis not present

## 2015-07-13 MED ORDER — HEPARIN SOD (PORK) LOCK FLUSH 100 UNIT/ML IV SOLN
500.0000 [IU] | Freq: Once | INTRAVENOUS | Status: AC | PRN
  Administered 2015-07-13: 500 [IU]
  Filled 2015-07-13: qty 5

## 2015-07-13 MED ORDER — SODIUM CHLORIDE 0.9 % IJ SOLN
10.0000 mL | INTRAMUSCULAR | Status: DC | PRN
  Administered 2015-07-13: 10 mL
  Filled 2015-07-13: qty 10

## 2015-07-13 NOTE — Patient Instructions (Signed)
[facility] Discharge Instructions for Patients Receiving Chemotherapy  Today you received the following chemotherapy agents 5FU  To help prevent nausea and vomiting after your treatment, we encourage you to take your nausea medication as prescribed.   If you develop nausea and vomiting that is not controlled by your nausea medication, call the clinic.   BELOW ARE SYMPTOMS THAT SHOULD BE REPORTED IMMEDIATELY:  *FEVER GREATER THAN 100.5 F  *CHILLS WITH OR WITHOUT FEVER  NAUSEA AND VOMITING THAT IS NOT CONTROLLED WITH YOUR NAUSEA MEDICATION  *UNUSUAL SHORTNESS OF BREATH  *UNUSUAL BRUISING OR BLEEDING  TENDERNESS IN MOUTH AND THROAT WITH OR WITHOUT PRESENCE OF ULCERS  *URINARY PROBLEMS  *BOWEL PROBLEMS  UNUSUAL RASH Items with * indicate a potential emergency and should be followed up as soon as possible.  Feel free to call the clinic you have any questions or concerns. The clinic phone number is [phone].  Please show the CHEMO ALERT CARD at check-in to the Emergency Department and triage nurse.   

## 2015-07-18 ENCOUNTER — Ambulatory Visit (HOSPITAL_COMMUNITY)
Admission: RE | Admit: 2015-07-18 | Discharge: 2015-07-18 | Disposition: A | Discharge status: Home / Self Care | Payer: BLUE CROSS/BLUE SHIELD | Source: Ambulatory Visit | Attending: Hematology | Admitting: Hematology

## 2015-07-18 DIAGNOSIS — C787 Secondary malignant neoplasm of liver and intrahepatic bile duct: Secondary | ICD-10-CM | POA: Diagnosis present

## 2015-07-18 DIAGNOSIS — I700 Atherosclerosis of aorta: Secondary | ICD-10-CM | POA: Insufficient documentation

## 2015-07-18 DIAGNOSIS — I2510 Atherosclerotic heart disease of native coronary artery without angina pectoris: Secondary | ICD-10-CM | POA: Diagnosis not present

## 2015-07-18 DIAGNOSIS — C189 Malignant neoplasm of colon, unspecified: Secondary | ICD-10-CM | POA: Diagnosis present

## 2015-07-18 MED ORDER — IOPAMIDOL (ISOVUE-300) INJECTION 61%
100.0000 mL | Freq: Once | INTRAVENOUS | Status: AC | PRN
  Administered 2015-07-18: 100 mL via INTRAVENOUS

## 2015-07-18 MED ORDER — DIATRIZOATE MEGLUMINE & SODIUM 66-10 % PO SOLN
30.0000 mL | Freq: Once | ORAL | Status: AC
  Administered 2015-07-18: 30 mL via ORAL

## 2015-07-25 ENCOUNTER — Encounter: Payer: Self-pay | Admitting: Hematology

## 2015-07-25 ENCOUNTER — Ambulatory Visit (HOSPITAL_BASED_OUTPATIENT_CLINIC_OR_DEPARTMENT_OTHER): Payer: BLUE CROSS/BLUE SHIELD

## 2015-07-25 ENCOUNTER — Other Ambulatory Visit (HOSPITAL_BASED_OUTPATIENT_CLINIC_OR_DEPARTMENT_OTHER): Payer: BLUE CROSS/BLUE SHIELD

## 2015-07-25 ENCOUNTER — Other Ambulatory Visit: Payer: BLUE CROSS/BLUE SHIELD

## 2015-07-25 ENCOUNTER — Telehealth: Payer: Self-pay | Admitting: Hematology

## 2015-07-25 ENCOUNTER — Ambulatory Visit (HOSPITAL_BASED_OUTPATIENT_CLINIC_OR_DEPARTMENT_OTHER): Payer: BLUE CROSS/BLUE SHIELD | Admitting: Hematology

## 2015-07-25 VITALS — BP 154/82

## 2015-07-25 VITALS — BP 149/68 | HR 95 | Temp 98.2°F | Resp 18 | Ht 64.0 in | Wt 159.0 lb

## 2015-07-25 DIAGNOSIS — D6959 Other secondary thrombocytopenia: Secondary | ICD-10-CM | POA: Diagnosis not present

## 2015-07-25 DIAGNOSIS — Z452 Encounter for adjustment and management of vascular access device: Secondary | ICD-10-CM | POA: Diagnosis not present

## 2015-07-25 DIAGNOSIS — C787 Secondary malignant neoplasm of liver and intrahepatic bile duct: Secondary | ICD-10-CM | POA: Diagnosis not present

## 2015-07-25 DIAGNOSIS — C189 Malignant neoplasm of colon, unspecified: Secondary | ICD-10-CM

## 2015-07-25 DIAGNOSIS — Z5112 Encounter for antineoplastic immunotherapy: Secondary | ICD-10-CM

## 2015-07-25 DIAGNOSIS — E119 Type 2 diabetes mellitus without complications: Secondary | ICD-10-CM

## 2015-07-25 DIAGNOSIS — N179 Acute kidney failure, unspecified: Secondary | ICD-10-CM

## 2015-07-25 DIAGNOSIS — I10 Essential (primary) hypertension: Secondary | ICD-10-CM

## 2015-07-25 DIAGNOSIS — C187 Malignant neoplasm of sigmoid colon: Secondary | ICD-10-CM

## 2015-07-25 DIAGNOSIS — T451X5A Adverse effect of antineoplastic and immunosuppressive drugs, initial encounter: Secondary | ICD-10-CM

## 2015-07-25 DIAGNOSIS — D630 Anemia in neoplastic disease: Secondary | ICD-10-CM | POA: Diagnosis not present

## 2015-07-25 DIAGNOSIS — Z5111 Encounter for antineoplastic chemotherapy: Secondary | ICD-10-CM | POA: Diagnosis not present

## 2015-07-25 DIAGNOSIS — Z95828 Presence of other vascular implants and grafts: Secondary | ICD-10-CM

## 2015-07-25 LAB — CBC WITH DIFFERENTIAL/PLATELET
BASO%: 0.9 % (ref 0.0–2.0)
Basophils Absolute: 0 10*3/uL (ref 0.0–0.1)
EOS%: 4.7 % (ref 0.0–7.0)
Eosinophils Absolute: 0.2 10*3/uL (ref 0.0–0.5)
HEMATOCRIT: 29.2 % — AB (ref 34.8–46.6)
HEMOGLOBIN: 9.3 g/dL — AB (ref 11.6–15.9)
LYMPH#: 0.8 10*3/uL — AB (ref 0.9–3.3)
LYMPH%: 19 % (ref 14.0–49.7)
MCH: 31.3 pg (ref 25.1–34.0)
MCHC: 31.8 g/dL (ref 31.5–36.0)
MCV: 98.3 fL (ref 79.5–101.0)
MONO#: 0.7 10*3/uL (ref 0.1–0.9)
MONO%: 17.4 % — ABNORMAL HIGH (ref 0.0–14.0)
NEUT%: 58 % (ref 38.4–76.8)
NEUTROS ABS: 2.5 10*3/uL (ref 1.5–6.5)
PLATELETS: 102 10*3/uL — AB (ref 145–400)
RBC: 2.97 10*6/uL — ABNORMAL LOW (ref 3.70–5.45)
RDW: 17.7 % — AB (ref 11.2–14.5)
WBC: 4.2 10*3/uL (ref 3.9–10.3)

## 2015-07-25 LAB — COMPREHENSIVE METABOLIC PANEL
ALBUMIN: 3.4 g/dL — AB (ref 3.5–5.0)
ALK PHOS: 113 U/L (ref 40–150)
ALT: 37 U/L (ref 0–55)
ANION GAP: 9 meq/L (ref 3–11)
AST: 38 U/L — ABNORMAL HIGH (ref 5–34)
BUN: 22.7 mg/dL (ref 7.0–26.0)
CO2: 24 mEq/L (ref 22–29)
Calcium: 9.4 mg/dL (ref 8.4–10.4)
Chloride: 109 mEq/L (ref 98–109)
Creatinine: 1.3 mg/dL — ABNORMAL HIGH (ref 0.6–1.1)
EGFR: 51 mL/min/{1.73_m2} — AB (ref >90)
Glucose: 136 mg/dl (ref 70–140)
Potassium: 4.7 mEq/L (ref 3.5–5.1)
Sodium: 142 mEq/L (ref 136–145)
TOTAL PROTEIN: 7.2 g/dL (ref 6.4–8.3)

## 2015-07-25 MED ORDER — SODIUM CHLORIDE 0.9 % IV SOLN
Freq: Once | INTRAVENOUS | Status: AC
  Administered 2015-07-25: 10:00:00 via INTRAVENOUS

## 2015-07-25 MED ORDER — SODIUM CHLORIDE 0.9 % IV SOLN
2200.0000 mg/m2 | INTRAVENOUS | Status: DC
  Administered 2015-07-25: 3950 mg via INTRAVENOUS
  Filled 2015-07-25: qty 79

## 2015-07-25 MED ORDER — DEXTROSE 5 % IV SOLN
400.0000 mg/m2 | Freq: Once | INTRAVENOUS | Status: AC
  Administered 2015-07-25: 716 mg via INTRAVENOUS
  Filled 2015-07-25: qty 35.8

## 2015-07-25 MED ORDER — OXALIPLATIN CHEMO INJECTION 100 MG/20ML
70.0000 mg/m2 | Freq: Once | INTRAVENOUS | Status: AC
  Administered 2015-07-25: 125 mg via INTRAVENOUS
  Filled 2015-07-25: qty 5

## 2015-07-25 MED ORDER — SODIUM CHLORIDE 0.9 % IV SOLN
5.0000 mg/kg | Freq: Once | INTRAVENOUS | Status: AC
  Administered 2015-07-25: 350 mg via INTRAVENOUS
  Filled 2015-07-25: qty 14

## 2015-07-25 MED ORDER — SODIUM CHLORIDE 0.9 % IJ SOLN
10.0000 mL | INTRAMUSCULAR | Status: DC | PRN
  Filled 2015-07-25: qty 10

## 2015-07-25 MED ORDER — SODIUM CHLORIDE 0.9 % IV SOLN
Freq: Once | INTRAVENOUS | Status: AC
  Administered 2015-07-25: 10:00:00 via INTRAVENOUS
  Filled 2015-07-25: qty 4

## 2015-07-25 MED ORDER — HEPARIN SOD (PORK) LOCK FLUSH 100 UNIT/ML IV SOLN
500.0000 [IU] | Freq: Once | INTRAVENOUS | Status: DC | PRN
  Filled 2015-07-25: qty 5

## 2015-07-25 MED ORDER — DEXTROSE 5 % IV SOLN
Freq: Once | INTRAVENOUS | Status: AC
  Administered 2015-07-25: 11:00:00 via INTRAVENOUS

## 2015-07-25 MED ORDER — SODIUM CHLORIDE 0.9 % IJ SOLN
10.0000 mL | INTRAMUSCULAR | Status: DC | PRN
  Administered 2015-07-25: 10 mL via INTRAVENOUS
  Filled 2015-07-25: qty 10

## 2015-07-25 NOTE — Patient Instructions (Signed)
[facility] Discharge Instructions for Patients Receiving Chemotherapy  Today you received the following chemotherapy agents: Avastin, Oxaliplatin, Leucovorin and 5FU. To help prevent nausea and vomiting after your treatment, we encourage you to take your nausea medication: Zofran 8 mg every 8 hours as needed.   If you develop nausea and vomiting that is not controlled by your nausea medication, call the clinic.   BELOW ARE SYMPTOMS THAT SHOULD BE REPORTED IMMEDIATELY:  *FEVER GREATER THAN 100.5 F  *CHILLS WITH OR WITHOUT FEVER  NAUSEA AND VOMITING THAT IS NOT CONTROLLED WITH YOUR NAUSEA MEDICATION  *UNUSUAL SHORTNESS OF BREATH  *UNUSUAL BRUISING OR BLEEDING  TENDERNESS IN MOUTH AND THROAT WITH OR WITHOUT PRESENCE OF ULCERS  *URINARY PROBLEMS  *BOWEL PROBLEMS  UNUSUAL RASH Items with * indicate a potential emergency and should be followed up as soon as possible.  Feel free to call the clinic you have any questions or concerns. The clinic phone number is [phone].  Please show the [place] at check-in to the Emergency Department and triage nurse.

## 2015-07-25 NOTE — Telephone Encounter (Signed)
Gave and printed appt sched and avs for pt for July Thru Aug

## 2015-07-25 NOTE — Progress Notes (Signed)
[facility]  [phone]) [phone] [phone]  Clinic Follow Up Note   Patient Care Team: Dione Housekeeper, MD as PCP - General (Family Medicine) Tania Ade, RN as Registered Nurse (Oncology) 07/25/2015  CHIEF COMPLAINTS:  Follow-up of metastatic colon cancer  Oncology History   Metastatic colon cancer to liver   Staging form: Colon and Rectum, AJCC 7th Edition     Clinical: Stage Unknown ([place], NX, M1) - Unsigned        Metastatic colon cancer to liver ([place])   07/14/2014 Imaging CT abdomen and pelvis with contrast showed a 6 cm segment of sigmoid bowel wall thickening with adjacent 14 x 12 mm nodules, diffuse hepatic metastasis. No bowel obstruction.   07/14/2014 Initial Diagnosis Metastatic colon cancer to liver   07/25/2014 Procedure Colonoscopy showed a near circumferential medial mass in the sigmoid colon, partially obstructing consistent with carcinoma.   07/25/2014 Initial Biopsy Sigmoid: Biopsy showed invasive adenocarcinoma arising in a background of high-grade dysplasia.   07/28/2014 Imaging CT chest showed no evidence of metastasis   08/09/2014 -  Chemotherapy mFOLFOX6 every 2 weeks, dose reduction from cycle 7 due to cytopenia, Avastin added from cycle 3   08/11/2014 - 08/15/2014 Hospital Admission Pt was admitted for fever and nausea, treated for UTI, liver biopsy cancelled due to the infection     HISTORY OF PRESENTING ILLNESS:  Michelle Erickson 60 y.o. female is here because of abnoaml CT findings which is highly suspecious for metastatic colon cancer.   She presented intermittent nausea, anorexia and abdominal pain since Nov 2015, she has mild pain in the LUQ, crapy pain, positional (bending over),   it happened once every 2-3 weeks, and it has been more frequent in the past month. She has normal BM, but did notice the caliber of the stool is smaller than before. She denies any melena or hematochezia. She lost  about 13 lbs in the past 6 months.   She presents to local emergency room in November 2015, and May 2016, was felt to be related to gastric virus. Due to the worsening symptoms lately, she went to [facility] ED on 07/13/2014, CT abdomen was obtained which showed multiple large liver metastasis and probable sigmoid colon mass. She was referred to [place] for further workup. She is scheduled to see GI Dr. Olevia Perches this afternoon.  CURRENT THERAPY: mFOLFOX6 and Avastin every 2 weeks, started on 08/09/2014   INTERIM HISTORY Michelle Erickson returns for follow-up and chemo. She is accompanied by her husband to the clinic today. She is doing very well overall,she has intermittent right shouder pain, improves with exercise. No limited ROM. No nausea, diarrhea or other complaints. She has good appetite and energy level, doing well overall.  MEDICAL HISTORY Past Medical History  Diagnosis Date  . Hyperlipemia   . Hypertension   . Wears glasses     contacts/glasses  . Snoring   . Back pain   . Family history of colon cancer   . Diabetes mellitus without complication ([place])   . Metastatic colon cancer to liver ([place]) 07/21/2014    SURGICAL HISTORY: Past Surgical History  Procedure Laterality Date  . Mouth surgery    . Cataract extraction Bilateral   . Portacath placement N/A 08/03/2014    Procedure: INSERTION PORT-A-CATH;  Surgeon: Stark Klein, MD;  Location: [place];  Service: General;  Laterality: N/A;    SOCIAL HISTORY: Social History   Social History  . Marital Status: Married  Spouse Name: Michelle Erickson  . Number of Children: 2  . Years of Education: N/A   Occupational History  . Not on file.   Social History Main Topics  . Smoking status: Former Smoker -- 1.00 packs/day for 6 years    Types: Cigarettes    Quit date: 01/21/1976  . Smokeless tobacco: Not on file  . Alcohol Use: No  . Drug Use: No  . Sexual Activity: Yes    Birth Control/ Protection: Post-menopausal     Comment: # 2  pregnancies and #2 live births   Other Topics Concern  . Not on file   Social History Narrative   Married, husband [name]   Has #2 adult children   Previously worked at [employer] HISTORY: Family History  Problem Relation Age of Onset  . COPD Father   . Heart attack Father   . Arthritis Sister   . Diabetes Sister   . Colon cancer Sister     dx over 9  . Colon polyps Sister   . Seizures Brother   . Colon cancer Sister     dx in her early 23s, died in her early to mid 43s  . Diabetes Mother     ALLERGIES:  has No Known Allergies.  MEDICATIONS:  Current Outpatient Prescriptions  Medication Sig Dispense Refill  . amLODipine (NORVASC) 5 MG tablet Take 2 tablets (10 mg total) by mouth daily. 30 tablet 0  . atorvastatin (LIPITOR) 40 MG tablet Take 40 mg by mouth daily.    . cetirizine (ZYRTEC) 10 MG tablet Take 10 mg by mouth daily as needed for allergies. Reported on 05/02/2015    . ferrous sulfate 325 (65 FE) MG tablet Take 325 mg by mouth daily with breakfast.    . Insulin Glargine (LANTUS SOLOSTAR) 100 UNIT/ML Solostar Pen Inject 23 Units into the skin daily at 10 pm.    . insulin lispro (HUMALOG) 100 UNIT/ML injection Inject 4-8 Units into the skin 2 (two) times daily as needed for high blood sugar. Sliding scale    . lidocaine-prilocaine (EMLA) cream     . ondansetron (ZOFRAN-ODT) 8 MG disintegrating tablet Take 8 mg by mouth every 8 (eight) hours as needed for nausea or vomiting. Reported on 05/02/2015     No current facility-administered medications for this visit.   Facility-Administered Medications Ordered in Other Visits  Medication Dose Route Frequency Provider Last Rate Last Dose  . bevacizumab (AVASTIN) 350 mg in sodium chloride 0.9 % 100 mL chemo infusion  5 mg/kg (Treatment Plan Actual) Intravenous Once Truitt Merle, MD      . dextrose 5 % solution   Intravenous Once Truitt Merle, MD      . fluorouracil (ADRUCIL) 3,950 mg in sodium chloride 0.9 % 71 mL  chemo infusion  2,200 mg/m2 (Treatment Plan Actual) Intravenous 1 day or 1 dose Truitt Merle, MD      . heparin lock flush 100 unit/mL  500 Units Intracatheter Once PRN Truitt Merle, MD      . leucovorin 716 mg in dextrose 5 % 250 mL infusion  400 mg/m2 (Treatment Plan Actual) Intravenous Once Truitt Merle, MD      . ondansetron (ZOFRAN) 8 mg, dexamethasone (DECADRON) 10 mg in sodium chloride 0.9 % 50 mL IVPB   Intravenous Once Truitt Merle, MD      . oxaliplatin (ELOXATIN) 125 mg in dextrose 5 % 500 mL chemo infusion  70 mg/m2 (Treatment Plan Actual)  Intravenous Once Truitt Merle, MD      . sodium chloride 0.9 % injection 10 mL  10 mL Intracatheter PRN Truitt Merle, MD        REVIEW OF SYSTEMS:   Constitutional: Denies fevers, chills or abnormal night sweats Eyes: Denies blurriness of vision, double vision or watery eyes Ears, nose, mouth, throat, and face: Denies mucositis or sore throat Respiratory: Denies cough, dyspnea or wheezes Cardiovascular: Denies palpitation, chest discomfort or lower extremity swelling Gastrointestinal:  Denies nausea, heartburn or change in bowel habits Skin: Denies abnormal skin rashes Lymphatics: Denies new lymphadenopathy or easy bruising Neurological:Denies numbness, tingling or new weaknesses Behavioral/Psych: Mood is stable, no new changes  All other systems were reviewed with the patient and are negative.  PHYSICAL EXAMINATION: ECOG PERFORMANCE STATUS: 0  BP 149/68 mmHg  Pulse 95  Temp(Src) 98.2 F (36.8 C) (Oral)  Resp 18  Ht _0  (1.626 m)  Wt 159 lb (72.122 kg)  BMI 27.28 kg/m2  SpO2 100% GENERAL:alert, no distress and comfortable SKIN: skin color, texture, turgor are normal, no rashes or significant lesions EYES: normal, conjunctiva are pink and non-injected, sclera clear OROPHARYNX:no exudate, no erythema and lips, buccal mucosa, and tongue normal  NECK: supple, thyroid normal size, non-tender, without nodularity LYMPH:  no palpable lymphadenopathy in the  cervical, axillary or inguinal LUNGS: clear to auscultation and percussion with normal breathing effort HEART: regular rate & rhythm and no murmurs and no lower extremity edema ABDOMEN:abdomen soft, non-tender and normal bowel sounds Musculoskeletal:no cyanosis of digits and no clubbing  PSYCH: alert & oriented x 3 with fluent speech NEURO: no focal motor/sensory deficits  LABORATORY DATA:  I have reviewed the data as listed CBC Latest Ref Rng 07/25/2015 07/11/2015 06/27/2015  WBC 3.9 - 10.3 10e3/uL 4.2 4.8 4.9  Hemoglobin 11.6 - 15.9 g/dL 9.3(L) 9.5(L) 9.2(L)  Hematocrit 34.8 - 46.6 % 29.2(L) 29.2(L) 28.6(L)  Platelets 145 - 400 10e3/uL 102(L) 103(L) 118(L)   CMP Latest Ref Rng 07/25/2015 07/11/2015 06/27/2015  Glucose 70 - 140 mg/dl 136 126 217(H)  BUN 7.0 - 26.0 mg/dL 22.7 17.6 19.2  Creatinine 0.6 - 1.1 mg/dL 1.3(H) 1.3(H) 1.3(H)  Sodium 136 - 145 mEq/L 142 139 138  Potassium 3.5 - 5.1 mEq/L 4.7 4.4 4.7  CO2 22 - 29 mEq/L _1 Calcium 8.4 - 10.4 mg/dL 9.4 9.4 9.2  Total Protein 6.4 - 8.3 g/dL 7.2 7.1 7.0  Total Bilirubin 0.20 - 1.20 mg/dL <0.30 <0.30 <0.30  Alkaline Phos 40 - 150 U/L 113 103 103  AST 5 - 34 U/L 38(H) 30 28  ALT 0 - 55 U/L 37 35 34   Results for ADALIA, PETTIS (MRN [id number]) as of 07/25/2015 08:30  Ref. Range 02/07/2015 08:19 03/07/2015 08:20 04/04/2015 09:58 05/02/2015 10:58 05/30/2015 08:22 06/27/2015 08:10  CEA Latest Units: ng/mL 6.0 (H) 6.6      CEA Latest Ref Range: 0.0-4.7 ng/mL 16.0 (H) 17.7 (H) 18.3 (H) 20.8 (H) 21.4 (H) 24.7 (H)    PATHOLOGY REPORTS: Diagnosis 07/25/2014  Surgical [P], sigmoid - INVASIVE ADENOCARCINOMA ARISING IN A BACKGROUND OF HIGH GRADE DYSPLASIA. - SEE COMMENT. Microscopic Comment Results are phoned to Dr. Olevia Perches. Internal departmental review obtained (Dr. Donato Heinz) with agreement.  Diagnosis 09/05/2014 Liver, biopsy - METASTATIC ADENOCARCINOMA, SEE COMMENT. Microscopic Comment Given the patient's history of sigmoid invasive  adenocarcinoma and the morphology, the findings are consistent with metastatic colorectal adenocarcinoma. The case was called to Dr. Burr Medico. Per request, MMR, MSI, and  KRAS testing will be ordered.  KRAS MUTATION (+) c36G>A (p. G12D)  RADIOGRAPHIC STUDIES: I have personally reviewed the radiological images as listed and agreed with the findings in the report.   CT chest, abdomen and pelvis with contrast 07/18/2015 IMPRESSION: 1. Multiple liver metastases are again noted. These are stable to minimally increased in size compared with previous exam. 2. Soft tissue nodule adjacent to the sigmoid colon is stable compared with previous exam. 3. Aortic atherosclerosis and coronary artery disease.    Colonoscopy 07/25/2014 Dr. Olevia Perches  ENDOSCOPIC IMPRESSION: Near circumferential medium mass was found in the sigmoid colon; multiple biopsies were performed using cold forceps, partially obstructing mass consistent with carcinoma. Placement of endoclips and tattoo at the margins of the mass which extends from 18-25 cm from the anus  ASSESSMENT & PLAN: 60 year old African-American female, presented with intermittent nausea vomiting and anemia.   1. Sigmoid colon cancer with liver metastasis, TxNxM1, stage IV, MSI stable, KRAS mutation(+) -I previously reviewed her CT scans, colonoscopy, and colon mass biopsy findings with patient and her husband in great details, images were reviewed in person  -I reviewed her liver biopsy results, which confirmed metastasis from colon cancer. -The natural history of metastatic colon cancer and treatment options were discussed with patient and her husband. Giving the diffuse metastasis in the liver, this is unfortunately incurable disease. I recommend systemic chemotherapy to control her metastatic disease, the goal of therapy is palliative and to prolong her life. -she is on first-line chemotherapy FOLFOX and Avastin every 2 weeks, tolerarting well. -her tumor MSI is  stable, KRAS mutated, not a candidate for immunotherapy or EGFR inhibitor. -Her CEA has dropped significantly. I reviewed her restaging CT scan from 07/18/2015 which showed stable disease overall, slightly increased of a few liver lesions.  -I recommend her to continue current first-line chemotherapy given the stable disease overall an excellent tolerance. We discussed she might develop resistance to the chemotherapy in the near future, and I would consider switching to second line treatment. We'll continue follow-up her CEA monthly.  - Today's lab results were reviewed with her, adequate for treatment. 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 -Her ferritin is normal, serum iron and saturation slightly low, TIBC low normal, I suggest her to take oral iron supplement, she tolerates well, we'll continue once daily, with orange juice or vitamin C  -will repeat her iron study 0n 06/13/2015 showed adequate iron level  3. DM, HTN -Continue follow-up with PCP - we discussed the potential impact of chemotherapy and premedication dexamethasone on her blood pressure and sugar,will  Monitor closely. Her BG has been well controlled lately  -Her blood pressure has been high lately, we'll repeat in the infusion room. I encouraged her to measure her blood pressure at home. We discussed Avastin can increase her blood pressure, need to be monitored closely. If SBP persistently high than 150, I'll consider adding on a new antihypertensive medication. She will follow-up with her primary care physician also.  4. AKI -Cr has been around 1.1-1.3  will continue monitoring renal function  -I encourage her to drink more fluids  -IV fluids as needed  5. Mild thrombocytopenia -Secondary to chemotherapy. -will monitor  closely.  PLan -Restaging scan reviewed, overall stable disease, we'll continue chemotherapy -Lab reviewed, adequate for treatment. Continue  FOLFOX plus Avastin today and every 2 weeks, no Neulasta -I'll see her back in 4 weeks with a restaging CT scans 1 week before  All questions were answered. The patient knows to call the clinic with any problems, questions or concerns.  I spent 20 minutes counseling the patient face to face. The total time spent in the appointment was 25 minutes and more than 50% was on counseling.     Truitt Merle, MD 07/25/2015

## 2015-07-25 NOTE — Patient Instructions (Signed)

## 2015-07-27 ENCOUNTER — Ambulatory Visit (HOSPITAL_BASED_OUTPATIENT_CLINIC_OR_DEPARTMENT_OTHER): Payer: BLUE CROSS/BLUE SHIELD

## 2015-07-27 DIAGNOSIS — Z452 Encounter for adjustment and management of vascular access device: Secondary | ICD-10-CM

## 2015-07-27 DIAGNOSIS — C187 Malignant neoplasm of sigmoid colon: Secondary | ICD-10-CM | POA: Diagnosis not present

## 2015-07-27 DIAGNOSIS — C787 Secondary malignant neoplasm of liver and intrahepatic bile duct: Principal | ICD-10-CM

## 2015-07-27 DIAGNOSIS — C189 Malignant neoplasm of colon, unspecified: Secondary | ICD-10-CM

## 2015-07-27 MED ORDER — SODIUM CHLORIDE 0.9 % IJ SOLN
10.0000 mL | INTRAMUSCULAR | Status: DC | PRN
  Administered 2015-07-27: 10 mL
  Filled 2015-07-27: qty 10

## 2015-07-27 MED ORDER — HEPARIN SOD (PORK) LOCK FLUSH 100 UNIT/ML IV SOLN
500.0000 [IU] | Freq: Once | INTRAVENOUS | Status: AC | PRN
  Administered 2015-07-27: 500 [IU]
  Filled 2015-07-27: qty 5

## 2015-07-27 NOTE — Patient Instructions (Signed)

## 2015-08-08 ENCOUNTER — Other Ambulatory Visit: Payer: Self-pay | Admitting: Hematology

## 2015-08-08 ENCOUNTER — Other Ambulatory Visit (HOSPITAL_BASED_OUTPATIENT_CLINIC_OR_DEPARTMENT_OTHER): Payer: BLUE CROSS/BLUE SHIELD

## 2015-08-08 ENCOUNTER — Ambulatory Visit (HOSPITAL_BASED_OUTPATIENT_CLINIC_OR_DEPARTMENT_OTHER): Payer: BLUE CROSS/BLUE SHIELD

## 2015-08-08 ENCOUNTER — Ambulatory Visit: Payer: BLUE CROSS/BLUE SHIELD

## 2015-08-08 VITALS — BP 176/81 | HR 92 | Temp 97.1°F | Resp 18

## 2015-08-08 DIAGNOSIS — C787 Secondary malignant neoplasm of liver and intrahepatic bile duct: Secondary | ICD-10-CM

## 2015-08-08 DIAGNOSIS — C187 Malignant neoplasm of sigmoid colon: Secondary | ICD-10-CM | POA: Diagnosis not present

## 2015-08-08 DIAGNOSIS — C189 Malignant neoplasm of colon, unspecified: Secondary | ICD-10-CM

## 2015-08-08 DIAGNOSIS — Z452 Encounter for adjustment and management of vascular access device: Secondary | ICD-10-CM

## 2015-08-08 DIAGNOSIS — Z95828 Presence of other vascular implants and grafts: Secondary | ICD-10-CM

## 2015-08-08 LAB — CBC WITH DIFFERENTIAL/PLATELET
BASO%: 0.7 % (ref 0.0–2.0)
Basophils Absolute: 0 10*3/uL (ref 0.0–0.1)
EOS ABS: 0.2 10*3/uL (ref 0.0–0.5)
EOS%: 5 % (ref 0.0–7.0)
HCT: 29.9 % — ABNORMAL LOW (ref 34.8–46.6)
HGB: 9.6 g/dL — ABNORMAL LOW (ref 11.6–15.9)
LYMPH%: 21.1 % (ref 14.0–49.7)
MCH: 31.3 pg (ref 25.1–34.0)
MCHC: 32.2 g/dL (ref 31.5–36.0)
MCV: 97.3 fL (ref 79.5–101.0)
MONO#: 0.7 10*3/uL (ref 0.1–0.9)
MONO%: 15.2 % — AB (ref 0.0–14.0)
NEUT%: 58 % (ref 38.4–76.8)
NEUTROS ABS: 2.7 10*3/uL (ref 1.5–6.5)
Platelets: 102 10*3/uL — ABNORMAL LOW (ref 145–400)
RBC: 3.07 10*6/uL — AB (ref 3.70–5.45)
RDW: 17.1 % — ABNORMAL HIGH (ref 11.2–14.5)
WBC: 4.6 10*3/uL (ref 3.9–10.3)
lymph#: 1 10*3/uL (ref 0.9–3.3)

## 2015-08-08 LAB — COMPREHENSIVE METABOLIC PANEL
ALT: 29 U/L (ref 0–55)
AST: 30 U/L (ref 5–34)
Albumin: 3.3 g/dL — ABNORMAL LOW (ref 3.5–5.0)
Alkaline Phosphatase: 115 U/L (ref 40–150)
Anion Gap: 9 mEq/L (ref 3–11)
BUN: 15.3 mg/dL (ref 7.0–26.0)
CHLORIDE: 108 meq/L (ref 98–109)
CO2: 26 meq/L (ref 22–29)
Calcium: 9.7 mg/dL (ref 8.4–10.4)
Creatinine: 1.1 mg/dL (ref 0.6–1.1)
EGFR: 59 mL/min/{1.73_m2} — AB (ref >90)
GLUCOSE: 71 mg/dL (ref 70–140)
Potassium: 4.1 mEq/L (ref 3.5–5.1)
SODIUM: 143 meq/L (ref 136–145)
TOTAL PROTEIN: 7.2 g/dL (ref 6.4–8.3)

## 2015-08-08 MED ORDER — HEPARIN SOD (PORK) LOCK FLUSH 100 UNIT/ML IV SOLN
500.0000 [IU] | Freq: Once | INTRAVENOUS | Status: AC | PRN
  Administered 2015-08-08: 500 [IU] via INTRAVENOUS
  Filled 2015-08-08: qty 5

## 2015-08-08 MED ORDER — SODIUM CHLORIDE 0.9 % IJ SOLN
10.0000 mL | INTRAMUSCULAR | Status: DC | PRN
  Administered 2015-08-08: 10 mL via INTRAVENOUS
  Filled 2015-08-08: qty 10

## 2015-08-08 NOTE — Progress Notes (Signed)
Patient left accessed per Dr. Burr Medico. Pt was to be scheduled for an infusion today. She states she will address this to add her to the schedule for today. Pt agrees with plan.

## 2015-08-08 NOTE — Patient Instructions (Signed)

## 2015-08-08 NOTE — Progress Notes (Signed)
Pt states that she would like to skip this treatment due to scheduling issues. Dr. Burr Medico aware and over to infusion to see pt. Dr. Burr Medico and Pt in agreement to skip this treatment and resume 08/21/15 at next scheduled treatment. Dr. Burr Medico would like to see pt on 09/05/15 prior to Infusion appointment, per Dr. Burr Medico POF has been sent to schedule an office visit.

## 2015-08-10 ENCOUNTER — Ambulatory Visit: Payer: BLUE CROSS/BLUE SHIELD

## 2015-08-10 NOTE — Progress Notes (Signed)
Pt didn't come in today for pump d'c due to infusion skip on 7/19.

## 2015-08-11 ENCOUNTER — Telehealth: Payer: Self-pay | Admitting: Hematology

## 2015-08-11 NOTE — Telephone Encounter (Signed)
S/w pt, advised appt time chgd on 8/16 from 8am to 11.15am due to md appt added per 7/19 pof. Pt verbalized understanding.

## 2015-08-17 ENCOUNTER — Other Ambulatory Visit: Payer: Self-pay | Admitting: Hematology

## 2015-08-21 ENCOUNTER — Other Ambulatory Visit: Payer: Self-pay | Admitting: *Deleted

## 2015-08-21 ENCOUNTER — Other Ambulatory Visit (HOSPITAL_BASED_OUTPATIENT_CLINIC_OR_DEPARTMENT_OTHER): Payer: BLUE CROSS/BLUE SHIELD

## 2015-08-21 ENCOUNTER — Ambulatory Visit (HOSPITAL_BASED_OUTPATIENT_CLINIC_OR_DEPARTMENT_OTHER): Payer: BLUE CROSS/BLUE SHIELD

## 2015-08-21 VITALS — BP 146/75 | HR 83 | Temp 97.0°F | Resp 18

## 2015-08-21 DIAGNOSIS — Z5111 Encounter for antineoplastic chemotherapy: Secondary | ICD-10-CM | POA: Diagnosis not present

## 2015-08-21 DIAGNOSIS — C787 Secondary malignant neoplasm of liver and intrahepatic bile duct: Secondary | ICD-10-CM

## 2015-08-21 DIAGNOSIS — C187 Malignant neoplasm of sigmoid colon: Secondary | ICD-10-CM

## 2015-08-21 DIAGNOSIS — Z5112 Encounter for antineoplastic immunotherapy: Secondary | ICD-10-CM

## 2015-08-21 DIAGNOSIS — Z95828 Presence of other vascular implants and grafts: Secondary | ICD-10-CM

## 2015-08-21 DIAGNOSIS — Z452 Encounter for adjustment and management of vascular access device: Secondary | ICD-10-CM

## 2015-08-21 DIAGNOSIS — C189 Malignant neoplasm of colon, unspecified: Secondary | ICD-10-CM

## 2015-08-21 LAB — COMPREHENSIVE METABOLIC PANEL
ALT: 27 U/L (ref 0–55)
ANION GAP: 8 meq/L (ref 3–11)
AST: 33 U/L (ref 5–34)
Albumin: 3.3 g/dL — ABNORMAL LOW (ref 3.5–5.0)
Alkaline Phosphatase: 135 U/L (ref 40–150)
BUN: 21.4 mg/dL (ref 7.0–26.0)
CALCIUM: 9.3 mg/dL (ref 8.4–10.4)
CHLORIDE: 107 meq/L (ref 98–109)
CO2: 24 mEq/L (ref 22–29)
Creatinine: 1.3 mg/dL — ABNORMAL HIGH (ref 0.6–1.1)
EGFR: 49 mL/min/{1.73_m2} — ABNORMAL LOW (ref >90)
Glucose: 131 mg/dl (ref 70–140)
POTASSIUM: 4.7 meq/L (ref 3.5–5.1)
Sodium: 140 mEq/L (ref 136–145)
Total Bilirubin: <0.3 mg/dL (ref 0.20–1.20)
Total Protein: 6.8 g/dL (ref 6.4–8.3)

## 2015-08-21 LAB — CBC WITH DIFFERENTIAL/PLATELET
BASO%: 0.9 % (ref 0.0–2.0)
Basophils Absolute: 0.1 10*3/uL (ref 0.0–0.1)
EOS ABS: 0.3 10*3/uL (ref 0.0–0.5)
EOS%: 4.7 % (ref 0.0–7.0)
HEMATOCRIT: 30.1 % — AB (ref 34.8–46.6)
HEMOGLOBIN: 9.8 g/dL — AB (ref 11.6–15.9)
LYMPH#: 1.1 10*3/uL (ref 0.9–3.3)
LYMPH%: 18.2 % (ref 14.0–49.7)
MCH: 31.4 pg (ref 25.1–34.0)
MCHC: 32.4 g/dL (ref 31.5–36.0)
MCV: 97 fL (ref 79.5–101.0)
MONO#: 0.7 10*3/uL (ref 0.1–0.9)
MONO%: 11.7 % (ref 0.0–14.0)
NEUT%: 64.5 % (ref 38.4–76.8)
NEUTROS ABS: 4 10*3/uL (ref 1.5–6.5)
PLATELETS: 136 10*3/uL — AB (ref 145–400)
RBC: 3.11 10*6/uL — ABNORMAL LOW (ref 3.70–5.45)
RDW: 15.8 % — AB (ref 11.2–14.5)
WBC: 6.3 10*3/uL (ref 3.9–10.3)

## 2015-08-21 MED ORDER — SODIUM CHLORIDE 0.9 % IV SOLN
Freq: Once | INTRAVENOUS | Status: AC
  Administered 2015-08-21: 10:00:00 via INTRAVENOUS
  Filled 2015-08-21: qty 4

## 2015-08-21 MED ORDER — SODIUM CHLORIDE 0.9 % IV SOLN
5.0000 mg/kg | Freq: Once | INTRAVENOUS | Status: AC
  Administered 2015-08-21: 350 mg via INTRAVENOUS
  Filled 2015-08-21: qty 14

## 2015-08-21 MED ORDER — OXALIPLATIN CHEMO INJECTION 100 MG/20ML
70.0000 mg/m2 | Freq: Once | INTRAVENOUS | Status: AC
  Administered 2015-08-21: 125 mg via INTRAVENOUS
  Filled 2015-08-21: qty 20

## 2015-08-21 MED ORDER — SODIUM CHLORIDE 0.9 % IV SOLN
2200.0000 mg/m2 | INTRAVENOUS | Status: AC
  Administered 2015-08-21: 3950 mg via INTRAVENOUS
  Filled 2015-08-21: qty 79

## 2015-08-21 MED ORDER — SODIUM CHLORIDE 0.9 % IV SOLN
Freq: Once | INTRAVENOUS | Status: AC
  Administered 2015-08-21: 10:00:00 via INTRAVENOUS

## 2015-08-21 MED ORDER — LEUCOVORIN CALCIUM INJECTION 350 MG
400.0000 mg/m2 | Freq: Once | INTRAVENOUS | Status: AC
  Administered 2015-08-21: 716 mg via INTRAVENOUS
  Filled 2015-08-21: qty 35.8

## 2015-08-21 MED ORDER — SODIUM CHLORIDE 0.9 % IJ SOLN
10.0000 mL | INTRAMUSCULAR | Status: DC | PRN
  Administered 2015-08-21: 10 mL via INTRAVENOUS
  Filled 2015-08-21: qty 10

## 2015-08-21 MED ORDER — DEXTROSE 5 % IV SOLN
Freq: Once | INTRAVENOUS | Status: AC
  Administered 2015-08-21: 11:00:00 via INTRAVENOUS

## 2015-08-21 NOTE — Patient Instructions (Signed)
[facility] Discharge Instructions for Patients Receiving Chemotherapy  Today you received the following chemotherapy agents:  Avastin, Leucovorin, Oxaliplatin, Fluorouracil  To help prevent nausea and vomiting after your treatment, we encourage you to take your nausea medication as prescribed.   If you develop nausea and vomiting that is not controlled by your nausea medication, call the clinic.   BELOW ARE SYMPTOMS THAT SHOULD BE REPORTED IMMEDIATELY:  *FEVER GREATER THAN 100.5 F  *CHILLS WITH OR WITHOUT FEVER  NAUSEA AND VOMITING THAT IS NOT CONTROLLED WITH YOUR NAUSEA MEDICATION  *UNUSUAL SHORTNESS OF BREATH  *UNUSUAL BRUISING OR BLEEDING  TENDERNESS IN MOUTH AND THROAT WITH OR WITHOUT PRESENCE OF ULCERS  *URINARY PROBLEMS  *BOWEL PROBLEMS  UNUSUAL RASH Items with * indicate a potential emergency and should be followed up as soon as possible.  Feel free to call the clinic you have any questions or concerns. The clinic phone number is [phone].  Please show the CHEMO ALERT CARD at check-in to the Emergency Department and triage nurse.   

## 2015-08-21 NOTE — Patient Instructions (Signed)

## 2015-08-21 NOTE — Addendum Note (Signed)
Addended by: Paulla Dolly on: 08/21/2015 10:06 AM   Modules accepted: Orders

## 2015-08-23 ENCOUNTER — Ambulatory Visit (HOSPITAL_BASED_OUTPATIENT_CLINIC_OR_DEPARTMENT_OTHER): Payer: BLUE CROSS/BLUE SHIELD

## 2015-08-23 VITALS — BP 125/56 | HR 88 | Temp 98.3°F | Resp 18

## 2015-08-23 DIAGNOSIS — C187 Malignant neoplasm of sigmoid colon: Secondary | ICD-10-CM

## 2015-08-23 DIAGNOSIS — Z452 Encounter for adjustment and management of vascular access device: Secondary | ICD-10-CM

## 2015-08-23 DIAGNOSIS — C787 Secondary malignant neoplasm of liver and intrahepatic bile duct: Principal | ICD-10-CM

## 2015-08-23 DIAGNOSIS — C189 Malignant neoplasm of colon, unspecified: Secondary | ICD-10-CM

## 2015-08-23 MED ORDER — SODIUM CHLORIDE 0.9 % IJ SOLN
10.0000 mL | INTRAMUSCULAR | Status: DC | PRN
  Administered 2015-08-23: 10 mL
  Filled 2015-08-23: qty 10

## 2015-08-23 MED ORDER — HEPARIN SOD (PORK) LOCK FLUSH 100 UNIT/ML IV SOLN
500.0000 [IU] | Freq: Once | INTRAVENOUS | Status: AC | PRN
  Administered 2015-08-23: 500 [IU]
  Filled 2015-08-23: qty 5

## 2015-08-23 NOTE — Patient Instructions (Signed)

## 2015-09-04 ENCOUNTER — Other Ambulatory Visit: Payer: Self-pay

## 2015-09-04 DIAGNOSIS — C787 Secondary malignant neoplasm of liver and intrahepatic bile duct: Principal | ICD-10-CM

## 2015-09-04 DIAGNOSIS — C189 Malignant neoplasm of colon, unspecified: Secondary | ICD-10-CM

## 2015-09-05 ENCOUNTER — Ambulatory Visit: Payer: BLUE CROSS/BLUE SHIELD

## 2015-09-05 ENCOUNTER — Ambulatory Visit (HOSPITAL_BASED_OUTPATIENT_CLINIC_OR_DEPARTMENT_OTHER): Payer: BLUE CROSS/BLUE SHIELD | Admitting: Hematology

## 2015-09-05 ENCOUNTER — Other Ambulatory Visit: Payer: Self-pay | Admitting: *Deleted

## 2015-09-05 ENCOUNTER — Other Ambulatory Visit: Payer: BLUE CROSS/BLUE SHIELD

## 2015-09-05 ENCOUNTER — Ambulatory Visit (HOSPITAL_BASED_OUTPATIENT_CLINIC_OR_DEPARTMENT_OTHER): Payer: BLUE CROSS/BLUE SHIELD

## 2015-09-05 ENCOUNTER — Other Ambulatory Visit (HOSPITAL_BASED_OUTPATIENT_CLINIC_OR_DEPARTMENT_OTHER): Payer: BLUE CROSS/BLUE SHIELD

## 2015-09-05 ENCOUNTER — Encounter: Payer: Self-pay | Admitting: Hematology

## 2015-09-05 VITALS — BP 152/69 | HR 86 | Temp 98.4°F | Resp 18 | Ht 64.0 in | Wt 160.7 lb

## 2015-09-05 DIAGNOSIS — N179 Acute kidney failure, unspecified: Secondary | ICD-10-CM

## 2015-09-05 DIAGNOSIS — C189 Malignant neoplasm of colon, unspecified: Secondary | ICD-10-CM

## 2015-09-05 DIAGNOSIS — C787 Secondary malignant neoplasm of liver and intrahepatic bile duct: Secondary | ICD-10-CM

## 2015-09-05 DIAGNOSIS — D6959 Other secondary thrombocytopenia: Secondary | ICD-10-CM

## 2015-09-05 DIAGNOSIS — D630 Anemia in neoplastic disease: Secondary | ICD-10-CM

## 2015-09-05 DIAGNOSIS — Z95828 Presence of other vascular implants and grafts: Secondary | ICD-10-CM

## 2015-09-05 DIAGNOSIS — C187 Malignant neoplasm of sigmoid colon: Secondary | ICD-10-CM

## 2015-09-05 DIAGNOSIS — Z452 Encounter for adjustment and management of vascular access device: Secondary | ICD-10-CM | POA: Diagnosis not present

## 2015-09-05 DIAGNOSIS — I10 Essential (primary) hypertension: Secondary | ICD-10-CM

## 2015-09-05 DIAGNOSIS — E119 Type 2 diabetes mellitus without complications: Secondary | ICD-10-CM

## 2015-09-05 DIAGNOSIS — Z5111 Encounter for antineoplastic chemotherapy: Secondary | ICD-10-CM

## 2015-09-05 LAB — COMPREHENSIVE METABOLIC PANEL
ALK PHOS: 112 U/L (ref 40–150)
ALT: 23 U/L (ref 0–55)
ANION GAP: 6 meq/L (ref 3–11)
AST: 28 U/L (ref 5–34)
Albumin: 3 g/dL — ABNORMAL LOW (ref 3.5–5.0)
BUN: 20.3 mg/dL (ref 7.0–26.0)
CO2: 28 meq/L (ref 22–29)
Calcium: 9.4 mg/dL (ref 8.4–10.4)
Chloride: 107 mEq/L (ref 98–109)
Creatinine: 1.2 mg/dL — ABNORMAL HIGH (ref 0.6–1.1)
EGFR: 55 mL/min/{1.73_m2} — AB (ref >90)
Glucose: 98 mg/dl (ref 70–140)
Potassium: 4.1 mEq/L (ref 3.5–5.1)
Sodium: 142 mEq/L (ref 136–145)
TOTAL PROTEIN: 6.9 g/dL (ref 6.4–8.3)

## 2015-09-05 LAB — UA PROTEIN, DIPSTICK - CHCC: PROTEIN: 300 mg/dL

## 2015-09-05 LAB — CBC WITH DIFFERENTIAL/PLATELET
BASO%: 0.5 % (ref 0.0–2.0)
BASOS ABS: 0 10*3/uL (ref 0.0–0.1)
EOS ABS: 0.1 10*3/uL (ref 0.0–0.5)
EOS%: 3.1 % (ref 0.0–7.0)
HCT: 28.7 % — ABNORMAL LOW (ref 34.8–46.6)
HGB: 9.2 g/dL — ABNORMAL LOW (ref 11.6–15.9)
LYMPH%: 21.7 % (ref 14.0–49.7)
MCH: 30.7 pg (ref 25.1–34.0)
MCHC: 32.1 g/dL (ref 31.5–36.0)
MCV: 95.7 fL (ref 79.5–101.0)
MONO#: 0.6 10*3/uL (ref 0.1–0.9)
MONO%: 14.6 % — ABNORMAL HIGH (ref 0.0–14.0)
NEUT%: 60.1 % (ref 38.4–76.8)
NEUTROS ABS: 2.4 10*3/uL (ref 1.5–6.5)
PLATELETS: 97 10*3/uL — AB (ref 145–400)
RBC: 3 10*6/uL — AB (ref 3.70–5.45)
RDW: 15.4 % — ABNORMAL HIGH (ref 11.2–14.5)
WBC: 3.9 10*3/uL (ref 3.9–10.3)
lymph#: 0.9 10*3/uL (ref 0.9–3.3)

## 2015-09-05 LAB — IRON AND TIBC
%SAT: 20 % — AB (ref 21–57)
IRON: 69 ug/dL (ref 41–142)
TIBC: 341 ug/dL (ref 236–444)
UIBC: 272 ug/dL (ref 120–384)

## 2015-09-05 LAB — FERRITIN: Ferritin: 218 ng/ml (ref 9–269)

## 2015-09-05 MED ORDER — SODIUM CHLORIDE 0.9 % IV SOLN
Freq: Once | INTRAVENOUS | Status: AC
  Administered 2015-09-05: 14:00:00 via INTRAVENOUS
  Filled 2015-09-05: qty 4

## 2015-09-05 MED ORDER — LEUCOVORIN CALCIUM INJECTION 350 MG
400.0000 mg/m2 | Freq: Once | INTRAVENOUS | Status: AC
  Administered 2015-09-05: 716 mg via INTRAVENOUS
  Filled 2015-09-05: qty 35.8

## 2015-09-05 MED ORDER — DEXTROSE 5 % IV SOLN
Freq: Once | INTRAVENOUS | Status: AC
  Administered 2015-09-05: 15:00:00 via INTRAVENOUS

## 2015-09-05 MED ORDER — SODIUM CHLORIDE 0.9 % IV SOLN: 5.0000 mg/kg | Freq: Once | INTRAVENOUS | Status: DC

## 2015-09-05 MED ORDER — SODIUM CHLORIDE 0.9 % IJ SOLN
10.0000 mL | INTRAMUSCULAR | Status: DC | PRN
  Administered 2015-09-05: 10 mL via INTRAVENOUS
  Filled 2015-09-05: qty 10

## 2015-09-05 MED ORDER — SODIUM CHLORIDE 0.9 % IV SOLN
2200.0000 mg/m2 | INTRAVENOUS | Status: DC
  Administered 2015-09-05: 3950 mg via INTRAVENOUS
  Filled 2015-09-05: qty 79

## 2015-09-05 MED ORDER — OXALIPLATIN CHEMO INJECTION 100 MG/20ML
70.0000 mg/m2 | Freq: Once | INTRAVENOUS | Status: AC
  Administered 2015-09-05: 125 mg via INTRAVENOUS
  Filled 2015-09-05: qty 15

## 2015-09-05 NOTE — Progress Notes (Signed)
Dr. Burr Medico aware of platelets of 97. Ok to treat.

## 2015-09-05 NOTE — Addendum Note (Signed)
Addended by: Truitt Merle on: 09/05/2015 01:40 PM   Modules accepted: Orders

## 2015-09-05 NOTE — Progress Notes (Addendum)
[place]  [phone] [phone]  Clinic Follow Up Note   Patient Care Team: Dione Housekeeper, MD as PCP - General (Family Medicine) Tania Ade, RN as Registered Nurse (Oncology) 09/05/2015  CHIEF COMPLAINTS:  Follow-up of metastatic colon cancer  Oncology History   Metastatic colon cancer to liver   Staging form: Colon and Rectum, AJCC 7th Edition     Clinical: Stage Unknown ([place], NX, M1) - Unsigned        Metastatic colon cancer to liver ([place])   07/14/2014 Imaging    CT abdomen and pelvis with contrast showed a 6 cm segment of sigmoid bowel wall thickening with adjacent 14 x 12 mm nodules, diffuse hepatic metastasis. No bowel obstruction.     07/14/2014 Initial Diagnosis    Metastatic colon cancer to liver     07/25/2014 Procedure    Colonoscopy showed a near circumferential medial mass in the sigmoid colon, partially obstructing consistent with carcinoma.     07/25/2014 Initial Biopsy    Sigmoid: Biopsy showed invasive adenocarcinoma arising in a background of high-grade dysplasia.     07/28/2014 Imaging    CT chest showed no evidence of metastasis     08/09/2014 -  Chemotherapy    mFOLFOX6 every 2 weeks, dose reduction from cycle 7 due to cytopenia, Avastin added from cycle 3     08/11/2014 - 08/15/2014 Hospital Admission    Pt was admitted for fever and nausea, treated for UTI, liver biopsy cancelled due to the infection       HISTORY OF PRESENTING ILLNESS:  Michelle Erickson 60 y.o. female is here because of abnoaml CT findings which is highly suspecious for metastatic colon cancer.   She presented intermittent nausea, anorexia and abdominal pain since Nov 2015, she has mild pain in the LUQ, crapy pain, positional (bending over),   it happened once every 2-3 weeks, and it has been more frequent in the past month. She has normal BM, but did notice the caliber of the stool is smaller than before.  She denies any melena or hematochezia. She lost about 13 lbs in the past 6 months.   She presents to local emergency room in November 2015, and May 2016, was felt to be related to gastric virus. Due to the worsening symptoms lately, she went to [facility] ED on 07/13/2014, CT abdomen was obtained which showed multiple large liver metastasis and probable sigmoid colon mass. She was referred to [place] for further workup. She is scheduled to see GI Dr. Olevia Perches this afternoon.  CURRENT THERAPY: mFOLFOX6 and Avastin every 2 weeks, started on 08/09/2014   INTERIM HISTORY Michelle Erickson returns for follow-up and chemo. She is accompanied by her husband to the clinic today. She had chemotherapy break for 2 weeks last month, and she really liked it. She had more energy, was able to eat better, and went to church more frequent which she enjoyed. She restarted chemotherapy 2 weeks ago, started well, no significant complaints. No nausea, diarrhea, she has been eating well overall, weight is stable. No neuropathy.  MEDICAL HISTORY Past Medical History:  Diagnosis Date  . Back pain   . Diabetes mellitus without complication ([place])   . Family history of colon cancer   . Hyperlipemia   . Hypertension   . Metastatic colon cancer to liver ([place]) 07/21/2014  . Snoring   . Wears glasses    contacts/glasses    SURGICAL HISTORY: Past Surgical History:  Procedure Laterality  Date  . CATARACT EXTRACTION Bilateral   . MOUTH SURGERY    . PORTACATH PLACEMENT N/A 08/03/2014   Procedure: INSERTION PORT-A-CATH;  Surgeon: Stark Klein, MD;  Location: [facility];  Service: General;  Laterality: N/A;    SOCIAL HISTORY: Social History   Social History  . Marital status: Married    Spouse name: Michelle Erickson  . Number of children: 2  . Years of education: N/A   Occupational History  . Not on file.   Social History Main Topics  . Smoking status: Former Smoker    Packs/day: 1.00    Years: 6.00    Types: Cigarettes    Quit  date: 01/21/1976  . Smokeless tobacco: Never Used  . Alcohol use No  . Drug use: No  . Sexual activity: Yes    Birth control/ protection: Post-menopausal     Comment: # 2 pregnancies and #2 live births   Other Topics Concern  . Not on file   Social History Narrative   Married, husband [name]   Has #2 adult children   Previously worked at [employer] HISTORY: Family History  Problem Relation Age of Onset  . COPD Father   . Heart attack Father   . Arthritis Sister   . Diabetes Sister   . Colon cancer Sister     dx over 61  . Colon polyps Sister   . Seizures Brother   . Colon cancer Sister     dx in her early 34s, died in her early to mid 12s  . Diabetes Mother     ALLERGIES:  has No Known Allergies.  MEDICATIONS:  Current Outpatient Prescriptions  Medication Sig Dispense Refill  . amLODipine (NORVASC) 5 MG tablet Take 2 tablets (10 mg total) by mouth daily. 30 tablet 0  . atorvastatin (LIPITOR) 40 MG tablet Take 40 mg by mouth daily.    . cetirizine (ZYRTEC) 10 MG tablet Take 10 mg by mouth daily as needed for allergies. Reported on 05/02/2015    . ferrous sulfate 325 (65 FE) MG tablet Take 325 mg by mouth daily with breakfast.    . Insulin Glargine (LANTUS SOLOSTAR) 100 UNIT/ML Solostar Pen Inject 23 Units into the skin daily at 10 pm.    . insulin lispro (HUMALOG) 100 UNIT/ML injection Inject 4-8 Units into the skin 2 (two) times daily as needed for high blood sugar. Sliding scale    . lidocaine-prilocaine (EMLA) cream     . ondansetron (ZOFRAN-ODT) 8 MG disintegrating tablet Take 8 mg by mouth every 8 (eight) hours as needed for nausea or vomiting. Reported on 05/02/2015     Current Facility-Administered Medications  Medication Dose Route Frequency Provider Last Rate Last Dose  . sodium chloride 0.9 % injection 10 mL  10 mL Intravenous PRN Truitt Merle, MD   10 mL at 09/05/15 1230    REVIEW OF SYSTEMS:   Constitutional: Denies fevers, chills or abnormal  night sweats Eyes: Denies blurriness of vision, double vision or watery eyes Ears, nose, mouth, throat, and face: Denies mucositis or sore throat Respiratory: Denies cough, dyspnea or wheezes Cardiovascular: Denies palpitation, chest discomfort or lower extremity swelling Gastrointestinal:  Denies nausea, heartburn or change in bowel habits Skin: Denies abnormal skin rashes Lymphatics: Denies new lymphadenopathy or easy bruising Neurological:Denies numbness, tingling or new weaknesses Behavioral/Psych: Mood is stable, no new changes  All other systems were reviewed with the patient and are negative.  PHYSICAL EXAMINATION: ECOG PERFORMANCE  STATUS: 0  BP (!) 152/69 (BP Location: Left Arm, Patient Position: Sitting)   Pulse 86   Temp 98.4 F (36.9 C) (Oral)   Resp 18   Ht '5\' 4"'$  (1.626 m)   Wt 160 lb 11.2 oz (72.9 kg)   SpO2 100%   BMI 27.58 kg/m  GENERAL:alert, no distress and comfortable SKIN: skin color, texture, turgor are normal, no rashes or significant lesions EYES: normal, conjunctiva are pink and non-injected, sclera clear OROPHARYNX:no exudate, no erythema and lips, buccal mucosa, and tongue normal  NECK: supple, thyroid normal size, non-tender, without nodularity LYMPH:  no palpable lymphadenopathy in the cervical, axillary or inguinal LUNGS: clear to auscultation and percussion with normal breathing effort HEART: regular rate & rhythm and no murmurs and no lower extremity edema ABDOMEN:abdomen soft, non-tender and normal bowel sounds Musculoskeletal:no cyanosis of digits and no clubbing  PSYCH: alert & oriented x 3 with fluent speech NEURO: no focal motor/sensory deficits  LABORATORY DATA:  I have reviewed the data as listed CBC Latest Ref Rng & Units 09/05/2015 08/21/2015 08/08/2015  WBC 3.9 - 10.3 10e3/uL 3.9 6.3 4.6  Hemoglobin 11.6 - 15.9 g/dL 9.2(L) 9.8(L) 9.6(L)  Hematocrit 34.8 - 46.6 % 28.7(L) 30.1(L) 29.9(L)  Platelets 145 - 400 10e3/uL 97(L) 136(L) 102(L)    CMP Latest Ref Rng & Units 08/21/2015 08/08/2015 07/25/2015  Glucose 70 - 140 mg/dl 131 71 136  BUN 7.0 - 26.0 mg/dL 21.4 15.3 22.7  Creatinine 0.6 - 1.1 mg/dL 1.3(H) 1.1 1.3(H)  Sodium 136 - 145 mEq/L 140 143 142  Potassium 3.5 - 5.1 mEq/L 4.7 4.1 4.7  Chloride 101 - 111 mmol/L - - -  CO2 22 - 29 mEq/L '24 26 24  '$ Calcium 8.4 - 10.4 mg/dL 9.3 9.7 9.4  Total Protein 6.4 - 8.3 g/dL 6.8 7.2 7.2  Total Bilirubin 0.20 - 1.20 mg/dL <0.30 <0.30 <0.30  Alkaline Phos 40 - 150 U/L 135 115 113  AST 5 - 34 U/L 33 30 38(H)  ALT 0 - 55 U/L 27 29 37   Results for LONEY, PETO (MRN [id number]) as of 07/25/2015 08:30  Ref. Range 02/07/2015 08:19 03/07/2015 08:20 04/04/2015 09:58 05/02/2015 10:58 05/30/2015 08:22 06/27/2015 08:10  CEA Latest Units: ng/mL 6.0 (H) 6.6      CEA Latest Ref Range: 0.0-4.7 ng/mL 16.0 (H) 17.7 (H) 18.3 (H) 20.8 (H) 21.4 (H) 24.7 (H)    PATHOLOGY REPORTS: Diagnosis 07/25/2014  Surgical [P], sigmoid - INVASIVE ADENOCARCINOMA ARISING IN A BACKGROUND OF HIGH GRADE DYSPLASIA. - SEE COMMENT. Microscopic Comment Results are phoned to Dr. Olevia Perches. Internal departmental review obtained (Dr. Donato Heinz) with agreement.  Diagnosis 09/05/2014 Liver, biopsy - METASTATIC ADENOCARCINOMA, SEE COMMENT. Microscopic Comment Given the patient's history of sigmoid invasive adenocarcinoma and the morphology, the findings are consistent with metastatic colorectal adenocarcinoma. The case was called to Dr. Burr Medico. Per request, MMR, MSI, and KRAS testing will be ordered.  KRAS MUTATION (+) c36G>A (p. G12D)  RADIOGRAPHIC STUDIES: I have personally reviewed the radiological images as listed and agreed with the findings in the report.   CT chest, abdomen and pelvis with contrast 07/18/2015 IMPRESSION: 1. Multiple liver metastases are again noted. These are stable to minimally increased in size compared with previous exam. 2. Soft tissue nodule adjacent to the sigmoid colon is stable compared with previous  exam. 3. Aortic atherosclerosis and coronary artery disease.    Colonoscopy 07/25/2014 Dr. Olevia Perches  ENDOSCOPIC IMPRESSION: Near circumferential medium mass was found in the sigmoid colon;  multiple biopsies were performed using cold forceps, partially obstructing mass consistent with carcinoma. Placement of endoclips and tattoo at the margins of the mass which extends from 18-25 cm from the anus  ASSESSMENT & PLAN: 60 year old African-American female, presented with intermittent nausea vomiting and anemia.   1. Sigmoid colon cancer with liver metastasis, TxNxM1, stage IV, MSI stable, KRAS mutation(+) -I previously reviewed her CT scans, colonoscopy, and colon mass biopsy findings with patient and her husband in great details, images were reviewed in person  -I reviewed her liver biopsy results, which confirmed metastasis from colon cancer. -The natural history of metastatic colon cancer and treatment options were discussed with patient and her husband. Giving the diffuse metastasis in the liver, this is unfortunately incurable disease. I recommend systemic chemotherapy to control her metastatic disease, the goal of therapy is palliative and to prolong her life. -she is on first-line chemotherapy FOLFOX and Avastin every 2 weeks, tolerarting well. -her tumor MSI is stable, KRAS mutated, not a candidate for immunotherapy or EGFR inhibitor. -Her CEA has dropped significantly. I reviewed her restaging CT scan from 07/18/2015 which showed stable disease overall, slightly increased of a few liver lesions.  -I recommend her to continue current first-line chemotherapy given the stable disease overall an excellent tolerance. We discussed she might develop resistance to the chemotherapy in the near future, and I would consider switching to second line treatment.  We'll continue follow-up her CEA monthly.  -She would like to take chemotherapy break intermittently. We decided to skip one cycle chemotherapy  every 4 cycles  - Today's lab results were reviewed with her, mild thrombocytopenia, adequate for treatment. 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 -Her ferritin is normal, serum iron and saturation slightly low, TIBC low normal, I suggest her to take oral iron supplement, she tolerates well, we'll continue once daily, with orange juice or vitamin C  -will repeat her iron study 0n 06/13/2015 showed adequate iron level  3. DM, HTN -Continue follow-up with PCP - we discussed the potential impact of chemotherapy and premedication dexamethasone on her blood pressure and sugar,will  Monitor closely. Her BG has been well controlled lately  -Her blood pressure has been high lately, we'll repeat in the infusion room. I encouraged her to measure her blood pressure at home. We discussed Avastin can increase her blood pressure, need to be monitored closely. If SBP persistently high than 150, I'll consider adding on a new antihypertensive medication. She will follow-up with her primary care physician also.  4. AKI -Cr has been around 1.1-1.3  will continue monitoring renal function  -I encourage her to drink more fluids  -IV fluids as needed  5. Mild thrombocytopenia -Secondary to chemotherapy. -will monitor closely.  PLan -Lab reviewed, platelet 97K, mild anemia, adequate for treatment. Continue FOLFOX plus Avastin today and every 2 weeks, no Neulasta -I'll see her back in 4 weeks, will give her chemo break after 2 more cycles, and will order restaging CT on her next visit   All questions were answered. The patient knows to call the clinic with any problems, questions or concerns.  I spent 20 minutes counseling the patient face to face. The total time spent in the appointment was 25 minutes and  more than 50% was on counseling.     Burr Medico,  Krista Blue, MD 09/05/2015    Addendum -Pt's urine protein test showed '300mg'$ /dl, increased from prior '100mg'$ /dl. I will hold her Avastin today, check her 24hr urine protein in 2 weeks, and restart Avastin if 24hr urine protein <2g  Truitt Merle  09/05/2015 1:40 PM

## 2015-09-05 NOTE — Progress Notes (Unsigned)
Pt went back to infusion to get accessed

## 2015-09-05 NOTE — Patient Instructions (Signed)
[facility] Discharge Instructions for Patients Receiving Chemotherapy  Today you received the following chemotherapy agents:  Oxaliplatin, Leucovorin, 5FU, and Avastin.  To help prevent nausea and vomiting after your treatment, we encourage you to take your nausea medication as directed.   If you develop nausea and vomiting that is not controlled by your nausea medication, call the clinic.   BELOW ARE SYMPTOMS THAT SHOULD BE REPORTED IMMEDIATELY:  *FEVER GREATER THAN 100.5 F  *CHILLS WITH OR WITHOUT FEVER  NAUSEA AND VOMITING THAT IS NOT CONTROLLED WITH YOUR NAUSEA MEDICATION  *UNUSUAL SHORTNESS OF BREATH  *UNUSUAL BRUISING OR BLEEDING  TENDERNESS IN MOUTH AND THROAT WITH OR WITHOUT PRESENCE OF ULCERS  *URINARY PROBLEMS  *BOWEL PROBLEMS  UNUSUAL RASH Items with * indicate a potential emergency and should be followed up as soon as possible.  Feel free to call the clinic you have any questions or concerns. The clinic phone number is [phone].  Please show the CHEMO ALERT CARD at check-in to the Emergency Department and triage nurse.   

## 2015-09-07 ENCOUNTER — Ambulatory Visit (HOSPITAL_BASED_OUTPATIENT_CLINIC_OR_DEPARTMENT_OTHER): Payer: BLUE CROSS/BLUE SHIELD

## 2015-09-07 ENCOUNTER — Telehealth: Payer: Self-pay | Admitting: Hematology

## 2015-09-07 DIAGNOSIS — C189 Malignant neoplasm of colon, unspecified: Secondary | ICD-10-CM

## 2015-09-07 DIAGNOSIS — C187 Malignant neoplasm of sigmoid colon: Secondary | ICD-10-CM | POA: Diagnosis not present

## 2015-09-07 DIAGNOSIS — Z452 Encounter for adjustment and management of vascular access device: Secondary | ICD-10-CM | POA: Diagnosis not present

## 2015-09-07 DIAGNOSIS — C787 Secondary malignant neoplasm of liver and intrahepatic bile duct: Principal | ICD-10-CM

## 2015-09-07 MED ORDER — HEPARIN SOD (PORK) LOCK FLUSH 100 UNIT/ML IV SOLN
500.0000 [IU] | Freq: Once | INTRAVENOUS | Status: AC | PRN
  Administered 2015-09-07: 500 [IU]
  Filled 2015-09-07: qty 5

## 2015-09-07 MED ORDER — SODIUM CHLORIDE 0.9 % IJ SOLN
10.0000 mL | INTRAMUSCULAR | Status: DC | PRN
  Administered 2015-09-07: 10 mL
  Filled 2015-09-07: qty 10

## 2015-09-07 NOTE — Telephone Encounter (Signed)
Gave pt cal & avs °

## 2015-09-07 NOTE — Patient Instructions (Signed)

## 2015-09-08 LAB — PROTEIN, URINE, 24 HOUR
PROTEIN,TOTAL,URINE: 191.3 mg/dL
Prot,24hr calculated: 2200 mg/24 hr — ABNORMAL HIGH (ref 30–150)

## 2015-09-19 ENCOUNTER — Other Ambulatory Visit (HOSPITAL_BASED_OUTPATIENT_CLINIC_OR_DEPARTMENT_OTHER): Payer: BLUE CROSS/BLUE SHIELD

## 2015-09-19 ENCOUNTER — Ambulatory Visit (HOSPITAL_BASED_OUTPATIENT_CLINIC_OR_DEPARTMENT_OTHER): Payer: BLUE CROSS/BLUE SHIELD

## 2015-09-19 VITALS — BP 164/76 | HR 86 | Temp 98.2°F | Resp 16

## 2015-09-19 DIAGNOSIS — C187 Malignant neoplasm of sigmoid colon: Secondary | ICD-10-CM

## 2015-09-19 DIAGNOSIS — C189 Malignant neoplasm of colon, unspecified: Secondary | ICD-10-CM

## 2015-09-19 DIAGNOSIS — C787 Secondary malignant neoplasm of liver and intrahepatic bile duct: Secondary | ICD-10-CM | POA: Diagnosis not present

## 2015-09-19 DIAGNOSIS — Z5111 Encounter for antineoplastic chemotherapy: Secondary | ICD-10-CM

## 2015-09-19 DIAGNOSIS — Z452 Encounter for adjustment and management of vascular access device: Secondary | ICD-10-CM

## 2015-09-19 DIAGNOSIS — Z95828 Presence of other vascular implants and grafts: Secondary | ICD-10-CM

## 2015-09-19 LAB — COMPREHENSIVE METABOLIC PANEL
ALBUMIN: 2.9 g/dL — AB (ref 3.5–5.0)
ALK PHOS: 112 U/L (ref 40–150)
ALT: 36 U/L (ref 0–55)
ANION GAP: 6 meq/L (ref 3–11)
AST: 35 U/L — ABNORMAL HIGH (ref 5–34)
BUN: 33 mg/dL — ABNORMAL HIGH (ref 7.0–26.0)
CALCIUM: 9 mg/dL (ref 8.4–10.4)
CO2: 25 mEq/L (ref 22–29)
Chloride: 110 mEq/L — ABNORMAL HIGH (ref 98–109)
Creatinine: 1.4 mg/dL — ABNORMAL HIGH (ref 0.6–1.1)
EGFR: 48 mL/min/{1.73_m2} — AB (ref >90)
GLUCOSE: 116 mg/dL (ref 70–140)
Potassium: 5.2 mEq/L — ABNORMAL HIGH (ref 3.5–5.1)
Sodium: 141 mEq/L (ref 136–145)
TOTAL PROTEIN: 6.6 g/dL (ref 6.4–8.3)
Total Bilirubin: <0.3 mg/dL (ref 0.20–1.20)

## 2015-09-19 LAB — CBC WITH DIFFERENTIAL/PLATELET
BASO%: 0.9 % (ref 0.0–2.0)
Basophils Absolute: 0 10*3/uL (ref 0.0–0.1)
EOS ABS: 0.1 10*3/uL (ref 0.0–0.5)
EOS%: 2.8 % (ref 0.0–7.0)
HEMATOCRIT: 29.5 % — AB (ref 34.8–46.6)
HEMOGLOBIN: 9.4 g/dL — AB (ref 11.6–15.9)
LYMPH#: 1.1 10*3/uL (ref 0.9–3.3)
LYMPH%: 22.2 % (ref 14.0–49.7)
MCH: 31 pg (ref 25.1–34.0)
MCHC: 31.9 g/dL (ref 31.5–36.0)
MCV: 97.3 fL (ref 79.5–101.0)
MONO#: 0.7 10*3/uL (ref 0.1–0.9)
MONO%: 15 % — ABNORMAL HIGH (ref 0.0–14.0)
NEUT%: 59.1 % (ref 38.4–76.8)
NEUTROS ABS: 2.8 10*3/uL (ref 1.5–6.5)
PLATELETS: 116 10*3/uL — AB (ref 145–400)
RBC: 3.04 10*6/uL — ABNORMAL LOW (ref 3.70–5.45)
RDW: 16.1 % — AB (ref 11.2–14.5)
WBC: 4.8 10*3/uL (ref 3.9–10.3)

## 2015-09-19 LAB — CEA (IN HOUSE-CHCC): CEA (CHCC-In House): 20.03 ng/mL — ABNORMAL HIGH (ref 0.00–5.00)

## 2015-09-19 MED ORDER — DEXTROSE 5 % IV SOLN
Freq: Once | INTRAVENOUS | Status: AC
  Administered 2015-09-19: 10:00:00 via INTRAVENOUS

## 2015-09-19 MED ORDER — HEPARIN SOD (PORK) LOCK FLUSH 100 UNIT/ML IV SOLN
500.0000 [IU] | Freq: Once | INTRAVENOUS | Status: DC | PRN
Start: 2015-09-19 — End: 2015-09-19
  Filled 2015-09-19: qty 5

## 2015-09-19 MED ORDER — LEUCOVORIN CALCIUM INJECTION 350 MG
400.0000 mg/m2 | Freq: Once | INTRAVENOUS | Status: AC
  Administered 2015-09-19: 716 mg via INTRAVENOUS
  Filled 2015-09-19: qty 35.8

## 2015-09-19 MED ORDER — SODIUM CHLORIDE 0.9 % IV SOLN
Freq: Once | INTRAVENOUS | Status: AC
  Administered 2015-09-19: 10:00:00 via INTRAVENOUS
  Filled 2015-09-19: qty 4

## 2015-09-19 MED ORDER — OXALIPLATIN CHEMO INJECTION 100 MG/20ML
70.0000 mg/m2 | Freq: Once | INTRAVENOUS | Status: AC
  Administered 2015-09-19: 125 mg via INTRAVENOUS
  Filled 2015-09-19: qty 5

## 2015-09-19 MED ORDER — SODIUM CHLORIDE 0.9 % IJ SOLN
10.0000 mL | INTRAMUSCULAR | Status: DC | PRN
  Filled 2015-09-19: qty 10

## 2015-09-19 MED ORDER — SODIUM CHLORIDE 0.9 % IV SOLN
2200.0000 mg/m2 | INTRAVENOUS | Status: DC
  Administered 2015-09-19: 3950 mg via INTRAVENOUS
  Filled 2015-09-19: qty 79

## 2015-09-19 MED ORDER — SODIUM CHLORIDE 0.9 % IJ SOLN
10.0000 mL | INTRAMUSCULAR | Status: DC | PRN
  Administered 2015-09-19: 10 mL via INTRAVENOUS
  Filled 2015-09-19: qty 10

## 2015-09-19 NOTE — Progress Notes (Signed)
Pt's Potassium is 5.2 on repeat lab draw. MD Burr Medico aware. Pt notified and given instructions on foods low in potassium.

## 2015-09-19 NOTE — Patient Instructions (Addendum)
[facility] Discharge Instructions for Patients Receiving Chemotherapy  Today you received the following chemotherapy agents Leucovorin, Oxaliplatin and Adrucil  To help prevent nausea and vomiting after your treatment, we encourage you to take your nausea medication as directed.    If you develop nausea and vomiting that is not controlled by your nausea medication, call the clinic.   BELOW ARE SYMPTOMS THAT SHOULD BE REPORTED IMMEDIATELY:  *FEVER GREATER THAN 100.5 F  *CHILLS WITH OR WITHOUT FEVER  NAUSEA AND VOMITING THAT IS NOT CONTROLLED WITH YOUR NAUSEA MEDICATION  *UNUSUAL SHORTNESS OF BREATH  *UNUSUAL BRUISING OR BLEEDING  TENDERNESS IN MOUTH AND THROAT WITH OR WITHOUT PRESENCE OF ULCERS  *URINARY PROBLEMS  *BOWEL PROBLEMS  UNUSUAL RASH Items with * indicate a potential emergency and should be followed up as soon as possible.  Feel free to call the clinic you have any questions or concerns. The clinic phone number is [phone].  Please show the [facility] at check-in to the Emergency Department and triage nurse.   Hyperkalemia Hyperkalemia is when you have too much potassium in your blood. Potassium is normally removed (excreted) from your body by your kidneys. If there is too much potassium in your blood, it can affect your heart's ability to function.  CAUSES  Hyperkalemia may be caused by:   Taking in too much potassium. You can do this by:  Using salt substitutes. They contain large amounts of potassium.  Taking potassium supplements.  Eating foods high in potassium.  Excreting too little potassium. This can happen if:  Your kidneys are not working properly. Kidney (renal) disease, including short- or long-term renal failure, is a very common cause of hyperkalemia.  You are taking medicines that lower your excretion of potassium.  You have Addison disease.  You have a urinary tract blockage, such as kidney stones.  You are  on treatment to mechanically clean your blood (dialysis) and you skip a treatment.  Releasing a high amount of potassium from your cells into your blood. This can happen with:  Injury to muscles (rhabdomyolysis) or other tissues. Most potassium is stored in your muscles.  Severe burns or infections.  Acidic blood plasma (acidosis). Acidosis can result from many diseases, such as uncontrolled diabetes. RISK FACTORS The most common risk factor of hyperkalemia is kidney disease. Other risk factors of hyperkalemia include:  Addison disease. This is a condition where your glands do not produce enough hormones.  Alcoholism or heavy drug use.   Using certain blood pressure medicines, such as angiotensin-converting enzyme (ACE) inhibitors, angiotensin II receptor blockers (ARBs), or potassium-sparing diuretics such as spironolactone.  Severe injury or burn.   Potassium Content of Foods Potassium is a mineral found in many foods and drinks. It helps keep fluids and minerals balanced in your body and affects how steadily your heart beats. Potassium also helps control your blood pressure and keep your muscles and nervous system healthy. Certain health conditions and medicines may change the balance of potassium in your body. When this happens, you can help balance your level of potassium through the foods that you do or do not eat. Your health care provider or dietitian may recommend an amount of potassium that you should have each day. The following lists of foods provide the amount of potassium (in parentheses) per serving in each item. HIGH IN POTASSIUM  The following foods and beverages have 200 mg or more of potassium per serving:  Apricots, 2 raw or 5 dry (  200 mg).  Artichoke, 1 medium (345 mg).  Avocado, raw,  each (245 mg).  Banana, 1 medium (425 mg).  Beans, lima, or baked beans, canned,  cup (280 mg).  Beans, white, canned,  cup (595 mg).  Beef roast, 3 oz (320  mg).  Beef, ground, 3 oz (270 mg).  Beets, raw or cooked,  cup (260 mg).  Bran muffin, 2 oz (300 mg).  Broccoli,  cup (230 mg).  Brussels sprouts,  cup (250 mg).  Cantaloupe,  cup (215 mg).  Cereal, 100% bran,  cup (200-400 mg).  Cheeseburger, single, fast food, 1 each (225-400 mg).  Chicken, 3 oz (220 mg).  Clams, canned, 3 oz (535 mg).  Crab, 3 oz (225 mg).  Dates, 5 each (270 mg).  Dried beans and peas,  cup (300-475 mg).  Figs, dried, 2 each (260 mg).  Fish: halibut, tuna, cod, snapper, 3 oz (480 mg).  Fish: salmon, haddock, swordfish, perch, 3 oz (300 mg).  Fish, tuna, canned 3 oz (200 mg).  [place] fries, fast food, 3 oz (470 mg).  Granola with fruit and nuts,  cup (200 mg).  Grapefruit juice,  cup (200 mg).  Greens, beet,  cup (655 mg).  Honeydew melon,  cup (200 mg).  Kale, raw, 1 cup (300 mg).  Kiwi, 1 medium (240 mg).  Kohlrabi, rutabaga, parsnips,  cup (280 mg).  Lentils,  cup (365 mg).  Mango, 1 each (325 mg).  Milk, chocolate, 1 cup (420 mg).  Milk: nonfat, low-fat, whole, buttermilk, 1 cup (350-380 mg).  Molasses, 1 Tbsp (295 mg).  Mushrooms,  cup (280) mg.  Nectarine, 1 each (275 mg).  Nuts: almonds, peanuts, hazelnuts, [place], cashew, mixed, 1 oz (200 mg).  Nuts, pistachios, 1 oz (295 mg).  Orange, 1 each (240 mg).  Orange juice,  cup (235 mg).  Papaya, medium,  fruit (390 mg).  Peanut butter, chunky, 2 Tbsp (240 mg).  Peanut butter, smooth, 2 Tbsp (210 mg).  Pear, 1 medium (200 mg).  Pomegranate, 1 whole (400 mg).  Pomegranate juice,  cup (215 mg).  Pork, 3 oz (350 mg).  Potato chips, salted, 1 oz (465 mg).  Potato, baked with skin, 1 medium (925 mg).  Potatoes, boiled,  cup (255 mg).  Potatoes, mashed,  cup (330 mg).  Prune juice,  cup (370 mg).  Prunes, 5 each (305 mg).  Pudding, chocolate,  cup (230 mg).  Pumpkin, canned,  cup (250 mg).  Raisins, seedless,  cup (270  mg).  Seeds, sunflower or pumpkin, 1 oz (240 mg).  Soy milk, 1 cup (300 mg).  Spinach,  cup (420 mg).  Spinach, canned,  cup (370 mg).  Sweet potato, baked with skin, 1 medium (450 mg).  Swiss chard,  cup (480 mg).  Tomato or vegetable juice,  cup (275 mg).  Tomato sauce or puree,  cup (400-550 mg).  Tomato, raw, 1 medium (290 mg).  Tomatoes, canned,  cup (200-300 mg).  [place], 3 oz (250 mg).  Wheat germ, 1 oz (250 mg).  Winter squash,  cup (250 mg).  Yogurt, plain or fruited, 6 oz (260-435 mg).  Zucchini,  cup (220 mg). MODERATE IN POTASSIUM The following foods and beverages have 50-200 mg of potassium per serving:  Apple, 1 each (150 mg).  Apple juice,  cup (150 mg).  Applesauce,  cup (90 mg).  Apricot nectar,  cup (140 mg).  Asparagus, small spears,  cup or 6 spears (155 mg).  Bagel, cinnamon raisin, 1  each (130 mg).  Bagel, egg or plain, 4 in., 1 each (70 mg).  Beans, green,  cup (90 mg).  Beans, yellow,  cup (190 mg).  Beer, regular, 12 oz (100 mg).  Beets, canned,  cup (125 mg).  Blackberries,  cup (115 mg).  Blueberries,  cup (60 mg).  Bread, whole wheat, 1 slice (70 mg).  Broccoli, raw,  cup (145 mg).  Cabbage,  cup (150 mg).  Carrots, cooked or raw,  cup (180 mg).  Cauliflower, raw,  cup (150 mg).  Celery, raw,  cup (155 mg).  Cereal, bran flakes, cup (120-150 mg).  Cheese, cottage,  cup (110 mg).  Cherries, 10 each (150 mg).  Chocolate, 1 oz bar (165 mg).  Coffee, brewed 6 oz (90 mg).  Corn,  cup or 1 ear (195 mg).  Cucumbers,  cup (80 mg).  Egg, large, 1 each (60 mg).  Eggplant,  cup (60 mg).  Endive, raw, cup (80 mg).  English muffin, 1 each (65 mg).  Fish, orange roughy, 3 oz (150 mg).  Frankfurter, beef or pork, 1 each (75 mg).  Fruit cocktail,  cup (115 mg).  Grape juice,  cup (170 mg).  Grapefruit,  fruit (175 mg).  Grapes,  cup (155 mg).  Greens: kale, turnip,  collard,  cup (110-150 mg).  Ice cream or frozen yogurt, chocolate,  cup (175 mg).  Ice cream or frozen yogurt, vanilla,  cup (120-150 mg).  Lemons, limes, 1 each (80 mg).  Lettuce, all types, 1 cup (100 mg).  Mixed vegetables,  cup (150 mg).  Mushrooms, raw,  cup (110 mg).  Nuts: walnuts, pecans, or macadamia, 1 oz (125 mg).  Oatmeal,  cup (80 mg).  Okra,  cup (110 mg).  Onions, raw,  cup (120 mg).  Peach, 1 each (185 mg).  Peaches, canned,  cup (120 mg).  Pears, canned,  cup (120 mg).  Peas, green, frozen,  cup (90 mg).  Peppers, green,  cup (130 mg).  Peppers, red,  cup (160 mg).  Pineapple juice,  cup (165 mg).  Pineapple, fresh or canned,  cup (100 mg).  Plums, 1 each (105 mg).  Pudding, vanilla,  cup (150 mg).  Raspberries,  cup (90 mg).  Rhubarb,  cup (115 mg).  Rice, wild,  cup (80 mg).  Shrimp, 3 oz (155 mg).  Spinach, raw, 1 cup (170 mg).  Strawberries,  cup (125 mg).  Summer squash  cup (175-200 mg).  Swiss chard, raw, 1 cup (135 mg).  Tangerines, 1 each (140 mg).  Tea, brewed, 6 oz (65 mg).  Turnips,  cup (140 mg).  Watermelon,  cup (85 mg).  Wine, red, table, 5 oz (180 mg).  Wine, white, table, 5 oz (100 mg). LOW IN POTASSIUM The following foods and beverages have less than 50 mg of potassium per serving.  Bread, white, 1 slice (30 mg).  Carbonated beverages, 12 oz (less than 5 mg).  Cheese, 1 oz (20-30 mg).  Cranberries,  cup (45 mg).  Cranberry juice cocktail,  cup (20 mg).  Fats and oils, 1 Tbsp (less than 5 mg).  Hummus, 1 Tbsp (32 mg).  Nectar: papaya, mango, or pear,  cup (35 mg).  Rice, white or [place],  cup (50 mg).  Spaghetti or macaroni,  cup cooked (30 mg).  Tortilla, flour or corn, 1 each (50 mg).  Waffle, 4 in., 1 each (50 mg).  Water chestnuts,  cup (40 mg).   This information is not intended to  replace advice given to you by your health care provider. Make sure you  discuss any questions you have with your health care provider.   Document Released: 08/20/2004 Document Revised: 01/11/2013 Document Reviewed: 12/03/2012 Elsevier Interactive Patient Education [employer].

## 2015-09-19 NOTE — Patient Instructions (Signed)

## 2015-09-19 NOTE — Progress Notes (Signed)
Per Dr. Burr Medico, do not have to wait for CMP results before starting treatment today.

## 2015-09-20 ENCOUNTER — Encounter: Payer: Self-pay | Admitting: *Deleted

## 2015-09-20 LAB — CEA: CEA1: 37.9 ng/mL — AB (ref 0.0–4.7)

## 2015-09-21 ENCOUNTER — Ambulatory Visit (HOSPITAL_BASED_OUTPATIENT_CLINIC_OR_DEPARTMENT_OTHER): Payer: BLUE CROSS/BLUE SHIELD

## 2015-09-21 VITALS — BP 155/70 | HR 82 | Temp 98.2°F | Resp 18

## 2015-09-21 DIAGNOSIS — Z452 Encounter for adjustment and management of vascular access device: Secondary | ICD-10-CM

## 2015-09-21 DIAGNOSIS — C187 Malignant neoplasm of sigmoid colon: Secondary | ICD-10-CM

## 2015-09-21 DIAGNOSIS — C787 Secondary malignant neoplasm of liver and intrahepatic bile duct: Principal | ICD-10-CM

## 2015-09-21 DIAGNOSIS — C189 Malignant neoplasm of colon, unspecified: Secondary | ICD-10-CM

## 2015-09-21 MED ORDER — HEPARIN SOD (PORK) LOCK FLUSH 100 UNIT/ML IV SOLN
500.0000 [IU] | Freq: Once | INTRAVENOUS | Status: AC | PRN
  Administered 2015-09-21: 500 [IU]
  Filled 2015-09-21: qty 5

## 2015-09-21 MED ORDER — SODIUM CHLORIDE 0.9 % IJ SOLN
10.0000 mL | INTRAMUSCULAR | Status: DC | PRN
  Administered 2015-09-21: 10 mL
  Filled 2015-09-21: qty 10

## 2015-09-21 NOTE — Patient Instructions (Signed)

## 2015-10-03 ENCOUNTER — Telehealth: Payer: Self-pay | Admitting: Hematology

## 2015-10-03 ENCOUNTER — Ambulatory Visit (HOSPITAL_BASED_OUTPATIENT_CLINIC_OR_DEPARTMENT_OTHER): Payer: BLUE CROSS/BLUE SHIELD | Admitting: Hematology

## 2015-10-03 ENCOUNTER — Telehealth: Payer: Self-pay | Admitting: *Deleted

## 2015-10-03 ENCOUNTER — Other Ambulatory Visit (HOSPITAL_BASED_OUTPATIENT_CLINIC_OR_DEPARTMENT_OTHER): Payer: BLUE CROSS/BLUE SHIELD

## 2015-10-03 ENCOUNTER — Encounter: Payer: Self-pay | Admitting: Hematology

## 2015-10-03 ENCOUNTER — Ambulatory Visit (HOSPITAL_BASED_OUTPATIENT_CLINIC_OR_DEPARTMENT_OTHER): Payer: BLUE CROSS/BLUE SHIELD

## 2015-10-03 VITALS — BP 133/71 | HR 90 | Temp 98.4°F | Resp 17 | Ht 64.0 in | Wt 155.5 lb

## 2015-10-03 DIAGNOSIS — C189 Malignant neoplasm of colon, unspecified: Secondary | ICD-10-CM

## 2015-10-03 DIAGNOSIS — Z95828 Presence of other vascular implants and grafts: Secondary | ICD-10-CM

## 2015-10-03 DIAGNOSIS — E119 Type 2 diabetes mellitus without complications: Secondary | ICD-10-CM | POA: Diagnosis not present

## 2015-10-03 DIAGNOSIS — Z5111 Encounter for antineoplastic chemotherapy: Secondary | ICD-10-CM

## 2015-10-03 DIAGNOSIS — C787 Secondary malignant neoplasm of liver and intrahepatic bile duct: Secondary | ICD-10-CM | POA: Diagnosis not present

## 2015-10-03 DIAGNOSIS — I10 Essential (primary) hypertension: Secondary | ICD-10-CM

## 2015-10-03 DIAGNOSIS — C187 Malignant neoplasm of sigmoid colon: Secondary | ICD-10-CM

## 2015-10-03 DIAGNOSIS — Z452 Encounter for adjustment and management of vascular access device: Secondary | ICD-10-CM

## 2015-10-03 DIAGNOSIS — D630 Anemia in neoplastic disease: Secondary | ICD-10-CM

## 2015-10-03 DIAGNOSIS — D6959 Other secondary thrombocytopenia: Secondary | ICD-10-CM

## 2015-10-03 DIAGNOSIS — N179 Acute kidney failure, unspecified: Secondary | ICD-10-CM

## 2015-10-03 LAB — CBC WITH DIFFERENTIAL/PLATELET
BASO%: 0.5 % (ref 0.0–2.0)
BASOS ABS: 0 10*3/uL (ref 0.0–0.1)
EOS ABS: 0.2 10*3/uL (ref 0.0–0.5)
EOS%: 3 % (ref 0.0–7.0)
HCT: 29.9 % — ABNORMAL LOW (ref 34.8–46.6)
HGB: 9.5 g/dL — ABNORMAL LOW (ref 11.6–15.9)
LYMPH%: 18.5 % (ref 14.0–49.7)
MCH: 30.3 pg (ref 25.1–34.0)
MCHC: 31.7 g/dL (ref 31.5–36.0)
MCV: 95.6 fL (ref 79.5–101.0)
MONO#: 0.9 10*3/uL (ref 0.1–0.9)
MONO%: 15.4 % — AB (ref 0.0–14.0)
NEUT#: 3.8 10*3/uL (ref 1.5–6.5)
NEUT%: 62.6 % (ref 38.4–76.8)
Platelets: 114 10*3/uL — ABNORMAL LOW (ref 145–400)
RBC: 3.13 10*6/uL — AB (ref 3.70–5.45)
RDW: 16 % — ABNORMAL HIGH (ref 11.2–14.5)
WBC: 6.1 10*3/uL (ref 3.9–10.3)
lymph#: 1.1 10*3/uL (ref 0.9–3.3)

## 2015-10-03 LAB — COMPREHENSIVE METABOLIC PANEL
ALK PHOS: 110 U/L (ref 40–150)
ALT: 29 U/L (ref 0–55)
AST: 31 U/L (ref 5–34)
Albumin: 3.1 g/dL — ABNORMAL LOW (ref 3.5–5.0)
Anion Gap: 9 mEq/L (ref 3–11)
BUN: 18.8 mg/dL (ref 7.0–26.0)
CHLORIDE: 107 meq/L (ref 98–109)
CO2: 24 meq/L (ref 22–29)
Calcium: 9.4 mg/dL (ref 8.4–10.4)
Creatinine: 1.5 mg/dL — ABNORMAL HIGH (ref 0.6–1.1)
EGFR: 43 mL/min/{1.73_m2} — AB (ref >90)
GLUCOSE: 166 mg/dL — AB (ref 70–140)
POTASSIUM: 4.6 meq/L (ref 3.5–5.1)
SODIUM: 141 meq/L (ref 136–145)
Total Bilirubin: <0.3 mg/dL (ref 0.20–1.20)
Total Protein: 7.1 g/dL (ref 6.4–8.3)

## 2015-10-03 LAB — UA PROTEIN, DIPSTICK - CHCC: Protein, ur: 300 mg/dL

## 2015-10-03 MED ORDER — LEUCOVORIN CALCIUM INJECTION 350 MG
400.0000 mg/m2 | Freq: Once | INTRAVENOUS | Status: AC
  Administered 2015-10-03: 716 mg via INTRAVENOUS
  Filled 2015-10-03: qty 35.8

## 2015-10-03 MED ORDER — DEXTROSE 5 % IV SOLN
Freq: Once | INTRAVENOUS | Status: AC
  Administered 2015-10-03: 10:00:00 via INTRAVENOUS

## 2015-10-03 MED ORDER — SODIUM CHLORIDE 0.9 % IV SOLN
2200.0000 mg/m2 | INTRAVENOUS | Status: DC
  Administered 2015-10-03: 3950 mg via INTRAVENOUS
  Filled 2015-10-03: qty 79

## 2015-10-03 MED ORDER — OXALIPLATIN CHEMO INJECTION 100 MG/20ML
70.0000 mg/m2 | Freq: Once | INTRAVENOUS | Status: AC
  Administered 2015-10-03: 125 mg via INTRAVENOUS
  Filled 2015-10-03: qty 20

## 2015-10-03 MED ORDER — SODIUM CHLORIDE 0.9 % IV SOLN
Freq: Once | INTRAVENOUS | Status: AC
  Administered 2015-10-03: 11:00:00 via INTRAVENOUS
  Filled 2015-10-03: qty 4

## 2015-10-03 MED ORDER — SODIUM CHLORIDE 0.9 % IJ SOLN
10.0000 mL | INTRAMUSCULAR | Status: DC | PRN
  Administered 2015-10-03: 10 mL via INTRAVENOUS
  Filled 2015-10-03: qty 10

## 2015-10-03 NOTE — Patient Instructions (Signed)

## 2015-10-03 NOTE — Patient Instructions (Signed)

## 2015-10-03 NOTE — Telephone Encounter (Signed)
Per LOS I have scheduled appts and notified the scheduler 

## 2015-10-03 NOTE — Progress Notes (Signed)
Michelle Erickson  [phone] [phone]  Clinic Follow Up Note   Patient Care Team: Dione Housekeeper, MD as PCP - General (Family Medicine) Tania Ade, RN as Registered Nurse (Oncology) 10/03/2015  CHIEF COMPLAINTS:  Follow-up of metastatic colon cancer  Oncology History   Metastatic colon cancer to liver   Staging form: Colon and Rectum, AJCC 7th Edition     Clinical: Stage Unknown ([place], NX, M1) - Unsigned        Metastatic colon cancer to liver ([place])   07/14/2014 Imaging    CT abdomen and pelvis with contrast showed a 6 cm segment of sigmoid bowel wall thickening with adjacent 14 x 12 mm nodules, diffuse hepatic metastasis. No bowel obstruction.      07/14/2014 Initial Diagnosis    Metastatic colon cancer to liver      07/25/2014 Procedure    Colonoscopy showed a near circumferential medial mass in the sigmoid colon, partially obstructing consistent with carcinoma.      07/25/2014 Initial Biopsy    Sigmoid: Biopsy showed invasive adenocarcinoma arising in a background of high-grade dysplasia.      07/28/2014 Imaging    CT chest showed no evidence of metastasis      08/09/2014 -  Chemotherapy    mFOLFOX6 every 2 weeks, dose reduction from cycle 7 due to cytopenia, Avastin added from cycle 3      08/11/2014 - 08/15/2014 Hospital Admission    Pt was admitted for fever and nausea, treated for UTI, liver biopsy cancelled due to the infection        HISTORY OF PRESENTING ILLNESS:  Michelle Erickson 60 y.o. female is here because of abnoaml CT findings which is highly suspecious for metastatic colon cancer.   She presented intermittent nausea, anorexia and abdominal pain since Nov 2015, she has mild pain in the LUQ, crapy pain, positional (bending over),   it happened once every 2-3 weeks, and it has been more frequent in the past month. She has normal BM, but did notice the caliber of the stool is smaller  than before. She denies any melena or hematochezia. She lost about 13 lbs in the past 6 months.   She presents to local emergency room in November 2015, and May 2016, was felt to be related to gastric virus. Due to the worsening symptoms lately, she went to [facility] ED on 07/13/2014, CT abdomen was obtained which showed multiple large liver metastasis and probable sigmoid colon mass. She was referred to [place] for further workup. She is scheduled to see GI Dr. Olevia Perches this afternoon.  CURRENT THERAPY: mFOLFOX6 and Avastin every 2 weeks, started on 08/09/2014   INTERIM HISTORY Michelle Erickson returns for follow-up and chemo. She is accompanied by her husband to the clinic today. She had 4-5 days of fatigue, low appetite, and a mild nausea after last cycle chemotherapy, recovered well. She denies any significant pain or other symptoms, no neuropathy. She lost about 5 pounds in the past 2 weeks.  MEDICAL HISTORY Past Medical History:  Diagnosis Date  . Back pain   . Diabetes mellitus without complication ([place])   . Family history of colon cancer   . Hyperlipemia   . Hypertension   . Metastatic colon cancer to liver ([place]) 07/21/2014  . Snoring   . Wears glasses    contacts/glasses    SURGICAL HISTORY: Past Surgical History:  Procedure Laterality Date  . CATARACT EXTRACTION Bilateral   . MOUTH SURGERY    .  PORTACATH PLACEMENT N/A 08/03/2014   Procedure: INSERTION PORT-A-CATH;  Surgeon: Stark Klein, MD;  Location: [facility];  Service: General;  Laterality: N/A;    SOCIAL HISTORY: Social History   Social History  . Marital status: Married    Spouse name: Michelle Erickson  . Number of children: 2  . Years of education: N/A   Occupational History  . Not on file.   Social History Main Topics  . Smoking status: Former Smoker    Packs/day: 1.00    Years: 6.00    Types: Cigarettes    Quit date: 01/21/1976  . Smokeless tobacco: Never Used  . Alcohol use No  . Drug use: No  . Sexual activity: Yes     Birth control/ protection: Post-menopausal     Comment: # 2 pregnancies and #2 live births   Other Topics Concern  . Not on file   Social History Narrative   Married, husband Michelle Erickson   Has #2 adult children   Previously worked at [employer] HISTORY: Family History  Problem Relation Age of Onset  . COPD Father   . Heart attack Father   . Arthritis Sister   . Diabetes Sister   . Colon cancer Sister     dx over 39  . Colon polyps Sister   . Seizures Brother   . Colon cancer Sister     dx in her early 23s, died in her early to mid 10s  . Diabetes Mother     ALLERGIES:  has No Known Allergies.  MEDICATIONS:  Current Outpatient Prescriptions  Medication Sig Dispense Refill  . amLODipine (NORVASC) 5 MG tablet Take 2 tablets (10 mg total) by mouth daily. 30 tablet 0  . atorvastatin (LIPITOR) 40 MG tablet Take by mouth.    . cetirizine (ZYRTEC) 10 MG tablet Take 10 mg by mouth daily as needed for allergies. Reported on 05/02/2015    . ferrous sulfate 325 (65 FE) MG tablet Take 325 mg by mouth daily with breakfast.    . Insulin Glargine (LANTUS SOLOSTAR) 100 UNIT/ML Solostar Pen Inject 23 Units into the skin daily at 10 pm.    . insulin lispro (HUMALOG) 100 UNIT/ML injection Inject 4-8 Units into the skin 2 (two) times daily as needed for high blood sugar. Sliding scale    . lidocaine-prilocaine (EMLA) cream     . ondansetron (ZOFRAN-ODT) 8 MG disintegrating tablet Take 8 mg by mouth every 8 (eight) hours as needed for nausea or vomiting. Reported on 05/02/2015     No current facility-administered medications for this visit.     REVIEW OF SYSTEMS:   Constitutional: Denies fevers, chills or abnormal night sweats Eyes: Denies blurriness of vision, double vision or watery eyes Ears, nose, mouth, throat, and face: Denies mucositis or sore throat Respiratory: Denies cough, dyspnea or wheezes Cardiovascular: Denies palpitation, chest discomfort or lower extremity  swelling Gastrointestinal:  Denies nausea, heartburn or change in bowel habits Skin: Denies abnormal skin rashes Lymphatics: Denies new lymphadenopathy or easy bruising Neurological:Denies numbness, tingling or new weaknesses Behavioral/Psych: Mood is stable, no new changes  All other systems were reviewed with the patient and are negative.  PHYSICAL EXAMINATION: ECOG PERFORMANCE STATUS: 0  BP 133/71 (BP Location: Left Arm, Patient Position: Sitting)   Pulse 90   Temp 98.4 F (36.9 C) (Oral)   Resp 17   Ht _0  (1.626 m)   Wt 155 lb 8 oz (70.5 kg)   SpO2  100%   BMI 26.69 kg/m  GENERAL:alert, no distress and comfortable SKIN: skin color, texture, turgor are normal, no rashes or significant lesions EYES: normal, conjunctiva are pink and non-injected, sclera clear OROPHARYNX:no exudate, no erythema and lips, buccal mucosa, and tongue normal  NECK: supple, thyroid normal size, non-tender, without nodularity LYMPH:  no palpable lymphadenopathy in the cervical, axillary or inguinal LUNGS: clear to auscultation and percussion with normal breathing effort HEART: regular rate & rhythm and no murmurs and no lower extremity edema ABDOMEN:abdomen soft, non-tender and normal bowel sounds Musculoskeletal:no cyanosis of digits and no clubbing  PSYCH: alert & oriented x 3 with fluent speech NEURO: no focal motor/sensory deficits  LABORATORY DATA:  I have reviewed the data as listed CBC Latest Ref Rng & Units 10/03/2015 09/19/2015 09/05/2015  WBC 3.9 - 10.3 10e3/uL 6.1 4.8 3.9  Hemoglobin 11.6 - 15.9 g/dL 9.5(L) 9.4(L) 9.2(L)  Hematocrit 34.8 - 46.6 % 29.9(L) 29.5(L) 28.7(L)  Platelets 145 - 400 10e3/uL 114(L) 116(L) 97(L)   CMP Latest Ref Rng & Units 10/03/2015 09/19/2015 09/05/2015  Glucose 70 - 140 mg/dl 166(H) 116 98  BUN 7.0 - 26.0 mg/dL 18.8 33.0(H) 20.3  Creatinine 0.6 - 1.1 mg/dL 1.5(H) 1.4(H) 1.2(H)  Sodium 136 - 145 mEq/L 141 141 142  Potassium 3.5 - 5.1 mEq/L 4.6 5.2 No visable  hemolysis, recollected for confirmation(H) 4.1  Chloride 101 - 111 mmol/L - - -  CO2 22 - 29 mEq/L _0 Calcium 8.4 - 10.4 mg/dL 9.4 9.0 9.4  Total Protein 6.4 - 8.3 g/dL 7.1 6.6 6.9  Total Bilirubin 0.20 - 1.20 mg/dL <0.30 <0.30 <0.30  Alkaline Phos 40 - 150 U/L 110 112 112  AST 5 - 34 U/L 31 35(H) 28  ALT 0 - 55 U/L 29 36 23   Results for RUSHIE, BRAZEL (MRN [id number]) as of 10/03/2015 17:12  Ref. Range 05/02/2015 10:58 05/30/2015 08:22 06/27/2015 08:10 09/19/2015 07:36 09/19/2015 09:55  CEA Latest Ref Range: 0.0 - 4.7 ng/mL 20.8 (H) 21.4 (H) 24.7 (H)  37.9 (H)  CEA ([facility]) Latest Ref Range: 0.00 - 5.00 ng/mL    20.03 (H)     PATHOLOGY REPORTS: Diagnosis 07/25/2014  Surgical [P], sigmoid - INVASIVE ADENOCARCINOMA ARISING IN A BACKGROUND OF HIGH GRADE DYSPLASIA. - SEE COMMENT. Microscopic Comment Results are phoned to Dr. Olevia Perches. Internal departmental review obtained (Dr. Donato Heinz) with agreement.  Diagnosis 09/05/2014 Liver, biopsy - METASTATIC ADENOCARCINOMA, SEE COMMENT. Microscopic Comment Given the patient's history of sigmoid invasive adenocarcinoma and the morphology, the findings are consistent with metastatic colorectal adenocarcinoma. The case was called to Dr. Burr Medico. Per request, MMR, MSI, and KRAS testing will be ordered.  KRAS MUTATION (+) c36G>A (p. G12D)  RADIOGRAPHIC STUDIES: I have personally reviewed the radiological images as listed and agreed with the findings in the report.   CT chest, abdomen and pelvis with contrast 07/18/2015 IMPRESSION: 1. Multiple liver metastases are again noted. These are stable to minimally increased in size compared with previous exam. 2. Soft tissue nodule adjacent to the sigmoid colon is stable compared with previous exam. 3. Aortic atherosclerosis and coronary artery disease.    Colonoscopy 07/25/2014 Dr. Olevia Perches  ENDOSCOPIC IMPRESSION: Near circumferential medium mass was found in the sigmoid colon; multiple biopsies  were performed using cold forceps, partially obstructing mass consistent with carcinoma. Placement of endoclips and tattoo at the margins of the mass which extends from 18-25 cm from the anus  ASSESSMENT & PLAN: 60 year old African-American female, presented  with intermittent nausea vomiting and anemia.   1. Sigmoid colon cancer with liver metastasis, TxNxM1, stage IV, MSI stable, KRAS mutation(+) -I previously reviewed her CT scans, colonoscopy, and colon mass biopsy findings with patient and her husband in great details, images were reviewed in person  -I reviewed her liver biopsy results, which confirmed metastasis from colon cancer. -The natural history of metastatic colon cancer and treatment options were discussed with patient and her husband. Giving the diffuse metastasis in the liver, this is unfortunately incurable disease. I recommend systemic chemotherapy to control her metastatic disease, the goal of therapy is palliative and to prolong her life. -she is on first-line chemotherapy FOLFOX and Avastin every 2 weeks, tolerarting well. -her tumor MSI is stable, KRAS mutated, not a candidate for immunotherapy or EGFR inhibitor. -Her CEA has dropped significantly. I reviewed her restaging CT scan from 07/18/2015 which showed stable disease overall, slightly increased of a few liver lesions.  -I recommend her to continue current first-line chemotherapy given the stable disease overall and excellent tolerance. We discussed she might develop resistance to the chemotherapy in the near future, and I would consider switching to second line treatment.  -We'll continue follow-up her CEA monthly, which has slightly increased lately  -She would like to take chemotherapy break intermittently.  - Today's lab results were reviewed with her, mild thrombocytopenia, adequate for treatment. 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 -Her ferritin is normal, serum iron and saturation slightly low, TIBC low normal, I suggest her to take oral iron supplement, she tolerates well, we'll continue once daily, with orange juice or vitamin C  -will repeat her iron study 0n 06/13/2015 showed adequate iron level  3. DM, HTN -Continue follow-up with PCP - we discussed the potential impact of chemotherapy and premedication dexamethasone on her blood pressure and sugar,will  Monitor closely. Her BG has been well controlled lately  -Her blood pressure has been high lately, we'll repeat in the infusion room. I encouraged her to measure her blood pressure at home. We discussed Avastin can increase her blood pressure, need to be monitored closely. If SBP persistently high than 150, I'll consider adding on a new antihypertensive medication. She will follow-up with her primary care physician also.  4. AKI, proteinuria secondary to Avastin  -Cr has been around 1.1-1.3  will continue monitoring renal function  -I encourage her to drink more fluids, her Cr has slightly increased lately  -IV fluids as needed  5. Mild thrombocytopenia -Secondary to chemotherapy. -will monitor closely.  PLan -Lab reviewed, platelet 114K, mild anemia, adequate for treatment. Continue FOLFOX today, will hold avastin due to her significant proteinuria (2.2g in 24 hr) and slightly worsening renal function  -skip next cycle chemo in 2 weeks -I'll see her back in 4 weeks, lab and restaging CT CAP with contrast a few days before visit. If her GFR<50, will hold her iv contrast   All questions were answered. The patient knows to call the clinic with any problems, questions or concerns.  I spent 20 minutes counseling the patient face to face. The total time  spent in the appointment was 25 minutes and more than 50% was on counseling.  Truitt Merle, MD 10/03/2015

## 2015-10-03 NOTE — Telephone Encounter (Signed)
Message sent to chemo scheduler to add chemo. Avs report and schedule given per 10/03/15 los. °

## 2015-10-05 ENCOUNTER — Ambulatory Visit (HOSPITAL_BASED_OUTPATIENT_CLINIC_OR_DEPARTMENT_OTHER): Payer: BLUE CROSS/BLUE SHIELD

## 2015-10-05 VITALS — BP 147/79 | HR 95 | Temp 98.8°F | Resp 16

## 2015-10-05 DIAGNOSIS — C787 Secondary malignant neoplasm of liver and intrahepatic bile duct: Principal | ICD-10-CM

## 2015-10-05 DIAGNOSIS — Z452 Encounter for adjustment and management of vascular access device: Secondary | ICD-10-CM

## 2015-10-05 DIAGNOSIS — C189 Malignant neoplasm of colon, unspecified: Secondary | ICD-10-CM

## 2015-10-05 DIAGNOSIS — C187 Malignant neoplasm of sigmoid colon: Secondary | ICD-10-CM

## 2015-10-05 MED ORDER — HEPARIN SOD (PORK) LOCK FLUSH 100 UNIT/ML IV SOLN
500.0000 [IU] | Freq: Once | INTRAVENOUS | Status: AC | PRN
  Administered 2015-10-05: 500 [IU]
  Filled 2015-10-05: qty 5

## 2015-10-05 MED ORDER — SODIUM CHLORIDE 0.9 % IJ SOLN
10.0000 mL | INTRAMUSCULAR | Status: DC | PRN
  Administered 2015-10-05: 10 mL
  Filled 2015-10-05: qty 10

## 2015-10-29 ENCOUNTER — Ambulatory Visit (HOSPITAL_COMMUNITY)
Admission: RE | Admit: 2015-10-29 | Discharge: 2015-10-29 | Disposition: A | Discharge status: Home / Self Care | Payer: BLUE CROSS/BLUE SHIELD | Source: Ambulatory Visit | Attending: Hematology | Admitting: Hematology

## 2015-10-29 ENCOUNTER — Other Ambulatory Visit: Payer: Self-pay | Admitting: Hematology

## 2015-10-29 ENCOUNTER — Other Ambulatory Visit (HOSPITAL_BASED_OUTPATIENT_CLINIC_OR_DEPARTMENT_OTHER): Payer: BLUE CROSS/BLUE SHIELD

## 2015-10-29 DIAGNOSIS — C787 Secondary malignant neoplasm of liver and intrahepatic bile duct: Secondary | ICD-10-CM | POA: Diagnosis not present

## 2015-10-29 DIAGNOSIS — I708 Atherosclerosis of other arteries: Secondary | ICD-10-CM | POA: Diagnosis not present

## 2015-10-29 DIAGNOSIS — R918 Other nonspecific abnormal finding of lung field: Secondary | ICD-10-CM | POA: Diagnosis not present

## 2015-10-29 DIAGNOSIS — N3289 Other specified disorders of bladder: Secondary | ICD-10-CM | POA: Insufficient documentation

## 2015-10-29 DIAGNOSIS — I2510 Atherosclerotic heart disease of native coronary artery without angina pectoris: Secondary | ICD-10-CM | POA: Diagnosis not present

## 2015-10-29 DIAGNOSIS — C187 Malignant neoplasm of sigmoid colon: Secondary | ICD-10-CM

## 2015-10-29 DIAGNOSIS — K8020 Calculus of gallbladder without cholecystitis without obstruction: Secondary | ICD-10-CM | POA: Insufficient documentation

## 2015-10-29 DIAGNOSIS — K228 Other specified diseases of esophagus: Secondary | ICD-10-CM | POA: Diagnosis not present

## 2015-10-29 DIAGNOSIS — C189 Malignant neoplasm of colon, unspecified: Secondary | ICD-10-CM | POA: Diagnosis present

## 2015-10-29 DIAGNOSIS — I700 Atherosclerosis of aorta: Secondary | ICD-10-CM | POA: Insufficient documentation

## 2015-10-29 DIAGNOSIS — M5136 Other intervertebral disc degeneration, lumbar region: Secondary | ICD-10-CM | POA: Insufficient documentation

## 2015-10-29 LAB — CBC WITH DIFFERENTIAL/PLATELET
BASO%: 0.5 % (ref 0.0–2.0)
BASOS ABS: 0 10*3/uL (ref 0.0–0.1)
EOS%: 5.3 % (ref 0.0–7.0)
Eosinophils Absolute: 0.3 10*3/uL (ref 0.0–0.5)
HEMATOCRIT: 30.8 % — AB (ref 34.8–46.6)
HGB: 9.9 g/dL — ABNORMAL LOW (ref 11.6–15.9)
LYMPH#: 1.1 10*3/uL (ref 0.9–3.3)
LYMPH%: 20.1 % (ref 14.0–49.7)
MCH: 30 pg (ref 25.1–34.0)
MCHC: 32.1 g/dL (ref 31.5–36.0)
MCV: 93.3 fL (ref 79.5–101.0)
MONO#: 0.9 10*3/uL (ref 0.1–0.9)
MONO%: 16.8 % — AB (ref 0.0–14.0)
NEUT#: 3.1 10*3/uL (ref 1.5–6.5)
NEUT%: 57.3 % (ref 38.4–76.8)
Platelets: 144 10*3/uL — ABNORMAL LOW (ref 145–400)
RBC: 3.3 10*6/uL — AB (ref 3.70–5.45)
RDW: 15.7 % — ABNORMAL HIGH (ref 11.2–14.5)
WBC: 5.5 10*3/uL (ref 3.9–10.3)

## 2015-10-29 LAB — COMPREHENSIVE METABOLIC PANEL
ALT: 52 U/L (ref 0–55)
AST: 63 U/L — AB (ref 5–34)
Albumin: 3.5 g/dL (ref 3.5–5.0)
Alkaline Phosphatase: 134 U/L (ref 40–150)
Anion Gap: 10 mEq/L (ref 3–11)
BUN: 24.3 mg/dL (ref 7.0–26.0)
CALCIUM: 9.9 mg/dL (ref 8.4–10.4)
CHLORIDE: 105 meq/L (ref 98–109)
CO2: 21 meq/L — AB (ref 22–29)
CREATININE: 1.5 mg/dL — AB (ref 0.6–1.1)
EGFR: 43 mL/min/{1.73_m2} — ABNORMAL LOW (ref >90)
Glucose: 125 mg/dl (ref 70–140)
POTASSIUM: 5 meq/L (ref 3.5–5.1)
Sodium: 136 mEq/L (ref 136–145)
Total Bilirubin: 0.32 mg/dL (ref 0.20–1.20)
Total Protein: 7.5 g/dL (ref 6.4–8.3)

## 2015-10-29 LAB — CEA (IN HOUSE-CHCC): CEA (CHCC-IN HOUSE): 36.34 ng/mL — AB (ref 0.00–5.00)

## 2015-10-29 MED ORDER — IOPAMIDOL (ISOVUE-300) INJECTION 61%: 100.0000 mL | Freq: Once | INTRAVENOUS | Status: DC | PRN

## 2015-10-29 MED ORDER — IOPAMIDOL (ISOVUE-300) INJECTION 61%
30.0000 mL | Freq: Once | INTRAVENOUS | Status: AC | PRN
  Administered 2015-10-29: 30 mL via ORAL

## 2015-10-30 ENCOUNTER — Other Ambulatory Visit: Payer: Self-pay

## 2015-10-30 DIAGNOSIS — C787 Secondary malignant neoplasm of liver and intrahepatic bile duct: Principal | ICD-10-CM

## 2015-10-30 DIAGNOSIS — C189 Malignant neoplasm of colon, unspecified: Secondary | ICD-10-CM

## 2015-10-30 LAB — CEA: CEA1: 63.7 ng/mL — AB (ref 0.0–4.7)

## 2015-10-31 ENCOUNTER — Ambulatory Visit (HOSPITAL_BASED_OUTPATIENT_CLINIC_OR_DEPARTMENT_OTHER): Payer: BLUE CROSS/BLUE SHIELD | Admitting: Hematology

## 2015-10-31 ENCOUNTER — Telehealth: Payer: Self-pay | Admitting: Hematology

## 2015-10-31 ENCOUNTER — Ambulatory Visit (HOSPITAL_BASED_OUTPATIENT_CLINIC_OR_DEPARTMENT_OTHER): Payer: BLUE CROSS/BLUE SHIELD

## 2015-10-31 ENCOUNTER — Encounter: Payer: Self-pay | Admitting: *Deleted

## 2015-10-31 VITALS — BP 147/66

## 2015-10-31 VITALS — BP 149/66 | HR 87 | Temp 98.3°F | Resp 18 | Ht 64.0 in | Wt 159.7 lb

## 2015-10-31 DIAGNOSIS — R808 Other proteinuria: Secondary | ICD-10-CM

## 2015-10-31 DIAGNOSIS — C787 Secondary malignant neoplasm of liver and intrahepatic bile duct: Secondary | ICD-10-CM | POA: Diagnosis not present

## 2015-10-31 DIAGNOSIS — Z5111 Encounter for antineoplastic chemotherapy: Secondary | ICD-10-CM

## 2015-10-31 DIAGNOSIS — C187 Malignant neoplasm of sigmoid colon: Secondary | ICD-10-CM

## 2015-10-31 DIAGNOSIS — D630 Anemia in neoplastic disease: Secondary | ICD-10-CM

## 2015-10-31 DIAGNOSIS — E119 Type 2 diabetes mellitus without complications: Secondary | ICD-10-CM

## 2015-10-31 DIAGNOSIS — C189 Malignant neoplasm of colon, unspecified: Secondary | ICD-10-CM

## 2015-10-31 DIAGNOSIS — I10 Essential (primary) hypertension: Secondary | ICD-10-CM

## 2015-10-31 DIAGNOSIS — D6959 Other secondary thrombocytopenia: Secondary | ICD-10-CM

## 2015-10-31 LAB — UA PROTEIN, DIPSTICK - CHCC: Protein, ur: 100 mg/dL

## 2015-10-31 MED ORDER — LEUCOVORIN CALCIUM INJECTION 350 MG
400.0000 mg/m2 | Freq: Once | INTRAVENOUS | Status: AC
  Administered 2015-10-31: 716 mg via INTRAVENOUS
  Filled 2015-10-31: qty 35.8

## 2015-10-31 MED ORDER — FLUOROURACIL CHEMO INJECTION 5 GM/100ML
2200.0000 mg/m2 | INTRAVENOUS | Status: DC
  Administered 2015-10-31: 3950 mg via INTRAVENOUS
  Filled 2015-10-31: qty 79

## 2015-10-31 MED ORDER — SODIUM CHLORIDE 0.9 % IV SOLN
5.0000 mg/kg | Freq: Once | INTRAVENOUS | Status: AC
  Administered 2015-10-31: 350 mg via INTRAVENOUS
  Filled 2015-10-31: qty 14

## 2015-10-31 MED ORDER — OXALIPLATIN CHEMO INJECTION 100 MG/20ML
70.0000 mg/m2 | Freq: Once | INTRAVENOUS | Status: AC
  Administered 2015-10-31: 125 mg via INTRAVENOUS
  Filled 2015-10-31: qty 20

## 2015-10-31 MED ORDER — SODIUM CHLORIDE 0.9 % IV SOLN
Freq: Once | INTRAVENOUS | Status: AC
  Administered 2015-10-31: 10:00:00 via INTRAVENOUS
  Filled 2015-10-31: qty 4

## 2015-10-31 MED ORDER — SODIUM CHLORIDE 0.9 % IJ SOLN
10.0000 mL | INTRAMUSCULAR | Status: DC | PRN
  Filled 2015-10-31: qty 10

## 2015-10-31 MED ORDER — HEPARIN SOD (PORK) LOCK FLUSH 100 UNIT/ML IV SOLN
500.0000 [IU] | Freq: Once | INTRAVENOUS | Status: DC | PRN
  Filled 2015-10-31: qty 5

## 2015-10-31 MED ORDER — DEXTROSE 5 % IV SOLN
Freq: Once | INTRAVENOUS | Status: AC
  Administered 2015-10-31: 13:00:00 via INTRAVENOUS

## 2015-10-31 MED ORDER — SODIUM CHLORIDE 0.9 % IV SOLN
Freq: Once | INTRAVENOUS | Status: AC
  Administered 2015-10-31: 10:00:00 via INTRAVENOUS

## 2015-10-31 MED ORDER — INFLUENZA VAC SPLIT QUAD 0.5 ML IM SUSY
0.5000 mL | PREFILLED_SYRINGE | Freq: Once | INTRAMUSCULAR | Status: AC
  Administered 2015-10-31: 0.5 mL via INTRAMUSCULAR
  Filled 2015-10-31: qty 0.5

## 2015-10-31 NOTE — Telephone Encounter (Signed)
Gave  Patient avs report and appointments for October and November

## 2015-10-31 NOTE — Progress Notes (Signed)
[facility]  [phone] [phone]  Clinic Follow Up Note   Patient Care Team: Dione Housekeeper, MD as PCP - General (Family Medicine) Tania Ade, RN as Registered Nurse (Oncology) 10/31/2015  CHIEF COMPLAINTS:  Follow-up of metastatic colon cancer  Oncology History   Metastatic colon cancer to liver   Staging form: Colon and Rectum, AJCC 7th Edition     Clinical: Stage Unknown ([place], NX, M1) - Unsigned        Metastatic colon cancer to liver ([place])   07/14/2014 Imaging    CT abdomen and pelvis with contrast showed a 6 cm segment of sigmoid bowel wall thickening with adjacent 14 x 12 mm nodules, diffuse hepatic metastasis. No bowel obstruction.      07/14/2014 Initial Diagnosis    Metastatic colon cancer to liver      07/25/2014 Procedure    Colonoscopy showed a near circumferential medial mass in the sigmoid colon, partially obstructing consistent with carcinoma.      07/25/2014 Initial Biopsy    Sigmoid: Biopsy showed invasive adenocarcinoma arising in a background of high-grade dysplasia.      07/28/2014 Imaging    CT chest showed no evidence of metastasis      08/09/2014 -  Chemotherapy    mFOLFOX6 every 2 weeks, dose reduction from cycle 7 due to cytopenia, Avastin added from cycle 3      08/11/2014 - 08/15/2014 Hospital Admission    Pt was admitted for fever and nausea, treated for UTI, liver biopsy cancelled due to the infection        HISTORY OF PRESENTING ILLNESS:  Michelle Erickson 60 y.o. female is here because of abnoaml CT findings which is highly suspecious for metastatic colon cancer.   She presented intermittent nausea, anorexia and abdominal pain since Nov 2015, she has mild pain in the LUQ, crapy pain, positional (bending over),   it happened once every 2-3 weeks, and it has been more frequent in the past month. She has normal BM, but did notice the caliber of the stool is  smaller than before. She denies any melena or hematochezia. She lost about 13 lbs in the past 6 months.   She presents to local emergency room in November 2015, and May 2016, was felt to be related to gastric virus. Due to the worsening symptoms lately, she went to [facility] ED on 07/13/2014, CT abdomen was obtained which showed multiple large liver metastasis and probable sigmoid colon mass. She was referred to [place] for further workup. She is scheduled to see GI Dr. Olevia Perches this afternoon.  CURRENT THERAPY: mFOLFOX6 and Avastin every 2 weeks, started on 08/09/2014   INTERIM HISTORY Michelle Erickson returns for follow-up and chemo. She is accompanied by her husband to the clinic today. She had 4 weeks of chemo break, and feels very well overall. She has better energy and appetite, enjoyed her time at her church, and other activities. She denies any significant pain, or other symptoms. She is here to discuss her restaging scans and to restart chemotherapy.  MEDICAL HISTORY Past Medical History:  Diagnosis Date  . Back pain   . Diabetes mellitus without complication ([place])   . Family history of colon cancer   . Hyperlipemia   . Hypertension   . Metastatic colon cancer to liver ([place]) 07/21/2014  . Snoring   . Wears glasses    contacts/glasses    SURGICAL HISTORY: Past Surgical History:  Procedure Laterality Date  .  CATARACT EXTRACTION Bilateral   . MOUTH SURGERY    . PORTACATH PLACEMENT N/A 08/03/2014   Procedure: INSERTION PORT-A-CATH;  Surgeon: Stark Klein, MD;  Location: [facility];  Service: General;  Laterality: N/A;    SOCIAL HISTORY: Social History   Social History  . Marital status: Married    Spouse name: Charlotte Crumb  . Number of children: 2  . Years of education: N/A   Occupational History  . Not on file.   Social History Main Topics  . Smoking status: Former Smoker    Packs/day: 1.00    Years: 6.00    Types: Cigarettes    Quit date: 01/21/1976  . Smokeless tobacco: Never Used    . Alcohol use No  . Drug use: No  . Sexual activity: Yes    Birth control/ protection: Post-menopausal     Comment: # 2 pregnancies and #2 live births   Other Topics Concern  . Not on file   Social History Narrative   Married, husband [name]   Has #2 adult children   Previously worked at [employer] HISTORY: Family History  Problem Relation Age of Onset  . COPD Father   . Heart attack Father   . Arthritis Sister   . Diabetes Sister   . Colon cancer Sister     dx over 54  . Colon polyps Sister   . Seizures Brother   . Colon cancer Sister     dx in her early 82s, died in her early to mid 32s  . Diabetes Mother     ALLERGIES:  has No Known Allergies.  MEDICATIONS:  Current Outpatient Prescriptions  Medication Sig Dispense Refill  . amLODipine (NORVASC) 5 MG tablet Take 2 tablets (10 mg total) by mouth daily. 30 tablet 0  . atorvastatin (LIPITOR) 40 MG tablet Take by mouth.    . cetirizine (ZYRTEC) 10 MG tablet Take 10 mg by mouth daily as needed for allergies. Reported on 05/02/2015    . ferrous sulfate 325 (65 FE) MG tablet Take 325 mg by mouth daily with breakfast.    . Insulin Glargine (LANTUS SOLOSTAR) 100 UNIT/ML Solostar Pen Inject 23 Units into the skin daily at 10 pm.    . insulin lispro (HUMALOG) 100 UNIT/ML injection Inject 4-8 Units into the skin 2 (two) times daily as needed for high blood sugar. Sliding scale    . lidocaine-prilocaine (EMLA) cream     . ondansetron (ZOFRAN-ODT) 8 MG disintegrating tablet Take 8 mg by mouth every 8 (eight) hours as needed for nausea or vomiting. Reported on 05/02/2015     No current facility-administered medications for this visit.     REVIEW OF SYSTEMS:   Constitutional: Denies fevers, chills or abnormal night sweats Eyes: Denies blurriness of vision, double vision or watery eyes Ears, nose, mouth, throat, and face: Denies mucositis or sore throat Respiratory: Denies cough, dyspnea or  wheezes Cardiovascular: Denies palpitation, chest discomfort or lower extremity swelling Gastrointestinal:  Denies nausea, heartburn or change in bowel habits Skin: Denies abnormal skin rashes Lymphatics: Denies new lymphadenopathy or easy bruising Neurological:Denies numbness, tingling or new weaknesses Behavioral/Psych: Mood is stable, no new changes  All other systems were reviewed with the patient and are negative.  PHYSICAL EXAMINATION: ECOG PERFORMANCE STATUS: 0  BP (!) 149/66 (BP Location: Left Arm, Patient Position: Sitting)   Pulse 87   Temp 98.3 F (36.8 C) (Oral)   Resp 18   Ht _0  (  1.626 m)   Wt 159 lb 11.2 oz (72.4 kg)   SpO2 100%   BMI 27.41 kg/m  GENERAL:alert, no distress and comfortable SKIN: skin color, texture, turgor are normal, no rashes or significant lesions EYES: normal, conjunctiva are pink and non-injected, sclera clear OROPHARYNX:no exudate, no erythema and lips, buccal mucosa, and tongue normal  NECK: supple, thyroid normal size, non-tender, without nodularity LYMPH:  no palpable lymphadenopathy in the cervical, axillary or inguinal LUNGS: clear to auscultation and percussion with normal breathing effort HEART: regular rate & rhythm and no murmurs and no lower extremity edema ABDOMEN:abdomen soft, non-tender and normal bowel sounds Musculoskeletal:no cyanosis of digits and no clubbing  PSYCH: alert & oriented x 3 with fluent speech NEURO: no focal motor/sensory deficits  LABORATORY DATA:  I have reviewed the data as listed CBC Latest Ref Rng & Units 10/29/2015 10/03/2015 09/19/2015  WBC 3.9 - 10.3 10e3/uL 5.5 6.1 4.8  Hemoglobin 11.6 - 15.9 g/dL 9.9(L) 9.5(L) 9.4(L)  Hematocrit 34.8 - 46.6 % 30.8(L) 29.9(L) 29.5(L)  Platelets 145 - 400 10e3/uL 144(L) 114(L) 116(L)   CMP Latest Ref Rng & Units 10/29/2015 10/03/2015 09/19/2015  Glucose 70 - 140 mg/dl 125 166(H) 116  BUN 7.0 - 26.0 mg/dL 24.3 18.8 33.0(H)  Creatinine 0.6 - 1.1 mg/dL 1.5(H) 1.5(H)  1.4(H)  Sodium 136 - 145 mEq/L 136 141 141  Potassium 3.5 - 5.1 mEq/L 5.0 4.6 5.2 No visable hemolysis, recollected for confirmation(H)  Chloride 101 - 111 mmol/L - - -  CO2 22 - 29 mEq/L 21(L) 24 25  Calcium 8.4 - 10.4 mg/dL 9.9 9.4 9.0  Total Protein 6.4 - 8.3 g/dL 7.5 7.1 6.6  Total Bilirubin 0.20 - 1.20 mg/dL 0.32 <0.30 <0.30  Alkaline Phos 40 - 150 U/L 134 110 112  AST 5 - 34 U/L 63(H) 31 35(H)  ALT 0 - 55 U/L 52 29 36   Results for GUELDA, BATSON (MRN [id number]) as of 10/31/2015 06:37  Ref. Range 06/27/2015 08:10 09/19/2015 07:36 09/19/2015 09:55 10/29/2015 09:54 10/29/2015 10:12  CEA Latest Ref Range: 0.0 - 4.7 ng/mL 24.7 (H)  37.9 (H)  63.7 (H)  CEA ([facility]) Latest Ref Range: 0.00 - 5.00 ng/mL  20.03 (H)  36.34 (H)    PATHOLOGY REPORTS: Diagnosis 07/25/2014  Surgical [P], sigmoid - INVASIVE ADENOCARCINOMA ARISING IN A BACKGROUND OF HIGH GRADE DYSPLASIA. - SEE COMMENT. Microscopic Comment Results are phoned to Dr. Olevia Perches. Internal departmental review obtained (Dr. Donato Heinz) with agreement.  Diagnosis 09/05/2014 Liver, biopsy - METASTATIC ADENOCARCINOMA, SEE COMMENT. Microscopic Comment Given the patient's history of sigmoid invasive adenocarcinoma and the morphology, the findings are consistent with metastatic colorectal adenocarcinoma. The case was called to Dr. Burr Medico. Per request, MMR, MSI, and KRAS testing will be ordered.  KRAS MUTATION (+) c36G>A (p. G12D)  RADIOGRAPHIC STUDIES: I have personally reviewed the radiological images as listed and agreed with the findings in the report.   CT chest, abdomen and pelvis with contrast 10/29/2015 IMPRESSION: 1. New/enlarging pulmonary nodules, most notably the 8 by 6 mm left lower lobe pulmonary nodule, favoring metastatic disease. 2. The liver lesions are subjectively grossly similar in size, difficult to measure due to the lack of IV contrast causing poor boundaries. 3. There is some faint heterogeneity of bony density in  the sternum, but this is not significantly changed from 07/28/2014 and is probably incidental given the lack of other evidence of osseous metastatic disease. 4. Lower lumbar spondylosis and degenerative disc disease causing impingement. 5. Coronary,  aortic arch, and branch vessel atherosclerotic vascular disease. Aortoiliac atherosclerotic vascular disease. 6. Mildly dilated esophagus with air-fluid level, probably reflecting dysmotility. 7. Cholelithiasis. 8. Thick wall urinary bladder, cystitis not excluded.   Colonoscopy 07/25/2014 Dr. Olevia Perches  ENDOSCOPIC IMPRESSION: Near circumferential medium mass was found in the sigmoid colon; multiple biopsies were performed using cold forceps, partially obstructing mass consistent with carcinoma. Placement of endoclips and tattoo at the margins of the mass which extends from 18-25 cm from the anus  ASSESSMENT & PLAN: 60 year old African-American female, presented with intermittent nausea vomiting and anemia.   1. Sigmoid colon cancer with liver metastasis, TxNxM1, stage IV, MSI stable, KRAS mutation(+) -I previously reviewed her CT scans, colonoscopy, and colon mass biopsy findings with patient and her husband in great details, images were reviewed in person  -I reviewed her liver biopsy results, which confirmed metastasis from colon cancer. -The natural history of metastatic colon cancer and treatment options were discussed with patient and her husband. Giving the diffuse metastasis in the liver, this is unfortunately incurable disease. I recommend systemic chemotherapy to control her metastatic disease, the goal of therapy is palliative and to prolong her life. -she is on first-line chemotherapy FOLFOX and Avastin every 2 weeks, tolerarting well. -her tumor MSI is stable, KRAS mutated, not a candidate for immunotherapy or EGFR inhibitor. -Her CEA has dropped significantly. I reviewed her restaging CT scan from 07/18/2015 which showed stable disease  overall, slightly increased of a few liver lesions.  -I recommend her to continue current first-line chemotherapy given the stable disease overall and excellent tolerance. We discussed she might develop resistance to the chemotherapy in the near future, and I would consider switching to second line treatment.  -We'll continue follow-up her CEA monthly, which has slightly increased lately  -She has taken two chemotherapy break lately -I reviewed her restaging CT scan images with patient and her husband, due to the lack of contrast (renal insufficiency), it is difficult to compare, but her liver disease overall appears to be stable. However she has developed a few Lung nodules, with the largest one 8 mm in the left lower lobe, concerning for new metastasis. This could be related to her chemotherapy break, or earlier disease progression  -Given the overall stable disease, I recommend her to restart FOLFOX and Avastin, without chemotherapy break for the next 2-3 months, and repeat staging scan. She agrees. -Lab reviewed, adequate for treatment, we'll restart FOLFOX and Avastin today, her urine protein has significantly decreased today.  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 -Her ferritin is normal, serum iron and saturation slightly low, TIBC low normal, I suggest her to take oral iron supplement, she tolerates well, we'll continue once daily, with orange juice or vitamin C  -will repeat her iron study 0n 06/13/2015 showed adequate iron level  3. DM, HTN -Continue follow-up with PCP - we discussed the potential impact of chemotherapy and premedication dexamethasone on her blood pressure and sugar,will  Monitor closely. Her BG has been well controlled lately  -Her blood pressure has been high lately, we'll repeat in the  infusion room. I encouraged her to measure her blood pressure at home. We discussed Avastin can increase her blood pressure, need to be monitored closely. If SBP persistently high than 150, I'll consider adding on a new antihypertensive medication. She will follow-up  with her primary care physician also.  4. AKI, proteinuria secondary to Avastin  -Cr has been around 1.1-1.3  will continue monitoring renal function  -I encourage her to drink more fluids, her Cr has slightly increased lately  -Her urine protein has decreased, 100 mg/dl today, ok to restart Avastin   5. Mild thrombocytopenia -Secondary to chemotherapy. -will monitor closely.  PLan -Lab reviewed, platelet 144K, mild anemia, adequate for treatment. Continue FOLFOX today, will restart Avastin, continue chemo every 2 weeks -I'll see her back in 4 weeks  All questions were answered. The patient knows to call the clinic with any problems, questions or concerns.  I spent 20 minutes counseling the patient face to face. The total time spent in the appointment was 25 minutes and more than 50% was on counseling.     Truitt Merle, MD 10/31/2015

## 2015-10-31 NOTE — Progress Notes (Signed)
Okay to treat with Avastin today with urine protein of 100 per Dr. Burr Medico.

## 2015-10-31 NOTE — Patient Instructions (Addendum)
[facility] Discharge Instructions for Patients Receiving Chemotherapy  Today you received the following chemotherapy agents Leucovorin, Oxaliplatin and Adrucil, and Avastin To help prevent nausea and vomiting after your treatment, we encourage you to take your nausea medication as directed.    If you develop nausea and vomiting that is not controlled by your nausea medication, call the clinic.   BELOW ARE SYMPTOMS THAT SHOULD BE REPORTED IMMEDIATELY:  *FEVER GREATER THAN 100.5 F  *CHILLS WITH OR WITHOUT FEVER  NAUSEA AND VOMITING THAT IS NOT CONTROLLED WITH YOUR NAUSEA MEDICATION  *UNUSUAL SHORTNESS OF BREATH  *UNUSUAL BRUISING OR BLEEDING  TENDERNESS IN MOUTH AND THROAT WITH OR WITHOUT PRESENCE OF ULCERS  *URINARY PROBLEMS  *BOWEL PROBLEMS  UNUSUAL RASH Items with * indicate a potential emergency and should be followed up as soon as possible.  Feel free to call the clinic you have any questions or concerns. The clinic phone number is [phone].  Please show the [facility] at check-in to the Emergency Department and triage nurse.

## 2015-10-31 NOTE — Progress Notes (Signed)
Oncology Nurse Navigator Documentation  Oncology Nurse Navigator Flowsheets 10/31/2015  Navigator Location [facility]  Navigator Encounter Type Treatment  Patient Visit Type MedOnc  Treatment Phase Active Tx--FOLFOX/Avastin  Barriers/Navigation Needs No barriers at this time;No Questions;No Needs  Interventions None required  Support Groups/Services -Declines any support group information at this time  Acuity -  Time Spent with Patient 15  Time Spent with Patient (Retired) -

## 2015-11-01 ENCOUNTER — Encounter: Payer: Self-pay | Admitting: Hematology

## 2015-11-02 ENCOUNTER — Ambulatory Visit (HOSPITAL_BASED_OUTPATIENT_CLINIC_OR_DEPARTMENT_OTHER): Payer: BLUE CROSS/BLUE SHIELD

## 2015-11-02 VITALS — BP 142/63 | HR 87 | Temp 98.2°F | Resp 18

## 2015-11-02 DIAGNOSIS — C187 Malignant neoplasm of sigmoid colon: Secondary | ICD-10-CM | POA: Diagnosis not present

## 2015-11-02 DIAGNOSIS — C189 Malignant neoplasm of colon, unspecified: Secondary | ICD-10-CM

## 2015-11-02 DIAGNOSIS — Z452 Encounter for adjustment and management of vascular access device: Secondary | ICD-10-CM

## 2015-11-02 DIAGNOSIS — C787 Secondary malignant neoplasm of liver and intrahepatic bile duct: Principal | ICD-10-CM

## 2015-11-02 MED ORDER — SODIUM CHLORIDE 0.9 % IJ SOLN
10.0000 mL | INTRAMUSCULAR | Status: DC | PRN
  Administered 2015-11-02: 10 mL
  Filled 2015-11-02: qty 10

## 2015-11-02 MED ORDER — HEPARIN SOD (PORK) LOCK FLUSH 100 UNIT/ML IV SOLN
500.0000 [IU] | Freq: Once | INTRAVENOUS | Status: AC | PRN
  Administered 2015-11-02: 500 [IU]
  Filled 2015-11-02: qty 5

## 2015-11-14 ENCOUNTER — Other Ambulatory Visit (HOSPITAL_BASED_OUTPATIENT_CLINIC_OR_DEPARTMENT_OTHER): Payer: BLUE CROSS/BLUE SHIELD

## 2015-11-14 ENCOUNTER — Ambulatory Visit (HOSPITAL_BASED_OUTPATIENT_CLINIC_OR_DEPARTMENT_OTHER): Payer: BLUE CROSS/BLUE SHIELD

## 2015-11-14 VITALS — BP 177/76 | HR 87 | Temp 98.1°F | Resp 16

## 2015-11-14 DIAGNOSIS — C189 Malignant neoplasm of colon, unspecified: Secondary | ICD-10-CM

## 2015-11-14 DIAGNOSIS — Z5111 Encounter for antineoplastic chemotherapy: Secondary | ICD-10-CM | POA: Diagnosis not present

## 2015-11-14 DIAGNOSIS — C187 Malignant neoplasm of sigmoid colon: Secondary | ICD-10-CM | POA: Diagnosis not present

## 2015-11-14 DIAGNOSIS — Z5112 Encounter for antineoplastic immunotherapy: Secondary | ICD-10-CM | POA: Diagnosis not present

## 2015-11-14 DIAGNOSIS — C787 Secondary malignant neoplasm of liver and intrahepatic bile duct: Secondary | ICD-10-CM

## 2015-11-14 LAB — COMPREHENSIVE METABOLIC PANEL
ALK PHOS: 136 U/L (ref 40–150)
ALT: 48 U/L (ref 0–55)
ANION GAP: 7 meq/L (ref 3–11)
AST: 43 U/L — ABNORMAL HIGH (ref 5–34)
Albumin: 3.2 g/dL — ABNORMAL LOW (ref 3.5–5.0)
BILIRUBIN TOTAL: 0.22 mg/dL (ref 0.20–1.20)
BUN: 18.2 mg/dL (ref 7.0–26.0)
CALCIUM: 9.4 mg/dL (ref 8.4–10.4)
CO2: 24 meq/L (ref 22–29)
CREATININE: 1.3 mg/dL — AB (ref 0.6–1.1)
Chloride: 107 mEq/L (ref 98–109)
EGFR: 52 mL/min/{1.73_m2} — AB (ref >90)
Glucose: 167 mg/dl — ABNORMAL HIGH (ref 70–140)
Potassium: 4.7 mEq/L (ref 3.5–5.1)
Sodium: 139 mEq/L (ref 136–145)
TOTAL PROTEIN: 7.1 g/dL (ref 6.4–8.3)

## 2015-11-14 LAB — CBC WITH DIFFERENTIAL/PLATELET
BASO%: 0.5 % (ref 0.0–2.0)
Basophils Absolute: 0 10*3/uL (ref 0.0–0.1)
EOS ABS: 0.2 10*3/uL (ref 0.0–0.5)
EOS%: 5 % (ref 0.0–7.0)
HEMATOCRIT: 28.1 % — AB (ref 34.8–46.6)
HGB: 9 g/dL — ABNORMAL LOW (ref 11.6–15.9)
LYMPH#: 0.9 10*3/uL (ref 0.9–3.3)
LYMPH%: 19.9 % (ref 14.0–49.7)
MCH: 29.9 pg (ref 25.1–34.0)
MCHC: 31.9 g/dL (ref 31.5–36.0)
MCV: 93.7 fL (ref 79.5–101.0)
MONO#: 0.5 10*3/uL (ref 0.1–0.9)
MONO%: 11.8 % (ref 0.0–14.0)
NEUT%: 62.8 % (ref 38.4–76.8)
NEUTROS ABS: 2.7 10*3/uL (ref 1.5–6.5)
PLATELETS: 105 10*3/uL — AB (ref 145–400)
RBC: 3 10*6/uL — AB (ref 3.70–5.45)
RDW: 16.8 % — ABNORMAL HIGH (ref 11.2–14.5)
WBC: 4.3 10*3/uL (ref 3.9–10.3)

## 2015-11-14 MED ORDER — ONDANSETRON HCL 8 MG PO TABS
ORAL_TABLET | ORAL | Status: AC
  Filled 2015-11-14: qty 1

## 2015-11-14 MED ORDER — OXALIPLATIN CHEMO INJECTION 100 MG/20ML
70.0000 mg/m2 | Freq: Once | INTRAVENOUS | Status: AC
  Administered 2015-11-14: 125 mg via INTRAVENOUS
  Filled 2015-11-14: qty 15

## 2015-11-14 MED ORDER — SODIUM CHLORIDE 0.9 % IV SOLN
Freq: Once | INTRAVENOUS | Status: AC
Start: 2015-11-14 — End: 2015-11-14
  Administered 2015-11-14: 11:00:00 via INTRAVENOUS

## 2015-11-14 MED ORDER — LEUCOVORIN CALCIUM INJECTION 350 MG
400.0000 mg/m2 | Freq: Once | INTRAVENOUS | Status: AC
  Administered 2015-11-14: 716 mg via INTRAVENOUS
  Filled 2015-11-14: qty 35.8

## 2015-11-14 MED ORDER — SODIUM CHLORIDE 0.9 % IV SOLN
5.0000 mg/kg | Freq: Once | INTRAVENOUS | Status: AC
  Administered 2015-11-14: 350 mg via INTRAVENOUS
  Filled 2015-11-14: qty 14

## 2015-11-14 MED ORDER — DEXAMETHASONE SODIUM PHOSPHATE 10 MG/ML IJ SOLN
10.0000 mg | Freq: Once | INTRAMUSCULAR | Status: AC
  Administered 2015-11-14: 10 mg via INTRAVENOUS

## 2015-11-14 MED ORDER — DEXAMETHASONE SODIUM PHOSPHATE 10 MG/ML IJ SOLN
INTRAMUSCULAR | Status: AC
  Filled 2015-11-14: qty 1

## 2015-11-14 MED ORDER — SODIUM CHLORIDE 0.9 % IV SOLN
2200.0000 mg/m2 | INTRAVENOUS | Status: DC
  Administered 2015-11-14: 3950 mg via INTRAVENOUS
  Filled 2015-11-14: qty 79

## 2015-11-14 MED ORDER — HEPARIN SOD (PORK) LOCK FLUSH 100 UNIT/ML IV SOLN
500.0000 [IU] | Freq: Once | INTRAVENOUS | Status: DC | PRN
  Filled 2015-11-14: qty 5

## 2015-11-14 MED ORDER — SODIUM CHLORIDE 0.9 % IJ SOLN
10.0000 mL | INTRAMUSCULAR | Status: DC | PRN
  Filled 2015-11-14: qty 10

## 2015-11-14 MED ORDER — ONDANSETRON HCL 8 MG PO TABS
8.0000 mg | ORAL_TABLET | Freq: Once | ORAL | Status: AC
  Administered 2015-11-14: 8 mg via ORAL

## 2015-11-14 MED ORDER — DEXTROSE 5 % IV SOLN
Freq: Once | INTRAVENOUS | Status: AC
  Administered 2015-11-14: 11:00:00 via INTRAVENOUS

## 2015-11-14 NOTE — Patient Instructions (Signed)
[facility] Discharge Instructions for Patients Receiving Chemotherapy  Today you received the following chemotherapy agents: Oxaliplatin, Leucovorin and Adrucil.   To help prevent nausea and vomiting after your treatment, we encourage you to take your nausea medication as directed.  If you develop nausea and vomiting that is not controlled by your nausea medication, call the clinic.   BELOW ARE SYMPTOMS THAT SHOULD BE REPORTED IMMEDIATELY:  *FEVER GREATER THAN 100.5 F  *CHILLS WITH OR WITHOUT FEVER  NAUSEA AND VOMITING THAT IS NOT CONTROLLED WITH YOUR NAUSEA MEDICATION  *UNUSUAL SHORTNESS OF BREATH  *UNUSUAL BRUISING OR BLEEDING  TENDERNESS IN MOUTH AND THROAT WITH OR WITHOUT PRESENCE OF ULCERS  *URINARY PROBLEMS  *BOWEL PROBLEMS  UNUSUAL RASH Items with * indicate a potential emergency and should be followed up as soon as possible.  Feel free to call the clinic you have any questions or concerns. The clinic phone number is [phone].  Please show the CHEMO ALERT CARD at check-in to the Emergency Department and triage nurse.   

## 2015-11-16 ENCOUNTER — Ambulatory Visit (HOSPITAL_BASED_OUTPATIENT_CLINIC_OR_DEPARTMENT_OTHER): Payer: BLUE CROSS/BLUE SHIELD

## 2015-11-16 DIAGNOSIS — C187 Malignant neoplasm of sigmoid colon: Secondary | ICD-10-CM | POA: Diagnosis not present

## 2015-11-16 DIAGNOSIS — C787 Secondary malignant neoplasm of liver and intrahepatic bile duct: Principal | ICD-10-CM

## 2015-11-16 DIAGNOSIS — C189 Malignant neoplasm of colon, unspecified: Secondary | ICD-10-CM

## 2015-11-16 DIAGNOSIS — Z452 Encounter for adjustment and management of vascular access device: Secondary | ICD-10-CM | POA: Diagnosis not present

## 2015-11-16 MED ORDER — SODIUM CHLORIDE 0.9 % IJ SOLN
10.0000 mL | INTRAMUSCULAR | Status: DC | PRN
  Administered 2015-11-16: 10 mL
  Filled 2015-11-16: qty 10

## 2015-11-16 MED ORDER — HEPARIN SOD (PORK) LOCK FLUSH 100 UNIT/ML IV SOLN
500.0000 [IU] | Freq: Once | INTRAVENOUS | Status: AC | PRN
Start: 2015-11-16 — End: 2015-11-16
  Administered 2015-11-16: 500 [IU]
  Filled 2015-11-16: qty 5

## 2015-11-27 ENCOUNTER — Other Ambulatory Visit: Payer: Self-pay

## 2015-11-27 DIAGNOSIS — C189 Malignant neoplasm of colon, unspecified: Secondary | ICD-10-CM

## 2015-11-27 DIAGNOSIS — C787 Secondary malignant neoplasm of liver and intrahepatic bile duct: Principal | ICD-10-CM

## 2015-11-28 ENCOUNTER — Telehealth: Payer: Self-pay | Admitting: Hematology

## 2015-11-28 ENCOUNTER — Ambulatory Visit (HOSPITAL_BASED_OUTPATIENT_CLINIC_OR_DEPARTMENT_OTHER): Payer: BLUE CROSS/BLUE SHIELD

## 2015-11-28 ENCOUNTER — Ambulatory Visit (HOSPITAL_BASED_OUTPATIENT_CLINIC_OR_DEPARTMENT_OTHER): Payer: BLUE CROSS/BLUE SHIELD | Admitting: Hematology

## 2015-11-28 ENCOUNTER — Other Ambulatory Visit (HOSPITAL_BASED_OUTPATIENT_CLINIC_OR_DEPARTMENT_OTHER): Payer: BLUE CROSS/BLUE SHIELD

## 2015-11-28 VITALS — BP 150/48 | HR 90 | Temp 98.5°F | Resp 18 | Ht 64.0 in | Wt 160.3 lb

## 2015-11-28 DIAGNOSIS — D630 Anemia in neoplastic disease: Secondary | ICD-10-CM | POA: Diagnosis not present

## 2015-11-28 DIAGNOSIS — C187 Malignant neoplasm of sigmoid colon: Secondary | ICD-10-CM

## 2015-11-28 DIAGNOSIS — C189 Malignant neoplasm of colon, unspecified: Secondary | ICD-10-CM

## 2015-11-28 DIAGNOSIS — I10 Essential (primary) hypertension: Secondary | ICD-10-CM

## 2015-11-28 DIAGNOSIS — C787 Secondary malignant neoplasm of liver and intrahepatic bile duct: Secondary | ICD-10-CM

## 2015-11-28 DIAGNOSIS — Z452 Encounter for adjustment and management of vascular access device: Secondary | ICD-10-CM | POA: Diagnosis not present

## 2015-11-28 DIAGNOSIS — Z5111 Encounter for antineoplastic chemotherapy: Secondary | ICD-10-CM | POA: Diagnosis not present

## 2015-11-28 DIAGNOSIS — E119 Type 2 diabetes mellitus without complications: Secondary | ICD-10-CM

## 2015-11-28 DIAGNOSIS — Z95828 Presence of other vascular implants and grafts: Secondary | ICD-10-CM

## 2015-11-28 DIAGNOSIS — D6959 Other secondary thrombocytopenia: Secondary | ICD-10-CM

## 2015-11-28 LAB — CBC WITH DIFFERENTIAL/PLATELET
BASO%: 0.6 % (ref 0.0–2.0)
BASOS ABS: 0 10*3/uL (ref 0.0–0.1)
EOS%: 2.6 % (ref 0.0–7.0)
Eosinophils Absolute: 0.1 10*3/uL (ref 0.0–0.5)
HCT: 28 % — ABNORMAL LOW (ref 34.8–46.6)
HEMOGLOBIN: 9 g/dL — AB (ref 11.6–15.9)
LYMPH%: 20.1 % (ref 14.0–49.7)
MCH: 30.2 pg (ref 25.1–34.0)
MCHC: 32.1 g/dL (ref 31.5–36.0)
MCV: 94 fL (ref 79.5–101.0)
MONO#: 0.6 10*3/uL (ref 0.1–0.9)
MONO%: 12.2 % (ref 0.0–14.0)
NEUT#: 3.3 10*3/uL (ref 1.5–6.5)
NEUT%: 64.5 % (ref 38.4–76.8)
Platelets: 131 10*3/uL — ABNORMAL LOW (ref 145–400)
RBC: 2.98 10*6/uL — AB (ref 3.70–5.45)
RDW: 16.7 % — AB (ref 11.2–14.5)
WBC: 5.1 10*3/uL (ref 3.9–10.3)
lymph#: 1 10*3/uL (ref 0.9–3.3)

## 2015-11-28 LAB — COMPREHENSIVE METABOLIC PANEL
ALBUMIN: 3.3 g/dL — AB (ref 3.5–5.0)
ALT: 38 U/L (ref 0–55)
AST: 39 U/L — AB (ref 5–34)
Alkaline Phosphatase: 136 U/L (ref 40–150)
Anion Gap: 8 mEq/L (ref 3–11)
BUN: 23 mg/dL (ref 7.0–26.0)
CHLORIDE: 108 meq/L (ref 98–109)
CO2: 25 meq/L (ref 22–29)
Calcium: 9.7 mg/dL (ref 8.4–10.4)
Creatinine: 1.5 mg/dL — ABNORMAL HIGH (ref 0.6–1.1)
EGFR: 43 mL/min/{1.73_m2} — ABNORMAL LOW (ref >90)
GLUCOSE: 137 mg/dL (ref 70–140)
POTASSIUM: 5.7 meq/L — AB (ref 3.5–5.1)
SODIUM: 141 meq/L (ref 136–145)
Total Bilirubin: 0.23 mg/dL (ref 0.20–1.20)
Total Protein: 7.2 g/dL (ref 6.4–8.3)

## 2015-11-28 LAB — FERRITIN: FERRITIN: 254 ng/mL (ref 9–269)

## 2015-11-28 LAB — IRON AND TIBC
%SAT: 19 % — ABNORMAL LOW (ref 21–57)
IRON: 62 ug/dL (ref 41–142)
TIBC: 331 ug/dL (ref 236–444)
UIBC: 270 ug/dL (ref 120–384)

## 2015-11-28 LAB — UA PROTEIN, DIPSTICK - CHCC: Protein, ur: 300 mg/dL

## 2015-11-28 LAB — CEA (IN HOUSE-CHCC): CEA (CHCC-In House): 28.99 ng/mL — ABNORMAL HIGH (ref 0.00–5.00)

## 2015-11-28 MED ORDER — ONDANSETRON HCL 8 MG PO TABS
8.0000 mg | ORAL_TABLET | Freq: Once | ORAL | Status: AC
  Administered 2015-11-28: 8 mg via ORAL

## 2015-11-28 MED ORDER — DEXTROSE 5 % IV SOLN
Freq: Once | INTRAVENOUS | Status: AC
  Administered 2015-11-28: 17:00:00 via INTRAVENOUS

## 2015-11-28 MED ORDER — ONDANSETRON HCL 8 MG PO TABS
ORAL_TABLET | ORAL | Status: AC
  Filled 2015-11-28: qty 1

## 2015-11-28 MED ORDER — SODIUM CHLORIDE 0.9 % IV SOLN
2200.0000 mg/m2 | INTRAVENOUS | Status: DC
  Administered 2015-11-28: 3950 mg via INTRAVENOUS
  Filled 2015-11-28: qty 79

## 2015-11-28 MED ORDER — DEXAMETHASONE SODIUM PHOSPHATE 10 MG/ML IJ SOLN
INTRAMUSCULAR | Status: AC
  Filled 2015-11-28: qty 1

## 2015-11-28 MED ORDER — DEXAMETHASONE SODIUM PHOSPHATE 10 MG/ML IJ SOLN
10.0000 mg | Freq: Once | INTRAMUSCULAR | Status: AC
  Administered 2015-11-28: 10 mg via INTRAVENOUS

## 2015-11-28 MED ORDER — SODIUM CHLORIDE 0.9 % IJ SOLN
10.0000 mL | INTRAMUSCULAR | Status: DC | PRN
  Administered 2015-11-28: 10 mL via INTRAVENOUS
  Filled 2015-11-28: qty 10

## 2015-11-28 MED ORDER — HEPARIN SOD (PORK) LOCK FLUSH 100 UNIT/ML IV SOLN
500.0000 [IU] | Freq: Once | INTRAVENOUS | Status: DC | PRN
  Filled 2015-11-28: qty 5

## 2015-11-28 MED ORDER — SODIUM CHLORIDE 0.9 % IJ SOLN
10.0000 mL | INTRAMUSCULAR | Status: DC | PRN
  Filled 2015-11-28: qty 10

## 2015-11-28 MED ORDER — OXALIPLATIN CHEMO INJECTION 100 MG/20ML
70.0000 mg/m2 | Freq: Once | INTRAVENOUS | Status: AC
  Administered 2015-11-28: 125 mg via INTRAVENOUS
  Filled 2015-11-28: qty 20

## 2015-11-28 MED ORDER — LEUCOVORIN CALCIUM INJECTION 350 MG
400.0000 mg/m2 | Freq: Once | INTRAMUSCULAR | Status: AC
  Administered 2015-11-28: 716 mg via INTRAVENOUS
  Filled 2015-11-28: qty 35.8

## 2015-11-28 MED ORDER — SODIUM CHLORIDE 0.9 % IV SOLN: 5.0000 mg/kg | Freq: Once | INTRAVENOUS | Status: DC

## 2015-11-28 MED ORDER — SODIUM CHLORIDE 0.9 % IV SOLN: Freq: Once | INTRAVENOUS | Status: DC

## 2015-11-28 NOTE — Progress Notes (Signed)
[facility]  [phone]) [phone] [phone]) [phone]  Clinic Follow Up Note   Patient Care Team: Dione Housekeeper, MD as PCP - General (Family Medicine) Tania Ade, RN as Registered Nurse (Oncology) 11/28/2015  CHIEF COMPLAINTS:  Follow-up of metastatic colon cancer  Oncology History   Metastatic colon cancer to liver   Staging form: Colon and Rectum, AJCC 7th Edition     Clinical: Stage Unknown ([place], NX, M1) - Unsigned        Metastatic colon cancer to liver ([place])   07/14/2014 Imaging    CT abdomen and pelvis with contrast showed a 6 cm segment of sigmoid bowel wall thickening with adjacent 14 x 12 mm nodules, diffuse hepatic metastasis. No bowel obstruction.      07/14/2014 Initial Diagnosis    Metastatic colon cancer to liver      07/25/2014 Procedure    Colonoscopy showed a near circumferential medial mass in the sigmoid colon, partially obstructing consistent with carcinoma.      07/25/2014 Initial Biopsy    Sigmoid: Biopsy showed invasive adenocarcinoma arising in a background of high-grade dysplasia.      07/28/2014 Imaging    CT chest showed no evidence of metastasis      08/09/2014 -  Chemotherapy    mFOLFOX6 every 2 weeks, dose reduction from cycle 7 due to cytopenia, Avastin added from cycle 3      08/11/2014 - 08/15/2014 Hospital Admission    Pt was admitted for fever and nausea, treated for UTI, liver biopsy cancelled due to the infection        HISTORY OF PRESENTING ILLNESS:  Michelle Erickson 60 y.o. female is here because of abnoaml CT findings which is highly suspecious for metastatic colon cancer.   She presented intermittent nausea, anorexia and abdominal pain since Nov 2015, she has mild pain in the LUQ, crapy pain, positional (bending over),   it happened once every 2-3 weeks, and it has been more frequent in the past month. She has normal BM, but did notice the caliber of the stool is smaller  than before. She denies any melena or hematochezia. She lost about 13 lbs in the past 6 months.   She presents to local emergency room in November 2015, and May 2016, was felt to be related to gastric virus. Due to the worsening symptoms lately, she went to [facility] ED on 07/13/2014, CT abdomen was obtained which showed multiple large liver metastasis and probable sigmoid colon mass. She was referred to [place] for further workup. She is scheduled to see GI Dr. Olevia Perches this afternoon.  CURRENT THERAPY: mFOLFOX6 and Avastin every 2 weeks, started on 08/09/2014   INTERIM HISTORY Michelle Erickson returns for follow-up and chemo. She is accompanied by her husband to the clinic today. She is doing well overall, and has been tolerating chemotherapy well. She denies any significant pain, nausea, diarrhea, or neuropathy. Her weight is stable, no fever or chills. She functions better while at home, and remains to be active.  MEDICAL HISTORY Past Medical History:  Diagnosis Date  . Back pain   . Diabetes mellitus without complication ([place])   . Family history of colon cancer   . Hyperlipemia   . Hypertension   . Metastatic colon cancer to liver ([place]) 07/21/2014  . Snoring   . Wears glasses    contacts/glasses    SURGICAL HISTORY: Past Surgical History:  Procedure Laterality Date  . CATARACT EXTRACTION Bilateral   . MOUTH SURGERY    .  PORTACATH PLACEMENT N/A 08/03/2014   Procedure: INSERTION PORT-A-CATH;  Surgeon: Stark Klein, MD;  Location: [facility];  Service: General;  Laterality: N/A;    SOCIAL HISTORY: Social History   Social History  . Marital status: Married    Spouse name: Charlotte Crumb  . Number of children: 2  . Years of education: N/A   Occupational History  . Not on file.   Social History Main Topics  . Smoking status: Former Smoker    Packs/day: 1.00    Years: 6.00    Types: Cigarettes    Quit date: 01/21/1976  . Smokeless tobacco: Never Used  . Alcohol use No  . Drug use: No  .  Sexual activity: Yes    Birth control/ protection: Post-menopausal     Comment: # 2 pregnancies and #2 live births   Other Topics Concern  . Not on file   Social History Narrative   Married, husband [name]   Has #2 adult children   Previously worked at [employer] HISTORY: Family History  Problem Relation Age of Onset  . COPD Father   . Heart attack Father   . Arthritis Sister   . Diabetes Sister   . Colon cancer Sister     dx over 37  . Colon polyps Sister   . Seizures Brother   . Colon cancer Sister     dx in her early 53s, died in her early to mid 42s  . Diabetes Mother     ALLERGIES:  has No Known Allergies.  MEDICATIONS:  Current Outpatient Prescriptions  Medication Sig Dispense Refill  . amLODipine (NORVASC) 5 MG tablet Take 2 tablets (10 mg total) by mouth daily. 30 tablet 0  . atorvastatin (LIPITOR) 40 MG tablet Take by mouth.    . cetirizine (ZYRTEC) 10 MG tablet Take 10 mg by mouth daily as needed for allergies. Reported on 05/02/2015    . ferrous sulfate 325 (65 FE) MG tablet Take 325 mg by mouth daily with breakfast.    . Insulin Glargine (LANTUS SOLOSTAR) 100 UNIT/ML Solostar Pen Inject 23 Units into the skin daily at 10 pm.    . insulin lispro (HUMALOG) 100 UNIT/ML injection Inject 4-8 Units into the skin 2 (two) times daily as needed for high blood sugar. Sliding scale    . lidocaine-prilocaine (EMLA) cream     . ondansetron (ZOFRAN-ODT) 8 MG disintegrating tablet Take 8 mg by mouth every 8 (eight) hours as needed for nausea or vomiting. Reported on 05/02/2015     No current facility-administered medications for this visit.     REVIEW OF SYSTEMS:   Constitutional: Denies fevers, chills or abnormal night sweats Eyes: Denies blurriness of vision, double vision or watery eyes Ears, nose, mouth, throat, and face: Denies mucositis or sore throat Respiratory: Denies cough, dyspnea or wheezes Cardiovascular: Denies palpitation, chest  discomfort or lower extremity swelling Gastrointestinal:  Denies nausea, heartburn or change in bowel habits Skin: Denies abnormal skin rashes Lymphatics: Denies new lymphadenopathy or easy bruising Neurological:Denies numbness, tingling or new weaknesses Behavioral/Psych: Mood is stable, no new changes  All other systems were reviewed with the patient and are negative.  PHYSICAL EXAMINATION: ECOG PERFORMANCE STATUS: 0  BP (!) 150/48 (BP Location: Left Arm, Patient Position: Sitting)   Pulse 90   Temp 98.5 F (36.9 C) (Oral)   Resp 18   Ht '5\' 4"'$  (1.626 m)   Wt 160 lb 4.8 oz (72.7 kg)  SpO2 100%   BMI 27.52 kg/m  GENERAL:alert, no distress and comfortable SKIN: skin color, texture, turgor are normal, no rashes or significant lesions EYES: normal, conjunctiva are pink and non-injected, sclera clear OROPHARYNX:no exudate, no erythema and lips, buccal mucosa, and tongue normal  NECK: supple, thyroid normal size, non-tender, without nodularity LYMPH:  no palpable lymphadenopathy in the cervical, axillary or inguinal LUNGS: clear to auscultation and percussion with normal breathing effort HEART: regular rate & rhythm and no murmurs and no lower extremity edema ABDOMEN:abdomen soft, non-tender and normal bowel sounds Musculoskeletal:no cyanosis of digits and no clubbing  PSYCH: alert & oriented x 3 with fluent speech NEURO: no focal motor/sensory deficits  LABORATORY DATA:  I have reviewed the data as listed CBC Latest Ref Rng & Units 11/28/2015 11/14/2015 10/29/2015  WBC 3.9 - 10.3 10e3/uL 5.1 4.3 5.5  Hemoglobin 11.6 - 15.9 g/dL 9.0(L) 9.0(L) 9.9(L)  Hematocrit 34.8 - 46.6 % 28.0(L) 28.1(L) 30.8(L)  Platelets 145 - 400 10e3/uL 131(L) 105(L) 144(L)   CMP Latest Ref Rng & Units 11/14/2015 10/29/2015 10/03/2015  Glucose 70 - 140 mg/dl 167(H) 125 166(H)  BUN 7.0 - 26.0 mg/dL 18.2 24.3 18.8  Creatinine 0.6 - 1.1 mg/dL 1.3(H) 1.5(H) 1.5(H)  Sodium 136 - 145 mEq/L 139 136 141    Potassium 3.5 - 5.1 mEq/L 4.7 5.0 4.6  Chloride 101 - 111 mmol/L - - -  CO2 22 - 29 mEq/L 24 21(L) 24  Calcium 8.4 - 10.4 mg/dL 9.4 9.9 9.4  Total Protein 6.4 - 8.3 g/dL 7.1 7.5 7.1  Total Bilirubin 0.20 - 1.20 mg/dL 0.22 0.32 <0.30  Alkaline Phos 40 - 150 U/L 136 134 110  AST 5 - 34 U/L 43(H) 63(H) 31  ALT 0 - 55 U/L 48 52 29   Results for PAYTON, MODER (MRN [id number]) as of 10/31/2015 06:37  Ref. Range 06/27/2015 08:10 09/19/2015 07:36 09/19/2015 09:55 10/29/2015 09:54 10/29/2015 10:12  CEA Latest Ref Range: 0.0 - 4.7 ng/mL 24.7 (H)  37.9 (H)  63.7 (H)  CEA ([facility]) Latest Ref Range: 0.00 - 5.00 ng/mL  20.03 (H)  36.34 (H)    PATHOLOGY REPORTS: Diagnosis 07/25/2014  Surgical [P], sigmoid - INVASIVE ADENOCARCINOMA ARISING IN A BACKGROUND OF HIGH GRADE DYSPLASIA. - SEE COMMENT. Microscopic Comment Results are phoned to Dr. Olevia Perches. Internal departmental review obtained (Dr. Donato Heinz) with agreement.  Diagnosis 09/05/2014 Liver, biopsy - METASTATIC ADENOCARCINOMA, SEE COMMENT. Microscopic Comment Given the patient's history of sigmoid invasive adenocarcinoma and the morphology, the findings are consistent with metastatic colorectal adenocarcinoma. The case was called to Dr. Burr Medico. Per request, MMR, MSI, and KRAS testing will be ordered.  KRAS MUTATION (+) c36G>A (p. G12D)  RADIOGRAPHIC STUDIES: I have personally reviewed the radiological images as listed and agreed with the findings in the report.   CT chest, abdomen and pelvis with contrast 10/29/2015 IMPRESSION: 1. New/enlarging pulmonary nodules, most notably the 8 by 6 mm left lower lobe pulmonary nodule, favoring metastatic disease. 2. The liver lesions are subjectively grossly similar in size, difficult to measure due to the lack of IV contrast causing poor boundaries. 3. There is some faint heterogeneity of bony density in the sternum, but this is not significantly changed from 07/28/2014 and is probably incidental given  the lack of other evidence of osseous metastatic disease. 4. Lower lumbar spondylosis and degenerative disc disease causing impingement. 5. Coronary, aortic arch, and branch vessel atherosclerotic vascular disease. Aortoiliac atherosclerotic vascular disease. 6. Mildly dilated esophagus with air-fluid  level, probably reflecting dysmotility. 7. Cholelithiasis. 8. Thick wall urinary bladder, cystitis not excluded.   Colonoscopy 07/25/2014 Dr. Olevia Perches  ENDOSCOPIC IMPRESSION: Near circumferential medium mass was found in the sigmoid colon; multiple biopsies were performed using cold forceps, partially obstructing mass consistent with carcinoma. Placement of endoclips and tattoo at the margins of the mass which extends from 18-25 cm from the anus  ASSESSMENT & PLAN: 60 year old African-American female, presented with intermittent nausea vomiting and anemia.   1. Sigmoid colon cancer with liver metastasis, TxNxM1, stage IV, MSI stable, KRAS mutation(+) -I previously reviewed her CT scans, colonoscopy, and colon mass biopsy findings with patient and her husband in great details, images were reviewed in person  -I reviewed her liver biopsy results, which confirmed metastasis from colon cancer. -The natural history of metastatic colon cancer and treatment options were discussed with patient and her husband. Giving the diffuse metastasis in the liver, this is unfortunately incurable disease. I recommend systemic chemotherapy to control her metastatic disease, the goal of therapy is palliative and to prolong her life. -she is on first-line chemotherapy FOLFOX and Avastin every 2 weeks, tolerarting well. -her tumor MSI is stable, KRAS mutated, not a candidate for immunotherapy or EGFR inhibitor. -Her CEA has dropped significantly. I reviewed her restaging CT scan from 07/18/2015 which showed stable disease overall, slightly increased of a few liver lesions.  -I recommend her to continue current first-line  chemotherapy given the stable disease overall and excellent tolerance. We discussed she might develop resistance to the chemotherapy in the near future, and I would consider switching to second line treatment.  -We'll continue follow-up her CEA monthly, which has slightly increased lately  -She has taken two chemotherapy break lately -I reviewed her restaging CT scan images with patient and her husband, due to the lack of contrast (renal insufficiency), it is difficult to compare, but her liver disease overall appears to be stable. However she has developed a few lung nodules, with the largest one 8 mm in the left lower lobe, concerning for new metastasis. This could be related to her chemotherapy break, or earlier disease progression  -Given the overall stable disease, I recommend her to restart FOLFOX and Avastin, without chemotherapy break for the next 2-3 months, and repeat staging scan. She agrees. 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 -Her ferritin is normal, serum iron and saturation slightly low, TIBC low normal, I suggest her to take oral iron supplement, she tolerates well, we'll continue once daily, with orange juice or vitamin C  -will repeat her iron study 0n 06/13/2015 showed adequate iron level  3. DM, HTN -Continue follow-up with PCP - we discussed the potential impact of chemotherapy and premedication dexamethasone on her blood pressure and sugar,will  Monitor closely. Her BG has been well controlled lately  -Her blood pressure has been high lately, we'll repeat in the infusion room. I encouraged her to measure her blood pressure at home. We discussed Avastin can increase her blood pressure, need to be  monitored closely. If SBP persistently high than 150, I'll consider adding on a new antihypertensive medication. She will follow-up with her primary care physician also.  4. AKI, proteinuria secondary to Avastin  -Cr has been around 1.1-1.3  will  continue monitoring renal function  -I encourage her to drink more fluids  5. Mild thrombocytopenia -Secondary to chemotherapy. -will monitor closely.  PLan -Lab reviewed, mild anemia and thrombocytopenia, overall stable, adequate for treatment, continue FOLFOX and Avastin (if urine protein <300) today -Due to the upcoming Thanksgiving holiday, we'll postpone her next cycle chemotherapy therapy to 3 weeks, and continue every 2 weeks -I'll see her back in 5 weeks with a labs and a restaging CT scan a few days before. She will have labs before CT, hold IV contrast if her creatinine more than 1.3, I encouraged her to drink more water the week before her CT scan.  All questions were answered. The patient knows to call the clinic with any problems, questions or concerns.  I spent 20 minutes counseling the patient face to face. The total time spent in the appointment was 25 minutes and more than 50% was on counseling.     Truitt Merle, MD 11/28/2015

## 2015-11-28 NOTE — Patient Instructions (Signed)
[facility] Discharge Instructions for Patients Receiving Chemotherapy  Today you received the following chemotherapy agents: Leucovorin, Oxaliplatin and 5FU pump.  To help prevent nausea and vomiting after your treatment, we encourage you to take your nausea medication as prescribed. If you develop nausea and vomiting that is not controlled by your nausea medication, call the clinic.   BELOW ARE SYMPTOMS THAT SHOULD BE REPORTED IMMEDIATELY:  *FEVER GREATER THAN 100.5 F  *CHILLS WITH OR WITHOUT FEVER  NAUSEA AND VOMITING THAT IS NOT CONTROLLED WITH YOUR NAUSEA MEDICATION  *UNUSUAL SHORTNESS OF BREATH  *UNUSUAL BRUISING OR BLEEDING  TENDERNESS IN MOUTH AND THROAT WITH OR WITHOUT PRESENCE OF ULCERS  *URINARY PROBLEMS  *BOWEL PROBLEMS  UNUSUAL RASH Items with * indicate a potential emergency and should be followed up as soon as possible.  Feel free to call the clinic you have any questions or concerns. The clinic phone number is [phone].  Please show the [place] at check-in to the Emergency Department and triage nurse.

## 2015-11-28 NOTE — Addendum Note (Signed)
Addended by: Truitt Merle on: 11/28/2015 01:53 PM   Modules accepted: Orders

## 2015-11-28 NOTE — Patient Instructions (Signed)

## 2015-11-28 NOTE — Telephone Encounter (Signed)
Appointments scheduled per 11/8 LOS. Patient given AVS report and calendars of future scheduled appointments.  Patient stated that she usually uses water based contrast before scan a few hours prior. No prep given

## 2015-11-30 ENCOUNTER — Ambulatory Visit (HOSPITAL_BASED_OUTPATIENT_CLINIC_OR_DEPARTMENT_OTHER): Payer: BLUE CROSS/BLUE SHIELD

## 2015-11-30 VITALS — BP 139/59 | HR 89 | Temp 98.3°F | Resp 18

## 2015-11-30 DIAGNOSIS — C187 Malignant neoplasm of sigmoid colon: Secondary | ICD-10-CM

## 2015-11-30 DIAGNOSIS — C787 Secondary malignant neoplasm of liver and intrahepatic bile duct: Principal | ICD-10-CM

## 2015-11-30 DIAGNOSIS — Z452 Encounter for adjustment and management of vascular access device: Secondary | ICD-10-CM

## 2015-11-30 DIAGNOSIS — C189 Malignant neoplasm of colon, unspecified: Secondary | ICD-10-CM

## 2015-11-30 MED ORDER — SODIUM CHLORIDE 0.9 % IJ SOLN
10.0000 mL | INTRAMUSCULAR | Status: DC | PRN
  Administered 2015-11-30: 10 mL
  Filled 2015-11-30: qty 10

## 2015-11-30 MED ORDER — HEPARIN SOD (PORK) LOCK FLUSH 100 UNIT/ML IV SOLN
500.0000 [IU] | Freq: Once | INTRAVENOUS | Status: AC | PRN
  Administered 2015-11-30: 500 [IU]
  Filled 2015-11-30: qty 5

## 2015-12-19 ENCOUNTER — Other Ambulatory Visit (HOSPITAL_BASED_OUTPATIENT_CLINIC_OR_DEPARTMENT_OTHER): Payer: BLUE CROSS/BLUE SHIELD

## 2015-12-19 ENCOUNTER — Ambulatory Visit (HOSPITAL_BASED_OUTPATIENT_CLINIC_OR_DEPARTMENT_OTHER): Payer: BLUE CROSS/BLUE SHIELD

## 2015-12-19 VITALS — BP 151/70 | HR 90 | Temp 98.1°F | Resp 16

## 2015-12-19 DIAGNOSIS — Z5111 Encounter for antineoplastic chemotherapy: Secondary | ICD-10-CM

## 2015-12-19 DIAGNOSIS — C187 Malignant neoplasm of sigmoid colon: Secondary | ICD-10-CM

## 2015-12-19 DIAGNOSIS — C787 Secondary malignant neoplasm of liver and intrahepatic bile duct: Secondary | ICD-10-CM

## 2015-12-19 DIAGNOSIS — C189 Malignant neoplasm of colon, unspecified: Secondary | ICD-10-CM

## 2015-12-19 LAB — COMPREHENSIVE METABOLIC PANEL
ALBUMIN: 3.3 g/dL — AB (ref 3.5–5.0)
ALK PHOS: 124 U/L (ref 40–150)
ALT: 25 U/L (ref 0–55)
AST: 30 U/L (ref 5–34)
Anion Gap: 7 mEq/L (ref 3–11)
BILIRUBIN TOTAL: 0.25 mg/dL (ref 0.20–1.20)
BUN: 37.1 mg/dL — ABNORMAL HIGH (ref 7.0–26.0)
CO2: 23 mEq/L (ref 22–29)
Calcium: 9.4 mg/dL (ref 8.4–10.4)
Chloride: 109 mEq/L (ref 98–109)
Creatinine: 1.6 mg/dL — ABNORMAL HIGH (ref 0.6–1.1)
EGFR: 39 mL/min/{1.73_m2} — AB (ref >90)
GLUCOSE: 126 mg/dL (ref 70–140)
Potassium: 4.9 mEq/L (ref 3.5–5.1)
SODIUM: 139 meq/L (ref 136–145)
TOTAL PROTEIN: 7.1 g/dL (ref 6.4–8.3)

## 2015-12-19 LAB — CBC WITH DIFFERENTIAL/PLATELET
BASO%: 1.4 % (ref 0.0–2.0)
Basophils Absolute: 0.1 10*3/uL (ref 0.0–0.1)
EOS ABS: 0.3 10*3/uL (ref 0.0–0.5)
EOS%: 7.3 % — ABNORMAL HIGH (ref 0.0–7.0)
HCT: 27.6 % — ABNORMAL LOW (ref 34.8–46.6)
HEMOGLOBIN: 8.8 g/dL — AB (ref 11.6–15.9)
LYMPH#: 1 10*3/uL (ref 0.9–3.3)
LYMPH%: 24.9 % (ref 14.0–49.7)
MCH: 30.1 pg (ref 25.1–34.0)
MCHC: 31.8 g/dL (ref 31.5–36.0)
MCV: 94.6 fL (ref 79.5–101.0)
MONO#: 0.9 10*3/uL (ref 0.1–0.9)
MONO%: 21.8 % — AB (ref 0.0–14.0)
NEUT%: 44.6 % (ref 38.4–76.8)
NEUTROS ABS: 1.8 10*3/uL (ref 1.5–6.5)
PLATELETS: 139 10*3/uL — AB (ref 145–400)
RBC: 2.92 10*6/uL — ABNORMAL LOW (ref 3.70–5.45)
RDW: 17.6 % — AB (ref 11.2–14.5)
WBC: 4 10*3/uL (ref 3.9–10.3)

## 2015-12-19 LAB — UA PROTEIN, DIPSTICK - CHCC: Protein, ur: 300 mg/dL

## 2015-12-19 MED ORDER — ONDANSETRON HCL 8 MG PO TABS
8.0000 mg | ORAL_TABLET | Freq: Once | ORAL | Status: AC
  Administered 2015-12-19: 8 mg via ORAL

## 2015-12-19 MED ORDER — DEXTROSE 5 % IV SOLN
Freq: Once | INTRAVENOUS | Status: AC
  Administered 2015-12-19: 10:00:00 via INTRAVENOUS

## 2015-12-19 MED ORDER — SODIUM CHLORIDE 0.9 % IV SOLN
2200.0000 mg/m2 | INTRAVENOUS | Status: DC
  Administered 2015-12-19: 3950 mg via INTRAVENOUS
  Filled 2015-12-19: qty 79

## 2015-12-19 MED ORDER — SODIUM CHLORIDE 0.9 % IV SOLN: 5.0000 mg/kg | Freq: Once | INTRAVENOUS | Status: DC

## 2015-12-19 MED ORDER — LEUCOVORIN CALCIUM INJECTION 350 MG
400.0000 mg/m2 | Freq: Once | INTRAMUSCULAR | Status: AC
  Administered 2015-12-19: 716 mg via INTRAVENOUS
  Filled 2015-12-19: qty 35.8

## 2015-12-19 MED ORDER — SODIUM CHLORIDE 0.9 % IV SOLN
Freq: Once | INTRAVENOUS | Status: AC
  Administered 2015-12-19: 09:00:00 via INTRAVENOUS

## 2015-12-19 MED ORDER — OXALIPLATIN CHEMO INJECTION 100 MG/20ML
70.0000 mg/m2 | Freq: Once | INTRAVENOUS | Status: AC
  Administered 2015-12-19: 125 mg via INTRAVENOUS
  Filled 2015-12-19: qty 20

## 2015-12-19 MED ORDER — DEXAMETHASONE SODIUM PHOSPHATE 10 MG/ML IJ SOLN
10.0000 mg | Freq: Once | INTRAMUSCULAR | Status: AC
  Administered 2015-12-19: 10 mg via INTRAVENOUS

## 2015-12-19 MED ORDER — ONDANSETRON HCL 8 MG PO TABS
ORAL_TABLET | ORAL | Status: AC
  Filled 2015-12-19: qty 1

## 2015-12-19 MED ORDER — DEXAMETHASONE SODIUM PHOSPHATE 10 MG/ML IJ SOLN
INTRAMUSCULAR | Status: AC
  Filled 2015-12-19: qty 1

## 2015-12-19 NOTE — Progress Notes (Signed)
Dr Burr Medico aware of creatnine 1.6. Ok to treat. 576mL Normal saline bolus to be given during treatment. Will hold Avastin due to urine protein results.

## 2015-12-19 NOTE — Patient Instructions (Signed)
[facility] Discharge Instructions for Patients Receiving Chemotherapy  Today you received the following chemotherapy agents: Leucovorin, Oxaliplatin and 5FU pump.  To help prevent nausea and vomiting after your treatment, we encourage you to take your nausea medication as prescribed. If you develop nausea and vomiting that is not controlled by your nausea medication, call the clinic.   BELOW ARE SYMPTOMS THAT SHOULD BE REPORTED IMMEDIATELY:  *FEVER GREATER THAN 100.5 F  *CHILLS WITH OR WITHOUT FEVER  NAUSEA AND VOMITING THAT IS NOT CONTROLLED WITH YOUR NAUSEA MEDICATION  *UNUSUAL SHORTNESS OF BREATH  *UNUSUAL BRUISING OR BLEEDING  TENDERNESS IN MOUTH AND THROAT WITH OR WITHOUT PRESENCE OF ULCERS  *URINARY PROBLEMS  *BOWEL PROBLEMS  UNUSUAL RASH Items with * indicate a potential emergency and should be followed up as soon as possible.  Feel free to call the clinic you have any questions or concerns. The clinic phone number is [phone].  Please show the [place] at check-in to the Emergency Department and triage nurse.

## 2015-12-21 ENCOUNTER — Ambulatory Visit (HOSPITAL_BASED_OUTPATIENT_CLINIC_OR_DEPARTMENT_OTHER): Payer: BLUE CROSS/BLUE SHIELD

## 2015-12-21 VITALS — BP 149/69 | HR 91 | Temp 98.0°F | Resp 18

## 2015-12-21 DIAGNOSIS — C787 Secondary malignant neoplasm of liver and intrahepatic bile duct: Principal | ICD-10-CM

## 2015-12-21 DIAGNOSIS — Z452 Encounter for adjustment and management of vascular access device: Secondary | ICD-10-CM | POA: Diagnosis not present

## 2015-12-21 DIAGNOSIS — C187 Malignant neoplasm of sigmoid colon: Secondary | ICD-10-CM

## 2015-12-21 DIAGNOSIS — C189 Malignant neoplasm of colon, unspecified: Secondary | ICD-10-CM

## 2015-12-21 MED ORDER — SODIUM CHLORIDE 0.9 % IJ SOLN
10.0000 mL | INTRAMUSCULAR | Status: DC | PRN
  Administered 2015-12-21: 10 mL
  Filled 2015-12-21: qty 10

## 2015-12-21 MED ORDER — HEPARIN SOD (PORK) LOCK FLUSH 100 UNIT/ML IV SOLN
500.0000 [IU] | Freq: Once | INTRAVENOUS | Status: AC | PRN
  Administered 2015-12-21: 500 [IU]
  Filled 2015-12-21: qty 5

## 2015-12-21 NOTE — Patient Instructions (Signed)

## 2015-12-31 ENCOUNTER — Other Ambulatory Visit: Payer: BLUE CROSS/BLUE SHIELD

## 2015-12-31 ENCOUNTER — Ambulatory Visit (HOSPITAL_COMMUNITY): Payer: BLUE CROSS/BLUE SHIELD

## 2016-01-02 ENCOUNTER — Telehealth: Payer: Self-pay | Admitting: Hematology

## 2016-01-02 ENCOUNTER — Ambulatory Visit (HOSPITAL_BASED_OUTPATIENT_CLINIC_OR_DEPARTMENT_OTHER): Payer: BLUE CROSS/BLUE SHIELD

## 2016-01-02 ENCOUNTER — Encounter: Payer: Self-pay | Admitting: Hematology

## 2016-01-02 ENCOUNTER — Ambulatory Visit (HOSPITAL_BASED_OUTPATIENT_CLINIC_OR_DEPARTMENT_OTHER): Payer: BLUE CROSS/BLUE SHIELD | Admitting: Hematology

## 2016-01-02 ENCOUNTER — Other Ambulatory Visit (HOSPITAL_BASED_OUTPATIENT_CLINIC_OR_DEPARTMENT_OTHER): Payer: BLUE CROSS/BLUE SHIELD

## 2016-01-02 ENCOUNTER — Telehealth: Payer: Self-pay | Admitting: *Deleted

## 2016-01-02 VITALS — BP 159/67

## 2016-01-02 VITALS — BP 141/54 | HR 82 | Temp 98.2°F | Resp 18 | Ht 64.0 in | Wt 161.5 lb

## 2016-01-02 DIAGNOSIS — C189 Malignant neoplasm of colon, unspecified: Secondary | ICD-10-CM

## 2016-01-02 DIAGNOSIS — D630 Anemia in neoplastic disease: Secondary | ICD-10-CM | POA: Diagnosis not present

## 2016-01-02 DIAGNOSIS — C787 Secondary malignant neoplasm of liver and intrahepatic bile duct: Secondary | ICD-10-CM | POA: Diagnosis not present

## 2016-01-02 DIAGNOSIS — E119 Type 2 diabetes mellitus without complications: Secondary | ICD-10-CM | POA: Diagnosis not present

## 2016-01-02 DIAGNOSIS — Z95828 Presence of other vascular implants and grafts: Secondary | ICD-10-CM

## 2016-01-02 DIAGNOSIS — Z5111 Encounter for antineoplastic chemotherapy: Secondary | ICD-10-CM | POA: Diagnosis not present

## 2016-01-02 DIAGNOSIS — C187 Malignant neoplasm of sigmoid colon: Secondary | ICD-10-CM | POA: Diagnosis not present

## 2016-01-02 DIAGNOSIS — N189 Chronic kidney disease, unspecified: Secondary | ICD-10-CM

## 2016-01-02 DIAGNOSIS — Z452 Encounter for adjustment and management of vascular access device: Secondary | ICD-10-CM | POA: Diagnosis not present

## 2016-01-02 DIAGNOSIS — D6959 Other secondary thrombocytopenia: Secondary | ICD-10-CM

## 2016-01-02 DIAGNOSIS — Z5112 Encounter for antineoplastic immunotherapy: Secondary | ICD-10-CM

## 2016-01-02 DIAGNOSIS — I10 Essential (primary) hypertension: Secondary | ICD-10-CM

## 2016-01-02 LAB — COMPREHENSIVE METABOLIC PANEL WITH GFR
ALT: 60 U/L — ABNORMAL HIGH (ref 0–55)
AST: 45 U/L — ABNORMAL HIGH (ref 5–34)
Albumin: 3.2 g/dL — ABNORMAL LOW (ref 3.5–5.0)
Alkaline Phosphatase: 136 U/L (ref 40–150)
Anion Gap: 9 meq/L (ref 3–11)
BUN: 23.2 mg/dL (ref 7.0–26.0)
CO2: 23 meq/L (ref 22–29)
Calcium: 9.4 mg/dL (ref 8.4–10.4)
Chloride: 109 meq/L (ref 98–109)
Creatinine: 1.5 mg/dL — ABNORMAL HIGH (ref 0.6–1.1)
EGFR: 41 ml/min/1.73 m2 — ABNORMAL LOW
Glucose: 155 mg/dL — ABNORMAL HIGH (ref 70–140)
Potassium: 4.8 meq/L (ref 3.5–5.1)
Sodium: 140 meq/L (ref 136–145)
Total Bilirubin: 0.29 mg/dL (ref 0.20–1.20)
Total Protein: 6.9 g/dL (ref 6.4–8.3)

## 2016-01-02 LAB — CBC WITH DIFFERENTIAL/PLATELET
BASO%: 0.7 % (ref 0.0–2.0)
Basophils Absolute: 0 10*3/uL (ref 0.0–0.1)
EOS%: 6.8 % (ref 0.0–7.0)
Eosinophils Absolute: 0.3 10*3/uL (ref 0.0–0.5)
HEMATOCRIT: 26.3 % — AB (ref 34.8–46.6)
HGB: 8.3 g/dL — ABNORMAL LOW (ref 11.6–15.9)
LYMPH#: 0.8 10*3/uL — AB (ref 0.9–3.3)
LYMPH%: 19.8 % (ref 14.0–49.7)
MCH: 30.1 pg (ref 25.1–34.0)
MCHC: 31.7 g/dL (ref 31.5–36.0)
MCV: 94.9 fL (ref 79.5–101.0)
MONO#: 0.6 10*3/uL (ref 0.1–0.9)
MONO%: 13.7 % (ref 0.0–14.0)
NEUT%: 59 % (ref 38.4–76.8)
NEUTROS ABS: 2.4 10*3/uL (ref 1.5–6.5)
PLATELETS: 99 10*3/uL — AB (ref 145–400)
RBC: 2.77 10*6/uL — AB (ref 3.70–5.45)
RDW: 17.5 % — ABNORMAL HIGH (ref 11.2–14.5)
WBC: 4.1 10*3/uL (ref 3.9–10.3)

## 2016-01-02 LAB — CEA (IN HOUSE-CHCC): CEA (CHCC-IN HOUSE): 40.81 ng/mL — AB (ref 0.00–5.00)

## 2016-01-02 LAB — UA PROTEIN, DIPSTICK - CHCC: Protein, ur: 100 mg/dL

## 2016-01-02 MED ORDER — ONDANSETRON HCL 8 MG PO TABS
ORAL_TABLET | ORAL | Status: AC
  Filled 2016-01-02: qty 1

## 2016-01-02 MED ORDER — DEXAMETHASONE SODIUM PHOSPHATE 10 MG/ML IJ SOLN
INTRAMUSCULAR | Status: AC
  Filled 2016-01-02: qty 1

## 2016-01-02 MED ORDER — ONDANSETRON HCL 8 MG PO TABS
8.0000 mg | ORAL_TABLET | Freq: Once | ORAL | Status: AC
  Administered 2016-01-02: 8 mg via ORAL

## 2016-01-02 MED ORDER — DEXAMETHASONE SODIUM PHOSPHATE 10 MG/ML IJ SOLN
10.0000 mg | Freq: Once | INTRAMUSCULAR | Status: AC
  Administered 2016-01-02: 10 mg via INTRAVENOUS

## 2016-01-02 MED ORDER — DEXTROSE 5 % IV SOLN
Freq: Once | INTRAVENOUS | Status: AC
  Administered 2016-01-02: 12:00:00 via INTRAVENOUS

## 2016-01-02 MED ORDER — SODIUM CHLORIDE 0.9 % IV SOLN
5.0000 mg/kg | Freq: Once | INTRAVENOUS | Status: AC
  Administered 2016-01-02: 350 mg via INTRAVENOUS
  Filled 2016-01-02: qty 14

## 2016-01-02 MED ORDER — LEUCOVORIN CALCIUM INJECTION 350 MG
400.0000 mg/m2 | Freq: Once | INTRAMUSCULAR | Status: AC
  Administered 2016-01-02: 716 mg via INTRAVENOUS
  Filled 2016-01-02: qty 35.8

## 2016-01-02 MED ORDER — SODIUM CHLORIDE 0.9 % IJ SOLN
10.0000 mL | INTRAMUSCULAR | Status: DC | PRN
  Administered 2016-01-02: 10 mL via INTRAVENOUS
  Filled 2016-01-02: qty 10

## 2016-01-02 MED ORDER — DEXTROSE 5 % IV SOLN
70.0000 mg/m2 | Freq: Once | INTRAVENOUS | Status: AC
  Administered 2016-01-02: 125 mg via INTRAVENOUS
  Filled 2016-01-02: qty 20

## 2016-01-02 MED ORDER — SODIUM CHLORIDE 0.9 % IV SOLN
INTRAVENOUS | Status: DC
  Administered 2016-01-02 (×2): via INTRAVENOUS

## 2016-01-02 MED ORDER — SODIUM CHLORIDE 0.9 % IV SOLN: INTRAVENOUS | Status: DC

## 2016-01-02 MED ORDER — SODIUM CHLORIDE 0.9 % IV SOLN
2200.0000 mg/m2 | INTRAVENOUS | Status: DC
  Administered 2016-01-02: 3950 mg via INTRAVENOUS
  Filled 2016-01-02: qty 79

## 2016-01-02 NOTE — Progress Notes (Signed)
Ok to treat with urine protein today per Judson Roch LPN, per MD Burr Medico

## 2016-01-02 NOTE — Progress Notes (Signed)
Ok to treat with Plt count of 99 and Creat of 1.5. Obtaining urine protein test before releasing Avastin orders.

## 2016-01-02 NOTE — Patient Instructions (Signed)
[facility] Discharge Instructions for Patients Receiving Chemotherapy  Today you received the following chemotherapy agents Leucovorin, Oxaliplatin and Adrucil, and Avastin To help prevent nausea and vomiting after your treatment, we encourage you to take your nausea medication as directed.    If you develop nausea and vomiting that is not controlled by your nausea medication, call the clinic.   BELOW ARE SYMPTOMS THAT SHOULD BE REPORTED IMMEDIATELY:  *FEVER GREATER THAN 100.5 F  *CHILLS WITH OR WITHOUT FEVER  NAUSEA AND VOMITING THAT IS NOT CONTROLLED WITH YOUR NAUSEA MEDICATION  *UNUSUAL SHORTNESS OF BREATH  *UNUSUAL BRUISING OR BLEEDING  TENDERNESS IN MOUTH AND THROAT WITH OR WITHOUT PRESENCE OF ULCERS  *URINARY PROBLEMS  *BOWEL PROBLEMS  UNUSUAL RASH Items with * indicate a potential emergency and should be followed up as soon as possible.  Feel free to call the clinic you have any questions or concerns. The clinic phone number is [phone].  Please show the [place] at check-in to the Emergency Department and triage nurse.

## 2016-01-02 NOTE — Addendum Note (Signed)
Addended by: Truitt Merle on: 01/02/2016 10:25 AM   Modules accepted: Orders

## 2016-01-02 NOTE — Progress Notes (Signed)
[facility]  [phone] [phone]  Clinic Follow Up Note   Patient Care Team: Dione Housekeeper, MD as PCP - General (Family Medicine) Tania Ade, RN as Registered Nurse (Oncology) 01/02/2016  CHIEF COMPLAINTS:  Follow-up of metastatic colon cancer  Oncology History   Metastatic colon cancer to liver   Staging form: Colon and Rectum, AJCC 7th Edition     Clinical: Stage Unknown ([place], NX, M1) - Unsigned        Metastatic colon cancer to liver ([place])   07/14/2014 Imaging    CT abdomen and pelvis with contrast showed a 6 cm segment of sigmoid bowel wall thickening with adjacent 14 x 12 mm nodules, diffuse hepatic metastasis. No bowel obstruction.      07/14/2014 Initial Diagnosis    Metastatic colon cancer to liver      07/25/2014 Procedure    Colonoscopy showed a near circumferential medial mass in the sigmoid colon, partially obstructing consistent with carcinoma.      07/25/2014 Initial Biopsy    Sigmoid: Biopsy showed invasive adenocarcinoma arising in a background of high-grade dysplasia.      07/28/2014 Imaging    CT chest showed no evidence of metastasis      08/09/2014 -  Chemotherapy    mFOLFOX6 every 2 weeks, dose reduction from cycle 7 due to cytopenia, Avastin added from cycle 3      08/11/2014 - 08/15/2014 Hospital Admission    Pt was admitted for fever and nausea, treated for UTI, liver biopsy cancelled due to the infection        HISTORY OF PRESENTING ILLNESS:  Michelle Erickson 60 y.o. female is here because of abnoaml CT findings which is highly suspecious for metastatic colon cancer.   She presented intermittent nausea, anorexia and abdominal pain since Nov 2015, she has mild pain in the LUQ, crapy pain, positional (bending over),   it happened once every 2-3 weeks, and it has been more frequent in the past month. She has normal BM, but did notice the caliber of the stool is  smaller than before. She denies any melena or hematochezia. She lost about 13 lbs in the past 6 months.   She presents to local emergency room in November 2015, and May 2016, was felt to be related to gastric virus. Due to the worsening symptoms lately, she went to [facility] ED on 07/13/2014, CT abdomen was obtained which showed multiple large liver metastasis and probable sigmoid colon mass. She was referred to [place] for further workup. She is scheduled to see GI Dr. Olevia Perches this afternoon.  CURRENT THERAPY: mFOLFOX6 and Avastin every 2 weeks, started on 08/09/2014   INTERIM HISTORY Michelle Erickson returns for follow-up and chemo. She is accompanied by her husband to the clinic today. She is doing well overall, and has been tolerating chemotherapy well. She denies significant neuropathy, nausea, diarrhea, or other symptoms. She has mild fatigue and taste change from chemotherapy, she is eating well, drinking fluids adequately. No other new complaints.  MEDICAL HISTORY Past Medical History:  Diagnosis Date  . Back pain   . Diabetes mellitus without complication ([place])   . Family history of colon cancer   . Hyperlipemia   . Hypertension   . Metastatic colon cancer to liver ([place]) 07/21/2014  . Snoring   . Wears glasses    contacts/glasses    SURGICAL HISTORY: Past Surgical History:  Procedure Laterality Date  . CATARACT EXTRACTION Bilateral   . MOUTH  SURGERY    . PORTACATH PLACEMENT N/A 08/03/2014   Procedure: INSERTION PORT-A-CATH;  Surgeon: Stark Klein, MD;  Location: [facility];  Service: General;  Laterality: N/A;    SOCIAL HISTORY: Social History   Social History  . Marital status: Married    Spouse name: Charlotte Crumb  . Number of children: 2  . Years of education: N/A   Occupational History  . Not on file.   Social History Main Topics  . Smoking status: Former Smoker    Packs/day: 1.00    Years: 6.00    Types: Cigarettes    Quit date: 01/21/1976  . Smokeless tobacco: Never Used  .  Alcohol use No  . Drug use: No  . Sexual activity: Yes    Birth control/ protection: Post-menopausal     Comment: # 2 pregnancies and #2 live births   Other Topics Concern  . Not on file   Social History Narrative   Married, husband [name]   Has #2 adult children   Previously worked at [employer] HISTORY: Family History  Problem Relation Age of Onset  . COPD Father   . Heart attack Father   . Arthritis Sister   . Diabetes Sister   . Colon cancer Sister     dx over 28  . Colon polyps Sister   . Seizures Brother   . Colon cancer Sister     dx in her early 7s, died in her early to mid 36s  . Diabetes Mother     ALLERGIES:  has No Known Allergies.  MEDICATIONS:  Current Outpatient Prescriptions  Medication Sig Dispense Refill  . amLODipine (NORVASC) 5 MG tablet Take 2 tablets (10 mg total) by mouth daily. 30 tablet 0  . atorvastatin (LIPITOR) 40 MG tablet Take by mouth.    . cetirizine (ZYRTEC) 10 MG tablet Take 10 mg by mouth daily as needed for allergies. Reported on 05/02/2015    . ferrous sulfate 325 (65 FE) MG tablet Take 325 mg by mouth daily with breakfast.    . Insulin Glargine (LANTUS SOLOSTAR) 100 UNIT/ML Solostar Pen Inject 23 Units into the skin daily at 10 pm.    . insulin lispro (HUMALOG) 100 UNIT/ML injection Inject 4-8 Units into the skin 2 (two) times daily as needed for high blood sugar. Sliding scale    . lidocaine-prilocaine (EMLA) cream     . ondansetron (ZOFRAN-ODT) 8 MG disintegrating tablet Take 8 mg by mouth every 8 (eight) hours as needed for nausea or vomiting. Reported on 05/02/2015     Current Facility-Administered Medications  Medication Dose Route Frequency Provider Last Rate Last Dose  . 0.9 %  sodium chloride infusion   Intravenous Continuous Truitt Merle, MD       Facility-Administered Medications Ordered in Other Visits  Medication Dose Route Frequency Provider Last Rate Last Dose  . 0.9 %  sodium chloride infusion    Intravenous Continuous Truitt Merle, MD        REVIEW OF SYSTEMS:   Constitutional: Denies fevers, chills or abnormal night sweats Eyes: Denies blurriness of vision, double vision or watery eyes Ears, nose, mouth, throat, and face: Denies mucositis or sore throat Respiratory: Denies cough, dyspnea or wheezes Cardiovascular: Denies palpitation, chest discomfort or lower extremity swelling Gastrointestinal:  Denies nausea, heartburn or change in bowel habits Skin: Denies abnormal skin rashes Lymphatics: Denies new lymphadenopathy or easy bruising Neurological:Denies numbness, tingling or new weaknesses Behavioral/Psych: Mood is stable, no  new changes  All other systems were reviewed with the patient and are negative.  PHYSICAL EXAMINATION: ECOG PERFORMANCE STATUS: 0  BP (!) 141/54 (BP Location: Left Arm, Patient Position: Sitting)   Pulse 82   Temp 98.2 F (36.8 C) (Oral)   Resp 18   Ht _0  (1.626 m)   Wt 161 lb 8 oz (73.3 kg)   SpO2 100%   BMI 27.72 kg/m  GENERAL:alert, no distress and comfortable SKIN: skin color, texture, turgor are normal, no rashes or significant lesions EYES: normal, conjunctiva are pink and non-injected, sclera clear OROPHARYNX:no exudate, no erythema and lips, buccal mucosa, and tongue normal  NECK: supple, thyroid normal size, non-tender, without nodularity LYMPH:  no palpable lymphadenopathy in the cervical, axillary or inguinal LUNGS: clear to auscultation and percussion with normal breathing effort HEART: regular rate & rhythm and no murmurs and no lower extremity edema ABDOMEN:abdomen soft, non-tender and normal bowel sounds Musculoskeletal:no cyanosis of digits and no clubbing  PSYCH: alert & oriented x 3 with fluent speech NEURO: no focal motor/sensory deficits  LABORATORY DATA:  I have reviewed the data as listed CBC Latest Ref Rng & Units 01/02/2016 12/19/2015 11/28/2015  WBC 3.9 - 10.3 10e3/uL 4.1 4.0 5.1  Hemoglobin 11.6 - 15.9 g/dL 8.3(L)  8.8(L) 9.0(L)  Hematocrit 34.8 - 46.6 % 26.3(L) 27.6(L) 28.0(L)  Platelets 145 - 400 10e3/uL 99(L) 139(L) 131(L)   CMP Latest Ref Rng & Units 01/02/2016 12/19/2015 11/28/2015  Glucose 70 - 140 mg/dl 155(H) 126 137  BUN 7.0 - 26.0 mg/dL 23.2 37.1(H) 23.0  Creatinine 0.6 - 1.1 mg/dL 1.5(H) 1.6(H) 1.5(H)  Sodium 136 - 145 mEq/L 140 139 141  Potassium 3.5 - 5.1 mEq/L 4.8 4.9 5.7(H)  Chloride 101 - 111 mmol/L - - -  CO2 22 - 29 mEq/L _1 Calcium 8.4 - 10.4 mg/dL 9.4 9.4 9.7  Total Protein 6.4 - 8.3 g/dL 6.9 7.1 7.2  Total Bilirubin 0.20 - 1.20 mg/dL 0.29 0.25 0.23  Alkaline Phos 40 - 150 U/L 136 124 136  AST 5 - 34 U/L 45(H) 30 39(H)  ALT 0 - 55 U/L 60(H) 25 38   CEA (0-5ng/ml) 07/21/2014: 594.5 11/23/2014: 18.2 03/07/2015: 6.6 09/19/2015: 37.9 10/29/2015: 63.7 11/28/2015: 28.99   PATHOLOGY REPORTS: Diagnosis 07/25/2014  Surgical [P], sigmoid - INVASIVE ADENOCARCINOMA ARISING IN A BACKGROUND OF HIGH GRADE DYSPLASIA. - SEE COMMENT. Microscopic Comment Results are phoned to Dr. Olevia Perches. Internal departmental review obtained (Dr. Donato Heinz) with agreement.  Diagnosis 09/05/2014 Liver, biopsy - METASTATIC ADENOCARCINOMA, SEE COMMENT. Microscopic Comment Given the patient's history of sigmoid invasive adenocarcinoma and the morphology, the findings are consistent with metastatic colorectal adenocarcinoma. The case was called to Dr. Burr Medico. Per request, MMR, MSI, and KRAS testing will be ordered.  KRAS MUTATION (+) c36G>A (p. G12D)  RADIOGRAPHIC STUDIES: I have personally reviewed the radiological images as listed and agreed with the findings in the report.   CT chest, abdomen and pelvis with contrast 10/29/2015 IMPRESSION: 1. New/enlarging pulmonary nodules, most notably the 8 by 6 mm left lower lobe pulmonary nodule, favoring metastatic disease. 2. The liver lesions are subjectively grossly similar in size, difficult to measure due to the lack of IV contrast causing  poor boundaries. 3. There is some faint heterogeneity of bony density in the sternum, but this is not significantly changed from 07/28/2014 and is probably incidental given the lack of other evidence of osseous metastatic disease. 4. Lower lumbar spondylosis and degenerative disc disease causing impingement.  5. Coronary, aortic arch, and branch vessel atherosclerotic vascular disease. Aortoiliac atherosclerotic vascular disease. 6. Mildly dilated esophagus with air-fluid level, probably reflecting dysmotility. 7. Cholelithiasis. 8. Thick wall urinary bladder, cystitis not excluded.   Colonoscopy 07/25/2014 Dr. Olevia Perches  ENDOSCOPIC IMPRESSION: Near circumferential medium mass was found in the sigmoid colon; multiple biopsies were performed using cold forceps, partially obstructing mass consistent with carcinoma. Placement of endoclips and tattoo at the margins of the mass which extends from 18-25 cm from the anus  ASSESSMENT & PLAN: 60 year old African-American female, presented with intermittent nausea vomiting and anemia.   1. Sigmoid colon cancer with liver metastasis, TxNxM1, stage IV, MSI stable, KRAS mutation(+) -I previously reviewed her CT scans, colonoscopy, and colon mass biopsy findings with patient and her husband in great details, images were reviewed in person  -I reviewed her liver biopsy results, which confirmed metastasis from colon cancer. -The natural history of metastatic colon cancer and treatment options were discussed with patient and her husband. Giving the diffuse metastasis in the liver, this is unfortunately incurable disease. I recommend systemic chemotherapy to control her metastatic disease, the goal of therapy is palliative and to prolong her life. -she is on first-line chemotherapy FOLFOX and Avastin every 2 weeks, tolerarting well. -her tumor MSI is stable, KRAS mutated, not a candidate for immunotherapy or EGFR inhibitor. -Her CEA has dropped significantly.  I reviewed her restaging CT scan from 07/18/2015 which showed stable disease overall, slightly increased of a few liver lesions.  -I recommend her to continue current first-line chemotherapy given the stable disease overall and excellent tolerance. We discussed she might develop resistance to the chemotherapy in the near future, and I would consider switching to second line treatment.  -We'll continue follow-up her CEA monthly, which has slightly increased lately  -She has taken two chemotherapy break lately -I reviewed her restaging CT scan images from 10/2015 with patient and her husband, due to the lack of contrast (renal insufficiency), it is difficult to compare, but her liver disease overall appears to be stable. However she has developed a few lung nodules, with the largest one 8 mm in the left lower lobe, concerning for new metastasis. This could be related to her chemotherapy break, or earlier disease progression  -Given the overall stable disease, I recommend her to restart FOLFOX and Avastin, without chemotherapy break for the next 2-3 months, and repeat staging scan. She agrees. -Lab reviewed, adequate for treatment, we'll continue chemotherapy -Restaging CT scan was scheduled for early this week, but she was not able to make it. We will reschedule for next week.  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 -Her ferritin is normal, serum iron and saturation slightly low, TIBC low normal, I suggest her to take oral iron supplement, she tolerates well, we'll continue once daily, with orange juice or vitamin C  -will repeat her iron study 0n 06/13/2015 showed adequate iron level, we'll repeat again -Her anemia has been slightly getting worse, likely secondary to chemotherapy, hemoglobin 8.3 today, she is  asymptomatic, we'll continue monitoring.  3. DM, HTN -Continue follow-up with PCP - we discussed the potential impact of chemotherapy and premedication dexamethasone on her blood pressure and sugar,will  Monitor closely. Her BG has been well controlled lately  -Her blood pressure has been high lately, we'll repeat in the infusion room.  I encouraged her to measure her blood pressure at home. We discussed Avastin can increase her blood pressure, need to be monitored closely. If SBP persistently high than 150, I'll consider adding on a new antihypertensive medication. She will follow-up with her primary care physician also.  4. 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 Mild thrombocytopenia -Secondary to chemotherapy. -will monitor closely.  PLan -Lab reviewed, mild anemia and thrombocytopenia, overall stable, adequate for treatment, continue FOLFOX and Avastin (if urine protein <300) today, with normal saline 500 mL over 1 hour today -Restaging CT scan in the next week -I'll see her back in 2 weeks before chemotherapy  All questions were answered. The patient knows to call the clinic with any problems, questions or concerns.  I spent 20 minutes counseling the patient face to face. The total time spent in the appointment was 25 minutes and more than 50% was on counseling.     Truitt Merle, MD 01/02/2016

## 2016-01-02 NOTE — Telephone Encounter (Signed)
Dr. Burr Medico made aware of lab result:   Protein, ur Negative- <30 mg/dL 100    . Verbal ok to treat given. Notified infusion nurse, Cloyde Reams RN

## 2016-01-02 NOTE — Telephone Encounter (Signed)
Message sent to chemo scheduler to be added. Appointments scheduled per 01/02/16 los. A copy of the AVS report and appointment schedule was given to the patient per 01/01/16 los.

## 2016-01-03 ENCOUNTER — Telehealth: Payer: Self-pay | Admitting: *Deleted

## 2016-01-03 NOTE — Telephone Encounter (Signed)
Per LOS I have scheduled appts and notified the scheduler 

## 2016-01-04 ENCOUNTER — Ambulatory Visit (HOSPITAL_BASED_OUTPATIENT_CLINIC_OR_DEPARTMENT_OTHER): Payer: BLUE CROSS/BLUE SHIELD

## 2016-01-04 VITALS — BP 121/71 | HR 95 | Temp 99.0°F | Resp 18

## 2016-01-04 DIAGNOSIS — C187 Malignant neoplasm of sigmoid colon: Secondary | ICD-10-CM

## 2016-01-04 DIAGNOSIS — C787 Secondary malignant neoplasm of liver and intrahepatic bile duct: Principal | ICD-10-CM

## 2016-01-04 DIAGNOSIS — Z452 Encounter for adjustment and management of vascular access device: Secondary | ICD-10-CM | POA: Diagnosis not present

## 2016-01-04 DIAGNOSIS — C189 Malignant neoplasm of colon, unspecified: Secondary | ICD-10-CM

## 2016-01-04 MED ORDER — HEPARIN SOD (PORK) LOCK FLUSH 100 UNIT/ML IV SOLN
500.0000 [IU] | Freq: Once | INTRAVENOUS | Status: AC | PRN
  Administered 2016-01-04: 500 [IU]
  Filled 2016-01-04: qty 5

## 2016-01-04 MED ORDER — SODIUM CHLORIDE 0.9 % IJ SOLN
10.0000 mL | INTRAMUSCULAR | Status: DC | PRN
  Administered 2016-01-04: 10 mL
  Filled 2016-01-04: qty 10

## 2016-01-04 NOTE — Patient Instructions (Signed)

## 2016-01-15 ENCOUNTER — Other Ambulatory Visit: Payer: Self-pay

## 2016-01-15 DIAGNOSIS — C787 Secondary malignant neoplasm of liver and intrahepatic bile duct: Principal | ICD-10-CM

## 2016-01-15 DIAGNOSIS — C189 Malignant neoplasm of colon, unspecified: Secondary | ICD-10-CM

## 2016-01-16 ENCOUNTER — Ambulatory Visit (HOSPITAL_BASED_OUTPATIENT_CLINIC_OR_DEPARTMENT_OTHER): Payer: BLUE CROSS/BLUE SHIELD

## 2016-01-16 ENCOUNTER — Other Ambulatory Visit (HOSPITAL_BASED_OUTPATIENT_CLINIC_OR_DEPARTMENT_OTHER): Payer: BLUE CROSS/BLUE SHIELD

## 2016-01-16 ENCOUNTER — Ambulatory Visit (HOSPITAL_BASED_OUTPATIENT_CLINIC_OR_DEPARTMENT_OTHER): Payer: BLUE CROSS/BLUE SHIELD | Admitting: Hematology

## 2016-01-16 VITALS — BP 163/68 | HR 94 | Temp 97.9°F | Resp 18 | Wt 159.5 lb

## 2016-01-16 DIAGNOSIS — I10 Essential (primary) hypertension: Secondary | ICD-10-CM

## 2016-01-16 DIAGNOSIS — D6959 Other secondary thrombocytopenia: Secondary | ICD-10-CM

## 2016-01-16 DIAGNOSIS — C787 Secondary malignant neoplasm of liver and intrahepatic bile duct: Secondary | ICD-10-CM

## 2016-01-16 DIAGNOSIS — Z5112 Encounter for antineoplastic immunotherapy: Secondary | ICD-10-CM | POA: Diagnosis not present

## 2016-01-16 DIAGNOSIS — C187 Malignant neoplasm of sigmoid colon: Secondary | ICD-10-CM

## 2016-01-16 DIAGNOSIS — C189 Malignant neoplasm of colon, unspecified: Secondary | ICD-10-CM | POA: Diagnosis not present

## 2016-01-16 DIAGNOSIS — E119 Type 2 diabetes mellitus without complications: Secondary | ICD-10-CM | POA: Diagnosis not present

## 2016-01-16 DIAGNOSIS — Z95828 Presence of other vascular implants and grafts: Secondary | ICD-10-CM

## 2016-01-16 DIAGNOSIS — D630 Anemia in neoplastic disease: Secondary | ICD-10-CM

## 2016-01-16 DIAGNOSIS — Z452 Encounter for adjustment and management of vascular access device: Secondary | ICD-10-CM

## 2016-01-16 DIAGNOSIS — N189 Chronic kidney disease, unspecified: Secondary | ICD-10-CM

## 2016-01-16 DIAGNOSIS — Z5111 Encounter for antineoplastic chemotherapy: Secondary | ICD-10-CM | POA: Diagnosis not present

## 2016-01-16 LAB — CBC WITH DIFFERENTIAL/PLATELET
BASO%: 0.4 % (ref 0.0–2.0)
Basophils Absolute: 0 10*3/uL (ref 0.0–0.1)
EOS%: 3 % (ref 0.0–7.0)
Eosinophils Absolute: 0.1 10*3/uL (ref 0.0–0.5)
HCT: 26.5 % — ABNORMAL LOW (ref 34.8–46.6)
HGB: 8.4 g/dL — ABNORMAL LOW (ref 11.6–15.9)
LYMPH#: 1.1 10*3/uL (ref 0.9–3.3)
LYMPH%: 22.4 % (ref 14.0–49.7)
MCH: 30.2 pg (ref 25.1–34.0)
MCHC: 31.7 g/dL (ref 31.5–36.0)
MCV: 95.3 fL (ref 79.5–101.0)
MONO#: 0.7 10*3/uL (ref 0.1–0.9)
MONO%: 14.8 % — ABNORMAL HIGH (ref 0.0–14.0)
NEUT%: 59.4 % (ref 38.4–76.8)
NEUTROS ABS: 2.8 10*3/uL (ref 1.5–6.5)
Platelets: 115 10*3/uL — ABNORMAL LOW (ref 145–400)
RBC: 2.78 10*6/uL — AB (ref 3.70–5.45)
RDW: 16.5 % — ABNORMAL HIGH (ref 11.2–14.5)
WBC: 4.7 10*3/uL (ref 3.9–10.3)

## 2016-01-16 LAB — COMPREHENSIVE METABOLIC PANEL
ALT: 25 U/L (ref 0–55)
AST: 30 U/L (ref 5–34)
Albumin: 3.3 g/dL — ABNORMAL LOW (ref 3.5–5.0)
Alkaline Phosphatase: 121 U/L (ref 40–150)
Anion Gap: 7 mEq/L (ref 3–11)
BUN: 25.5 mg/dL (ref 7.0–26.0)
CHLORIDE: 107 meq/L (ref 98–109)
CO2: 24 meq/L (ref 22–29)
Calcium: 9.4 mg/dL (ref 8.4–10.4)
Creatinine: 1.6 mg/dL — ABNORMAL HIGH (ref 0.6–1.1)
EGFR: 39 mL/min/{1.73_m2} — ABNORMAL LOW (ref >90)
GLUCOSE: 115 mg/dL (ref 70–140)
Potassium: 5.2 mEq/L — ABNORMAL HIGH (ref 3.5–5.1)
SODIUM: 138 meq/L (ref 136–145)
Total Bilirubin: 0.29 mg/dL (ref 0.20–1.20)
Total Protein: 7 g/dL (ref 6.4–8.3)

## 2016-01-16 LAB — UA PROTEIN, DIPSTICK - CHCC: PROTEIN: 100 mg/dL

## 2016-01-16 MED ORDER — DEXAMETHASONE SODIUM PHOSPHATE 10 MG/ML IJ SOLN
INTRAMUSCULAR | Status: AC
  Filled 2016-01-16: qty 1

## 2016-01-16 MED ORDER — DEXAMETHASONE SODIUM PHOSPHATE 10 MG/ML IJ SOLN
10.0000 mg | Freq: Once | INTRAMUSCULAR | Status: AC
  Administered 2016-01-16: 10 mg via INTRAVENOUS

## 2016-01-16 MED ORDER — DEXTROSE 5 % IV SOLN
Freq: Once | INTRAVENOUS | Status: AC
  Administered 2016-01-16: 11:00:00 via INTRAVENOUS

## 2016-01-16 MED ORDER — ONDANSETRON HCL 8 MG PO TABS
ORAL_TABLET | ORAL | Status: AC
  Filled 2016-01-16: qty 1

## 2016-01-16 MED ORDER — SODIUM CHLORIDE 0.9 % IV SOLN
5.0000 mg/kg | Freq: Once | INTRAVENOUS | Status: AC
  Administered 2016-01-16: 350 mg via INTRAVENOUS
  Filled 2016-01-16: qty 14

## 2016-01-16 MED ORDER — LEUCOVORIN CALCIUM INJECTION 350 MG
400.0000 mg/m2 | Freq: Once | INTRAVENOUS | Status: AC
  Administered 2016-01-16: 716 mg via INTRAVENOUS
  Filled 2016-01-16: qty 35.8

## 2016-01-16 MED ORDER — SODIUM CHLORIDE 0.9 % IV SOLN
Freq: Once | INTRAVENOUS | Status: DC
Start: 2016-01-16 — End: 2016-01-16

## 2016-01-16 MED ORDER — SODIUM CHLORIDE 0.9 % IV SOLN
2200.0000 mg/m2 | INTRAVENOUS | Status: DC
  Administered 2016-01-16: 3950 mg via INTRAVENOUS
  Filled 2016-01-16: qty 79

## 2016-01-16 MED ORDER — SODIUM CHLORIDE 0.9 % IJ SOLN
500.0000 mL | Freq: Once | INTRAMUSCULAR | Status: AC
  Administered 2016-01-16: 500 mL
  Filled 2016-01-16: qty 500

## 2016-01-16 MED ORDER — ONDANSETRON HCL 8 MG PO TABS
8.0000 mg | ORAL_TABLET | Freq: Once | ORAL | Status: AC
  Administered 2016-01-16: 8 mg via ORAL

## 2016-01-16 MED ORDER — SODIUM CHLORIDE 0.9 % IJ SOLN
10.0000 mL | INTRAMUSCULAR | Status: DC | PRN
  Administered 2016-01-16: 10 mL via INTRAVENOUS
  Filled 2016-01-16: qty 10

## 2016-01-16 MED ORDER — HEPARIN SOD (PORK) LOCK FLUSH 100 UNIT/ML IV SOLN
500.0000 [IU] | Freq: Once | INTRAVENOUS | Status: DC | PRN
  Filled 2016-01-16: qty 5

## 2016-01-16 MED ORDER — OXALIPLATIN CHEMO INJECTION 100 MG/20ML
70.0000 mg/m2 | Freq: Once | INTRAVENOUS | Status: AC
  Administered 2016-01-16: 125 mg via INTRAVENOUS
  Filled 2016-01-16: qty 20

## 2016-01-16 NOTE — Progress Notes (Signed)
Michelle Erickson  [phone] Fax:([phone]) [phone]  Clinic Follow Up Note   Patient Care Team: Dione Housekeeper, MD as PCP - General (Family Medicine) Tania Ade, RN as Registered Nurse (Oncology) 01/16/2016  CHIEF COMPLAINTS:  Follow-up of metastatic colon cancer  Oncology History   Metastatic colon cancer to liver   Staging form: Colon and Rectum, AJCC 7th Edition     Clinical: Stage Unknown ([place], NX, M1) - Unsigned        Metastatic colon cancer to liver ([place])   07/14/2014 Imaging    CT abdomen and pelvis with contrast showed a 6 cm segment of sigmoid bowel wall thickening with adjacent 14 x 12 mm nodules, diffuse hepatic metastasis. No bowel obstruction.      07/14/2014 Initial Diagnosis    Metastatic colon cancer to liver      07/25/2014 Procedure    Colonoscopy showed a near circumferential medial mass in the sigmoid colon, partially obstructing consistent with carcinoma.      07/25/2014 Initial Biopsy    Sigmoid: Biopsy showed invasive adenocarcinoma arising in a background of high-grade dysplasia.      07/28/2014 Imaging    CT chest showed no evidence of metastasis      08/09/2014 -  Chemotherapy    mFOLFOX6 every 2 weeks, dose reduction from cycle 7 due to cytopenia, Avastin added from cycle 3      08/11/2014 - 08/15/2014 Hospital Admission    Pt was admitted for fever and nausea, treated for UTI, liver biopsy cancelled due to the infection       10/29/2015 Imaging    CT chest, abdomen and pelvis with contrast 10/29/2015 IMPRESSION: 1. New/enlarging pulmonary nodules, most notably the 8 by 6 mm left lower lobe pulmonary nodule, favoring metastatic disease. 2. The liver lesions are subjectively grossly similar in size, difficult to measure due to the lack of IV contrast causing poor boundaries. 3. There is some faint heterogeneity of bony density in the sternum, but this is not significantly  changed from 07/28/2014 and is probably incidental given the lack of other evidence of osseous metastatic disease. 4. Lower lumbar spondylosis and degenerative disc disease causing impingement. 5. Coronary, aortic arch, and branch vessel atherosclerotic vascular disease. Aortoiliac atherosclerotic vascular disease. 6. Mildly dilated esophagus with air-fluid level, probably reflecting dysmotility. 7. Cholelithiasis. 8. Thick wall urinary bladder, cystitis not excluded.       HISTORY OF PRESENTING ILLNESS:  Michelle Erickson 60 y.o. female is here because of abnoaml CT findings which is highly suspecious for metastatic colon cancer.   She presented intermittent nausea, anorexia and abdominal pain since Nov 2015, she has mild pain in the LUQ, crapy pain, positional (bending over),   it happened once every 2-3 weeks, and it has been more frequent in the past month. She has normal BM, but did notice the caliber of the stool is smaller than before. She denies any melena or hematochezia. She lost about 13 lbs in the past 6 months.   She presents to local emergency room in November 2015, and May 2016, was felt to be related to gastric virus. Due to the worsening symptoms lately, she went to [facility] ED on 07/13/2014, CT abdomen was obtained which showed multiple large liver metastasis and probable sigmoid colon mass. She was referred to [place] for further workup. She is scheduled to see GI Dr. Olevia Perches this afternoon.  CURRENT THERAPY: mFOLFOX6 and Avastin every 2 weeks, started on 08/09/2014  INTERIM HISTORY Michelle Erickson returns for follow-up and chemo. The patient missed her CT scan and this has been re-scheduled on 01/28/16. Doing well overall and tolerating chemotherapy.  She denies significant neuropathy, nausea, diarrhea, or other symptoms. She has mild fatigue, but states it is good and rests at times, Taste change from chemotherapy, she is eating well, drinking fluids adequately. No other new  complaints. She is accompanied by her husband. Denies use of Ibuprofen or Aspirin.  MEDICAL HISTORY Past Medical History:  Diagnosis Date  . Back pain   . Diabetes mellitus without complication ([place])   . Family history of colon cancer   . Hyperlipemia   . Hypertension   . Metastatic colon cancer to liver ([place]) 07/21/2014  . Snoring   . Wears glasses    contacts/glasses    SURGICAL HISTORY: Past Surgical History:  Procedure Laterality Date  . CATARACT EXTRACTION Bilateral   . MOUTH SURGERY    . PORTACATH PLACEMENT N/A 08/03/2014   Procedure: INSERTION PORT-A-CATH;  Surgeon: Stark Klein, MD;  Location: [facility];  Service: General;  Laterality: N/A;    SOCIAL HISTORY: Social History   Social History  . Marital status: Married    Spouse name: Charlotte Crumb  . Number of children: 2  . Years of education: N/A   Occupational History  . Not on file.   Social History Main Topics  . Smoking status: Former Smoker    Packs/day: 1.00    Years: 6.00    Types: Cigarettes    Quit date: 01/21/1976  . Smokeless tobacco: Never Used  . Alcohol use No  . Drug use: No  . Sexual activity: Yes    Birth control/ protection: Post-menopausal     Comment: # 2 pregnancies and #2 live births   Other Topics Concern  . Not on file   Social History Narrative   Married, husband [name]   Has #2 adult children   Previously worked at [employer] HISTORY: Family History  Problem Relation Age of Onset  . COPD Father   . Heart attack Father   . Arthritis Sister   . Diabetes Sister   . Colon cancer Sister     dx over 95  . Colon polyps Sister   . Seizures Brother   . Colon cancer Sister     dx in her early 33s, died in her early to mid 51s  . Diabetes Mother     ALLERGIES:  has No Known Allergies.  MEDICATIONS:  Current Outpatient Prescriptions  Medication Sig Dispense Refill  . amLODipine (NORVASC) 5 MG tablet Take 2 tablets (10 mg total) by mouth daily. 30 tablet 0  .  atorvastatin (LIPITOR) 40 MG tablet Take by mouth.    . cetirizine (ZYRTEC) 10 MG tablet Take 10 mg by mouth daily as needed for allergies. Reported on 05/02/2015    . ferrous sulfate 325 (65 FE) MG tablet Take 325 mg by mouth daily with breakfast.    . Insulin Glargine (LANTUS SOLOSTAR) 100 UNIT/ML Solostar Pen Inject 23 Units into the skin daily at 10 pm.    . insulin lispro (HUMALOG) 100 UNIT/ML injection Inject 4-8 Units into the skin 2 (two) times daily as needed for high blood sugar. Sliding scale    . lidocaine-prilocaine (EMLA) cream     . ondansetron (ZOFRAN-ODT) 8 MG disintegrating tablet Take 8 mg by mouth every 8 (eight) hours as needed for nausea or vomiting. Reported on 05/02/2015  No current facility-administered medications for this visit.     REVIEW OF SYSTEMS:   Constitutional: Denies fevers, chills or abnormal night sweats. (+) Mild fatigue Eyes: Denies blurriness of vision, double vision or watery eyes Ears, nose, mouth, throat, and face: Denies mucositis or sore throat Respiratory: Denies cough, dyspnea or wheezes Cardiovascular: Denies palpitation, chest discomfort or lower extremity swelling Gastrointestinal:  Denies nausea, heartburn or change in bowel habits Skin: Denies abnormal skin rashes Lymphatics: Denies new lymphadenopathy or easy bruising Neurological:Denies numbness, tingling or new weaknesses Behavioral/Psych: Mood is stable, no new changes  All other systems were reviewed with the patient and are negative.  PHYSICAL EXAMINATION: ECOG PERFORMANCE STATUS: 0  BP (!) 163/68 (BP Location: Left Arm, Patient Position: Sitting)   Pulse 94   Temp 97.9 F (36.6 C) (Oral)   Resp 18   Wt 159 lb 8 oz (72.3 kg)   SpO2 100%   BMI 27.38 kg/m  GENERAL:alert, no distress and comfortable SKIN: skin color, texture, turgor are normal, no rashes or significant lesions EYES: normal, conjunctiva are pink and non-injected, sclera clear OROPHARYNX:no exudate, no  erythema and lips, buccal mucosa, and tongue normal  NECK: supple, thyroid normal size, non-tender, without nodularity LYMPH:  no palpable lymphadenopathy in the cervical, axillary or inguinal LUNGS: clear to auscultation and percussion with normal breathing effort HEART: regular rate & rhythm and no murmurs and no lower extremity edema ABDOMEN:abdomen soft, non-tender and normal bowel sounds Musculoskeletal:no cyanosis of digits and no clubbing  PSYCH: alert & oriented x 3 with fluent speech NEURO: no focal motor/sensory deficits  LABORATORY DATA:  I have reviewed the data as listed CBC Latest Ref Rng & Units 01/16/2016 01/02/2016 12/19/2015  WBC 3.9 - 10.3 10e3/uL 4.7 4.1 4.0  Hemoglobin 11.6 - 15.9 g/dL 8.4(L) 8.3(L) 8.8(L)  Hematocrit 34.8 - 46.6 % 26.5(L) 26.3(L) 27.6(L)  Platelets 145 - 400 10e3/uL 115(L) 99(L) 139(L)   CMP Latest Ref Rng & Units 01/16/2016 01/02/2016 12/19/2015  Glucose 70 - 140 mg/dl 115 155(H) 126  BUN 7.0 - 26.0 mg/dL 25.5 23.2 37.1(H)  Creatinine 0.6 - 1.1 mg/dL 1.6(H) 1.5(H) 1.6(H)  Sodium 136 - 145 mEq/L 138 140 139  Potassium 3.5 - 5.1 mEq/L 5.2(H) 4.8 4.9  Chloride 101 - 111 mmol/L - - -  CO2 22 - 29 mEq/L _0 Calcium 8.4 - 10.4 mg/dL 9.4 9.4 9.4  Total Protein 6.4 - 8.3 g/dL 7.0 6.9 7.1  Total Bilirubin 0.20 - 1.20 mg/dL 0.29 0.29 0.25  Alkaline Phos 40 - 150 U/L 121 136 124  AST 5 - 34 U/L 30 45(H) 30  ALT 0 - 55 U/L 25 60(H) 25   CEA (0-5ng/ml) 07/21/2014: 594.5 11/23/2014: 18.2 03/07/2015: 6.6 09/19/2015: 37.9 10/29/2015: 63.7 11/28/2015: 28.99 01/02/2016: 40.81  PATHOLOGY REPORTS: Diagnosis 07/25/2014  Surgical [P], sigmoid - INVASIVE ADENOCARCINOMA ARISING IN A BACKGROUND OF HIGH GRADE DYSPLASIA. - SEE COMMENT. Microscopic Comment Results are phoned to Dr. Olevia Perches. Internal departmental review obtained (Dr. Donato Heinz) with agreement.  Diagnosis 09/05/2014 Liver, biopsy - METASTATIC ADENOCARCINOMA, SEE COMMENT. Microscopic Comment Given  the patient's history of sigmoid invasive adenocarcinoma and the morphology, the findings are consistent with metastatic colorectal adenocarcinoma. The case was called to Dr. Burr Medico. Per request, MMR, MSI, and KRAS testing will be ordered.  KRAS MUTATION (+) c36G>A (p. G12D)  RADIOGRAPHIC STUDIES: I have personally reviewed the radiological images as listed and agreed with the findings in the report.  CT chest, abdomen and pelvis with  contrast 10/29/2015 IMPRESSION: 1. New/enlarging pulmonary nodules, most notably the 8 by 6 mm left lower lobe pulmonary nodule, favoring metastatic disease. 2. The liver lesions are subjectively grossly similar in size, difficult to measure due to the lack of IV contrast causing poor boundaries. 3. There is some faint heterogeneity of bony density in the sternum, but this is not significantly changed from 07/28/2014 and is probably incidental given the lack of other evidence of osseous metastatic disease. 4. Lower lumbar spondylosis and degenerative disc disease causing impingement. 5. Coronary, aortic arch, and branch vessel atherosclerotic vascular disease. Aortoiliac atherosclerotic vascular disease. 6. Mildly dilated esophagus with air-fluid level, probably reflecting dysmotility. 7. Cholelithiasis. 8. Thick wall urinary bladder, cystitis not excluded.  Colonoscopy 07/25/2014 Dr. Olevia Perches  ENDOSCOPIC IMPRESSION: Near circumferential medium mass was found in the sigmoid colon; multiple biopsies were performed using cold forceps, partially obstructing mass consistent with carcinoma. Placement of endoclips and tattoo at the margins of the mass which extends from 18-25 cm from the anus  ASSESSMENT & PLAN: 61 y.o. African-American female, presented with intermittent nausea vomiting and anemia.   1. Sigmoid colon cancer with liver metastasis, TxNxM1, stage IV, MSI stable, KRAS mutation(+) -I previously reviewed her CT scans, colonoscopy, and colon mass  biopsy findings with patient and her husband in great details, images were reviewed in person  -I reviewed her liver biopsy results, which confirmed metastasis from colon cancer. -The natural history of metastatic colon cancer and treatment options were discussed with patient and her husband. Giving the diffuse metastasis in the liver, this is unfortunately incurable disease. I recommend systemic chemotherapy to control her metastatic disease, the goal of therapy is palliative and to prolong her life. -she is on first-line chemotherapy FOLFOX and Avastin every 2 weeks, tolerarting well. -her tumor MSI is stable, KRAS mutated, not a candidate for immunotherapy or EGFR inhibitor. -Her CEA has dropped significantly. I previously reviewed her restaging CT scan from 07/18/2015 which showed stable disease overall, slightly increased of a few liver lesions.  -I recommend her to continue current first-line chemotherapy given the stable disease overall and excellent tolerance. We discussed she might develop resistance to the chemotherapy in the near future, and I would consider switching to second line treatment.  -We'll continue follow-up her CEA monthly, which has slightly increased lately. -She has taken two chemotherapy break lately. -I previously reviewed her restaging CT scan images from 10/2015 with patient and her husband, due to the lack of contrast (renal insufficiency), it is difficult to compare, but her liver disease overall appears to be stable. However she has developed a few lung nodules, with the largest one 8 mm in the left lower lobe, concerning for new metastasis. This could be related to her chemotherapy break, or earlier disease progression  -Given the overall stable disease, I recommend her to restart FOLFOX and Avastin, without chemotherapy break for the next 2-3 months, and repeat staging scan. She agrees. -Lab reviewed, adequate for treatment, we'll continue chemotherapy -Restaging CT  scan was scheduled for early 12/2015, but she was not able to make it. This has been rescheduled for 01/28/2016.  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 -Her ferritin is normal, serum iron and saturation slightly low, TIBC low normal, I suggest her to take oral iron supplement, she tolerates well, we'll continue once daily, with  orange juice or vitamin C  -Repeat her iron study on 06/13/2015 showed adequate iron level, we'll repeat again -Her anemia has been slightly getting worse, likely secondary to chemotherapy, hemoglobin 8.4 TODAY, she is asymptomatic, we'll continue monitoring.  3. DM, HTN -Continue follow-up with PCP - we discussed the potential impact of chemotherapy and premedication dexamethasone on her blood pressure and sugar,will  Monitor closely. Her BG has been well controlled lately  -Her blood pressure has been high lately, we'll repeat in the infusion room. I encouraged her to measure her blood pressure at home. We discussed Avastin can increase her blood pressure, need to be monitored closely. If SBP persistently high than 150, I'll consider adding on a new antihypertensive medication. She will follow-up with her primary care physician also.  4. 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 Will continue monitoring renal function  -I encourage her to drink more fluids -I'll give her IV fluids in the infusion today   5. Mild thrombocytopenia -Secondary to chemotherapy. -will monitor closely.  PLan -Lab reviewed, mild anemia and thrombocytopenia, overall stable, adequate for treatment, continue FOLFOX and Avastin (if urine protein <300) today, with normal saline 500 mL over 1 hour today -Restaging CT scan on 01/28/2016. -I'll see her back on 01/30/2016 before  chemotherapy.  All questions were answered. The patient knows to call the clinic with any problems, questions or concerns.  I spent 20 minutes counseling the patient face to face. The total time spent in the appointment was 25 minutes and more than 50% was on counseling.     Truitt Merle, MD 01/19/2016   This document serves as a record of services personally performed by Truitt Merle, MD. It was created on her behalf by Darcus Austin, a trained medical scribe. The creation of this record is based on the scribe's personal observations and the provider's statements to them. This document has been checked and approved by the attending provider.

## 2016-01-16 NOTE — Progress Notes (Signed)
Dr. Burr Medico saw pt today prior to chemo.  OK to treat with urine protein 100 for Avastin;  OK for chemo with Crea 1.6 -  Pt to receive extra NS with chemo as per md.

## 2016-01-16 NOTE — Patient Instructions (Signed)
[facility] Discharge Instructions for Patients Receiving Chemotherapy  Today you received the following chemotherapy agents Leucovorin, Oxaliplatin and Adrucil, and Avastin  To help prevent nausea and vomiting after your treatment, we encourage you to take your nausea medication as directed.    If you develop nausea and vomiting that is not controlled by your nausea medication, call the clinic.   BELOW ARE SYMPTOMS THAT SHOULD BE REPORTED IMMEDIATELY:  *FEVER GREATER THAN 100.5 F  *CHILLS WITH OR WITHOUT FEVER  NAUSEA AND VOMITING THAT IS NOT CONTROLLED WITH YOUR NAUSEA MEDICATION  *UNUSUAL SHORTNESS OF BREATH  *UNUSUAL BRUISING OR BLEEDING  TENDERNESS IN MOUTH AND THROAT WITH OR WITHOUT PRESENCE OF ULCERS  *URINARY PROBLEMS  *BOWEL PROBLEMS  UNUSUAL RASH Items with * indicate a potential emergency and should be followed up as soon as possible.  Feel free to call the clinic you have any questions or concerns. The clinic phone number is [phone].  Please show the [place] at check-in to the Emergency Department and triage nurse.

## 2016-01-16 NOTE — Patient Instructions (Signed)

## 2016-01-18 ENCOUNTER — Ambulatory Visit (HOSPITAL_BASED_OUTPATIENT_CLINIC_OR_DEPARTMENT_OTHER): Payer: BLUE CROSS/BLUE SHIELD

## 2016-01-18 VITALS — BP 144/62 | HR 80 | Temp 97.9°F | Resp 17

## 2016-01-18 DIAGNOSIS — C787 Secondary malignant neoplasm of liver and intrahepatic bile duct: Principal | ICD-10-CM

## 2016-01-18 DIAGNOSIS — Z452 Encounter for adjustment and management of vascular access device: Secondary | ICD-10-CM | POA: Diagnosis not present

## 2016-01-18 DIAGNOSIS — C189 Malignant neoplasm of colon, unspecified: Secondary | ICD-10-CM

## 2016-01-18 DIAGNOSIS — C187 Malignant neoplasm of sigmoid colon: Secondary | ICD-10-CM

## 2016-01-18 MED ORDER — SODIUM CHLORIDE 0.9 % IJ SOLN
10.0000 mL | INTRAMUSCULAR | Status: DC | PRN
  Administered 2016-01-18: 10 mL
  Filled 2016-01-18: qty 10

## 2016-01-18 MED ORDER — HEPARIN SOD (PORK) LOCK FLUSH 100 UNIT/ML IV SOLN
500.0000 [IU] | Freq: Once | INTRAVENOUS | Status: AC | PRN
  Administered 2016-01-18: 500 [IU]
  Filled 2016-01-18: qty 5

## 2016-01-18 NOTE — Patient Instructions (Signed)

## 2016-01-19 ENCOUNTER — Encounter: Payer: Self-pay | Admitting: Hematology

## 2016-01-28 ENCOUNTER — Ambulatory Visit: Payer: BLUE CROSS/BLUE SHIELD

## 2016-01-28 ENCOUNTER — Ambulatory Visit (HOSPITAL_COMMUNITY)
Admission: RE | Admit: 2016-01-28 | Discharge: 2016-01-28 | Disposition: A | Discharge status: Home / Self Care | Payer: BLUE CROSS/BLUE SHIELD | Source: Ambulatory Visit | Attending: Hematology | Admitting: Hematology

## 2016-01-28 ENCOUNTER — Other Ambulatory Visit: Payer: Self-pay | Admitting: Hematology

## 2016-01-28 ENCOUNTER — Ambulatory Visit (HOSPITAL_BASED_OUTPATIENT_CLINIC_OR_DEPARTMENT_OTHER): Payer: BLUE CROSS/BLUE SHIELD

## 2016-01-28 VITALS — BP 161/78 | HR 85 | Temp 98.4°F | Resp 18

## 2016-01-28 DIAGNOSIS — C189 Malignant neoplasm of colon, unspecified: Secondary | ICD-10-CM

## 2016-01-28 DIAGNOSIS — I700 Atherosclerosis of aorta: Secondary | ICD-10-CM | POA: Diagnosis not present

## 2016-01-28 DIAGNOSIS — C787 Secondary malignant neoplasm of liver and intrahepatic bile duct: Principal | ICD-10-CM

## 2016-01-28 DIAGNOSIS — K228 Other specified diseases of esophagus: Secondary | ICD-10-CM | POA: Diagnosis not present

## 2016-01-28 DIAGNOSIS — C7800 Secondary malignant neoplasm of unspecified lung: Secondary | ICD-10-CM | POA: Insufficient documentation

## 2016-01-28 DIAGNOSIS — C187 Malignant neoplasm of sigmoid colon: Secondary | ICD-10-CM | POA: Diagnosis not present

## 2016-01-28 DIAGNOSIS — K8020 Calculus of gallbladder without cholecystitis without obstruction: Secondary | ICD-10-CM | POA: Diagnosis not present

## 2016-01-28 DIAGNOSIS — Z95828 Presence of other vascular implants and grafts: Secondary | ICD-10-CM

## 2016-01-28 LAB — COMPREHENSIVE METABOLIC PANEL
ALT: 49 U/L (ref 0–55)
ANION GAP: 8 meq/L (ref 3–11)
AST: 59 U/L — AB (ref 5–34)
Albumin: 3.4 g/dL — ABNORMAL LOW (ref 3.5–5.0)
Alkaline Phosphatase: 140 U/L (ref 40–150)
BUN: 23 mg/dL (ref 7.0–26.0)
CO2: 24 meq/L (ref 22–29)
CREATININE: 1.6 mg/dL — AB (ref 0.6–1.1)
Calcium: 9.6 mg/dL (ref 8.4–10.4)
Chloride: 109 mEq/L (ref 98–109)
EGFR: 39 mL/min/{1.73_m2} — ABNORMAL LOW (ref >90)
GLUCOSE: 66 mg/dL — AB (ref 70–140)
Potassium: 4.7 mEq/L (ref 3.5–5.1)
SODIUM: 140 meq/L (ref 136–145)
Total Bilirubin: 0.27 mg/dL (ref 0.20–1.20)
Total Protein: 7.2 g/dL (ref 6.4–8.3)

## 2016-01-28 LAB — CBC WITH DIFFERENTIAL/PLATELET
BASO%: 0.6 % (ref 0.0–2.0)
Basophils Absolute: 0 10*3/uL (ref 0.0–0.1)
EOS%: 4.2 % (ref 0.0–7.0)
Eosinophils Absolute: 0.2 10*3/uL (ref 0.0–0.5)
HCT: 25.7 % — ABNORMAL LOW (ref 34.8–46.6)
HGB: 8.4 g/dL — ABNORMAL LOW (ref 11.6–15.9)
LYMPH%: 21.4 % (ref 14.0–49.7)
MCH: 31.1 pg (ref 25.1–34.0)
MCHC: 32.7 g/dL (ref 31.5–36.0)
MCV: 94.9 fL (ref 79.5–101.0)
MONO#: 0.9 10*3/uL (ref 0.1–0.9)
MONO%: 18.2 % — AB (ref 0.0–14.0)
NEUT%: 55.6 % (ref 38.4–76.8)
NEUTROS ABS: 2.7 10*3/uL (ref 1.5–6.5)
Platelets: 118 10*3/uL — ABNORMAL LOW (ref 145–400)
RBC: 2.71 10*6/uL — AB (ref 3.70–5.45)
RDW: 17.4 % — ABNORMAL HIGH (ref 11.2–14.5)
WBC: 4.8 10*3/uL (ref 3.9–10.3)
lymph#: 1 10*3/uL (ref 0.9–3.3)

## 2016-01-28 LAB — CEA (IN HOUSE-CHCC): CEA (CHCC-IN HOUSE): 47.04 ng/mL — AB (ref 0.00–5.00)

## 2016-01-28 MED ORDER — IOPAMIDOL (ISOVUE-300) INJECTION 61%
INTRAVENOUS | Status: AC
  Administered 2016-01-28: 30 mL via ORAL
  Filled 2016-01-28: qty 30

## 2016-01-28 MED ORDER — HEPARIN SOD (PORK) LOCK FLUSH 100 UNIT/ML IV SOLN
500.0000 [IU] | Freq: Once | INTRAVENOUS | Status: DC | PRN
  Filled 2016-01-28: qty 5

## 2016-01-28 MED ORDER — SODIUM CHLORIDE 0.9 % IJ SOLN
10.0000 mL | INTRAMUSCULAR | Status: DC | PRN
  Filled 2016-01-28: qty 10

## 2016-01-28 NOTE — Progress Notes (Signed)
Port assess using [employer] Kit; Patient to CT for scan.  Labs drawn.

## 2016-01-30 ENCOUNTER — Ambulatory Visit (HOSPITAL_BASED_OUTPATIENT_CLINIC_OR_DEPARTMENT_OTHER): Payer: BLUE CROSS/BLUE SHIELD | Admitting: Hematology

## 2016-01-30 ENCOUNTER — Telehealth: Payer: Self-pay | Admitting: Hematology

## 2016-01-30 ENCOUNTER — Other Ambulatory Visit: Payer: BLUE CROSS/BLUE SHIELD

## 2016-01-30 ENCOUNTER — Ambulatory Visit: Payer: BLUE CROSS/BLUE SHIELD

## 2016-01-30 VITALS — BP 134/78 | HR 87 | Temp 98.3°F | Resp 18 | Ht 64.0 in | Wt 158.7 lb

## 2016-01-30 DIAGNOSIS — D630 Anemia in neoplastic disease: Secondary | ICD-10-CM | POA: Diagnosis not present

## 2016-01-30 DIAGNOSIS — C787 Secondary malignant neoplasm of liver and intrahepatic bile duct: Secondary | ICD-10-CM

## 2016-01-30 DIAGNOSIS — R808 Other proteinuria: Secondary | ICD-10-CM

## 2016-01-30 DIAGNOSIS — C189 Malignant neoplasm of colon, unspecified: Secondary | ICD-10-CM

## 2016-01-30 DIAGNOSIS — C187 Malignant neoplasm of sigmoid colon: Secondary | ICD-10-CM

## 2016-01-30 DIAGNOSIS — I10 Essential (primary) hypertension: Secondary | ICD-10-CM

## 2016-01-30 DIAGNOSIS — E119 Type 2 diabetes mellitus without complications: Secondary | ICD-10-CM

## 2016-01-30 DIAGNOSIS — D6959 Other secondary thrombocytopenia: Secondary | ICD-10-CM

## 2016-01-30 DIAGNOSIS — N189 Chronic kidney disease, unspecified: Secondary | ICD-10-CM

## 2016-01-30 DIAGNOSIS — Z7189 Other specified counseling: Secondary | ICD-10-CM

## 2016-01-30 NOTE — Telephone Encounter (Signed)
Appointments scheduled per 1/10 LOS. Patient given AVS report and calendars with future scheduled appointments. °

## 2016-01-30 NOTE — Progress Notes (Signed)
[facility]  [phone] [phone]  Clinic Follow Up Note   Patient Care Team: Dione Housekeeper, MD as PCP - General (Family Medicine) Tania Ade, RN as Registered Nurse (Oncology) 01/30/2015    CHIEF COMPLAINTS:  Follow-up of metastatic colon cancer  Oncology History   Metastatic colon cancer to liver   Staging form: Colon and Rectum, AJCC 7th Edition     Clinical: Stage Unknown ([place], NX, M1) - Unsigned        Metastatic colon cancer to liver ([place])   07/14/2014 Imaging    CT abdomen and pelvis with contrast showed a 6 cm segment of sigmoid bowel wall thickening with adjacent 14 x 12 mm nodules, diffuse hepatic metastasis. No bowel obstruction.      07/14/2014 Initial Diagnosis    Metastatic colon cancer to liver      07/25/2014 Procedure    Colonoscopy showed a near circumferential medial mass in the sigmoid colon, partially obstructing consistent with carcinoma.      07/25/2014 Initial Biopsy    Sigmoid: Biopsy showed invasive adenocarcinoma arising in a background of high-grade dysplasia.      07/28/2014 Imaging    CT chest showed no evidence of metastasis      08/09/2014 -  Chemotherapy    mFOLFOX6 every 2 weeks, dose reduction from cycle 7 due to cytopenia, Avastin added from cycle 3      08/11/2014 - 08/15/2014 Hospital Admission    Pt was admitted for fever and nausea, treated for UTI, liver biopsy cancelled due to the infection       10/29/2015 Imaging    CT chest, abdomen and pelvis with contrast 10/29/2015 IMPRESSION: 1. New/enlarging pulmonary nodules, most notably the 8 by 6 mm left lower lobe pulmonary nodule, favoring metastatic disease. 2. The liver lesions are subjectively grossly similar in size, difficult to measure due to the lack of IV contrast causing poor boundaries. 3. There is some faint heterogeneity of bony density in the sternum, but this is not significantly  changed from 07/28/2014 and is probably incidental given the lack of other evidence of osseous metastatic disease. 4. Lower lumbar spondylosis and degenerative disc disease causing impingement. 5. Coronary, aortic arch, and branch vessel atherosclerotic vascular disease. Aortoiliac atherosclerotic vascular disease. 6. Mildly dilated esophagus with air-fluid level, probably reflecting dysmotility. 7. Cholelithiasis. 8. Thick wall urinary bladder, cystitis not excluded.       HISTORY OF PRESENTING ILLNESS:  Michelle Erickson 61 y.o. female is here because of abnoaml CT findings which is highly suspecious for metastatic colon cancer.   She presented intermittent nausea, anorexia and abdominal pain since Nov 2015, she has mild pain in the LUQ, crapy pain, positional (bending over),   it happened once every 2-3 weeks, and it has been more frequent in the past month. She has normal BM, but did notice the caliber of the stool is smaller than before. She denies any melena or hematochezia. She lost about 13 lbs in the past 6 months.   She presents to local emergency room in November 2015, and May 2016, was felt to be related to gastric virus. Due to the worsening symptoms lately, she went to [facility] ED on 07/13/2014, CT abdomen was obtained which showed multiple large liver metastasis and probable sigmoid colon mass. She was referred to [place] for further workup. She is scheduled to see GI Dr. Olevia Perches this afternoon.  CURRENT THERAPY: mFOLFOX6 and Avastin every 2 weeks, started on  08/09/2014   INTERIM HISTORY Michelle Erickson returns for follow-up and chemo.she is doing well overall, denies any pain or other symptoms. She has been tolerating Chemotherapy well, except mild fatigue and slightly low appetite after chemotherapy. No other complaints. Her weight is stable.  MEDICAL HISTORY Past Medical History:  Diagnosis Date  . Back pain   . Diabetes mellitus without complication ([place])   . Family history  of colon cancer   . Hyperlipemia   . Hypertension   . Metastatic colon cancer to liver ([place]) 07/21/2014  . Snoring   . Wears glasses    contacts/glasses    SURGICAL HISTORY: Past Surgical History:  Procedure Laterality Date  . CATARACT EXTRACTION Bilateral   . MOUTH SURGERY    . PORTACATH PLACEMENT N/A 08/03/2014   Procedure: INSERTION PORT-A-CATH;  Surgeon: Stark Klein, MD;  Location: [facility];  Service: General;  Laterality: N/A;    SOCIAL HISTORY: Social History   Social History  . Marital status: Married    Spouse name: Charlotte Crumb  . Number of children: 2  . Years of education: N/A   Occupational History  . Not on file.   Social History Main Topics  . Smoking status: Former Smoker    Packs/day: 1.00    Years: 6.00    Types: Cigarettes    Quit date: 01/21/1976  . Smokeless tobacco: Never Used  . Alcohol use No  . Drug use: No  . Sexual activity: Yes    Birth control/ protection: Post-menopausal     Comment: # 2 pregnancies and #2 live births   Other Topics Concern  . Not on file   Social History Narrative   Married, husband [name]   Has #2 adult children   Previously worked at [employer] HISTORY: Family History  Problem Relation Age of Onset  . COPD Father   . Heart attack Father   . Arthritis Sister   . Diabetes Sister   . Colon cancer Sister     dx over 49  . Colon polyps Sister   . Seizures Brother   . Colon cancer Sister     dx in her early 63s, died in her early to mid 80s  . Diabetes Mother     ALLERGIES:  has No Known Allergies.  MEDICATIONS:  Current Outpatient Prescriptions  Medication Sig Dispense Refill  . amLODipine (NORVASC) 5 MG tablet Take 2 tablets (10 mg total) by mouth daily. 30 tablet 0  . atorvastatin (LIPITOR) 40 MG tablet Take by mouth.    . cetirizine (ZYRTEC) 10 MG tablet Take 10 mg by mouth daily as needed for allergies. Reported on 05/02/2015    . ferrous sulfate 325 (65 FE) MG tablet Take 325 mg by mouth  daily with breakfast.    . Insulin Glargine (LANTUS SOLOSTAR) 100 UNIT/ML Solostar Pen Inject 23 Units into the skin daily at 10 pm.    . insulin lispro (HUMALOG) 100 UNIT/ML injection Inject 4-8 Units into the skin 2 (two) times daily as needed for high blood sugar. Sliding scale    . lidocaine-prilocaine (EMLA) cream     . ondansetron (ZOFRAN-ODT) 8 MG disintegrating tablet Take 8 mg by mouth every 8 (eight) hours as needed for nausea or vomiting. Reported on 05/02/2015     No current facility-administered medications for this visit.     REVIEW OF SYSTEMS:   Constitutional: Denies fevers, chills or abnormal night sweats. (+) Mild fatigue Eyes: Denies blurriness of  vision, double vision or watery eyes Ears, nose, mouth, throat, and face: Denies mucositis or sore throat Respiratory: Denies cough, dyspnea or wheezes Cardiovascular: Denies palpitation, chest discomfort or lower extremity swelling Gastrointestinal:  Denies nausea, heartburn or change in bowel habits Skin: Denies abnormal skin rashes Lymphatics: Denies new lymphadenopathy or easy bruising Neurological:Denies numbness, tingling or new weaknesses Behavioral/Psych: Mood is stable, no new changes  All other systems were reviewed with the patient and are negative.  PHYSICAL EXAMINATION: ECOG PERFORMANCE STATUS: 0  BP 134/78 (BP Location: Left Arm, Patient Position: Sitting)   Pulse 87   Temp 98.3 F (36.8 C) (Oral)   Resp 18   Ht '5\' 4"'$  (1.626 m)   Wt 158 lb 11.2 oz (72 kg)   SpO2 98%   BMI 27.24 kg/m  GENERAL:alert, no distress and comfortable SKIN: skin color, texture, turgor are normal, no rashes or significant lesions EYES: normal, conjunctiva are pink and non-injected, sclera clear OROPHARYNX:no exudate, no erythema and lips, buccal mucosa, and tongue normal  NECK: supple, thyroid normal size, non-tender, without nodularity LYMPH:  no palpable lymphadenopathy in the cervical, axillary or inguinal LUNGS: clear to  auscultation and percussion with normal breathing effort HEART: regular rate & rhythm and no murmurs and no lower extremity edema ABDOMEN:abdomen soft, non-tender and normal bowel sounds Musculoskeletal:no cyanosis of digits and no clubbing  PSYCH: alert & oriented x 3 with fluent speech NEURO: no focal motor/sensory deficits  LABORATORY DATA:  I have reviewed the data as listed CBC Latest Ref Rng & Units 01/28/2016 01/16/2016 01/02/2016  WBC 3.9 - 10.3 10e3/uL 4.8 4.7 4.1  Hemoglobin 11.6 - 15.9 g/dL 8.4(L) 8.4(L) 8.3(L)  Hematocrit 34.8 - 46.6 % 25.7(L) 26.5(L) 26.3(L)  Platelets 145 - 400 10e3/uL 118(L) 115(L) 99(L)   CMP Latest Ref Rng & Units 01/28/2016 01/16/2016 01/02/2016  Glucose 70 - 140 mg/dl 66(L) 115 155(H)  BUN 7.0 - 26.0 mg/dL 23.0 25.5 23.2  Creatinine 0.6 - 1.1 mg/dL 1.6(H) 1.6(H) 1.5(H)  Sodium 136 - 145 mEq/L 140 138 140  Potassium 3.5 - 5.1 mEq/L 4.7 5.2(H) 4.8  Chloride 101 - 111 mmol/L - - -  CO2 22 - 29 mEq/L '24 24 23  '$ Calcium 8.4 - 10.4 mg/dL 9.6 9.4 9.4  Total Protein 6.4 - 8.3 g/dL 7.2 7.0 6.9  Total Bilirubin 0.20 - 1.20 mg/dL 0.27 0.29 0.29  Alkaline Phos 40 - 150 U/L 140 121 136  AST 5 - 34 U/L 59(H) 30 45(H)  ALT 0 - 55 U/L 49 25 60(H)   CEA (0-5ng/ml) 07/21/2014: 594.5 11/23/2014: 18.2 03/07/2015: 6.6 09/19/2015: 37.9 10/29/2015: 63.7 11/28/2015: 28.99 01/02/2016: 40.81 01/28/2015: 47  PATHOLOGY REPORTS: Diagnosis 07/25/2014  Surgical [P], sigmoid - INVASIVE ADENOCARCINOMA ARISING IN A BACKGROUND OF HIGH GRADE DYSPLASIA. - SEE COMMENT. Microscopic Comment Results are phoned to Dr. Olevia Perches. Internal departmental review obtained (Dr. Donato Heinz) with agreement.  Diagnosis 09/05/2014 Liver, biopsy - METASTATIC ADENOCARCINOMA, SEE COMMENT. Microscopic Comment Given the patient's history of sigmoid invasive adenocarcinoma and the morphology, the findings are consistent with metastatic colorectal adenocarcinoma. The case was called to Dr. Burr Medico. Per request, MMR,  MSI, and KRAS testing will be ordered.  KRAS MUTATION (+) c36G>A (p. G12D)  RADIOGRAPHIC STUDIES: I have personally reviewed the radiological images as listed and agreed with the findings in the report.  CT chest, abdomen and pelvis wo contrast 01/28/2016 IMPRESSION: 1. Progressive pulmonary metastatic disease. 2. Although not well evaluated on noncontrast imaging, the hepatic disease may be slightly worse  as well. No other evidence of metastatic disease within the chest, abdomen or pelvis. 3. Diffuse atherosclerosis. 4. Cholelithiasis, dilated esophagus and thick-walled urinary bladder again noted  Colonoscopy 07/25/2014 Dr. Olevia Perches  ENDOSCOPIC IMPRESSION: Near circumferential medium mass was found in the sigmoid colon; multiple biopsies were performed using cold forceps, partially obstructing mass consistent with carcinoma. Placement of endoclips and tattoo at the margins of the mass which extends from 18-25 cm from the anus  ASSESSMENT & PLAN: 61 y.o. African-American female, presented with intermittent nausea vomiting and anemia.   1. Sigmoid colon cancer with liver metastasis, TxNxM1, stage IV, MSI stable, KRAS mutation(+) -I previously reviewed her CT scans, colonoscopy, and colon mass biopsy findings with patient and her husband in great details, images were reviewed in person  -I reviewed her liver biopsy results, which confirmed metastasis from colon cancer. -The natural history of metastatic colon cancer and treatment options were discussed with patient and her husband. Giving the diffuse metastasis in the liver, this is unfortunately incurable disease. I recommend systemic chemotherapy to control her metastatic disease, the goal of therapy is palliative and to prolong her life. -she is on first-line chemotherapy FOLFOX and Avastin every 2 weeks, tolerarting well. -her tumor MSI is stable, KRAS mutated, not a candidate for immunotherapy or EGFR inhibitor. -Her CEA has dropped  significantly since she started chemo. I previously reviewed her restaging CT scan from 07/18/2015 which showed stable disease overall, slightly increased of a few liver lesions.  -We'll continue follow-up her CEA monthly, which has slightly increased lately. -She has taken two chemotherapy break lately. -I previously reviewed her restaging CT scan images from 10/2015 with patient and her husband, due to the lack of contrast (renal insufficiency), it is difficult to compare, but her liver disease overall appears to be stable. However she has developed a few lung nodules, with the largest one 8 mm in the left lower lobe, concerning for new metastasis. This could be related to her chemotherapy break, or earlier disease progression  -Given the overall stable disease, I recommend her to restart FOLFOX and Avastin, without chemotherapy break for the next 2-3 months, and repeat staging scan. She agrees. -I reviewed her restaging CT scan from 01/28/2016, which showed slight disease progression in lungs, especailly the LLL lung nodule. Due to lack of CT contrast, it is difficult to evaluate the metastasis in the liver, but it appears to be progression also. -I recommend her to have a staging PET scan, as a new baseline for the future comparison. -I recommend her to switch chemotherapy from FOLFOX to FOLFIRI, and continue Avastin. Potential side effect of urine was taken, especially diarrhea, cytopenia, hair loss, were discussed with patient. She agrees to proceed. -We'll tentatively start her first cycle formulary next week.  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 -Her ferritin is normal, serum iron and saturation slightly low, TIBC low normal, I suggest her to take oral iron supplement, she tolerates well, we'll continue once  daily, with orange juice or vitamin C  -Repeat her iron study on 06/13/2015 showed adequate iron level, we'll repeat again -Her anemia has been slightly getting worse, likely secondary to chemotherapy, hemoglobin 8.4 TODAY, she is asymptomatic, we'll continue monitoring.  3. DM, HTN -Continue follow-up with PCP - we discussed the potential impact of chemotherapy and premedication dexamethasone on  her blood pressure and sugar,will  Monitor closely. Her BG has been well controlled lately  -Her blood pressure has been high lately, we'll repeat in the infusion room. I encouraged her to measure her blood pressure at home. We discussed Avastin can increase her blood pressure, need to be monitored closely. If SBP persistently high than 150, I'll consider adding on a new antihypertensive medication. She will follow-up with her primary care physician also.  4. 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 Will continue monitoring renal function  -I encourage her to drink more fluids  5. Mild thrombocytopenia -Secondary to chemotherapy. -will monitor closely.  6. Goal of care discussion  -We again discussed the incurable nature of her cancer, and the overall poor prognosis, especially if she does not have good response to chemotherapy or progress on chemo -The patient understands the goal of care is palliative. -I recommend DNR/DNI, she will think about it   PLan -Restaging CT scan reviewed, mild disease progression, I recommend her to change chemotherapy to FOLFIRI and continue avastin, starting next week, no chemo today  -Due to her C daily, she is not able to get IV contrast, I'll get a PET scan as a new baseline scan   All questions were answered. The patient knows to call the clinic with any problems, questions or concerns.  I spent 30 minutes counseling the patient face to face. The total time spent in the appointment was 40 minutes and more than 50% was on  counseling.     Truitt Merle, MD 01/30/2016

## 2016-01-31 ENCOUNTER — Encounter: Payer: Self-pay | Admitting: Hematology

## 2016-01-31 ENCOUNTER — Telehealth: Payer: Self-pay | Admitting: *Deleted

## 2016-01-31 DIAGNOSIS — Z7189 Other specified counseling: Secondary | ICD-10-CM | POA: Insufficient documentation

## 2016-01-31 NOTE — Telephone Encounter (Signed)
Per 1/10 LOS I have scehduled appts and notified the scheduler

## 2016-01-31 NOTE — Progress Notes (Signed)
START ON PATHWAY REGIMEN - Colorectal  [id number]: FOLFIRI + Bevacizumab q14 Days   A cycle is every 14 days:     Irinotecan (Camptosar(R)) 180 mg/m2 in 500 mL D5W IV over 90 minutes day 1, q14 days Dose Mod: None     Leucovorin 400 mg/m2 in 250 mL D5W IV over 2 hours day 1, q14 days, followed immediately by Dose Mod: None     5-Fluorouracil 400 mg/m2 IV bolus over 2-4 minutes day 1, q14 days Dose Mod: None     5-Fluorouracil 2,400 mg/m2 in _____mL NS CIV as a 46 hour infusion starting on day 1, q14 days Dose Mod: None     Bevacizumab (Avastin(R)) 5 mg/kg in 160m NS IV every 2 weeks.  Administer first dose over 90 minutes if tolerated second dose over 60 minutes and if tolerated all subsequent doses over 30 minutes Dose Mod: None Additional Orders: **Note: order sheet contains two q14 day cycles** Loperamide 4 mg PO first dose then 2 mg PO with every loose bowel movement up to a total of 16 mg/day (optional).  **Always confirm dose/schedule in your pharmacy ordering system**    Patient Characteristics: Metastatic Colorectal, Second Line, KRAS Mutation Positive/Unknown, BRAF Wild-Type/Unknown, Bevacizumab Eligible AJCC T Stage: X AJCC N Stage: X AJCC Stage Grouping: IVB AJCC M Stage: X Current evidence of distant metastases? Yes BRAF Mutation Status: Wild Type (no mutation) KRAS/NRAS Mutation Status: Mutation Positive Line of therapy: Second Line Would you be surprised if this patient died  in the next year? I would NOT be surprised if this patient died in the next year  Intent of Therapy: Non-Curative / Palliative Intent, Discussed with Patient

## 2016-02-06 ENCOUNTER — Ambulatory Visit: Payer: BLUE CROSS/BLUE SHIELD

## 2016-02-06 ENCOUNTER — Other Ambulatory Visit: Payer: BLUE CROSS/BLUE SHIELD

## 2016-02-14 ENCOUNTER — Encounter (HOSPITAL_COMMUNITY)
Admission: RE | Admit: 2016-02-14 | Discharge: 2016-02-14 | Disposition: A | Discharge status: Home / Self Care | Payer: BLUE CROSS/BLUE SHIELD | Source: Ambulatory Visit | Attending: Hematology | Admitting: Hematology

## 2016-02-14 DIAGNOSIS — C787 Secondary malignant neoplasm of liver and intrahepatic bile duct: Secondary | ICD-10-CM | POA: Diagnosis present

## 2016-02-14 DIAGNOSIS — C189 Malignant neoplasm of colon, unspecified: Secondary | ICD-10-CM | POA: Insufficient documentation

## 2016-02-14 LAB — GLUCOSE, CAPILLARY: Glucose-Capillary: 123 mg/dL — ABNORMAL HIGH (ref 65–99)

## 2016-02-14 MED ORDER — FLUDEOXYGLUCOSE F - 18 (FDG) INJECTION
7.8000 | Freq: Once | INTRAVENOUS | Status: AC | PRN
  Administered 2016-02-14: 7.8 via INTRAVENOUS

## 2016-02-20 ENCOUNTER — Other Ambulatory Visit (HOSPITAL_BASED_OUTPATIENT_CLINIC_OR_DEPARTMENT_OTHER): Payer: BLUE CROSS/BLUE SHIELD

## 2016-02-20 ENCOUNTER — Ambulatory Visit (HOSPITAL_BASED_OUTPATIENT_CLINIC_OR_DEPARTMENT_OTHER): Payer: BLUE CROSS/BLUE SHIELD | Admitting: Hematology

## 2016-02-20 ENCOUNTER — Ambulatory Visit (HOSPITAL_BASED_OUTPATIENT_CLINIC_OR_DEPARTMENT_OTHER): Payer: BLUE CROSS/BLUE SHIELD

## 2016-02-20 ENCOUNTER — Encounter: Payer: Self-pay | Admitting: Hematology

## 2016-02-20 VITALS — BP 131/68 | HR 88 | Temp 98.6°F | Resp 17 | Ht 64.0 in | Wt 157.2 lb

## 2016-02-20 DIAGNOSIS — D630 Anemia in neoplastic disease: Secondary | ICD-10-CM

## 2016-02-20 DIAGNOSIS — C187 Malignant neoplasm of sigmoid colon: Secondary | ICD-10-CM

## 2016-02-20 DIAGNOSIS — E119 Type 2 diabetes mellitus without complications: Secondary | ICD-10-CM

## 2016-02-20 DIAGNOSIS — I10 Essential (primary) hypertension: Secondary | ICD-10-CM

## 2016-02-20 DIAGNOSIS — Z5111 Encounter for antineoplastic chemotherapy: Secondary | ICD-10-CM

## 2016-02-20 DIAGNOSIS — C787 Secondary malignant neoplasm of liver and intrahepatic bile duct: Secondary | ICD-10-CM | POA: Diagnosis not present

## 2016-02-20 DIAGNOSIS — Z452 Encounter for adjustment and management of vascular access device: Secondary | ICD-10-CM | POA: Diagnosis not present

## 2016-02-20 DIAGNOSIS — C189 Malignant neoplasm of colon, unspecified: Secondary | ICD-10-CM

## 2016-02-20 DIAGNOSIS — N189 Chronic kidney disease, unspecified: Secondary | ICD-10-CM

## 2016-02-20 DIAGNOSIS — C7802 Secondary malignant neoplasm of left lung: Secondary | ICD-10-CM | POA: Diagnosis not present

## 2016-02-20 DIAGNOSIS — Z95828 Presence of other vascular implants and grafts: Secondary | ICD-10-CM

## 2016-02-20 LAB — CBC WITH DIFFERENTIAL/PLATELET
BASO%: 0.3 % (ref 0.0–2.0)
BASOS ABS: 0 10*3/uL (ref 0.0–0.1)
EOS ABS: 0.5 10*3/uL (ref 0.0–0.5)
EOS%: 7.2 % — ABNORMAL HIGH (ref 0.0–7.0)
HCT: 28.8 % — ABNORMAL LOW (ref 34.8–46.6)
HGB: 9.2 g/dL — ABNORMAL LOW (ref 11.6–15.9)
LYMPH%: 16.2 % (ref 14.0–49.7)
MCH: 30.4 pg (ref 25.1–34.0)
MCHC: 31.9 g/dL (ref 31.5–36.0)
MCV: 95 fL (ref 79.5–101.0)
MONO#: 0.7 10*3/uL (ref 0.1–0.9)
MONO%: 10.9 % (ref 0.0–14.0)
NEUT#: 4.3 10*3/uL (ref 1.5–6.5)
NEUT%: 65.4 % (ref 38.4–76.8)
Platelets: 147 10*3/uL (ref 145–400)
RBC: 3.03 10*6/uL — AB (ref 3.70–5.45)
RDW: 15.2 % — ABNORMAL HIGH (ref 11.2–14.5)
WBC: 6.5 10*3/uL (ref 3.9–10.3)
lymph#: 1.1 10*3/uL (ref 0.9–3.3)

## 2016-02-20 LAB — COMPREHENSIVE METABOLIC PANEL
ALBUMIN: 3.2 g/dL — AB (ref 3.5–5.0)
ALK PHOS: 178 U/L — AB (ref 40–150)
ALT: 27 U/L (ref 0–55)
ANION GAP: 8 meq/L (ref 3–11)
AST: 36 U/L — ABNORMAL HIGH (ref 5–34)
BILIRUBIN TOTAL: 0.29 mg/dL (ref 0.20–1.20)
BUN: 30.9 mg/dL — ABNORMAL HIGH (ref 7.0–26.0)
CO2: 23 meq/L (ref 22–29)
Calcium: 9.5 mg/dL (ref 8.4–10.4)
Chloride: 105 mEq/L (ref 98–109)
Creatinine: 1.8 mg/dL — ABNORMAL HIGH (ref 0.6–1.1)
EGFR: 34 mL/min/{1.73_m2} — AB (ref >90)
GLUCOSE: 166 mg/dL — AB (ref 70–140)
POTASSIUM: 5 meq/L (ref 3.5–5.1)
SODIUM: 137 meq/L (ref 136–145)
TOTAL PROTEIN: 7.3 g/dL (ref 6.4–8.3)

## 2016-02-20 MED ORDER — DEXAMETHASONE SODIUM PHOSPHATE 10 MG/ML IJ SOLN
10.0000 mg | Freq: Once | INTRAMUSCULAR | Status: AC
  Administered 2016-02-20: 10 mg via INTRAVENOUS

## 2016-02-20 MED ORDER — DEXAMETHASONE SODIUM PHOSPHATE 10 MG/ML IJ SOLN
INTRAMUSCULAR | Status: AC
  Filled 2016-02-20: qty 1

## 2016-02-20 MED ORDER — ATROPINE SULFATE 1 MG/ML IJ SOLN: 0.5000 mg | Freq: Once | INTRAMUSCULAR | Status: DC | PRN

## 2016-02-20 MED ORDER — LEUCOVORIN CALCIUM INJECTION 350 MG
400.0000 mg/m2 | Freq: Once | INTRAMUSCULAR | Status: AC
  Administered 2016-02-20: 720 mg via INTRAVENOUS
  Filled 2016-02-20: qty 36

## 2016-02-20 MED ORDER — SODIUM CHLORIDE 0.9 % IV SOLN
2200.0000 mg/m2 | INTRAVENOUS | Status: DC
  Administered 2016-02-20: 3950 mg via INTRAVENOUS
  Filled 2016-02-20: qty 79

## 2016-02-20 MED ORDER — PALONOSETRON HCL INJECTION 0.25 MG/5ML
INTRAVENOUS | Status: AC
  Filled 2016-02-20: qty 5

## 2016-02-20 MED ORDER — SODIUM CHLORIDE 0.9 % IJ SOLN
10.0000 mL | INTRAMUSCULAR | Status: DC | PRN
  Administered 2016-02-20: 10 mL via INTRAVENOUS
  Filled 2016-02-20: qty 10

## 2016-02-20 MED ORDER — SODIUM CHLORIDE 0.9 % IV SOLN
Freq: Once | INTRAVENOUS | Status: AC
  Administered 2016-02-20: 10:00:00 via INTRAVENOUS

## 2016-02-20 MED ORDER — SODIUM CHLORIDE 0.9 % IV SOLN
180.0000 mg/m2 | Freq: Once | INTRAVENOUS | Status: AC
  Administered 2016-02-20: 320 mg via INTRAVENOUS
  Filled 2016-02-20: qty 6

## 2016-02-20 MED ORDER — SODIUM CHLORIDE 0.9% FLUSH
10.0000 mL | INTRAVENOUS | Status: DC | PRN
  Filled 2016-02-20: qty 10

## 2016-02-20 MED ORDER — PALONOSETRON HCL INJECTION 0.25 MG/5ML
0.2500 mg | Freq: Once | INTRAVENOUS | Status: AC
  Administered 2016-02-20: 0.25 mg via INTRAVENOUS

## 2016-02-20 MED ORDER — SODIUM CHLORIDE 0.9 % IV SOLN: 10.0000 mg | Freq: Once | INTRAVENOUS | Status: DC

## 2016-02-20 NOTE — Patient Instructions (Signed)
[facility] Discharge Instructions for Patients Receiving Chemotherapy  Today you received the following chemotherapy agents Irinotecan, Leucovorin, & 5FU  To help prevent nausea and vomiting after your treatment, we encourage you to take your nausea medication as directed.   If you develop nausea and vomiting that is not controlled by your nausea medication, call the clinic.   BELOW ARE SYMPTOMS THAT SHOULD BE REPORTED IMMEDIATELY:  *FEVER GREATER THAN 100.5 F  *CHILLS WITH OR WITHOUT FEVER  NAUSEA AND VOMITING THAT IS NOT CONTROLLED WITH YOUR NAUSEA MEDICATION  *UNUSUAL SHORTNESS OF BREATH  *UNUSUAL BRUISING OR BLEEDING  TENDERNESS IN MOUTH AND THROAT WITH OR WITHOUT PRESENCE OF ULCERS  *URINARY PROBLEMS  *BOWEL PROBLEMS  UNUSUAL RASH Items with * indicate a potential emergency and should be followed up as soon as possible.  Feel free to call the clinic you have any questions or concerns. The clinic phone number is [phone].  Please show the [facility] at check-in to the Emergency Department and triage nurse.   Irinotecan injection What is this medicine? IRINOTECAN (ir in oh TEE kan ) is a chemotherapy drug. It is used to treat colon and rectal cancer. This medicine may be used for other purposes; ask your health care provider or pharmacist if you have questions. COMMON BRAND NAME(S): Camptosar What should I tell my health care provider before I take this medicine? They need to know if you have any of these conditions: -blood disorders -dehydration -diarrhea -infection (especially a virus infection such as chickenpox, cold sores, or herpes) -liver disease -low blood counts, like low white cell, platelet, or red cell counts -recent or ongoing radiation therapy -an unusual or allergic reaction to irinotecan, sorbitol, other chemotherapy, other medicines, foods, dyes, or preservatives -pregnant or trying to get pregnant -breast-feeding How  should I use this medicine? This drug is given as an infusion into a vein. It is administered in a hospital or clinic by a specially trained health care professional. Talk to your pediatrician regarding the use of this medicine in children. Special care may be needed. Overdosage: If you think you have taken too much of this medicine contact a poison control center or emergency room at once. NOTE: This medicine is only for you. Do not share this medicine with others. What if I miss a dose? It is important not to miss your dose. Call your doctor or health care professional if you are unable to keep an appointment. What may interact with this medicine? Do not take this medicine with any of the following medications: -atazanavir -certain medicines for fungal infections like itraconazole and ketoconazole -St. John's Wort This medicine may also interact with the following medications: -dexamethasone -diuretics -laxatives -medicines for seizures like carbamazepine, mephobarbital, phenobarbital, phenytoin, primidone -medicines to increase blood counts like filgrastim, pegfilgrastim, sargramostim -prochlorperazine -vaccines This list may not describe all possible interactions. Give your health care provider a list of all the medicines, herbs, non-prescription drugs, or dietary supplements you use. Also tell them if you smoke, drink alcohol, or use illegal drugs. Some items may interact with your medicine. What should I watch for while using this medicine? Your condition will be monitored carefully while you are receiving this medicine. You will need important blood work done while you are taking this medicine. This drug may make you feel generally unwell. This is not uncommon, as chemotherapy can affect healthy cells as well as cancer cells. Report any side effects. Continue your course of treatment even  though you feel ill unless your doctor tells you to stop. In some cases, you may be given  additional medicines to help with side effects. Follow all directions for their use. You may get drowsy or dizzy. Do not drive, use machinery, or do anything that needs mental alertness until you know how this medicine affects you. Do not stand or sit up quickly, especially if you are an older patient. This reduces the risk of dizzy or fainting spells. Call your doctor or health care professional for advice if you get a fever, chills or sore throat, or other symptoms of a cold or flu. Do not treat yourself. This drug decreases your body's ability to fight infections. Try to avoid being around people who are sick. This medicine may increase your risk to bruise or bleed. Call your doctor or health care professional if you notice any unusual bleeding. Be careful brushing and flossing your teeth or using a toothpick because you may get an infection or bleed more easily. If you have any dental work done, tell your dentist you are receiving this medicine. Avoid taking products that contain aspirin, acetaminophen, ibuprofen, naproxen, or ketoprofen unless instructed by your doctor. These medicines may hide a fever. Do not become pregnant while taking this medicine. Women should inform their doctor if they wish to become pregnant or think they might be pregnant. There is a potential for serious side effects to an unborn child. Talk to your health care professional or pharmacist for more information. Do not breast-feed an infant while taking this medicine. What side effects may I notice from receiving this medicine? Side effects that you should report to your doctor or health care professional as soon as possible: -allergic reactions like skin rash, itching or hives, swelling of the face, lips, or tongue -low blood counts - this medicine may decrease the number of white blood cells, red blood cells and platelets. You may be at increased risk for infections and bleeding. -signs of infection - fever or chills,  cough, sore throat, pain or difficulty passing urine -signs of decreased platelets or bleeding - bruising, pinpoint red spots on the skin, black, tarry stools, blood in the urine -signs of decreased red blood cells - unusually weak or tired, fainting spells, lightheadedness -breathing problems -chest pain -diarrhea -feeling faint or lightheaded, falls -flushing, runny nose, sweating during infusion -mouth sores or pain -pain, swelling, redness or irritation where injected -pain, swelling, warmth in the leg -pain, tingling, numbness in the hands or feet -problems with balance, talking, walking -stomach cramps, pain -trouble passing urine or change in the amount of urine -vomiting as to be unable to hold down drinks or food -yellowing of the eyes or skin Side effects that usually do not require medical attention (report to your doctor or health care professional if they continue or are bothersome): -constipation -hair loss -headache -loss of appetite -nausea, vomiting -stomach upset This list may not describe all possible side effects. Call your doctor for medical advice about side effects. You may report side effects to FDA at 1-800-FDA-1088. Where should I keep my medicine? This drug is given in a hospital or clinic and will not be stored at home. NOTE: This sheet is a summary. It may not cover all possible information. If you have questions about this medicine, talk to your doctor, pharmacist, or health care provider.  2017 [employer] Standard (2012-07-05 16:29:32)   Leucovorin injection What is this medicine? LEUCOVORIN (loo koe VOR in) is used to prevent  or treat the harmful effects of some medicines. This medicine is used to treat anemia caused by a low amount of folic acid in the body. It is also used with 5-fluorouracil (5-FU) to treat colon cancer. This medicine may be used for other purposes; ask your health care provider or pharmacist if you have questions. What should  I tell my health care provider before I take this medicine? They need to know if you have any of these conditions: -anemia from low levels of vitamin B-12 in the blood -an unusual or allergic reaction to leucovorin, folic acid, other medicines, foods, dyes, or preservatives -pregnant or trying to get pregnant -breast-feeding How should I use this medicine? This medicine is for injection into a muscle or into a vein. It is given by a health care professional in a hospital or clinic setting. Talk to your pediatrician regarding the use of this medicine in children. Special care may be needed. Overdosage: If you think you have taken too much of this medicine contact a poison control center or emergency room at once. NOTE: This medicine is only for you. Do not share this medicine with others. What if I miss a dose? This does not apply. What may interact with this medicine? -capecitabine -fluorouracil -phenobarbital -phenytoin -primidone -trimethoprim-sulfamethoxazole This list may not describe all possible interactions. Give your health care provider a list of all the medicines, herbs, non-prescription drugs, or dietary supplements you use. Also tell them if you smoke, drink alcohol, or use illegal drugs. Some items may interact with your medicine. What should I watch for while using this medicine? Your condition will be monitored carefully while you are receiving this medicine. This medicine may increase the side effects of 5-fluorouracil, 5-FU. Tell your doctor or health care professional if you have diarrhea or mouth sores that do not get better or that get worse. What side effects may I notice from receiving this medicine? Side effects that you should report to your doctor or health care professional as soon as possible: -allergic reactions like skin rash, itching or hives, swelling of the face, lips, or tongue -breathing problems -fever, infection -mouth sores -unusual bleeding or  bruising -unusually weak or tired Side effects that usually do not require medical attention (report to your doctor or health care professional if they continue or are bothersome): -constipation or diarrhea -loss of appetite -nausea, vomiting This list may not describe all possible side effects. Call your doctor for medical advice about side effects. You may report side effects to FDA at 1-800-FDA-1088. Where should I keep my medicine? This drug is given in a hospital or clinic and will not be stored at home. NOTE: This sheet is a summary. It may not cover all possible information. If you have questions about this medicine, talk to your doctor, pharmacist, or health care provider.  2017 [employer]/Gold Standard (2007-07-13 16:50:29)

## 2016-02-20 NOTE — Progress Notes (Signed)
02/20/2016 Per Dr. Burr Medico ok to treat with Crt 1.8

## 2016-02-20 NOTE — Progress Notes (Signed)
[facility]  [phone] [phone]  Clinic Follow Up Note   Patient Care Team: Dione Housekeeper, MD as PCP - General (Family Medicine) Tania Ade, RN as Registered Nurse (Oncology) 02/20/2016   CHIEF COMPLAINTS:  Follow-up of metastatic colon cancer  Oncology History   Metastatic colon cancer to liver   Staging form: Colon and Rectum, AJCC 7th Edition     Clinical: Stage Unknown ([place], NX, M1) - Unsigned        Metastatic colon cancer to liver ([place])   07/14/2014 Imaging    CT abdomen and pelvis with contrast showed a 6 cm segment of sigmoid bowel wall thickening with adjacent 14 x 12 mm nodules, diffuse hepatic metastasis. No bowel obstruction.      07/14/2014 Initial Diagnosis    Metastatic colon cancer to liver      07/25/2014 Procedure    Colonoscopy showed a near circumferential medial mass in the sigmoid colon, partially obstructing consistent with carcinoma.      07/25/2014 Initial Biopsy    Sigmoid: Biopsy showed invasive adenocarcinoma arising in a background of high-grade dysplasia.      07/28/2014 Imaging    CT chest showed no evidence of metastasis      08/09/2014 - 01/16/2016 Chemotherapy    mFOLFOX6 every 2 weeks, dose reduction from cycle 7 due to cytopenia, Avastin added from cycle 3, stopped due to disease progression       08/11/2014 - 08/15/2014 Hospital Admission    Pt was admitted for fever and nausea, treated for UTI, liver biopsy cancelled due to the infection       10/29/2015 Imaging    CT chest, abdomen and pelvis with contrast 10/29/2015 IMPRESSION: 1. New/enlarging pulmonary nodules, most notably the 8 by 6 mm left lower lobe pulmonary nodule, favoring metastatic disease. 2. The liver lesions are subjectively grossly similar in size, difficult to measure due to the lack of IV contrast causing poor boundaries. 3. There is some faint heterogeneity of bony density in  the sternum, but this is not significantly changed from 07/28/2014 and is probably incidental given the lack of other evidence of osseous metastatic disease. 4. Lower lumbar spondylosis and degenerative disc disease causing impingement. 5. Coronary, aortic arch, and branch vessel atherosclerotic vascular disease. Aortoiliac atherosclerotic vascular disease. 6. Mildly dilated esophagus with air-fluid level, probably reflecting dysmotility. 7. Cholelithiasis. 8. Thick wall urinary bladder, cystitis not excluded.      01/28/2016 Progression    Restaging CT showed disease progression in lungs, and probably progression in liver mets also      02/20/2016 -  Chemotherapy    Second line chemo FOLFIRI and Avastin, every 2 weeks        HISTORY OF PRESENTING ILLNESS:  Michelle Erickson 61 y.o. female is here because of abnoaml CT findings which is highly suspecious for metastatic colon cancer.   She presented intermittent nausea, anorexia and abdominal pain since Nov 2015, she has mild pain in the LUQ, crapy pain, positional (bending over),   it happened once every 2-3 weeks, and it has been more frequent in the past month. She has normal BM, but did notice the caliber of the stool is smaller than before. She denies any melena or hematochezia. She lost about 13 lbs in the past 6 months.   She presents to local emergency room in November 2015, and May 2016, was felt to be related to gastric virus. Due to the worsening symptoms lately, she went  to [facility] ED on 07/13/2014, CT abdomen was obtained which showed multiple large liver metastasis and probable sigmoid colon mass. She was referred to [place] for further workup. She is scheduled to see GI Dr. Olevia Perches this afternoon.  CURRENT THERAPY: second line FOLFIRI and Avastin every 2 weeks, starting 02/20/2016  INTERIM HISTORY Michelle Erickson returns for follow-up and chemo. Her last chemotherapy was 4-5 weeks ago, she is doing very well, has good appetite  and energy level. She denies any significant pain, cough, abdominal discomfort or change of her bowel habits. Her weight is stable. She has a loose tooth, and plan to have extraction in the near future.  MEDICAL HISTORY Past Medical History:  Diagnosis Date  . Back pain   . Diabetes mellitus without complication ([place])   . Family history of colon cancer   . Hyperlipemia   . Hypertension   . Metastatic colon cancer to liver ([place]) 07/21/2014  . Snoring   . Wears glasses    contacts/glasses    SURGICAL HISTORY: Past Surgical History:  Procedure Laterality Date  . CATARACT EXTRACTION Bilateral   . MOUTH SURGERY    . PORTACATH PLACEMENT N/A 08/03/2014   Procedure: INSERTION PORT-A-CATH;  Surgeon: Stark Klein, MD;  Location: [facility];  Service: General;  Laterality: N/A;    SOCIAL HISTORY: Social History   Social History  . Marital status: Married    Spouse name: Charlotte Crumb  . Number of children: 2  . Years of education: N/A   Occupational History  . Not on file.   Social History Main Topics  . Smoking status: Former Smoker    Packs/day: 1.00    Years: 6.00    Types: Cigarettes    Quit date: 01/21/1976  . Smokeless tobacco: Never Used  . Alcohol use No  . Drug use: No  . Sexual activity: Yes    Birth control/ protection: Post-menopausal     Comment: # 2 pregnancies and #2 live births   Other Topics Concern  . Not on file   Social History Narrative   Married, husband [name]   Has #2 adult children   Previously worked at [employer] HISTORY: Family History  Problem Relation Age of Onset  . COPD Father   . Heart attack Father   . Arthritis Sister   . Diabetes Sister   . Colon cancer Sister     dx over 65  . Colon polyps Sister   . Seizures Brother   . Colon cancer Sister     dx in her early 43s, died in her early to mid 32s  . Diabetes Mother     ALLERGIES:  has No Known Allergies.  MEDICATIONS:  Current Outpatient Prescriptions  Medication  Sig Dispense Refill  . amLODipine (NORVASC) 5 MG tablet Take 2 tablets (10 mg total) by mouth daily. 30 tablet 0  . atorvastatin (LIPITOR) 40 MG tablet Take by mouth.    . cetirizine (ZYRTEC) 10 MG tablet Take 10 mg by mouth daily as needed for allergies. Reported on 05/02/2015    . ferrous sulfate 325 (65 FE) MG tablet Take 325 mg by mouth daily with breakfast.    . Insulin Glargine (LANTUS SOLOSTAR) 100 UNIT/ML Solostar Pen Inject 20 Units into the skin daily at 10 pm.     . insulin lispro (HUMALOG) 100 UNIT/ML injection Inject 4-8 Units into the skin 2 (two) times daily as needed for high blood sugar. Sliding scale    .  lidocaine-prilocaine (EMLA) cream     . ondansetron (ZOFRAN-ODT) 8 MG disintegrating tablet Take 8 mg by mouth every 8 (eight) hours as needed for nausea or vomiting. Reported on 05/02/2015     No current facility-administered medications for this visit.     REVIEW OF SYSTEMS:   Constitutional: Denies fevers, chills or abnormal night sweats. (+) Mild fatigue Eyes: Denies blurriness of vision, double vision or watery eyes Ears, nose, mouth, throat, and face: Denies mucositis or sore throat Respiratory: Denies cough, dyspnea or wheezes Cardiovascular: Denies palpitation, chest discomfort or lower extremity swelling Gastrointestinal:  Denies nausea, heartburn or change in bowel habits Skin: Denies abnormal skin rashes Lymphatics: Denies new lymphadenopathy or easy bruising Neurological:Denies numbness, tingling or new weaknesses Behavioral/Psych: Mood is stable, no new changes  All other systems were reviewed with the patient and are negative.  PHYSICAL EXAMINATION: ECOG PERFORMANCE STATUS: 0  BP 131/68 (BP Location: Left Arm, Patient Position: Sitting)   Pulse 88   Temp 98.6 F (37 C) (Oral)   Resp 17   Ht '5\' 4"'$  (1.626 m)   Wt 157 lb 3.2 oz (71.3 kg)   SpO2 100%   BMI 26.98 kg/m  GENERAL:alert, no distress and comfortable SKIN: skin color, texture, turgor are  normal, no rashes or significant lesions EYES: normal, conjunctiva are pink and non-injected, sclera clear OROPHARYNX:no exudate, no erythema and lips, buccal mucosa, and tongue normal  NECK: supple, thyroid normal size, non-tender, without nodularity LYMPH:  no palpable lymphadenopathy in the cervical, axillary or inguinal LUNGS: clear to auscultation and percussion with normal breathing effort HEART: regular rate & rhythm and no murmurs and no lower extremity edema ABDOMEN:abdomen soft, non-tender and normal bowel sounds Musculoskeletal:no cyanosis of digits and no clubbing  PSYCH: alert & oriented x 3 with fluent speech NEURO: no focal motor/sensory deficits  LABORATORY DATA:  I have reviewed the data as listed CBC Latest Ref Rng & Units 02/20/2016 01/28/2016 01/16/2016  WBC 3.9 - 10.3 10e3/uL 6.5 4.8 4.7  Hemoglobin 11.6 - 15.9 g/dL 9.2(L) 8.4(L) 8.4(L)  Hematocrit 34.8 - 46.6 % 28.8(L) 25.7(L) 26.5(L)  Platelets 145 - 400 10e3/uL 147 118(L) 115(L)   CMP Latest Ref Rng & Units 02/20/2016 01/28/2016 01/16/2016  Glucose 70 - 140 mg/dl 166(H) 66(L) 115  BUN 7.0 - 26.0 mg/dL 30.9(H) 23.0 25.5  Creatinine 0.6 - 1.1 mg/dL 1.8(H) 1.6(H) 1.6(H)  Sodium 136 - 145 mEq/L 137 140 138  Potassium 3.5 - 5.1 mEq/L 5.0 4.7 5.2(H)  Chloride 101 - 111 mmol/L - - -  CO2 22 - 29 mEq/L '23 24 24  '$ Calcium 8.4 - 10.4 mg/dL 9.5 9.6 9.4  Total Protein 6.4 - 8.3 g/dL 7.3 7.2 7.0  Total Bilirubin 0.20 - 1.20 mg/dL 0.29 0.27 0.29  Alkaline Phos 40 - 150 U/L 178(H) 140 121  AST 5 - 34 U/L 36(H) 59(H) 30  ALT 0 - 55 U/L 27 49 25   CEA (0-5ng/ml) 07/21/2014: 594.5 11/23/2014: 18.2 03/07/2015: 6.6 09/19/2015: 37.9 10/29/2015: 63.7 11/28/2015: 28.99 01/02/2016: 40.81 01/28/2015: 47  PATHOLOGY REPORTS: Diagnosis 07/25/2014  Surgical [P], sigmoid - INVASIVE ADENOCARCINOMA ARISING IN A BACKGROUND OF HIGH GRADE DYSPLASIA. - SEE COMMENT. Microscopic Comment Results are phoned to Dr. Olevia Perches. Internal departmental  review obtained (Dr. Donato Heinz) with agreement.  Diagnosis 09/05/2014 Liver, biopsy - METASTATIC ADENOCARCINOMA, SEE COMMENT. Microscopic Comment Given the patient's history of sigmoid invasive adenocarcinoma and the morphology, the findings are consistent with metastatic colorectal adenocarcinoma. The case was called to Dr. Burr Medico.  Per request, MMR, MSI, and KRAS testing will be ordered.  KRAS MUTATION (+) c36G>A (p. G12D)  RADIOGRAPHIC STUDIES: I have personally reviewed the radiological images as listed and agreed with the findings in the report.  PET 02/14/2016 IMPRESSION: 1. Examination is positive for hypermetabolic tumor within both lobes of liver. Index measurements provided above. 2. There are scattered pulmonary nodules within both lungs, most of these are too small to reliably characterize by PET-CT. In the left lower lobe there is a lung nodule which exhibits malignant range FDG uptake measuring 9 mm. 3. Aortic atherosclerosis and coronary artery calcifications.  Colonoscopy 07/25/2014 Dr. Olevia Perches  ENDOSCOPIC IMPRESSION: Near circumferential medium mass was found in the sigmoid colon; multiple biopsies were performed using cold forceps, partially obstructing mass consistent with carcinoma. Placement of endoclips and tattoo at the margins of the mass which extends from 18-25 cm from the anus  ASSESSMENT & PLAN: 61 y.o. African-American female, presented with intermittent nausea vomiting and anemia.   1. 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 -The natural history of metastatic colon cancer and treatment options were discussed with patient and her husband. Giving the diffuse metastasis  in the liver, this is unfortunately incurable disease. I recommend systemic chemotherapy to control her metastatic disease, the goal of therapy is palliative and to prolong her life. -she is on first-line chemotherapy FOLFOX and Avastin every 2 weeks, tolerarting well. -her tumor MSI is stable, KRAS mutated, not a candidate for immunotherapy or EGFR inhibitor. -Her CEA has dropped significantly since she started chemo. I previously reviewed her restaging CT scan from 07/18/2015 which showed stable disease overall, slightly increased of a few liver lesions.  -We'll continue follow-up her CEA monthly, which has slightly increased lately. -She has taken two chemotherapy break lately. -I previously reviewed her restaging CT scan images from 10/2015 with patient and her husband, due to the lack of contrast (renal insufficiency), it is difficult to compare, but her liver disease overall appears to be stable. However she has developed a few lung nodules, with the largest one 8 mm in the left lower lobe, concerning for new metastasis. This could be related to her chemotherapy break, or earlier disease progression  -Given the overall stable disease, I recommend her to restart FOLFOX and Avastin, without chemotherapy break for the next 2-3 months, and repeat staging scan. She agrees. -I reviewed her restaging CT scan from 01/28/2016, which showed slight disease progression in lungs, especailly the LLL lung nodule. Due to lack of CT contrast, it is difficult to evaluate the metastasis in the liver, but it appears to be progression also. -I discussed her PET scan findings, which showed hypermetabolic multiple liver metastasis and a few lung metastasis.  -She is scheduled to start second line chemotherapy with FOLFIRI and Avastin. Due to her pending tooth extraction, I'll hold Avastin today and possible next cycle, depends on the date of her dental procedure  -Potential side effects from chemotherapy reviewed with her  again, I encouraged her taking Imodium if she has diarrhea after chemotherapy.  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 -Her ferritin is normal, serum iron and saturation slightly low, TIBC low normal, I suggest her to take oral iron supplement, she tolerates well, we'll continue once daily, with orange juice or vitamin C  -Repeat her iron study on 06/13/2015 showed adequate iron level, we'll repeat again -Her anemia has been stable overall, hemoglobin 9.2 TODAY,  improved, since she has been off chemotherapy for months, she is asymptomatic, we'll continue monitoring.  3. DM, HTN -Continue follow-up with PCP - we discussed the potential impact of chemotherapy and premedication dexamethasone on her blood pressure and sugar,will  Monitor closely. Her BG has been well controlled lately  -Her blood pressure has been high lately, we'll repeat in the infusion room. I encouraged her to measure her blood pressure at home. We discussed Avastin can increase her blood pressure, need to be monitored closely. If SBP persistently high than 150, I'll consider adding on a new antihypertensive medication. She will follow-up with her primary care physician also.  4. 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 Will continue monitoring renal function  -I encourage her to drink more fluids -We'll avoid NSAIDs and IV contrast for CT scan in the future  5. Goal of care discussion  -We previously discussed the incurable nature of her cancer, and the overall poor prognosis, especially if she does not have good response to chemotherapy or progress on chemo -The patient understands the goal of care is palliative. -I recommend DNR/DNI, she will think about it   PLan -Restaging PETscan reviewed -will start  second line chemotherapy FOLFIRI, will add Avastin back after her tooth extraction   All questions were answered. The patient knows to call the clinic with any problems, questions or concerns.  I spent 25 minutes counseling the patient face to face. The total time spent in the appointment was 30 minutes and more than 50% was on counseling.     Truitt Merle, MD 02/20/2016

## 2016-02-20 NOTE — Patient Instructions (Signed)

## 2016-02-22 ENCOUNTER — Ambulatory Visit (HOSPITAL_BASED_OUTPATIENT_CLINIC_OR_DEPARTMENT_OTHER): Payer: BLUE CROSS/BLUE SHIELD

## 2016-02-22 DIAGNOSIS — C787 Secondary malignant neoplasm of liver and intrahepatic bile duct: Principal | ICD-10-CM

## 2016-02-22 DIAGNOSIS — C189 Malignant neoplasm of colon, unspecified: Secondary | ICD-10-CM

## 2016-02-22 DIAGNOSIS — Z452 Encounter for adjustment and management of vascular access device: Secondary | ICD-10-CM

## 2016-02-22 DIAGNOSIS — C187 Malignant neoplasm of sigmoid colon: Secondary | ICD-10-CM | POA: Diagnosis not present

## 2016-02-22 MED ORDER — HEPARIN SOD (PORK) LOCK FLUSH 100 UNIT/ML IV SOLN
500.0000 [IU] | Freq: Once | INTRAVENOUS | Status: AC | PRN
  Administered 2016-02-22: 500 [IU]
  Filled 2016-02-22: qty 5

## 2016-02-22 MED ORDER — SODIUM CHLORIDE 0.9% FLUSH
10.0000 mL | INTRAVENOUS | Status: DC | PRN
  Administered 2016-02-22: 10 mL
  Filled 2016-02-22: qty 10

## 2016-03-05 ENCOUNTER — Ambulatory Visit: Payer: BLUE CROSS/BLUE SHIELD

## 2016-03-05 ENCOUNTER — Ambulatory Visit (HOSPITAL_BASED_OUTPATIENT_CLINIC_OR_DEPARTMENT_OTHER): Payer: BLUE CROSS/BLUE SHIELD

## 2016-03-05 ENCOUNTER — Other Ambulatory Visit (HOSPITAL_BASED_OUTPATIENT_CLINIC_OR_DEPARTMENT_OTHER): Payer: BLUE CROSS/BLUE SHIELD

## 2016-03-05 VITALS — BP 163/67 | HR 83 | Temp 98.5°F | Resp 16

## 2016-03-05 DIAGNOSIS — C787 Secondary malignant neoplasm of liver and intrahepatic bile duct: Secondary | ICD-10-CM

## 2016-03-05 DIAGNOSIS — Z5111 Encounter for antineoplastic chemotherapy: Secondary | ICD-10-CM | POA: Diagnosis not present

## 2016-03-05 DIAGNOSIS — C187 Malignant neoplasm of sigmoid colon: Secondary | ICD-10-CM | POA: Diagnosis not present

## 2016-03-05 DIAGNOSIS — D630 Anemia in neoplastic disease: Secondary | ICD-10-CM

## 2016-03-05 DIAGNOSIS — C189 Malignant neoplasm of colon, unspecified: Secondary | ICD-10-CM

## 2016-03-05 LAB — COMPREHENSIVE METABOLIC PANEL
ALT: 93 U/L — AB (ref 0–55)
AST: 77 U/L — ABNORMAL HIGH (ref 5–34)
Albumin: 3.3 g/dL — ABNORMAL LOW (ref 3.5–5.0)
Alkaline Phosphatase: 179 U/L — ABNORMAL HIGH (ref 40–150)
Anion Gap: 8 mEq/L (ref 3–11)
BILIRUBIN TOTAL: 0.23 mg/dL (ref 0.20–1.20)
BUN: 15.3 mg/dL (ref 7.0–26.0)
CO2: 25 meq/L (ref 22–29)
Calcium: 9.4 mg/dL (ref 8.4–10.4)
Chloride: 106 mEq/L (ref 98–109)
Creatinine: 1.4 mg/dL — ABNORMAL HIGH (ref 0.6–1.1)
EGFR: 48 mL/min/{1.73_m2} — ABNORMAL LOW (ref >90)
GLUCOSE: 59 mg/dL — AB (ref 70–140)
Potassium: 4.8 mEq/L (ref 3.5–5.1)
SODIUM: 139 meq/L (ref 136–145)
TOTAL PROTEIN: 6.9 g/dL (ref 6.4–8.3)

## 2016-03-05 LAB — CBC WITH DIFFERENTIAL/PLATELET
BASO%: 0.3 % (ref 0.0–2.0)
Basophils Absolute: 0 10*3/uL (ref 0.0–0.1)
EOS%: 13.3 % — ABNORMAL HIGH (ref 0.0–7.0)
Eosinophils Absolute: 0.5 10*3/uL (ref 0.0–0.5)
HCT: 26.5 % — ABNORMAL LOW (ref 34.8–46.6)
HEMOGLOBIN: 8.4 g/dL — AB (ref 11.6–15.9)
LYMPH%: 28.8 % (ref 14.0–49.7)
MCH: 29.8 pg (ref 25.1–34.0)
MCHC: 31.7 g/dL (ref 31.5–36.0)
MCV: 94 fL (ref 79.5–101.0)
MONO#: 0.4 10*3/uL (ref 0.1–0.9)
MONO%: 11.6 % (ref 0.0–14.0)
NEUT%: 46 % (ref 38.4–76.8)
NEUTROS ABS: 1.6 10*3/uL (ref 1.5–6.5)
Platelets: 117 10*3/uL — ABNORMAL LOW (ref 145–400)
RBC: 2.82 10*6/uL — AB (ref 3.70–5.45)
RDW: 15.1 % — AB (ref 11.2–14.5)
WBC: 3.5 10*3/uL — AB (ref 3.9–10.3)
lymph#: 1 10*3/uL (ref 0.9–3.3)

## 2016-03-05 LAB — CEA (IN HOUSE-CHCC): CEA (CHCC-In House): 62.66 ng/mL — ABNORMAL HIGH (ref 0.00–5.00)

## 2016-03-05 LAB — IRON AND TIBC
%SAT: 22 % (ref 21–57)
IRON: 64 ug/dL (ref 41–142)
TIBC: 296 ug/dL (ref 236–444)
UIBC: 233 ug/dL (ref 120–384)

## 2016-03-05 LAB — FERRITIN: Ferritin: 313 ng/ml — ABNORMAL HIGH (ref 9–269)

## 2016-03-05 MED ORDER — SODIUM CHLORIDE 0.9 % IV SOLN: 10.0000 mg | Freq: Once | INTRAVENOUS | Status: DC

## 2016-03-05 MED ORDER — DEXTROSE 5 % IV SOLN
400.0000 mg/m2 | Freq: Once | INTRAVENOUS | Status: AC
  Administered 2016-03-05: 720 mg via INTRAVENOUS
  Filled 2016-03-05: qty 36

## 2016-03-05 MED ORDER — DEXAMETHASONE SODIUM PHOSPHATE 10 MG/ML IJ SOLN
INTRAMUSCULAR | Status: AC
  Filled 2016-03-05: qty 1

## 2016-03-05 MED ORDER — SODIUM CHLORIDE 0.9 % IV SOLN
Freq: Once | INTRAVENOUS | Status: AC
  Administered 2016-03-05: 10:00:00 via INTRAVENOUS

## 2016-03-05 MED ORDER — SODIUM CHLORIDE 0.9 % IV SOLN: 180.0000 mg/m2 | Freq: Once | INTRAVENOUS | Status: DC

## 2016-03-05 MED ORDER — SODIUM CHLORIDE 0.9% FLUSH
10.0000 mL | INTRAVENOUS | Status: DC | PRN
  Filled 2016-03-05: qty 10

## 2016-03-05 MED ORDER — DEXAMETHASONE SODIUM PHOSPHATE 10 MG/ML IJ SOLN
10.0000 mg | Freq: Once | INTRAMUSCULAR | Status: AC
  Administered 2016-03-05: 10 mg via INTRAVENOUS

## 2016-03-05 MED ORDER — DEXTROSE 5 % IV SOLN
180.0000 mg/m2 | Freq: Once | INTRAVENOUS | Status: AC
  Administered 2016-03-05: 320 mg via INTRAVENOUS
  Filled 2016-03-05: qty 10

## 2016-03-05 MED ORDER — SODIUM CHLORIDE 0.9 % IV SOLN
2200.0000 mg/m2 | INTRAVENOUS | Status: DC
  Administered 2016-03-05: 3950 mg via INTRAVENOUS
  Filled 2016-03-05: qty 79

## 2016-03-05 MED ORDER — HEPARIN SOD (PORK) LOCK FLUSH 100 UNIT/ML IV SOLN
500.0000 [IU] | Freq: Once | INTRAVENOUS | Status: DC | PRN
  Filled 2016-03-05: qty 5

## 2016-03-05 MED ORDER — PALONOSETRON HCL INJECTION 0.25 MG/5ML
INTRAVENOUS | Status: AC
  Filled 2016-03-05: qty 5

## 2016-03-05 MED ORDER — PALONOSETRON HCL INJECTION 0.25 MG/5ML
0.2500 mg | Freq: Once | INTRAVENOUS | Status: AC
  Administered 2016-03-05: 0.25 mg via INTRAVENOUS

## 2016-03-05 NOTE — Patient Instructions (Signed)

## 2016-03-05 NOTE — Progress Notes (Signed)
03/05/2016 Per Dr. Irene Limbo ok to treat with elevated ALT 93.  Patient states she checked her sugar this morning and it was 144 and per her sliding scale she took 4 units of Novolog and at her blood draw this am she was 59.  Patient accepted hot chocolate to help bring her sugar up some during visit.

## 2016-03-05 NOTE — Patient Instructions (Signed)
[facility] Discharge Instructions for Patients Receiving Chemotherapy  Today you received the following chemotherapy agents Irinotecan, Leucovorin, & 5FU  To help prevent nausea and vomiting after your treatment, we encourage you to take your nausea medication as directed.   If you develop nausea and vomiting that is not controlled by your nausea medication, call the clinic.   BELOW ARE SYMPTOMS THAT SHOULD BE REPORTED IMMEDIATELY:  *FEVER GREATER THAN 100.5 F  *CHILLS WITH OR WITHOUT FEVER  NAUSEA AND VOMITING THAT IS NOT CONTROLLED WITH YOUR NAUSEA MEDICATION  *UNUSUAL SHORTNESS OF BREATH  *UNUSUAL BRUISING OR BLEEDING  TENDERNESS IN MOUTH AND THROAT WITH OR WITHOUT PRESENCE OF ULCERS  *URINARY PROBLEMS  *BOWEL PROBLEMS  UNUSUAL RASH Items with * indicate a potential emergency and should be followed up as soon as possible.  Feel free to call the clinic you have any questions or concerns. The clinic phone number is [phone].  Please show the [facility] at check-in to the Emergency Department and triage nurse.   Irinotecan injection What is this medicine? IRINOTECAN (ir in oh TEE kan ) is a chemotherapy drug. It is used to treat colon and rectal cancer. This medicine may be used for other purposes; ask your health care provider or pharmacist if you have questions. COMMON BRAND NAME(S): Camptosar What should I tell my health care provider before I take this medicine? They need to know if you have any of these conditions: -blood disorders -dehydration -diarrhea -infection (especially a virus infection such as chickenpox, cold sores, or herpes) -liver disease -low blood counts, like low white cell, platelet, or red cell counts -recent or ongoing radiation therapy -an unusual or allergic reaction to irinotecan, sorbitol, other chemotherapy, other medicines, foods, dyes, or preservatives -pregnant or trying to get pregnant -breast-feeding How  should I use this medicine? This drug is given as an infusion into a vein. It is administered in a hospital or clinic by a specially trained health care professional. Talk to your pediatrician regarding the use of this medicine in children. Special care may be needed. Overdosage: If you think you have taken too much of this medicine contact a poison control center or emergency room at once. NOTE: This medicine is only for you. Do not share this medicine with others. What if I miss a dose? It is important not to miss your dose. Call your doctor or health care professional if you are unable to keep an appointment. What may interact with this medicine? Do not take this medicine with any of the following medications: -atazanavir -certain medicines for fungal infections like itraconazole and ketoconazole -St. John's Wort This medicine may also interact with the following medications: -dexamethasone -diuretics -laxatives -medicines for seizures like carbamazepine, mephobarbital, phenobarbital, phenytoin, primidone -medicines to increase blood counts like filgrastim, pegfilgrastim, sargramostim -prochlorperazine -vaccines This list may not describe all possible interactions. Give your health care provider a list of all the medicines, herbs, non-prescription drugs, or dietary supplements you use. Also tell them if you smoke, drink alcohol, or use illegal drugs. Some items may interact with your medicine. What should I watch for while using this medicine? Your condition will be monitored carefully while you are receiving this medicine. You will need important blood work done while you are taking this medicine. This drug may make you feel generally unwell. This is not uncommon, as chemotherapy can affect healthy cells as well as cancer cells. Report any side effects. Continue your course of treatment even  though you feel ill unless your doctor tells you to stop. In some cases, you may be given  additional medicines to help with side effects. Follow all directions for their use. You may get drowsy or dizzy. Do not drive, use machinery, or do anything that needs mental alertness until you know how this medicine affects you. Do not stand or sit up quickly, especially if you are an older patient. This reduces the risk of dizzy or fainting spells. Call your doctor or health care professional for advice if you get a fever, chills or sore throat, or other symptoms of a cold or flu. Do not treat yourself. This drug decreases your body's ability to fight infections. Try to avoid being around people who are sick. This medicine may increase your risk to bruise or bleed. Call your doctor or health care professional if you notice any unusual bleeding. Be careful brushing and flossing your teeth or using a toothpick because you may get an infection or bleed more easily. If you have any dental work done, tell your dentist you are receiving this medicine. Avoid taking products that contain aspirin, acetaminophen, ibuprofen, naproxen, or ketoprofen unless instructed by your doctor. These medicines may hide a fever. Do not become pregnant while taking this medicine. Women should inform their doctor if they wish to become pregnant or think they might be pregnant. There is a potential for serious side effects to an unborn child. Talk to your health care professional or pharmacist for more information. Do not breast-feed an infant while taking this medicine. What side effects may I notice from receiving this medicine? Side effects that you should report to your doctor or health care professional as soon as possible: -allergic reactions like skin rash, itching or hives, swelling of the face, lips, or tongue -low blood counts - this medicine may decrease the number of white blood cells, red blood cells and platelets. You may be at increased risk for infections and bleeding. -signs of infection - fever or chills,  cough, sore throat, pain or difficulty passing urine -signs of decreased platelets or bleeding - bruising, pinpoint red spots on the skin, black, tarry stools, blood in the urine -signs of decreased red blood cells - unusually weak or tired, fainting spells, lightheadedness -breathing problems -chest pain -diarrhea -feeling faint or lightheaded, falls -flushing, runny nose, sweating during infusion -mouth sores or pain -pain, swelling, redness or irritation where injected -pain, swelling, warmth in the leg -pain, tingling, numbness in the hands or feet -problems with balance, talking, walking -stomach cramps, pain -trouble passing urine or change in the amount of urine -vomiting as to be unable to hold down drinks or food -yellowing of the eyes or skin Side effects that usually do not require medical attention (report to your doctor or health care professional if they continue or are bothersome): -constipation -hair loss -headache -loss of appetite -nausea, vomiting -stomach upset This list may not describe all possible side effects. Call your doctor for medical advice about side effects. You may report side effects to FDA at 1-800-FDA-1088. Where should I keep my medicine? This drug is given in a hospital or clinic and will not be stored at home. NOTE: This sheet is a summary. It may not cover all possible information. If you have questions about this medicine, talk to your doctor, pharmacist, or health care provider.  2017 [employer] Standard (2012-07-05 16:29:32)   Leucovorin injection What is this medicine? LEUCOVORIN (loo koe VOR in) is used to prevent  or treat the harmful effects of some medicines. This medicine is used to treat anemia caused by a low amount of folic acid in the body. It is also used with 5-fluorouracil (5-FU) to treat colon cancer. This medicine may be used for other purposes; ask your health care provider or pharmacist if you have questions. What should  I tell my health care provider before I take this medicine? They need to know if you have any of these conditions: -anemia from low levels of vitamin B-12 in the blood -an unusual or allergic reaction to leucovorin, folic acid, other medicines, foods, dyes, or preservatives -pregnant or trying to get pregnant -breast-feeding How should I use this medicine? This medicine is for injection into a muscle or into a vein. It is given by a health care professional in a hospital or clinic setting. Talk to your pediatrician regarding the use of this medicine in children. Special care may be needed. Overdosage: If you think you have taken too much of this medicine contact a poison control center or emergency room at once. NOTE: This medicine is only for you. Do not share this medicine with others. What if I miss a dose? This does not apply. What may interact with this medicine? -capecitabine -fluorouracil -phenobarbital -phenytoin -primidone -trimethoprim-sulfamethoxazole This list may not describe all possible interactions. Give your health care provider a list of all the medicines, herbs, non-prescription drugs, or dietary supplements you use. Also tell them if you smoke, drink alcohol, or use illegal drugs. Some items may interact with your medicine. What should I watch for while using this medicine? Your condition will be monitored carefully while you are receiving this medicine. This medicine may increase the side effects of 5-fluorouracil, 5-FU. Tell your doctor or health care professional if you have diarrhea or mouth sores that do not get better or that get worse. What side effects may I notice from receiving this medicine? Side effects that you should report to your doctor or health care professional as soon as possible: -allergic reactions like skin rash, itching or hives, swelling of the face, lips, or tongue -breathing problems -fever, infection -mouth sores -unusual bleeding or  bruising -unusually weak or tired Side effects that usually do not require medical attention (report to your doctor or health care professional if they continue or are bothersome): -constipation or diarrhea -loss of appetite -nausea, vomiting This list may not describe all possible side effects. Call your doctor for medical advice about side effects. You may report side effects to FDA at 1-800-FDA-1088. Where should I keep my medicine? This drug is given in a hospital or clinic and will not be stored at home. NOTE: This sheet is a summary. It may not cover all possible information. If you have questions about this medicine, talk to your doctor, pharmacist, or health care provider.  2017 [employer] Standard (2007-07-13 16:50:29)

## 2016-03-07 ENCOUNTER — Ambulatory Visit (HOSPITAL_BASED_OUTPATIENT_CLINIC_OR_DEPARTMENT_OTHER): Payer: BLUE CROSS/BLUE SHIELD

## 2016-03-07 VITALS — BP 139/69 | HR 89 | Temp 98.5°F | Resp 17

## 2016-03-07 DIAGNOSIS — Z452 Encounter for adjustment and management of vascular access device: Secondary | ICD-10-CM | POA: Diagnosis not present

## 2016-03-07 DIAGNOSIS — C187 Malignant neoplasm of sigmoid colon: Secondary | ICD-10-CM

## 2016-03-07 DIAGNOSIS — C787 Secondary malignant neoplasm of liver and intrahepatic bile duct: Principal | ICD-10-CM

## 2016-03-07 DIAGNOSIS — C189 Malignant neoplasm of colon, unspecified: Secondary | ICD-10-CM

## 2016-03-07 MED ORDER — HEPARIN SOD (PORK) LOCK FLUSH 100 UNIT/ML IV SOLN
500.0000 [IU] | Freq: Once | INTRAVENOUS | Status: AC | PRN
  Administered 2016-03-07: 500 [IU]
  Filled 2016-03-07: qty 5

## 2016-03-07 MED ORDER — SODIUM CHLORIDE 0.9% FLUSH
10.0000 mL | INTRAVENOUS | Status: DC | PRN
  Administered 2016-03-07: 10 mL
  Filled 2016-03-07: qty 10

## 2016-03-07 NOTE — Patient Instructions (Signed)

## 2016-03-18 NOTE — Progress Notes (Signed)
[facility]  [phone] [phone]) [phone]  Clinic Follow Up Note   Patient Care Team: Dione Housekeeper, MD as PCP - General (Family Medicine) Tania Ade, RN as Registered Nurse (Oncology) 03/19/2016   CHIEF COMPLAINTS:  Follow-up of metastatic colon cancer  Oncology History   Metastatic colon cancer to liver   Staging form: Colon and Rectum, AJCC 7th Edition     Clinical: Stage Unknown ([place], NX, M1) - Unsigned        Metastatic colon cancer to liver ([place])   07/14/2014 Imaging    CT abdomen and pelvis with contrast showed a 6 cm segment of sigmoid bowel wall thickening with adjacent 14 x 12 mm nodules, diffuse hepatic metastasis. No bowel obstruction.      07/14/2014 Initial Diagnosis    Metastatic colon cancer to liver      07/25/2014 Procedure    Colonoscopy showed a near circumferential medial mass in the sigmoid colon, partially obstructing consistent with carcinoma.      07/25/2014 Initial Biopsy    Sigmoid: Biopsy showed invasive adenocarcinoma arising in a background of high-grade dysplasia.      07/28/2014 Imaging    CT chest showed no evidence of metastasis      08/09/2014 - 01/16/2016 Chemotherapy    mFOLFOX6 every 2 weeks, dose reduction from cycle 7 due to cytopenia, Avastin added from cycle 3, stopped due to disease progression       08/11/2014 - 08/15/2014 Hospital Admission    Pt was admitted for fever and nausea, treated for UTI, liver biopsy cancelled due to the infection       10/29/2015 Imaging    CT chest, abdomen and pelvis with contrast 10/29/2015 IMPRESSION: 1. New/enlarging pulmonary nodules, most notably the 8 by 6 mm left lower lobe pulmonary nodule, favoring metastatic disease. 2. The liver lesions are subjectively grossly similar in size, difficult to measure due to the lack of IV contrast causing poor boundaries. 3. There is some faint heterogeneity of bony density in  the sternum, but this is not significantly changed from 07/28/2014 and is probably incidental given the lack of other evidence of osseous metastatic disease. 4. Lower lumbar spondylosis and degenerative disc disease causing impingement. 5. Coronary, aortic arch, and branch vessel atherosclerotic vascular disease. Aortoiliac atherosclerotic vascular disease. 6. Mildly dilated esophagus with air-fluid level, probably reflecting dysmotility. 7. Cholelithiasis. 8. Thick wall urinary bladder, cystitis not excluded.      01/28/2016 Progression    Restaging CT showed disease progression in lungs, and probably progression in liver mets also      02/14/2016 PET scan    IMPRESSION: 1. Examination is positive for hypermetabolic tumor within both lobes of liver. Index measurements provided above. 2. There are scattered pulmonary nodules within both lungs, most of these are too small to reliably characterize by PET-CT. In the left lower lobe there is a lung nodule which exhibits malignant range FDG uptake measuring 9 mm. 3. Aortic atherosclerosis and coronary artery calcifications.      02/20/2016 -  Chemotherapy    Second line chemo FOLFIRI and Avastin, every 2 weeks        HISTORY OF PRESENTING ILLNESS:  Michelle Erickson 61 y.o. female is here because of abnoaml CT findings which is highly suspecious for metastatic colon cancer.   She presented intermittent nausea, anorexia and abdominal pain since Nov 2015, she has mild pain in the LUQ, crapy pain, positional (bending over),   it happened once every  2-3 weeks, and it has been more frequent in the past month. She has normal BM, but did notice the caliber of the stool is smaller than before. She denies any melena or hematochezia. She lost about 13 lbs in the past 6 months.   She presents to local emergency room in November 2015, and May 2016, was felt to be related to gastric virus. Due to the worsening symptoms lately, she went to [facility] ED on 07/13/2014, CT abdomen was obtained which showed multiple large liver metastasis and probable sigmoid colon mass. She was referred to [place] for further workup. She is scheduled to see GI Dr. Olevia Perches this afternoon.  CURRENT THERAPY: second line FOLFIRI and Avastin every 2 weeks, starting 02/20/2016  INTERIM HISTORY Michelle Erickson returns for follow-up and cycle 3 chemo. She is accompanied by her husband. The patient reports some diarrhea 3 hours following her last chemotherapy, which is now controlled with Imodium. She denies nausea or abdominal pain, other than some slight stomach cramping following infusion. The patient had a tooth extracted recently. Overall the pateint reports she is doing well.   MEDICAL HISTORY Past Medical History:  Diagnosis Date  . Back pain   . Diabetes mellitus without complication ([place])   . Family history of colon cancer   . Hyperlipemia   . Hypertension   . Metastatic colon cancer to liver ([place]) 07/21/2014  . Snoring   . Wears glasses    contacts/glasses    SURGICAL HISTORY: Past Surgical History:  Procedure Laterality Date  . CATARACT EXTRACTION Bilateral   . MOUTH SURGERY    . PORTACATH PLACEMENT N/A 08/03/2014   Procedure: INSERTION PORT-A-CATH;  Surgeon: Stark Klein, MD;  Location: [facility];  Service: General;  Laterality: N/A;    SOCIAL HISTORY: Social History   Social History  . Marital status: Married    Spouse name: Charlotte Crumb  . Number of children: 2  . Years of education: N/A   Occupational History  . Not on file.   Social History Main Topics  . Smoking status: Former Smoker    Packs/day: 1.00    Years: 6.00    Types: Cigarettes    Quit date: 01/21/1976  . Smokeless tobacco: Never Used  . Alcohol use No  . Drug use: No  . Sexual activity: Yes    Birth control/ protection: Post-menopausal     Comment: # 2 pregnancies and #2 live births   Other Topics Concern  . Not on file   Social History Narrative   Married, husband [name]    Has #2 adult children   Previously worked at [employer] HISTORY: Family History  Problem Relation Age of Onset  . COPD Father   . Heart attack Father   . Arthritis Sister   . Diabetes Sister   . Colon cancer Sister     dx over 20  . Colon polyps Sister   . Seizures Brother   . Colon cancer Sister     dx in her early 11s, died in her early to mid 31s  . Diabetes Mother     ALLERGIES:  has No Known Allergies.  MEDICATIONS:  Current Outpatient Prescriptions  Medication Sig Dispense Refill  . amLODipine (NORVASC) 5 MG tablet Take 2 tablets (10 mg total) by mouth daily. 30 tablet 0  . atorvastatin (LIPITOR) 40 MG tablet Take by mouth.    . cetirizine (ZYRTEC) 10 MG tablet Take 10 mg by mouth  daily as needed for allergies. Reported on 05/02/2015    . ferrous sulfate 325 (65 FE) MG tablet Take 325 mg by mouth daily with breakfast.    . Insulin Glargine (LANTUS SOLOSTAR) 100 UNIT/ML Solostar Pen Inject 20 Units into the skin daily at 10 pm.     . insulin lispro (HUMALOG) 100 UNIT/ML injection Inject 4-8 Units into the skin 2 (two) times daily as needed for high blood sugar. Sliding scale    . lidocaine-prilocaine (EMLA) cream     . ondansetron (ZOFRAN-ODT) 8 MG disintegrating tablet Take 8 mg by mouth every 8 (eight) hours as needed for nausea or vomiting. Reported on 05/02/2015     No current facility-administered medications for this visit.     REVIEW OF SYSTEMS:   Constitutional: Denies fevers, chills or abnormal night sweats. (+) Mild fatigue Eyes: Denies blurriness of vision, double vision or watery eyes Ears, nose, mouth, throat, and face: Denies mucositis or sore throat Respiratory: Denies cough, dyspnea or wheezes Cardiovascular: Denies palpitation, chest discomfort or lower extremity swelling Gastrointestinal:  Denies nausea, heartburn, (+) diarrhea following chemo, cramping Skin: Denies abnormal skin rashes Lymphatics: Denies new lymphadenopathy or easy  bruising Neurological:Denies numbness, tingling or new weaknesses Behavioral/Psych: Mood is stable, no new changes  All other systems were reviewed with the patient and are negative.  PHYSICAL EXAMINATION: ECOG PERFORMANCE STATUS: 0  BP (!) 143/52 (BP Location: Left Arm, Patient Position: Sitting)   Pulse 91   Temp 98.3 F (36.8 C) (Oral)   Resp 18   Ht _0  (1.626 m)   Wt 154 lb 3.2 oz (69.9 kg)   SpO2 100%   BMI 26.47 kg/m    GENERAL:alert, no distress and comfortable. SKIN: skin color, texture, turgor are normal, no rashes or significant lesions EYES: normal, conjunctiva are pink and non-injected, sclera clear OROPHARYNX:no exudate, no erythema and lips, buccal mucosa, and tongue normal  NECK: supple, thyroid normal size, non-tender, without nodularity LYMPH:  no palpable lymphadenopathy in the cervical, axillary or inguinal LUNGS: clear to auscultation and percussion with normal breathing effort HEART: regular rate & rhythm and no murmurs and no lower extremity edema ABDOMEN:abdomen soft, non-tender and normal bowel sounds Musculoskeletal:no cyanosis of digits and no clubbing  PSYCH: alert & oriented x 3 with fluent speech NEURO: no focal motor/sensory deficits  LABORATORY DATA:  I have reviewed the data as listed CBC Latest Ref Rng & Units 03/19/2016 03/05/2016 02/20/2016  WBC 3.9 - 10.3 10e3/uL 3.3(L) 3.5(L) 6.5  Hemoglobin 11.6 - 15.9 g/dL 8.4(L) 8.4(L) 9.2(L)  Hematocrit 34.8 - 46.6 % 26.3(L) 26.5(L) 28.8(L)  Platelets 145 - 400 10e3/uL 172 117(L) 147   CMP Latest Ref Rng & Units 03/19/2016 03/05/2016 02/20/2016  Glucose 70 - 140 mg/dl 142(H) 59(L) 166(H)  BUN 7.0 - 26.0 mg/dL 25.7 15.3 30.9(H)  Creatinine 0.6 - 1.1 mg/dL 1.6(H) 1.4(H) 1.8(H)  Sodium 136 - 145 mEq/L 142 139 137  Potassium 3.5 - 5.1 mEq/L 4.7 4.8 5.0  Chloride 101 - 111 mmol/L - - -  CO2 22 - 29 mEq/L _1 Calcium 8.4 - 10.4 mg/dL 9.6 9.4 9.5  Total Protein 6.4 - 8.3 g/dL 7.0 6.9 7.3  Total  Bilirubin 0.20 - 1.20 mg/dL 0.22 0.23 0.29  Alkaline Phos 40 - 150 U/L 148 179(H) 178(H)  AST 5 - 34 U/L 30 77(H) 36(H)  ALT 0 - 55 U/L 26 93(H) 27   CEA (0-5ng/ml) 07/21/2014: 594.5 11/23/2014: 18.2 03/07/2015: 6.6 09/19/2015: 37.9  10/29/2015: 63.7 11/28/2015: 28.99 01/02/2016: 40.81 01/28/2015: 47 03/05/16: 62  PATHOLOGY REPORTS: Diagnosis 07/25/2014  Surgical [P], sigmoid - INVASIVE ADENOCARCINOMA ARISING IN A BACKGROUND OF HIGH GRADE DYSPLASIA. - SEE COMMENT.  Diagnosis 09/05/2014 Liver, biopsy - METASTATIC ADENOCARCINOMA, SEE COMMENT.   RADIOGRAPHIC STUDIES: I have personally reviewed the radiological images as listed and agreed with the findings in the report.  PET 02/14/2016 IMPRESSION: 1. Examination is positive for hypermetabolic tumor within both lobes of liver. Index measurements provided above. 2. There are scattered pulmonary nodules within both lungs, most of these are too small to reliably characterize by PET-CT. In the left lower lobe there is a lung nodule which exhibits malignant range FDG uptake measuring 9 mm. 3. Aortic atherosclerosis and coronary artery calcifications.  Colonoscopy 07/25/2014 Dr. Olevia Perches  ENDOSCOPIC IMPRESSION: Near circumferential medium mass was found in the sigmoid colon; multiple biopsies were performed using cold forceps, partially obstructing mass consistent with carcinoma. Placement of endoclips and tattoo at the margins of the mass which extends from 18-25 cm from the anus  ASSESSMENT & PLAN: 61 y.o. African-American female, presented with intermittent nausea vomiting and anemia.   1. 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 -The  natural history of metastatic colon cancer and treatment options were discussed with patient and her husband. Giving the diffuse metastasis in the liver, this is unfortunately incurable disease. I recommend systemic chemotherapy to control her metastatic disease, the goal of therapy is palliative and to prolong her life.  -I previously reviewed her restaging CT scan from 01/28/2016, which showed slight disease progression in lungs, especailly the LLL lung nodule. Due to lack of CT contrast, it is difficult to evaluate the metastasis in the liver, but it appears to be progression also. -I previously discussed her PET scan findings, which showed hypermetabolic multiple liver metastasis and a few lung metastasis.  -she is on second line chemo FOLFIRI and Avastin, tolerating well overall  -lab reviewed, adequate for treatment, we'll proceed cycle 3 chemotherapy today, her proteinuria has decreased, we'll restart Avastin. 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 -Her ferritin is normal, serum iron and saturation slightly low, TIBC low normal, I suggest her to take oral iron supplement, she tolerates well, we'll continue once daily, with orange juice or vitamin C. -Iron today in 64. -Her anemia has been stable overall, hemoglobin 8.4 TODAY,  Stable overall, she is asymptomatic, we'll continue observation.  3. DM, HTN -Continue follow-up with PCP - we previously discussed the potential impact of chemotherapy and premedication dexamethasone on her blood pressure and sugar,will  Monitor closely. Her BG has been well controlled lately  -Her blood pressure has been high lately, we'll repeat in the infusion room. I  encouraged her to measure her blood pressure at home. We discussed Avastin can increase her blood pressure, need to be monitored closely. If SBP persistently high than 150, I'll consider adding on a new antihypertensive medication. She will  follow-up with her primary care physician also.  4. 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 Will continue monitoring renal function  -I encourage her to drink more fluids -We'll avoid NSAIDs and IV contrast for CT scan in the future. -Avastin was held previously, however proteinuria is improved and we will proceed with Avastin today.  5. Goal of care discussion  -We previously discussed the incurable nature of her cancer, and the overall poor prognosis, especially if she does not have good response to chemotherapy or progress on chemo -The patient understands the goal of care is palliative. -I recommended DNR/DNI, she will think about it.  6. Diarrhea secondary to chemotherapy -I advised the patient to continue using Imodium as needed following chemotherapy. She knows she may take 2 Imodium tablets initially if needed. -I encouraged the patient to maintain hydration.  Plan -Proceed with cycle 3 today. Restart Avastin which was previously held due to proteinuria.  -No refills needed at this time. -I will see the patient back in 4 weeks; I will order staging scans at that time.  All questions were answered. The patient knows to call the clinic with any problems, questions or concerns.  I spent 25 minutes counseling the patient face to face. The total time spent in the appointment was 30 minutes and more than 50% was on counseling.  This document serves as a record of services personally performed by Truitt Merle, MD. It was created on her behalf by Maryla Morrow, a trained medical scribe. The creation of this record is based on the scribe's personal observations and the provider's statements to them. This document has been  checked and approved by the attending provider.    Truitt Merle, MD 03/19/2016

## 2016-03-19 ENCOUNTER — Ambulatory Visit (HOSPITAL_BASED_OUTPATIENT_CLINIC_OR_DEPARTMENT_OTHER): Payer: BLUE CROSS/BLUE SHIELD

## 2016-03-19 ENCOUNTER — Encounter: Payer: Self-pay | Admitting: Hematology

## 2016-03-19 ENCOUNTER — Other Ambulatory Visit (HOSPITAL_BASED_OUTPATIENT_CLINIC_OR_DEPARTMENT_OTHER): Payer: BLUE CROSS/BLUE SHIELD

## 2016-03-19 ENCOUNTER — Telehealth: Payer: Self-pay | Admitting: Hematology

## 2016-03-19 ENCOUNTER — Ambulatory Visit (HOSPITAL_BASED_OUTPATIENT_CLINIC_OR_DEPARTMENT_OTHER): Payer: BLUE CROSS/BLUE SHIELD | Admitting: Hematology

## 2016-03-19 VITALS — BP 143/52 | HR 91 | Temp 98.3°F | Resp 18 | Ht 64.0 in | Wt 154.2 lb

## 2016-03-19 DIAGNOSIS — R197 Diarrhea, unspecified: Secondary | ICD-10-CM | POA: Diagnosis not present

## 2016-03-19 DIAGNOSIS — C7800 Secondary malignant neoplasm of unspecified lung: Secondary | ICD-10-CM | POA: Diagnosis not present

## 2016-03-19 DIAGNOSIS — E119 Type 2 diabetes mellitus without complications: Secondary | ICD-10-CM

## 2016-03-19 DIAGNOSIS — Z5111 Encounter for antineoplastic chemotherapy: Secondary | ICD-10-CM

## 2016-03-19 DIAGNOSIS — Z452 Encounter for adjustment and management of vascular access device: Secondary | ICD-10-CM | POA: Diagnosis not present

## 2016-03-19 DIAGNOSIS — C187 Malignant neoplasm of sigmoid colon: Secondary | ICD-10-CM

## 2016-03-19 DIAGNOSIS — D630 Anemia in neoplastic disease: Secondary | ICD-10-CM | POA: Diagnosis not present

## 2016-03-19 DIAGNOSIS — N189 Chronic kidney disease, unspecified: Secondary | ICD-10-CM | POA: Diagnosis not present

## 2016-03-19 DIAGNOSIS — I10 Essential (primary) hypertension: Secondary | ICD-10-CM | POA: Diagnosis not present

## 2016-03-19 DIAGNOSIS — C189 Malignant neoplasm of colon, unspecified: Secondary | ICD-10-CM

## 2016-03-19 DIAGNOSIS — Z5112 Encounter for antineoplastic immunotherapy: Secondary | ICD-10-CM | POA: Diagnosis not present

## 2016-03-19 DIAGNOSIS — C787 Secondary malignant neoplasm of liver and intrahepatic bile duct: Secondary | ICD-10-CM

## 2016-03-19 DIAGNOSIS — Z95828 Presence of other vascular implants and grafts: Secondary | ICD-10-CM

## 2016-03-19 LAB — CBC WITH DIFFERENTIAL/PLATELET
BASO%: 0.3 % (ref 0.0–2.0)
Basophils Absolute: 0 10*3/uL (ref 0.0–0.1)
EOS%: 10.8 % — ABNORMAL HIGH (ref 0.0–7.0)
Eosinophils Absolute: 0.4 10*3/uL (ref 0.0–0.5)
HCT: 26.3 % — ABNORMAL LOW (ref 34.8–46.6)
HGB: 8.4 g/dL — ABNORMAL LOW (ref 11.6–15.9)
LYMPH%: 28.6 % (ref 14.0–49.7)
MCH: 30.2 pg (ref 25.1–34.0)
MCHC: 31.9 g/dL (ref 31.5–36.0)
MCV: 94.6 fL (ref 79.5–101.0)
MONO#: 0.5 10*3/uL (ref 0.1–0.9)
MONO%: 16.6 % — ABNORMAL HIGH (ref 0.0–14.0)
NEUT#: 1.4 10*3/uL — ABNORMAL LOW (ref 1.5–6.5)
NEUT%: 43.7 % (ref 38.4–76.8)
Platelets: 172 10*3/uL (ref 145–400)
RBC: 2.78 10*6/uL — ABNORMAL LOW (ref 3.70–5.45)
RDW: 15.2 % — ABNORMAL HIGH (ref 11.2–14.5)
WBC: 3.3 10*3/uL — ABNORMAL LOW (ref 3.9–10.3)
lymph#: 0.9 10*3/uL (ref 0.9–3.3)

## 2016-03-19 LAB — COMPREHENSIVE METABOLIC PANEL
ALBUMIN: 3.4 g/dL — AB (ref 3.5–5.0)
ALK PHOS: 148 U/L (ref 40–150)
ALT: 26 U/L (ref 0–55)
AST: 30 U/L (ref 5–34)
Anion Gap: 9 mEq/L (ref 3–11)
BILIRUBIN TOTAL: 0.22 mg/dL (ref 0.20–1.20)
BUN: 25.7 mg/dL (ref 7.0–26.0)
CO2: 24 meq/L (ref 22–29)
Calcium: 9.6 mg/dL (ref 8.4–10.4)
Chloride: 109 mEq/L (ref 98–109)
Creatinine: 1.6 mg/dL — ABNORMAL HIGH (ref 0.6–1.1)
EGFR: 40 mL/min/{1.73_m2} — ABNORMAL LOW (ref >90)
Glucose: 142 mg/dl — ABNORMAL HIGH (ref 70–140)
Potassium: 4.7 mEq/L (ref 3.5–5.1)
SODIUM: 142 meq/L (ref 136–145)
TOTAL PROTEIN: 7 g/dL (ref 6.4–8.3)

## 2016-03-19 LAB — UA PROTEIN, DIPSTICK - CHCC: Protein, ur: 100 mg/dL

## 2016-03-19 MED ORDER — SODIUM CHLORIDE 0.9 % IV SOLN
Freq: Once | INTRAVENOUS | Status: AC
  Administered 2016-03-19: 10:00:00 via INTRAVENOUS

## 2016-03-19 MED ORDER — ATROPINE SULFATE 1 MG/ML IJ SOLN: 0.5000 mg | Freq: Once | INTRAMUSCULAR | Status: DC | PRN

## 2016-03-19 MED ORDER — SODIUM CHLORIDE 0.9% FLUSH
10.0000 mL | INTRAVENOUS | Status: DC | PRN
  Filled 2016-03-19: qty 10

## 2016-03-19 MED ORDER — PALONOSETRON HCL INJECTION 0.25 MG/5ML
INTRAVENOUS | Status: AC
  Filled 2016-03-19: qty 5

## 2016-03-19 MED ORDER — SODIUM CHLORIDE 0.9 % IV SOLN
180.0000 mg/m2 | Freq: Once | INTRAVENOUS | Status: AC
  Administered 2016-03-19: 320 mg via INTRAVENOUS
  Filled 2016-03-19: qty 10

## 2016-03-19 MED ORDER — DEXAMETHASONE SODIUM PHOSPHATE 10 MG/ML IJ SOLN
INTRAMUSCULAR | Status: AC
  Filled 2016-03-19: qty 1

## 2016-03-19 MED ORDER — SODIUM CHLORIDE 0.9 % IV SOLN
400.0000 mg/m2 | Freq: Once | INTRAVENOUS | Status: AC
  Administered 2016-03-19: 720 mg via INTRAVENOUS
  Filled 2016-03-19: qty 36

## 2016-03-19 MED ORDER — PALONOSETRON HCL INJECTION 0.25 MG/5ML
0.2500 mg | Freq: Once | INTRAVENOUS | Status: AC
  Administered 2016-03-19: 0.25 mg via INTRAVENOUS

## 2016-03-19 MED ORDER — SODIUM CHLORIDE 0.9 % IV SOLN
5.0000 mg/kg | Freq: Once | INTRAVENOUS | Status: AC
  Administered 2016-03-19: 350 mg via INTRAVENOUS
  Filled 2016-03-19: qty 14

## 2016-03-19 MED ORDER — DEXAMETHASONE SODIUM PHOSPHATE 10 MG/ML IJ SOLN
10.0000 mg | Freq: Once | INTRAMUSCULAR | Status: AC
  Administered 2016-03-19: 10 mg via INTRAVENOUS

## 2016-03-19 MED ORDER — SODIUM CHLORIDE 0.9 % IJ SOLN
10.0000 mL | INTRAMUSCULAR | Status: DC | PRN
  Administered 2016-03-19: 10 mL via INTRAVENOUS
  Filled 2016-03-19: qty 10

## 2016-03-19 MED ORDER — SODIUM CHLORIDE 0.9 % IV SOLN
2200.0000 mg/m2 | INTRAVENOUS | Status: DC
  Administered 2016-03-19: 3950 mg via INTRAVENOUS
  Filled 2016-03-19: qty 79

## 2016-03-19 NOTE — Telephone Encounter (Signed)
Appointments complete per 2/28 los. Patient to get print out in infusion.

## 2016-03-19 NOTE — Patient Instructions (Signed)
[facility] Discharge Instructions for Patients Receiving Chemotherapy  Today you received the following chemotherapy agents Avastin, Irinotecan, Leucovorin, & 5FU  To help prevent nausea and vomiting after your treatment, we encourage you to take your nausea medication as directed.   If you develop nausea and vomiting that is not controlled by your nausea medication, call the clinic.   BELOW ARE SYMPTOMS THAT SHOULD BE REPORTED IMMEDIATELY:  *FEVER GREATER THAN 100.5 F  *CHILLS WITH OR WITHOUT FEVER  NAUSEA AND VOMITING THAT IS NOT CONTROLLED WITH YOUR NAUSEA MEDICATION  *UNUSUAL SHORTNESS OF BREATH  *UNUSUAL BRUISING OR BLEEDING  TENDERNESS IN MOUTH AND THROAT WITH OR WITHOUT PRESENCE OF ULCERS  *URINARY PROBLEMS  *BOWEL PROBLEMS  UNUSUAL RASH Items with * indicate a potential emergency and should be followed up as soon as possible.  Feel free to call the clinic you have any questions or concerns. The clinic phone number is [phone].  Please show the CHEMO ALERT CARD at check-in to the Emergency Department and triage nurse.    

## 2016-03-19 NOTE — Patient Instructions (Signed)

## 2016-03-21 ENCOUNTER — Ambulatory Visit (HOSPITAL_BASED_OUTPATIENT_CLINIC_OR_DEPARTMENT_OTHER): Payer: BLUE CROSS/BLUE SHIELD

## 2016-03-21 VITALS — BP 143/69 | HR 90 | Temp 98.0°F | Resp 18

## 2016-03-21 DIAGNOSIS — C787 Secondary malignant neoplasm of liver and intrahepatic bile duct: Principal | ICD-10-CM

## 2016-03-21 DIAGNOSIS — C187 Malignant neoplasm of sigmoid colon: Secondary | ICD-10-CM

## 2016-03-21 DIAGNOSIS — C189 Malignant neoplasm of colon, unspecified: Secondary | ICD-10-CM

## 2016-03-21 DIAGNOSIS — Z452 Encounter for adjustment and management of vascular access device: Secondary | ICD-10-CM

## 2016-03-21 MED ORDER — HEPARIN SOD (PORK) LOCK FLUSH 100 UNIT/ML IV SOLN
500.0000 [IU] | Freq: Once | INTRAVENOUS | Status: AC | PRN
  Administered 2016-03-21: 500 [IU]
  Filled 2016-03-21: qty 5

## 2016-03-21 MED ORDER — SODIUM CHLORIDE 0.9% FLUSH
10.0000 mL | INTRAVENOUS | Status: DC | PRN
  Administered 2016-03-21: 10 mL
  Filled 2016-03-21: qty 10

## 2016-03-27 ENCOUNTER — Telehealth: Payer: Self-pay | Admitting: *Deleted

## 2016-03-27 NOTE — Telephone Encounter (Signed)
Yes, advise her that she can take up to 8 tab imodium a day. Thanks.   Truitt Merle MD

## 2016-03-27 NOTE — Telephone Encounter (Signed)
"  I have a question for Dr. Burr Medico.  I have used the Imodium caplets for diarrhea since Friday when the diarrhea started.  My husband purchased the Liqui Gel caps this time and today I had a liquid diarrhea stool again.  Is there a difference in the gel and the caplets.  I went almost two days without diarrhea so my body can tell a diiference.  I take the maximum of four pills can I take more?  Having two to three stools daily.  I'm not tired weak or light headed.  I'm eating."  Offered she come in to be seen.  Denies need and has "no transportation tomorrow because my husband has to work".  Instructed to FF, take imodium until she goes 12 hours with no BM.  HOWEVER we need to check labs if feeling poorly.  Advised she go to the ED if diarrhea increases.  Will notify provider.  Return number [phone].

## 2016-03-28 ENCOUNTER — Encounter (HOSPITAL_COMMUNITY): Payer: Self-pay | Admitting: Emergency Medicine

## 2016-03-28 ENCOUNTER — Emergency Department (HOSPITAL_COMMUNITY)
Admission: EM | Admit: 2016-03-28 | Discharge: 2016-03-29 | Disposition: A | Discharge status: Home / Self Care | Payer: BLUE CROSS/BLUE SHIELD | Attending: Emergency Medicine | Admitting: Emergency Medicine

## 2016-03-28 ENCOUNTER — Emergency Department (HOSPITAL_COMMUNITY): Payer: BLUE CROSS/BLUE SHIELD

## 2016-03-28 DIAGNOSIS — R197 Diarrhea, unspecified: Secondary | ICD-10-CM | POA: Diagnosis present

## 2016-03-28 DIAGNOSIS — K529 Noninfective gastroenteritis and colitis, unspecified: Secondary | ICD-10-CM | POA: Diagnosis not present

## 2016-03-28 DIAGNOSIS — Z87891 Personal history of nicotine dependence: Secondary | ICD-10-CM | POA: Insufficient documentation

## 2016-03-28 DIAGNOSIS — Z79899 Other long term (current) drug therapy: Secondary | ICD-10-CM | POA: Insufficient documentation

## 2016-03-28 DIAGNOSIS — Z794 Long term (current) use of insulin: Secondary | ICD-10-CM | POA: Insufficient documentation

## 2016-03-28 DIAGNOSIS — E119 Type 2 diabetes mellitus without complications: Secondary | ICD-10-CM | POA: Diagnosis not present

## 2016-03-28 DIAGNOSIS — Z8505 Personal history of malignant neoplasm of liver: Secondary | ICD-10-CM | POA: Diagnosis not present

## 2016-03-28 DIAGNOSIS — I10 Essential (primary) hypertension: Secondary | ICD-10-CM | POA: Insufficient documentation

## 2016-03-28 DIAGNOSIS — Z85038 Personal history of other malignant neoplasm of large intestine: Secondary | ICD-10-CM | POA: Diagnosis not present

## 2016-03-28 LAB — CBC
HCT: 25 % — ABNORMAL LOW (ref 36.0–46.0)
HEMOGLOBIN: 8.4 g/dL — AB (ref 12.0–15.0)
MCH: 30.4 pg (ref 26.0–34.0)
MCHC: 33.6 g/dL (ref 30.0–36.0)
MCV: 90.6 fL (ref 78.0–100.0)
Platelets: 136 10*3/uL — ABNORMAL LOW (ref 150–400)
RBC: 2.76 MIL/uL — AB (ref 3.87–5.11)
RDW: 14.6 % (ref 11.5–15.5)
WBC: 1.7 10*3/uL — AB (ref 4.0–10.5)

## 2016-03-28 LAB — COMPREHENSIVE METABOLIC PANEL
ALK PHOS: 107 U/L (ref 38–126)
ALT: 26 U/L (ref 14–54)
ANION GAP: 8 (ref 5–15)
AST: 33 U/L (ref 15–41)
Albumin: 3.8 g/dL (ref 3.5–5.0)
BILIRUBIN TOTAL: 0.8 mg/dL (ref 0.3–1.2)
BUN: 25 mg/dL — ABNORMAL HIGH (ref 6–20)
CALCIUM: 9.2 mg/dL (ref 8.9–10.3)
CO2: 24 mmol/L (ref 22–32)
Chloride: 104 mmol/L (ref 101–111)
Creatinine, Ser: 2.09 mg/dL — ABNORMAL HIGH (ref 0.44–1.00)
GFR calc non Af Amer: 24 mL/min — ABNORMAL LOW (ref >60)
GFR, EST AFRICAN AMERICAN: 28 mL/min — AB (ref >60)
Glucose, Bld: 104 mg/dL — ABNORMAL HIGH (ref 65–99)
Potassium: 3.8 mmol/L (ref 3.5–5.1)
SODIUM: 136 mmol/L (ref 135–145)
TOTAL PROTEIN: 7.1 g/dL (ref 6.5–8.1)

## 2016-03-28 LAB — URINALYSIS, ROUTINE W REFLEX MICROSCOPIC
BILIRUBIN URINE: NEGATIVE
Glucose, UA: NEGATIVE mg/dL
Ketones, ur: NEGATIVE mg/dL
Leukocytes, UA: NEGATIVE
NITRITE: NEGATIVE
PH: 5 (ref 5.0–8.0)
Protein, ur: >=300 mg/dL — AB
SPECIFIC GRAVITY, URINE: 1.013 (ref 1.005–1.030)

## 2016-03-28 LAB — LIPASE, BLOOD: Lipase: 15 U/L (ref 11–51)

## 2016-03-28 MED ORDER — SODIUM CHLORIDE 0.9 % IV BOLUS (SEPSIS)
1000.0000 mL | Freq: Once | INTRAVENOUS | Status: AC
  Administered 2016-03-28: 1000 mL via INTRAVENOUS

## 2016-03-28 MED ORDER — METOCLOPRAMIDE HCL 5 MG/ML IJ SOLN
10.0000 mg | Freq: Once | INTRAMUSCULAR | Status: AC
  Administered 2016-03-28: 10 mg via INTRAVENOUS
  Filled 2016-03-28: qty 2

## 2016-03-28 MED ORDER — IOPAMIDOL (ISOVUE-300) INJECTION 61%
INTRAVENOUS | Status: AC
  Filled 2016-03-28: qty 30

## 2016-03-28 MED ORDER — IOPAMIDOL (ISOVUE-300) INJECTION 61%: 30.0000 mL | Freq: Once | INTRAVENOUS | Status: DC | PRN

## 2016-03-28 NOTE — ED Provider Notes (Signed)
[facility] DEPT Provider Note   CSN: [id number] Arrival date & time: 03/28/16  1810     History   Chief Complaint Chief Complaint  Patient presents with  . Emesis  . Diarrhea    HPI Michelle Erickson is a 61 y.o. female.   Diarrhea   This is a new problem. The current episode started more than 2 days ago. The problem occurs 5 to 10 times per day. The problem has not changed since onset.The stool consistency is described as watery. There has been no fever. The fever has been present for less than 1 day. Pertinent negatives include no vomiting and no chills. She has tried nothing for the symptoms. The treatment provided no relief. Her past medical history does not include irritable bowel syndrome.    One week of diarrhea improved with immodium.  Still with nausea and some vomiting. Hasn't tried anything.  Some abdominal pain suprapubic area.    Past Medical History:  Diagnosis Date  . Back pain   . Diabetes mellitus without complication ([place])   . Family history of colon cancer   . Hyperlipemia   . Hypertension   . Metastatic colon cancer to liver ([place]) 07/21/2014  . Snoring   . Wears glasses    contacts/glasses    Patient Active Problem List   Diagnosis Date Noted  . Goals of care, counseling/discussion 01/31/2016  . Port catheter in place 05/16/2015  . Genetic testing 08/31/2014  . 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 DM (diabetes mellitus), type 2 ([place]) 08/11/2014  . Family history of colon cancer   . Metastatic colon cancer to liver ([place]) 07/21/2014    Past Surgical History:  Procedure Laterality Date  . CATARACT EXTRACTION Bilateral   . MOUTH SURGERY    . PORTACATH PLACEMENT N/A 08/03/2014   Procedure: INSERTION PORT-A-CATH;  Surgeon: Stark Klein, MD;  Location: [facility];  Service: General;  Laterality: N/A;    OB History    No data available       Home Medications    Prior to Admission medications   Medication Sig Start Date End Date Taking?  Authorizing Provider  ferrous sulfate 325 (65 FE) MG tablet Take 325 mg by mouth daily with breakfast.   Yes Historical Provider, MD  Insulin Glargine (LANTUS SOLOSTAR) 100 UNIT/ML Solostar Pen Inject 20 Units into the skin daily at 10 pm.    Yes Historical Provider, MD  insulin lispro (HUMALOG) 100 UNIT/ML injection Inject 4-8 Units into the skin 2 (two) times daily as needed for high blood sugar. Sliding scale   Yes Historical Provider, MD  lidocaine-prilocaine (EMLA) cream Apply 1 application topically once.  08/08/14  Yes Historical Provider, MD  cetirizine (ZYRTEC) 10 MG tablet Take 10 mg by mouth daily as needed for allergies. Reported on 05/02/2015    Historical Provider, MD  ciprofloxacin (CIPRO) 500 MG tablet Take 1 tablet (500 mg total) by mouth 2 (two) times daily. 03/29/16   Merrily Pew, MD  metroNIDAZOLE (FLAGYL) 500 MG tablet Take 1 tablet (500 mg total) by mouth 2 (two) times daily. 03/29/16   Merrily Pew, MD  ondansetron (ZOFRAN-ODT) 8 MG disintegrating tablet Take 8 mg by mouth every 8 (eight) hours as needed for nausea or vomiting. Reported on 05/02/2015    Historical Provider, MD    Family History Family History  Problem Relation Age of Onset  . COPD Father   . Heart attack Father   . Arthritis Sister   . Diabetes  Sister   . Colon cancer Sister     dx over 73  . Colon polyps Sister   . Seizures Brother   . Colon cancer Sister     dx in her early 42s, died in her early to mid 38s  . Diabetes Mother     Social History Social History  Substance Use Topics  . Smoking status: Former Smoker    Packs/day: 1.00    Years: 6.00    Types: Cigarettes    Quit date: 01/21/1976  . Smokeless tobacco: Never Used  . Alcohol use No     Allergies   Patient has no known allergies.   Review of Systems Review of Systems  Constitutional: Negative for chills.  Gastrointestinal: Positive for diarrhea. Negative for vomiting.  All other systems reviewed and are  negative.    Physical Exam Updated Vital Signs BP 133/59   Pulse 93   Temp 98.8 F (37.1 C) (Oral)   Resp 18   SpO2 95%   Physical Exam  Constitutional: She is oriented to person, place, and time. She appears well-developed and well-nourished.  HENT:  Head: Normocephalic and atraumatic.  Eyes: Conjunctivae and EOM are normal.  Neck: Normal range of motion.  Cardiovascular: Normal rate and regular rhythm.   Pulmonary/Chest: No stridor. No respiratory distress.  Abdominal: Soft. Bowel sounds are normal. She exhibits no distension.  Neurological: She is alert and oriented to person, place, and time.  Skin: Skin is warm and dry.  Nursing note and vitals reviewed.    ED Treatments / Results  Labs (all labs ordered are listed, but only abnormal results are displayed) Labs Reviewed  COMPREHENSIVE METABOLIC PANEL - Abnormal; Notable for the following:       Result Value   Glucose, Bld 104 (*)    BUN 25 (*)    Creatinine, Ser 2.09 (*)    GFR calc non Af Amer 24 (*)    GFR calc Af Amer 28 (*)    All other components within normal limits  CBC - Abnormal; Notable for the following:    WBC 1.7 (*)    RBC 2.76 (*)    Hemoglobin 8.4 (*)    HCT 25.0 (*)    Platelets 136 (*)    All other components within normal limits  URINALYSIS, ROUTINE W REFLEX MICROSCOPIC - Abnormal; Notable for the following:    APPearance CLOUDY (*)    Hgb urine dipstick MODERATE (*)    Protein, ur >=300 (*)    Bacteria, UA RARE (*)    Squamous Epithelial / LPF 0-5 (*)    All other components within normal limits  GASTROINTESTINAL PANEL BY PCR, STOOL (REPLACES STOOL CULTURE)  LIPASE, BLOOD    EKG  EKG Interpretation None       Radiology Ct Abdomen Pelvis Wo Contrast  Result Date: 03/29/2016 CLINICAL DATA:  Initial evaluation for metastatic disease, history of colon cancer. EXAM: CT ABDOMEN AND PELVIS WITHOUT CONTRAST TECHNIQUE: Multidetector CT imaging of the abdomen and pelvis was performed  following the standard protocol without IV contrast. COMPARISON:  Prior PET-CT from 01/28/2016. FINDINGS: Lower chest: 4 mm right middle lobe nodule appears mildly increased in size from previous (series 6, image 1), partially visualized. Additional left lower lobe nodule also slightly increased in size now measuring 11 x 11 mm (series 6, image 34). No other new nodules identified. Superimposed bibasilar atelectasis. Prominent coronary artery calcifications partially visualized. Distal esophagus somewhat dilated and patulous, similar to previous. Hepatobiliary:  Known hepatic metastases not well visualized on this noncontrast examination. The cholelithiasis present. No imaging findings to suggest acute cholecystitis. No biliary dilatation. Pancreas: Pancreas within normal limits. Spleen: Spleen within normal limits. Adrenals/Urinary Tract: Mild diffuse thickening of the adrenal glands bilaterally without focal lesion, similar to previous. Kidneys equal in size without nephrolithiasis or hydronephrosis. No focal renal lesions identified on this noncontrast examination. No hydroureter. Bladder within normal limits. Stomach/Bowel: Stomach within normal limits. Small bowel of normal caliber without evidence for obstruction. Appendix well visualized within the right lower quadrant and is within normal limits without findings to suggest acute appendicitis. There is fairly diffuse circumferential wall thickening about much of the colon, essentially extending from the cecum through the rectum, suggesting pancolitis. Intraluminal fluid density compatible with associated diarrheal illness. Associated mild hazy stranding present within a few regions. There is a superimposed somewhat more nodular/ masslike region of well thickening within the sigmoid colon, suspected to be related to patient's known primary adenocarcinoma (series 2, image 70 on axial sequence, series 3, image 74 on coronal sequence). No other acute abnormality  identified about the bowels. Vascular/Lymphatic: Moderate to advanced atheromatous plaque throughout the intra-abdominal aorta and its branch vessels. No pathologically enlarged intra-abdominal or pelvic lymph nodes. No peritoneal implants. Reproductive: Uterus and ovaries within normal limits. Other: Small fat containing paraumbilical hernia noted. No free fluid. No free air. Musculoskeletal: Grade 1 anterolisthesis of L4 on L5 with associated facet arthropathy. No acute osseous abnormality. No worrisome lytic or blastic osseous lesions. IMPRESSION: 1. Diffuse circumferential wall thickening throughout the colon, suggesting acute colitis. Intraluminal fluid density compatible with associated diarrheal illness. 2. Superimposed nodular area of bowel wall thickening within the sigmoid colon suspected to be related to patient's known primary adenocarcinoma. 3. Poor visualization of known hepatic metastases. No other evidence for metastatic disease within the abdomen and pelvis. Previously identified pulmonary nodules within the left lower and right middle lobes have slightly increased in size, concerning for slightly progressive metastatic disease. 4. Cholelithiasis. 5. Diffuse atherosclerosis with 3 vessel coronary artery calcifications. Electronically Signed   By: Jeannine Boga M.D.   On: 03/29/2016 00:21    Procedures Procedures (including critical care time)  Medications Ordered in ED Medications  iopamidol (ISOVUE-300) 61 % injection 30 mL (not administered)  iopamidol (ISOVUE-300) 61 % injection (not administered)  ciprofloxacin (CIPRO) tablet 500 mg (not administered)  metroNIDAZOLE (FLAGYL) tablet 500 mg (not administered)  sodium chloride 0.9 % bolus 1,000 mL (1,000 mLs Intravenous New Bag/Given 03/28/16 2231)  metoCLOPramide (REGLAN) injection 10 mg (10 mg Intravenous Given 03/28/16 2231)     Initial Impression / Assessment and Plan / ED Course  I have reviewed the triage vital signs and  the nursing notes.  Pertinent labs & imaging results that were available during my care of the patient were reviewed by me and considered in my medical decision making (see chart for details).     Colitis. Slightly low wbc, but don't expect it to get lower. Tolerating by mouth here. Some dehydration with a given some fluids so unlikely to be significant. Will follow-up with her primary doctor Monday otherwise return here for new or worsening symptoms.  Final Clinical Impressions(s) / ED Diagnoses   Final diagnoses:  Colitis    New Prescriptions New Prescriptions   CIPROFLOXACIN (CIPRO) 500 MG TABLET    Take 1 tablet (500 mg total) by mouth 2 (two) times daily.   METRONIDAZOLE (FLAGYL) 500 MG TABLET    Take 1 tablet (  500 mg total) by mouth 2 (two) times daily.     Merrily Pew, MD 03/29/16 [phone]

## 2016-03-28 NOTE — ED Triage Notes (Addendum)
Patient states that she had chemo last Wed for colon cancer and having n/v/d since.  Patient has been taking imodium taken twice today but still having diarrhea.

## 2016-03-29 MED ORDER — CIPROFLOXACIN HCL 500 MG PO TABS
500.0000 mg | ORAL_TABLET | Freq: Once | ORAL | Status: AC
  Administered 2016-03-29: 500 mg via ORAL
  Filled 2016-03-29: qty 1

## 2016-03-29 MED ORDER — HEPARIN SOD (PORK) LOCK FLUSH 100 UNIT/ML IV SOLN
500.0000 [IU] | Freq: Once | INTRAVENOUS | Status: AC
  Administered 2016-03-29: 500 [IU]
  Filled 2016-03-29 (×2): qty 5

## 2016-03-29 MED ORDER — METRONIDAZOLE 500 MG PO TABS: 500.0000 mg | ORAL_TABLET | Freq: Two times a day (BID) | ORAL | 0 refills | Status: DC

## 2016-03-29 MED ORDER — CIPROFLOXACIN HCL 500 MG PO TABS: 500.0000 mg | ORAL_TABLET | Freq: Two times a day (BID) | ORAL | 0 refills | Status: DC

## 2016-03-29 MED ORDER — METRONIDAZOLE 500 MG PO TABS
500.0000 mg | ORAL_TABLET | Freq: Once | ORAL | Status: AC
  Administered 2016-03-29: 500 mg via ORAL
  Filled 2016-03-29: qty 1

## 2016-03-29 NOTE — ED Notes (Signed)
Unable to collect specimen due to patient being unable to hold her bowels before we could get collection container there.

## 2016-03-29 NOTE — ED Notes (Signed)
Pt ambulatory and independent at discharge.  Verbalized understanding of discharge instructions 

## 2016-03-31 ENCOUNTER — Telehealth: Payer: Self-pay

## 2016-03-31 NOTE — Telephone Encounter (Signed)
"  I went to the ED Friday.  I continued with diarrhea, started vomiting, I was dehydrated, CT scan showed a colon infection.  I was started on two antibiotics.  (Cipro Flagyl for colitis).  I was told to tell my PCP but would like you to tell Dr. Burr Medico.  The Emergency room doctor said NO chemotherapy this week so I'll come in 04-16-2016 for my next chemotherapy.  I'm feeling a little better.  Thanks for calling to check on me."    Noted provider response for imodium use upon return to work today prompted call to patient for follow up on last weeks call.

## 2016-03-31 NOTE — Telephone Encounter (Signed)
Pt was dx colitis in ED on 3/10 she was given 14 days of cipro and 14 days of flagyl. These will be finished on 3/23.  The pt has chemo appt on 3/14. She is wanting to know if Dr Burr Medico wants to cancel this appt and keep her on schedule with her next appt 3/28.  She states she is feeling a lot better, she is eating and drinking well.

## 2016-04-01 NOTE — Telephone Encounter (Signed)
S/w Dr Burr Medico and called pt back. Dr Burr Medico stated if pt is feeling much better to make next infusion appt 3/21 but if pt is adamant can keep schedule 3/28. The pt will cancel appt 3/14 and keep appt 3/28. This is because her husband needs to keep this schedule for work purposes and he is her driver.

## 2016-04-02 ENCOUNTER — Other Ambulatory Visit: Payer: BLUE CROSS/BLUE SHIELD

## 2016-04-02 ENCOUNTER — Ambulatory Visit: Payer: BLUE CROSS/BLUE SHIELD

## 2016-04-14 NOTE — Progress Notes (Signed)
[facility]  [phone] [phone]  Clinic Follow Up Note   Patient Care Team: Dione Housekeeper, MD as PCP - General (Family Medicine) Tania Ade, RN as Registered Nurse (Oncology) 04/16/2016   CHIEF COMPLAINTS:  Follow-up of metastatic colon cancer  Oncology History   Metastatic colon cancer to liver   Staging form: Colon and Rectum, AJCC 7th Edition     Clinical: Stage Unknown ([place], NX, M1) - Unsigned        Metastatic colon cancer to liver ([place])   07/14/2014 Imaging    CT abdomen and pelvis with contrast showed a 6 cm segment of sigmoid bowel wall thickening with adjacent 14 x 12 mm nodules, diffuse hepatic metastasis. No bowel obstruction.      07/14/2014 Initial Diagnosis    Metastatic colon cancer to liver      07/25/2014 Procedure    Colonoscopy showed a near circumferential medial mass in the sigmoid colon, partially obstructing consistent with carcinoma.      07/25/2014 Initial Biopsy    Sigmoid: Biopsy showed invasive adenocarcinoma arising in a background of high-grade dysplasia.      07/28/2014 Imaging    CT chest showed no evidence of metastasis      08/09/2014 - 01/16/2016 Chemotherapy    mFOLFOX6 every 2 weeks, dose reduction from cycle 7 due to cytopenia, Avastin added from cycle 3, stopped due to disease progression       08/11/2014 - 08/15/2014 Hospital Admission    Pt was admitted for fever and nausea, treated for UTI, liver biopsy cancelled due to the infection       10/29/2015 Imaging    CT chest, abdomen and pelvis with contrast 10/29/2015 IMPRESSION: 1. New/enlarging pulmonary nodules, most notably the 8 by 6 mm left lower lobe pulmonary nodule, favoring metastatic disease. 2. The liver lesions are subjectively grossly similar in size, difficult to measure due to the lack of IV contrast causing poor boundaries. 3. There is some faint heterogeneity of bony density in  the sternum, but this is not significantly changed from 07/28/2014 and is probably incidental given the lack of other evidence of osseous metastatic disease. 4. Lower lumbar spondylosis and degenerative disc disease causing impingement. 5. Coronary, aortic arch, and branch vessel atherosclerotic vascular disease. Aortoiliac atherosclerotic vascular disease. 6. Mildly dilated esophagus with air-fluid level, probably reflecting dysmotility. 7. Cholelithiasis. 8. Thick wall urinary bladder, cystitis not excluded.      01/28/2016 Progression    Restaging CT showed disease progression in lungs, and probably progression in liver mets also      02/14/2016 PET scan    IMPRESSION: 1. Examination is positive for hypermetabolic tumor within both lobes of liver. Index measurements provided above. 2. There are scattered pulmonary nodules within both lungs, most of these are too small to reliably characterize by PET-CT. In the left lower lobe there is a lung nodule which exhibits malignant range FDG uptake measuring 9 mm. 3. Aortic atherosclerosis and coronary artery calcifications.      02/20/2016 -  Chemotherapy    Second line chemo FOLFIRI and Avastin, every 2 weeks        HISTORY OF PRESENTING ILLNESS:  Michelle Erickson 61 y.o. female is here because of abnoaml CT findings which is highly suspecious for metastatic colon cancer.   She presented intermittent nausea, anorexia and abdominal pain since Nov 2015, she has mild pain in the LUQ, crapy pain, positional (bending over),   it happened once every  2-3 weeks, and it has been more frequent in the past month. She has normal BM, but did notice the caliber of the stool is smaller than before. She denies any melena or hematochezia. She lost about 13 lbs in the past 6 months.   She presents to local emergency room in November 2015, and May 2016, was felt to be related to gastric virus. Due to the worsening symptoms lately, she went to [facility] ED on 07/13/2014, CT abdomen was obtained which showed multiple large liver metastasis and probable sigmoid colon mass. She was referred to [place] for further workup. She is scheduled to see GI Dr. Olevia Perches this afternoon.  CURRENT THERAPY: second line FOLFIRI and Avastin every 2 weeks, started 02/20/2016  INTERIM HISTORY Kerilyn returns for follow-up and cycle 4 chemo. She was discovered to have a colon infection about nine days after cycle 3 chemotherapy. She had diarrhea and went to the ER on 03/28/2016 when it would not stop. She also had vomiting. She is taking Cipro. She did not experience diarrhea with cycle 1 but did have some with cycle 2. Her bowels are now back to normal. She is eating normally. She reports bilateral feet swelling. She puts her feet up when she sits down and this helps reduce swelling.   MEDICAL HISTORY Past Medical History:  Diagnosis Date  . Back pain   . Diabetes mellitus without complication ([place])   . Family history of colon cancer   . Hyperlipemia   . Hypertension   . Metastatic colon cancer to liver ([place]) 07/21/2014  . Snoring   . Wears glasses    contacts/glasses    SURGICAL HISTORY: Past Surgical History:  Procedure Laterality Date  . CATARACT EXTRACTION Bilateral   . MOUTH SURGERY    . PORTACATH PLACEMENT N/A 08/03/2014   Procedure: INSERTION PORT-A-CATH;  Surgeon: Stark Klein, MD;  Location: [facility];  Service: General;  Laterality: N/A;    SOCIAL HISTORY: Social History   Social History  . Marital status: Married    Spouse name: Charlotte Crumb  . Number of children: 2  . Years of education: N/A   Occupational History  . Not on file.   Social History Main Topics  . Smoking status: Former Smoker    Packs/day: 1.00    Years: 6.00    Types: Cigarettes    Quit date: 01/21/1976  . Smokeless tobacco: Never Used  . Alcohol use No  . Drug use: No  . Sexual activity: Yes    Birth control/ protection: Post-menopausal     Comment: # 2 pregnancies and #2  live births   Other Topics Concern  . Not on file   Social History Narrative   Married, husband [name]   Has #2 adult children   Previously worked at [employer] HISTORY: Family History  Problem Relation Age of Onset  . COPD Father   . Heart attack Father   . Arthritis Sister   . Diabetes Sister   . Colon cancer Sister     dx over 54  . Colon polyps Sister   . Seizures Brother   . Colon cancer Sister     dx in her early 6s, died in her early to mid 32s  . Diabetes Mother     ALLERGIES:  has No Known Allergies.  MEDICATIONS:  Current Outpatient Prescriptions  Medication Sig Dispense Refill  . cetirizine (ZYRTEC) 10 MG tablet Take 10 mg by mouth  daily as needed for allergies. Reported on 05/02/2015    . ciprofloxacin (CIPRO) 500 MG tablet Take 1 tablet (500 mg total) by mouth 2 (two) times daily. 28 tablet 0  . ferrous sulfate 325 (65 FE) MG tablet Take 325 mg by mouth daily with breakfast.    . Insulin Glargine (LANTUS SOLOSTAR) 100 UNIT/ML Solostar Pen Inject 20 Units into the skin daily at 10 pm.     . insulin lispro (HUMALOG) 100 UNIT/ML injection Inject 4-8 Units into the skin 2 (two) times daily as needed for high blood sugar. Sliding scale    . lidocaine-prilocaine (EMLA) cream Apply 1 application topically once.     . metroNIDAZOLE (FLAGYL) 500 MG tablet Take 1 tablet (500 mg total) by mouth 2 (two) times daily. 28 tablet 0  . ondansetron (ZOFRAN-ODT) 8 MG disintegrating tablet Take 8 mg by mouth every 8 (eight) hours as needed for nausea or vomiting. Reported on 05/02/2015    . diphenoxylate-atropine (LOMOTIL) 2.5-0.025 MG tablet Take 1-8 tablets by mouth 4 (four) times daily as needed for diarrhea or loose stools. 60 tablet 1   No current facility-administered medications for this visit.     REVIEW OF SYSTEMS:   Constitutional: Denies fevers, chills or abnormal night sweats. Eyes: Denies blurriness of vision, double vision or watery eyes Ears,  nose, mouth, throat, and face: Denies mucositis or sore throat Respiratory: Denies cough, dyspnea or wheezes Cardiovascular: Denies palpitation, chest discomfort (+) bilateral lower extremity swelling Gastrointestinal:  Denies nausea, heartburn, (+) diarrhea following chemo, cramping Skin: Denies abnormal skin rashes Lymphatics: Denies new lymphadenopathy or easy bruising Neurological:Denies numbness, tingling or new weaknesses Behavioral/Psych: Mood is stable, no new changes  All other systems were reviewed with the patient and are negative.  PHYSICAL EXAMINATION: ECOG PERFORMANCE STATUS: 0  BP (!) 144/61 (BP Location: Left Arm, Patient Position: Sitting)   Pulse 88   Temp 98.6 F (37 C) (Oral)   Resp 18   Ht _0  (1.626 m)   Wt 156 lb (70.8 kg)   SpO2 100%   BMI 26.78 kg/m    GENERAL:alert, no distress and comfortable. SKIN: skin color, texture, turgor are normal, no rashes or significant lesions EYES: normal, conjunctiva are pink and non-injected, sclera clear OROPHARYNX:no exudate, no erythema and lips, buccal mucosa, and tongue normal  NECK: supple, thyroid normal size, non-tender, without nodularity LYMPH:  no palpable lymphadenopathy in the cervical, axillary or inguinal LUNGS: clear to auscultation and percussion with normal breathing effort HEART: regular rate & rhythm and no murmurs and no lower extremity edema ABDOMEN:abdomen soft, non-tender and normal bowel sounds Musculoskeletal:no cyanosis of digits and no clubbing  PSYCH: alert & oriented x 3 with fluent speech NEURO: no focal motor/sensory deficits  LABORATORY DATA:  I have reviewed the data as listed CBC Latest Ref Rng & Units 04/16/2016 03/28/2016 03/19/2016  WBC 3.9 - 10.3 10e3/uL 5.2 1.7(L) 3.3(L)  Hemoglobin 11.6 - 15.9 g/dL 8.3(L) 8.4(L) 8.4(L)  Hematocrit 34.8 - 46.6 % 26.0(L) 25.0(L) 26.3(L)  Platelets 145 - 400 10e3/uL 141(L) 136(L) 172   CMP Latest Ref Rng & Units 04/16/2016 03/28/2016 03/19/2016    Glucose 70 - 140 mg/dl 124 104(H) 142(H)  BUN 7.0 - 26.0 mg/dL 17.9 25(H) 25.7  Creatinine 0.6 - 1.1 mg/dL 1.2(H) 2.09(H) 1.6(H)  Sodium 136 - 145 mEq/L 140 136 142  Potassium 3.5 - 5.1 mEq/L 4.3 3.8 4.7  Chloride 101 - 111 mmol/L - 104 -  CO2 22 - 29 mEq/L  _0 Calcium 8.4 - 10.4 mg/dL 9.2 9.2 9.6  Total Protein 6.4 - 8.3 g/dL 6.3(L) 7.1 7.0  Total Bilirubin 0.20 - 1.20 mg/dL 0.29 0.8 0.22  Alkaline Phos 40 - 150 U/L 115 107 148  AST 5 - 34 U/L 42(H) 33 30  ALT 0 - 55 U/L _1 CEA (0-5ng/ml) 07/21/2014: 594.5 11/23/2014: 18.2 03/07/2015: 6.6 09/19/2015: 37.9 10/29/2015: 63.7 11/28/2015: 28.99 01/02/2016: 40.81 01/28/2015: 47 03/05/16: 62.66 TODAY PENDING   PATHOLOGY REPORTS: Diagnosis 07/25/2014  Surgical [P], sigmoid - INVASIVE ADENOCARCINOMA ARISING IN A BACKGROUND OF HIGH GRADE DYSPLASIA. - SEE COMMENT.  Diagnosis 09/05/2014 Liver, biopsy - METASTATIC ADENOCARCINOMA, SEE COMMENT.   RADIOGRAPHIC STUDIES: I have personally reviewed the radiological images as listed and agreed with the findings in the report.  CT Abdomen pelvis without contrast 03/28/2016 IMPRESSION: 1. Diffuse circumferential wall thickening throughout the colon, suggesting acute colitis. Intraluminal fluid density compatible with associated diarrheal illness. 2. Superimposed nodular area of bowel wall thickening within the sigmoid colon suspected to be related to patient's known primary adenocarcinoma. 3. Poor visualization of known hepatic metastases. No other evidence for metastatic disease within the abdomen and pelvis. Previously identified pulmonary nodules within the left lower and right middle lobes have slightly increased in size, concerning for slightly progressive metastatic disease. 4. Cholelithiasis. 5. Diffuse atherosclerosis with 3 vessel coronary artery calcifications.  PET 02/14/2016 IMPRESSION: 1. Examination is positive for hypermetabolic tumor within both lobes of liver.  Index measurements provided above. 2. There are scattered pulmonary nodules within both lungs, most of these are too small to reliably characterize by PET-CT. In the left lower lobe there is a lung nodule which exhibits malignant range FDG uptake measuring 9 mm. 3. Aortic atherosclerosis and coronary artery calcifications.  Colonoscopy 07/25/2014 Dr. Olevia Perches  ENDOSCOPIC IMPRESSION: Near circumferential medium mass was found in the sigmoid colon; multiple biopsies were performed using cold forceps, partially obstructing mass consistent with carcinoma. Placement of endoclips and tattoo at the margins of the mass which extends from 18-25 cm from the anus  ASSESSMENT & PLAN: 61 y.o. African-American female, presented with intermittent nausea vomiting and anemia.   1. 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 -The natural history of metastatic colon cancer and treatment options were discussed with patient and her husband. Giving the diffuse metastasis in the liver, this is unfortunately incurable disease. I recommend systemic chemotherapy to control her metastatic disease, the goal of therapy is palliative and to prolong her life. -I previously reviewed her restaging CT scan from 01/28/2016, which showed slight disease progression in lungs, especailly the LLL lung nodule. Due to lack of CT contrast, it is difficult to evaluate the metastasis in the liver, but it appears to be progression also. -I previously discussed her PET scan findings, which showed hypermetabolic multiple liver metastasis and a few lung metastasis.  -she is on second line chemo FOLFIRI and Avastin, tolerating well overall    -lab reviewed, adequate for treatment, we'll proceed cycle 4 chemotherapy today.  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 -Her ferritin is normal, serum iron and saturation slightly low, TIBC low normal, I suggest her to take oral iron supplement, she tolerates well, we'll continue once daily, with orange juice or vitamin C. -Iron 03/05/2016 was 64. -Her anemia has been stable overall, hemoglobin 8.3 TODAY,  Stable overall, she is asymptomatic, we'll continue observation.  3. DM, HTN -Continue follow-up with PCP - we previously discussed the potential impact of chemotherapy and premedication dexamethasone on her blood pressure and sugar,will  Monitor closely. Her BG has been well controlled lately  -Her blood pressure has been high lately, we'll repeat in the infusion room. I encouraged her to measure her blood pressure at home. We discussed Avastin can increase her blood pressure, need to be monitored closely. If SBP persistently high than 150, I'll consider adding on a new antihypertensive medication. She will follow-up with her primary care physician also.  4. 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 Will continue monitoring renal function  -I encourage her to drink more fluids -We'll avoid NSAIDs and IV contrast for CT scan in the future. -Avastin was held previously, however proteinuria is improved and we will proceed with Avastin today.  5. Goal of care discussion  -We previously discussed the incurable nature of her cancer, and the overall poor prognosis, especially if she does  not have good response to chemotherapy or progress on chemo -The patient understands the goal of care is palliative. -I recommended DNR/DNI, she will think about it.  6. Diarrhea secondary to chemotherapy -I advised the patient to continue using Imodium as needed following chemotherapy. She knows she may take 2 Imodium tablets initially if needed. -Will prescribe lomotil today -I encouraged the patient to maintain hydration.  Plan -Proceed with cycle 4 today. Irinotecan dose reduction to 130m/m2 due to her severe diarrhea  -continue Avastin if urine protein<300 -PET ordered to take place after Cycle 6. -I have written prescription for lomotil today -I will see the patient back in 2 weeks.  All questions were answered. The patient knows to call the clinic with any problems, questions or concerns.  I spent 25 minutes counseling the patient face to face. The total time spent in the appointment was 30 minutes and more than 50% was on counseling.  This document serves as a record of services personally performed by YTruitt Merle MD. It was created on her behalf by EArlyce Harman a trained medical scribe. The creation of this record is based on the scribe's personal observations and the provider's statements to them. This document has been checked and approved by the attending provider.    FTruitt Merle MD 04/16/2016

## 2016-04-16 ENCOUNTER — Other Ambulatory Visit (HOSPITAL_BASED_OUTPATIENT_CLINIC_OR_DEPARTMENT_OTHER): Payer: BLUE CROSS/BLUE SHIELD

## 2016-04-16 ENCOUNTER — Encounter: Payer: Self-pay | Admitting: Hematology

## 2016-04-16 ENCOUNTER — Ambulatory Visit (HOSPITAL_BASED_OUTPATIENT_CLINIC_OR_DEPARTMENT_OTHER): Payer: BLUE CROSS/BLUE SHIELD | Admitting: Hematology

## 2016-04-16 ENCOUNTER — Ambulatory Visit (HOSPITAL_BASED_OUTPATIENT_CLINIC_OR_DEPARTMENT_OTHER): Payer: BLUE CROSS/BLUE SHIELD

## 2016-04-16 ENCOUNTER — Telehealth: Payer: Self-pay | Admitting: Hematology

## 2016-04-16 VITALS — BP 144/61 | HR 88 | Temp 98.6°F | Resp 18 | Ht 64.0 in | Wt 156.0 lb

## 2016-04-16 DIAGNOSIS — C187 Malignant neoplasm of sigmoid colon: Secondary | ICD-10-CM

## 2016-04-16 DIAGNOSIS — Z452 Encounter for adjustment and management of vascular access device: Secondary | ICD-10-CM

## 2016-04-16 DIAGNOSIS — R197 Diarrhea, unspecified: Secondary | ICD-10-CM

## 2016-04-16 DIAGNOSIS — C787 Secondary malignant neoplasm of liver and intrahepatic bile duct: Secondary | ICD-10-CM

## 2016-04-16 DIAGNOSIS — N189 Chronic kidney disease, unspecified: Secondary | ICD-10-CM

## 2016-04-16 DIAGNOSIS — Z5111 Encounter for antineoplastic chemotherapy: Secondary | ICD-10-CM

## 2016-04-16 DIAGNOSIS — E119 Type 2 diabetes mellitus without complications: Secondary | ICD-10-CM

## 2016-04-16 DIAGNOSIS — D630 Anemia in neoplastic disease: Secondary | ICD-10-CM | POA: Diagnosis not present

## 2016-04-16 DIAGNOSIS — Z5112 Encounter for antineoplastic immunotherapy: Secondary | ICD-10-CM | POA: Diagnosis not present

## 2016-04-16 DIAGNOSIS — C189 Malignant neoplasm of colon, unspecified: Secondary | ICD-10-CM

## 2016-04-16 DIAGNOSIS — C7800 Secondary malignant neoplasm of unspecified lung: Secondary | ICD-10-CM

## 2016-04-16 DIAGNOSIS — I10 Essential (primary) hypertension: Secondary | ICD-10-CM | POA: Diagnosis not present

## 2016-04-16 LAB — COMPREHENSIVE METABOLIC PANEL
ALT: 26 U/L (ref 0–55)
ANION GAP: 7 meq/L (ref 3–11)
AST: 42 U/L — ABNORMAL HIGH (ref 5–34)
Albumin: 3.2 g/dL — ABNORMAL LOW (ref 3.5–5.0)
Alkaline Phosphatase: 115 U/L (ref 40–150)
BUN: 17.9 mg/dL (ref 7.0–26.0)
CALCIUM: 9.2 mg/dL (ref 8.4–10.4)
CHLORIDE: 106 meq/L (ref 98–109)
CO2: 27 meq/L (ref 22–29)
Creatinine: 1.2 mg/dL — ABNORMAL HIGH (ref 0.6–1.1)
EGFR: 56 mL/min/{1.73_m2} — AB (ref >90)
Glucose: 124 mg/dl (ref 70–140)
POTASSIUM: 4.3 meq/L (ref 3.5–5.1)
Sodium: 140 mEq/L (ref 136–145)
Total Bilirubin: 0.29 mg/dL (ref 0.20–1.20)
Total Protein: 6.3 g/dL — ABNORMAL LOW (ref 6.4–8.3)

## 2016-04-16 LAB — CBC WITH DIFFERENTIAL/PLATELET
BASO%: 0.4 % (ref 0.0–2.0)
BASOS ABS: 0 10*3/uL (ref 0.0–0.1)
EOS%: 4.2 % (ref 0.0–7.0)
Eosinophils Absolute: 0.2 10*3/uL (ref 0.0–0.5)
HEMATOCRIT: 26 % — AB (ref 34.8–46.6)
HGB: 8.3 g/dL — ABNORMAL LOW (ref 11.6–15.9)
LYMPH#: 1 10*3/uL (ref 0.9–3.3)
LYMPH%: 20 % (ref 14.0–49.7)
MCH: 29.4 pg (ref 25.1–34.0)
MCHC: 31.9 g/dL (ref 31.5–36.0)
MCV: 92.2 fL (ref 79.5–101.0)
MONO#: 0.6 10*3/uL (ref 0.1–0.9)
MONO%: 11.4 % (ref 0.0–14.0)
NEUT#: 3.3 10*3/uL (ref 1.5–6.5)
NEUT%: 64 % (ref 38.4–76.8)
PLATELETS: 141 10*3/uL — AB (ref 145–400)
RBC: 2.82 10*6/uL — AB (ref 3.70–5.45)
RDW: 16.5 % — ABNORMAL HIGH (ref 11.2–14.5)
WBC: 5.2 10*3/uL (ref 3.9–10.3)

## 2016-04-16 LAB — CEA (IN HOUSE-CHCC): CEA (CHCC-IN HOUSE): 92.49 ng/mL — AB (ref 0.00–5.00)

## 2016-04-16 LAB — UA PROTEIN, DIPSTICK - CHCC: PROTEIN: 100 mg/dL

## 2016-04-16 MED ORDER — DIPHENOXYLATE-ATROPINE 2.5-0.025 MG PO TABS: 1.0000 | ORAL_TABLET | Freq: Four times a day (QID) | ORAL | 1 refills | Status: DC | PRN

## 2016-04-16 MED ORDER — DEXAMETHASONE SODIUM PHOSPHATE 10 MG/ML IJ SOLN
10.0000 mg | Freq: Once | INTRAMUSCULAR | Status: AC
  Administered 2016-04-16: 10 mg via INTRAVENOUS

## 2016-04-16 MED ORDER — SODIUM CHLORIDE 0.9 % IV SOLN
400.0000 mg/m2 | Freq: Once | INTRAVENOUS | Status: AC
  Administered 2016-04-16: 720 mg via INTRAVENOUS
  Filled 2016-04-16: qty 36

## 2016-04-16 MED ORDER — DEXAMETHASONE SODIUM PHOSPHATE 10 MG/ML IJ SOLN
INTRAMUSCULAR | Status: AC
  Filled 2016-04-16: qty 1

## 2016-04-16 MED ORDER — SODIUM CHLORIDE 0.9 % IV SOLN
Freq: Once | INTRAVENOUS | Status: AC
  Administered 2016-04-16: 13:00:00 via INTRAVENOUS

## 2016-04-16 MED ORDER — ATROPINE SULFATE 1 MG/ML IJ SOLN: 0.5000 mg | Freq: Once | INTRAMUSCULAR | Status: DC | PRN

## 2016-04-16 MED ORDER — SODIUM CHLORIDE 0.9 % IV SOLN
160.0000 mg/m2 | Freq: Once | INTRAVENOUS | Status: AC
  Administered 2016-04-16: 280 mg via INTRAVENOUS
  Filled 2016-04-16: qty 10

## 2016-04-16 MED ORDER — SODIUM CHLORIDE 0.9% FLUSH
10.0000 mL | INTRAVENOUS | Status: DC | PRN
  Filled 2016-04-16: qty 10

## 2016-04-16 MED ORDER — SODIUM CHLORIDE 0.9% FLUSH
10.0000 mL | INTRAVENOUS | Status: DC | PRN
  Administered 2016-04-16: 10 mL via INTRAVENOUS
  Filled 2016-04-16: qty 10

## 2016-04-16 MED ORDER — SODIUM CHLORIDE 0.9 % IV SOLN
2200.0000 mg/m2 | INTRAVENOUS | Status: DC
  Administered 2016-04-16: 3950 mg via INTRAVENOUS
  Filled 2016-04-16: qty 79

## 2016-04-16 MED ORDER — PALONOSETRON HCL INJECTION 0.25 MG/5ML
0.2500 mg | Freq: Once | INTRAVENOUS | Status: AC
  Administered 2016-04-16: 0.25 mg via INTRAVENOUS

## 2016-04-16 MED ORDER — SODIUM CHLORIDE 0.9 % IV SOLN
5.0000 mg/kg | Freq: Once | INTRAVENOUS | Status: AC
  Administered 2016-04-16: 350 mg via INTRAVENOUS
  Filled 2016-04-16: qty 14

## 2016-04-16 MED ORDER — HEPARIN SOD (PORK) LOCK FLUSH 100 UNIT/ML IV SOLN
500.0000 [IU] | Freq: Once | INTRAVENOUS | Status: DC | PRN
  Filled 2016-04-16: qty 5

## 2016-04-16 MED ORDER — PALONOSETRON HCL INJECTION 0.25 MG/5ML
INTRAVENOUS | Status: AC
  Filled 2016-04-16: qty 5

## 2016-04-16 NOTE — Telephone Encounter (Signed)
Gave patient AVS and scheduled appts per 04/16/2016 los.

## 2016-04-16 NOTE — Progress Notes (Signed)
Ok to treat with urine protein 100 per Dr Feng.   

## 2016-04-16 NOTE — Patient Instructions (Signed)
[facility] Discharge Instructions for Patients Receiving Chemotherapy  Today you received the following chemotherapy agents Avastin, Irinotecan, Leucovorin, & 5FU  To help prevent nausea and vomiting after your treatment, we encourage you to take your nausea medication as directed.   If you develop nausea and vomiting that is not controlled by your nausea medication, call the clinic.   BELOW ARE SYMPTOMS THAT SHOULD BE REPORTED IMMEDIATELY:  *FEVER GREATER THAN 100.5 F  *CHILLS WITH OR WITHOUT FEVER  NAUSEA AND VOMITING THAT IS NOT CONTROLLED WITH YOUR NAUSEA MEDICATION  *UNUSUAL SHORTNESS OF BREATH  *UNUSUAL BRUISING OR BLEEDING  TENDERNESS IN MOUTH AND THROAT WITH OR WITHOUT PRESENCE OF ULCERS  *URINARY PROBLEMS  *BOWEL PROBLEMS  UNUSUAL RASH Items with * indicate a potential emergency and should be followed up as soon as possible.  Feel free to call the clinic you have any questions or concerns. The clinic phone number is [phone].  Please show the [facility] at check-in to the Emergency Department and triage nurse.

## 2016-04-16 NOTE — Addendum Note (Signed)
Addended by: Truitt Merle on: 04/16/2016 10:38 PM   Modules accepted: Orders

## 2016-04-17 IMAGING — CT CT CHEST W/ CM
2 of 3 series · 15 of 36 positions shown, 18 images · IV contrast (OMNIPAQUE)
Comparison: Abdomen and pelvis CT from 07/14/2014.

CLINICAL DATA: Subsequent encounter for metastatic colon cancer

EXAM:
CT CHEST WITH CONTRAST
TECHNIQUE: Multidetector CT imaging of the chest was performed during
intravenous contrast administration.
CONTRAST:  80mL OMNIPAQUE IOHEXOL 300 MG/ML  SOLN

[Series 2: chest with st · axial · 0.67mm/px · z∈[-264,-24]mm · 12 of 58 slices shown, 15 images]
[im 5/58  mediastinal]
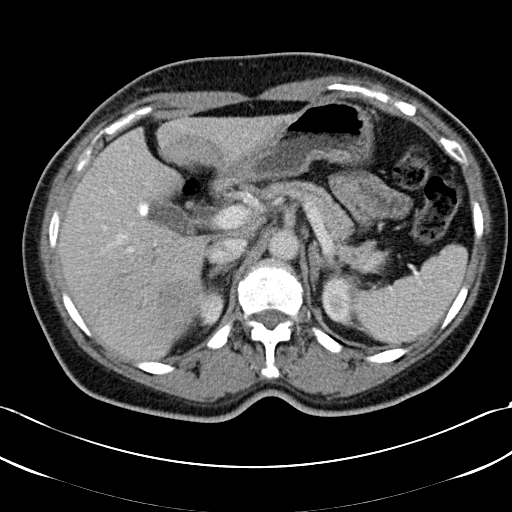
[im 5/58  lung]
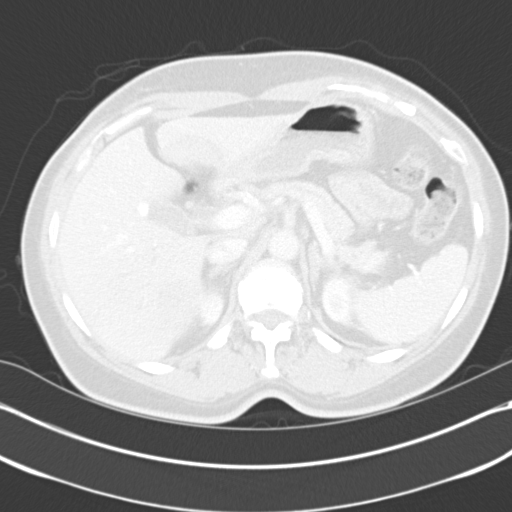
[im 9/58  lung]
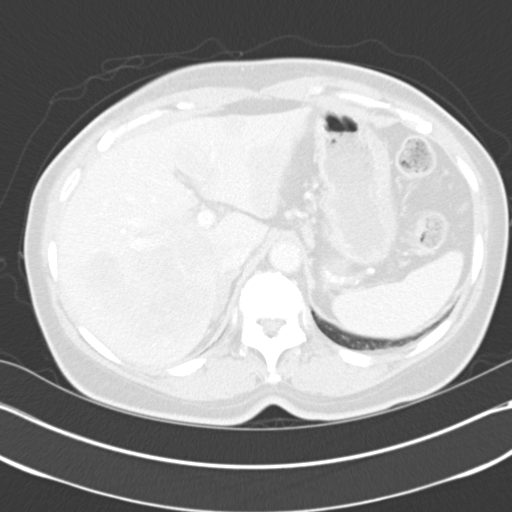
[im 13/58  lung]
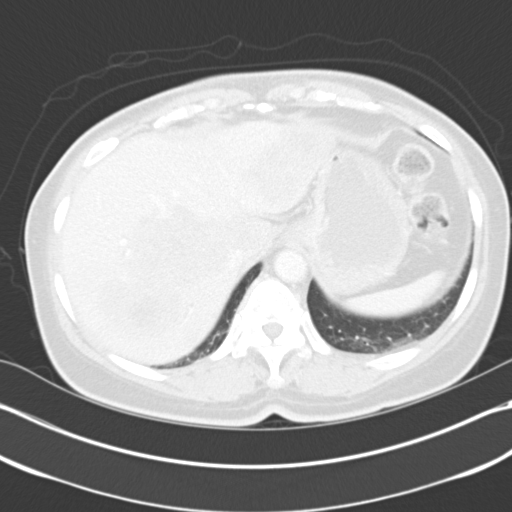
[im 17/58  lung]
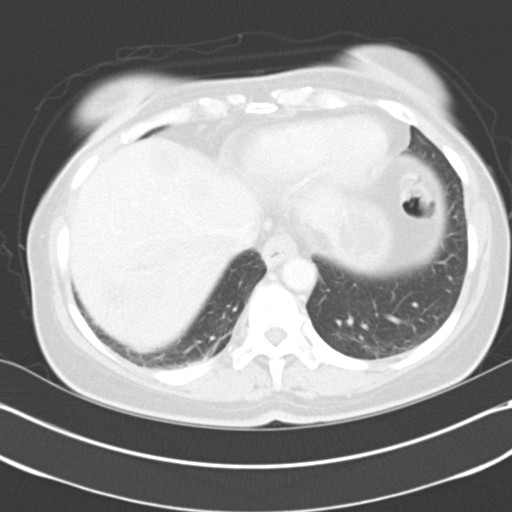
[im 22/58  mediastinal]
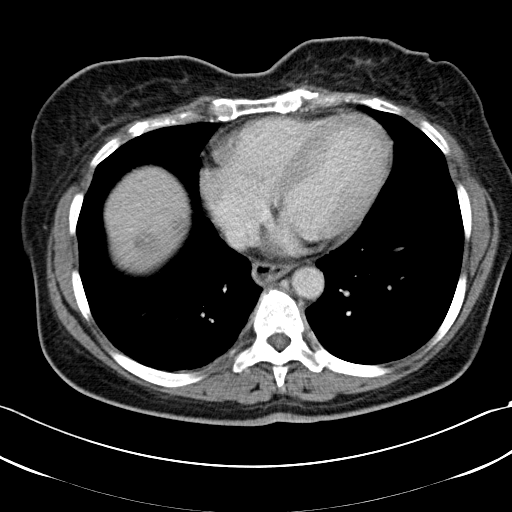
[im 22/58  lung]
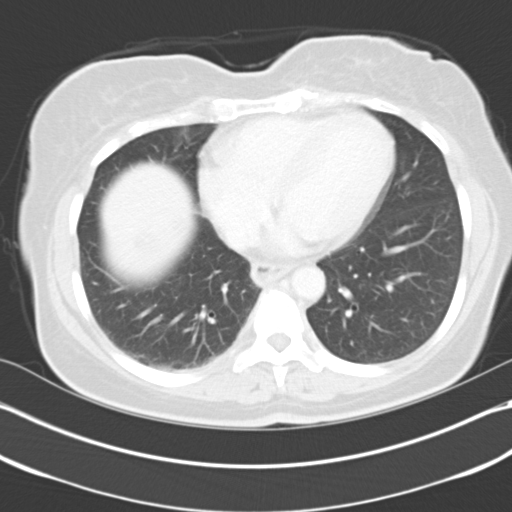
[im 26/58  lung]
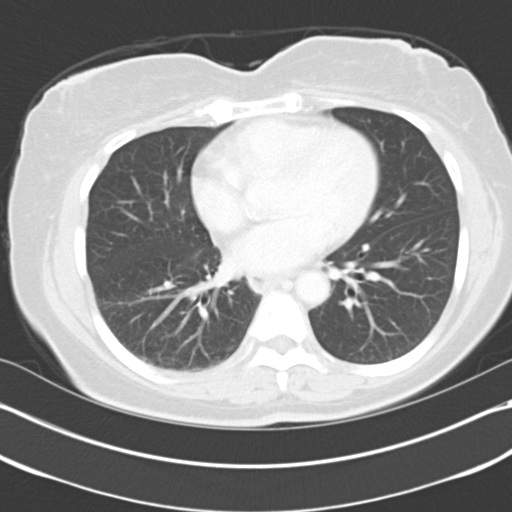
[im 32/58  lung]
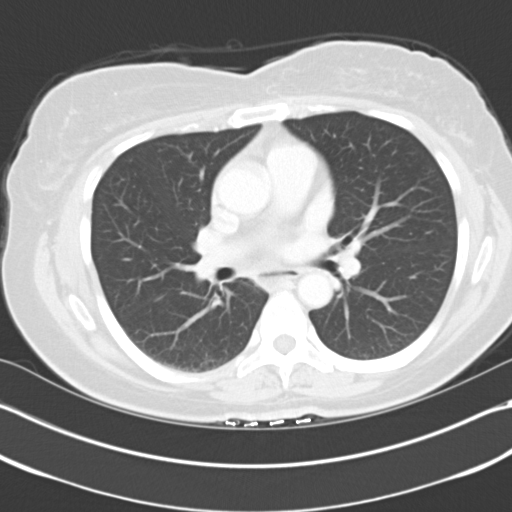
[im 36/58  lung]
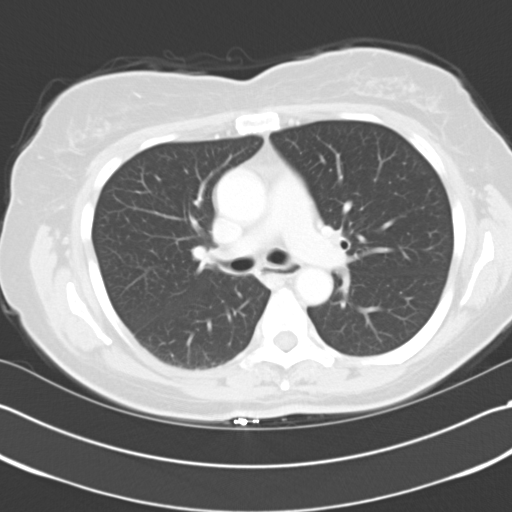
[im 41/58  mediastinal]
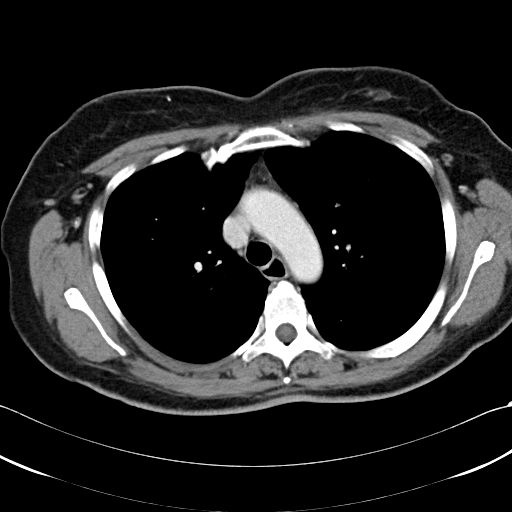
[im 41/58  lung]
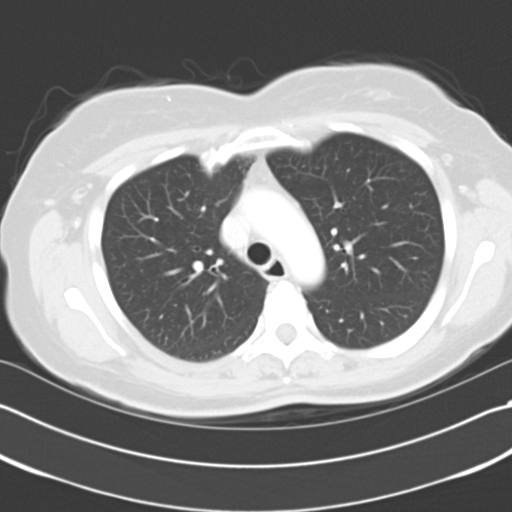
[im 45/58  lung]
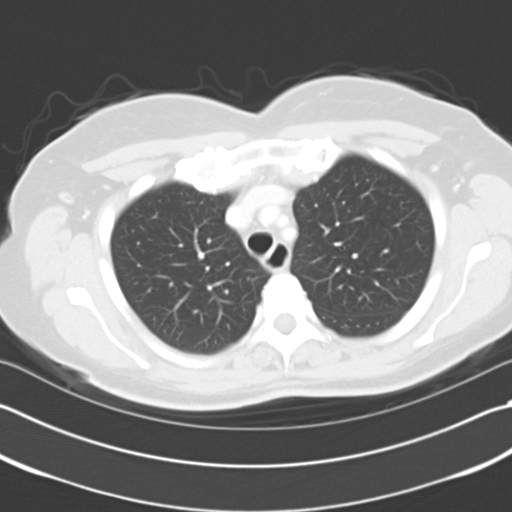
[im 49/58  lung]
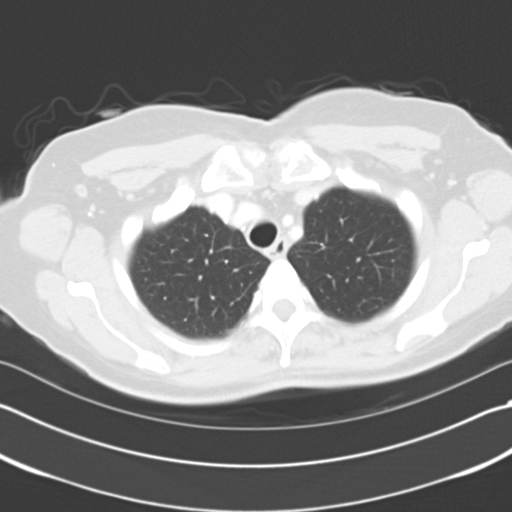
[im 53/58  lung]
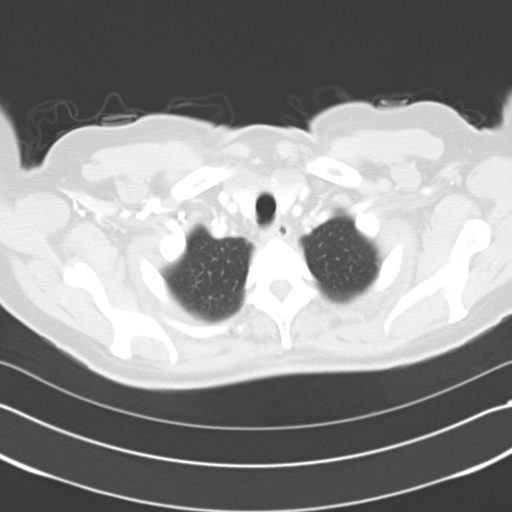

[Series 602: <mpr thick range> · coronal · 0.67mm/px · 3 of 83 slices shown]
[im 17/83  lung]
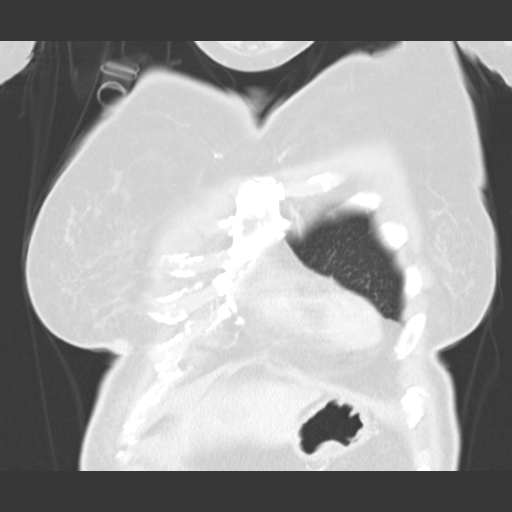
[im 33/83  lung]
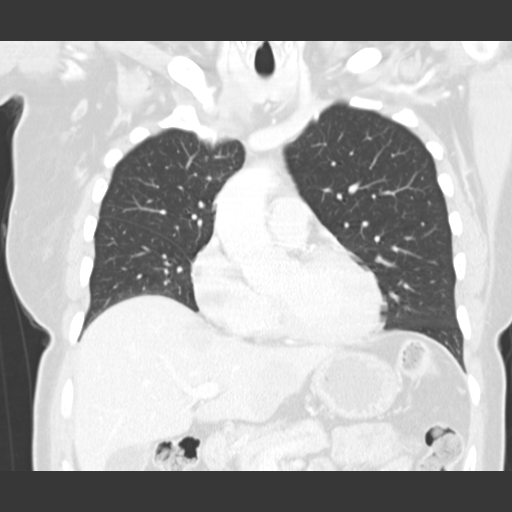
[im 50/83  lung]
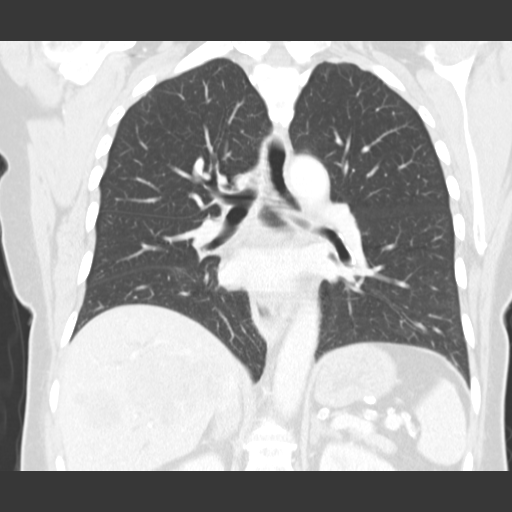

[15 of 36 positions shown; findings below may reference images not displayed]

FINDINGS: Mediastinum / Lymph Nodes: No axillary lymphadenopathy. No
mediastinal or hilar lymphadenopathy. There is some fluid in the
distal esophagus which may be related to reflux or dysmotility.
Heart size is normal. Coronary artery calcification is noted. No
pericardial effusion.

Lungs / Pleura: Lungs are clear without pulmonary nodule or mass. No
focal airspace consolidation. No pleural effusion. There is some
minimal dependent atelectasis in the lower lobes bilaterally.

[HOSPITAL] / Soft Tissues: Bone windows reveal no worrisome lytic or
sclerotic osseous lesions.

Upper Abdomen: Imaging of the upper liver again shows bulky
metastatic disease within the liver parenchyma. Liver has been
incompletely visualized. Two lesions in the anterior hepatic dome or
stable. The more anterior measures 2.3 cm today compared to 2.3 cm
previously. The more posterior of the 2 is 19 mm today compared to
20 mm previously. 21 mm calcified gallstone is evident.
IMPRESSION: 1. No evidence for metastatic disease in the chest.
2. Bulky metastatic disease in the liver as seen on the previous
abdomen and pelvis CT scan. Visualized hepatic lesions show no
substantial interval change.

## 2016-04-18 ENCOUNTER — Ambulatory Visit (HOSPITAL_BASED_OUTPATIENT_CLINIC_OR_DEPARTMENT_OTHER): Payer: BLUE CROSS/BLUE SHIELD

## 2016-04-18 VITALS — BP 156/72 | HR 86 | Temp 98.1°F | Resp 17

## 2016-04-18 DIAGNOSIS — Z452 Encounter for adjustment and management of vascular access device: Secondary | ICD-10-CM

## 2016-04-18 DIAGNOSIS — C187 Malignant neoplasm of sigmoid colon: Secondary | ICD-10-CM

## 2016-04-18 DIAGNOSIS — C787 Secondary malignant neoplasm of liver and intrahepatic bile duct: Principal | ICD-10-CM

## 2016-04-18 DIAGNOSIS — C189 Malignant neoplasm of colon, unspecified: Secondary | ICD-10-CM

## 2016-04-18 MED ORDER — SODIUM CHLORIDE 0.9% FLUSH
10.0000 mL | INTRAVENOUS | Status: DC | PRN
  Administered 2016-04-18: 10 mL
  Filled 2016-04-18: qty 10

## 2016-04-18 MED ORDER — HEPARIN SOD (PORK) LOCK FLUSH 100 UNIT/ML IV SOLN
500.0000 [IU] | Freq: Once | INTRAVENOUS | Status: AC | PRN
  Administered 2016-04-18: 500 [IU]
  Filled 2016-04-18: qty 5

## 2016-04-18 NOTE — Patient Instructions (Signed)
Implanted Port Home Guide An implanted port is a type of central line that is placed under the skin. Central lines are used to provide IV access when treatment or nutrition needs to be given through a person's veins. Implanted ports are used for long-term IV access. An implanted port may be placed because:  You need IV medicine that would be irritating to the small veins in your hands or arms.  You need long-term IV medicines, such as antibiotics.  You need IV nutrition for a long period.  You need frequent blood draws for lab tests.  You need dialysis.  Implanted ports are usually placed in the chest area, but they can also be placed in the upper arm, the abdomen, or the leg. An implanted port has two main parts:  Reservoir. The reservoir is round and will appear as a small, raised area under your skin. The reservoir is the part where a needle is inserted to give medicines or draw blood.  Catheter. The catheter is a thin, flexible tube that extends from the reservoir. The catheter is placed into a large vein. Medicine that is inserted into the reservoir goes into the catheter and then into the vein.  How will I care for my incision site? Do not get the incision site wet. Bathe or shower as directed by your health care provider. How is my port accessed? Special steps must be taken to access the port:  Before the port is accessed, a numbing cream can be placed on the skin. This helps numb the skin over the port site.  Your health care provider uses a sterile technique to access the port. ? Your health care provider must put on a mask and sterile gloves. ? The skin over your port is cleaned carefully with an antiseptic and allowed to dry. ? The port is gently pinched between sterile gloves, and a needle is inserted into the port.  Only "non-coring" port needles should be used to access the port. Once the port is accessed, a blood return should be checked. This helps ensure that the port  is in the vein and is not clogged.  If your port needs to remain accessed for a constant infusion, a clear (transparent) bandage will be placed over the needle site. The bandage and needle will need to be changed every week, or as directed by your health care provider.  Keep the bandage covering the needle clean and dry. Do not get it wet. Follow your health care provider's instructions on how to take a shower or bath while the port is accessed.  If your port does not need to stay accessed, no bandage is needed over the port.  What is flushing? Flushing helps keep the port from getting clogged. Follow your health care provider's instructions on how and when to flush the port. Ports are usually flushed with saline solution or a medicine called heparin. The need for flushing will depend on how the port is used.  If the port is used for intermittent medicines or blood draws, the port will need to be flushed: ? After medicines have been given. ? After blood has been drawn. ? As part of routine maintenance.  If a constant infusion is running, the port may not need to be flushed.  How long will my port stay implanted? The port can stay in for as long as your health care provider thinks it is needed. When it is time for the port to come out, surgery will be   done to remove it. The procedure is similar to the one performed when the port was put in. When should I seek immediate medical care? When you have an implanted port, you should seek immediate medical care if:  You notice a bad smell coming from the incision site.  You have swelling, redness, or drainage at the incision site.  You have more swelling or pain at the port site or the surrounding area.  You have a fever that is not controlled with medicine.  This information is not intended to replace advice given to you by your health care provider. Make sure you discuss any questions you have with your health care provider. Document  Released: 01/06/2005 Document Revised: 06/14/2015 Document Reviewed: 09/13/2012 Elsevier Interactive Patient Education  2017 Elsevier Inc.  

## 2016-04-29 NOTE — Progress Notes (Signed)
[facility]  [phone] [phone]) [phone]  Clinic Follow Up Note   Patient Care Team: Dione Housekeeper, MD as PCP - General (Family Medicine) Tania Ade, RN as Registered Nurse (Oncology) 04/30/2016   CHIEF COMPLAINTS:  Follow-up of metastatic colon cancer  Oncology History   Metastatic colon cancer to liver   Staging form: Colon and Rectum, AJCC 7th Edition     Clinical: Stage Unknown ([place], NX, M1) - Unsigned        Metastatic colon cancer to liver ([place])   07/14/2014 Imaging    CT abdomen and pelvis with contrast showed a 6 cm segment of sigmoid bowel wall thickening with adjacent 14 x 12 mm nodules, diffuse hepatic metastasis. No bowel obstruction.      07/14/2014 Initial Diagnosis    Metastatic colon cancer to liver      07/25/2014 Procedure    Colonoscopy showed a near circumferential medial mass in the sigmoid colon, partially obstructing consistent with carcinoma.      07/25/2014 Initial Biopsy    Sigmoid: Biopsy showed invasive adenocarcinoma arising in a background of high-grade dysplasia.      07/28/2014 Imaging    CT chest showed no evidence of metastasis      08/09/2014 - 01/16/2016 Chemotherapy    mFOLFOX6 every 2 weeks, dose reduction from cycle 7 due to cytopenia, Avastin added from cycle 3, stopped due to disease progression       08/11/2014 - 08/15/2014 Hospital Admission    Pt was admitted for fever and nausea, treated for UTI, liver biopsy cancelled due to the infection       10/29/2015 Imaging    CT chest, abdomen and pelvis with contrast 10/29/2015 IMPRESSION: 1. New/enlarging pulmonary nodules, most notably the 8 by 6 mm left lower lobe pulmonary nodule, favoring metastatic disease. 2. The liver lesions are subjectively grossly similar in size, difficult to measure due to the lack of IV contrast causing poor boundaries. 3. There is some faint heterogeneity of bony density in  the sternum, but this is not significantly changed from 07/28/2014 and is probably incidental given the lack of other evidence of osseous metastatic disease. 4. Lower lumbar spondylosis and degenerative disc disease causing impingement. 5. Coronary, aortic arch, and branch vessel atherosclerotic vascular disease. Aortoiliac atherosclerotic vascular disease. 6. Mildly dilated esophagus with air-fluid level, probably reflecting dysmotility. 7. Cholelithiasis. 8. Thick wall urinary bladder, cystitis not excluded.      01/28/2016 Progression    Restaging CT showed disease progression in lungs, and probably progression in liver mets also      02/14/2016 PET scan    IMPRESSION: 1. Examination is positive for hypermetabolic tumor within both lobes of liver. Index measurements provided above. 2. There are scattered pulmonary nodules within both lungs, most of these are too small to reliably characterize by PET-CT. In the left lower lobe there is a lung nodule which exhibits malignant range FDG uptake measuring 9 mm. 3. Aortic atherosclerosis and coronary artery calcifications.      02/20/2016 -  Chemotherapy    Second line chemo FOLFIRI and Avastin, every 2 weeks       03/28/2016 Imaging    CT Abdomen pelvis without contrast 03/28/2016 IMPRESSION: 1. Diffuse circumferential wall thickening throughout the colon, suggesting acute colitis. Intraluminal fluid density compatible with associated diarrheal illness. 2. Superimposed nodular area of bowel wall thickening within the sigmoid colon suspected to be related to patient's known primary adenocarcinoma. 3. Poor visualization of known hepatic  metastases. No other evidence for metastatic disease within the abdomen and pelvis. Previously identified pulmonary nodules within the left lower and right middle lobes have slightly increased in size, concerning for slightly progressive metastatic disease. 4. Cholelithiasis. 5. Diffuse  atherosclerosis with 3 vessel coronary artery calcifications.       HISTORY OF PRESENTING ILLNESS:  Michelle Erickson 61 y.o. female is here because of abnoaml CT findings which is highly suspecious for metastatic colon cancer.   She presented intermittent nausea, anorexia and abdominal pain since Nov 2015, she has mild pain in the LUQ, crapy pain, positional (bending over),   it happened once every 2-3 weeks, and it has been more frequent in the past month. She has normal BM, but did notice the caliber of the stool is smaller than before. She denies any melena or hematochezia. She lost about 13 lbs in the past 6 months.   She presents to local emergency room in November 2015, and May 2016, was felt to be related to gastric virus. Due to the worsening symptoms lately, she went [facility] ED on 07/13/2014, CT abdomen was obtained which showed multiple large liver metastasis and probable sigmoid colon mass. She was referred to [place] for further workup. She is scheduled to see GI Dr. Olevia Perches this afternoon.  CURRENT THERAPY: second line FOLFIRI and Avastin every 2 weeks, started 02/20/2016, hold Avastin if urine protein>=300  INTERIM HISTORY Michelle Erickson returns for follow-up and cycle 5 chemo. She presents with her husband. States she is well and feels good. Able to carry normal activities. Bowels are back to normal. Taking her oral iron pills. She is eating normally. Denies tingling or numbness of her extremities. She reports needing a refill of blood pressure medication, but not sure of the name.  MEDICAL HISTORY Past Medical History:  Diagnosis Date  . Back pain   . Diabetes mellitus without complication ([place])   . Family history of colon cancer   . Hyperlipemia   . Hypertension   . Metastatic colon cancer to liver ([place]) 07/21/2014  . Snoring   . Wears glasses    contacts/glasses    SURGICAL HISTORY: Past Surgical History:  Procedure Laterality Date  . CATARACT EXTRACTION Bilateral   .  MOUTH SURGERY    . PORTACATH PLACEMENT N/A 08/03/2014   Procedure: INSERTION PORT-A-CATH;  Surgeon: Stark Klein, MD;  Location: [facility];  Service: General;  Laterality: N/A;    SOCIAL HISTORY: Social History   Social History  . Marital status: Married    Spouse name: Charlotte Crumb  . Number of children: 2  . Years of education: N/A   Occupational History  . Not on file.   Social History Main Topics  . Smoking status: Former Smoker    Packs/day: 1.00    Years: 6.00    Types: Cigarettes    Quit date: 01/21/1976  . Smokeless tobacco: Never Used  . Alcohol use No  . Drug use: No  . Sexual activity: Yes    Birth control/ protection: Post-menopausal     Comment: # 2 pregnancies and #2 live births   Other Topics Concern  . Not on file   Social History Narrative   Married, husband [name]   Has #2 adult children   Previously worked at [employer] HISTORY: Family History  Problem Relation Age of Onset  . COPD Father   . Heart attack Father   . Arthritis Sister   . Diabetes  Sister   . Colon cancer Sister     dx over 64  . Colon polyps Sister   . Seizures Brother   . Colon cancer Sister     dx in her early 38s, died in her early to mid 26s  . Diabetes Mother     ALLERGIES:  has No Known Allergies.  MEDICATIONS:  Current Outpatient Prescriptions  Medication Sig Dispense Refill  . cetirizine (ZYRTEC) 10 MG tablet Take 10 mg by mouth daily as needed for allergies. Reported on 05/02/2015    . diphenoxylate-atropine (LOMOTIL) 2.5-0.025 MG tablet Take 1-8 tablets by mouth 4 (four) times daily as needed for diarrhea or loose stools. 60 tablet 1  . ferrous sulfate 325 (65 FE) MG tablet Take 325 mg by mouth daily with breakfast.    . Insulin Glargine (LANTUS SOLOSTAR) 100 UNIT/ML Solostar Pen Inject 20 Units into the skin daily at 10 pm.     . insulin lispro (HUMALOG) 100 UNIT/ML injection Inject 4-8 Units into the skin 2 (two) times daily as needed for high blood  sugar. Sliding scale    . lidocaine-prilocaine (EMLA) cream Apply 1 application topically once.     . ondansetron (ZOFRAN-ODT) 8 MG disintegrating tablet Take 8 mg by mouth every 8 (eight) hours as needed for nausea or vomiting. Reported on 05/02/2015     No current facility-administered medications for this visit.     REVIEW OF SYSTEMS:   Constitutional: Denies fevers, chills or abnormal night sweats. Eyes: Denies blurriness of vision, double vision or watery eyes Ears, nose, mouth, throat, and face: Denies mucositis or sore throat Respiratory: Denies cough, dyspnea or wheezes Cardiovascular: Denies palpitation, chest discomfort Gastrointestinal:  Denies nausea, heartburn Skin: Denies abnormal skin rashes Lymphatics: Denies new lymphadenopathy or easy bruising Neurological:Denies numbness, tingling or new weaknesses Behavioral/Psych: Mood is stable, no new changes  All other systems were reviewed with the patient and are negative.  PHYSICAL EXAMINATION: ECOG PERFORMANCE STATUS: 0  BP 138/70 (BP Location: Right Arm, Patient Position: Sitting)   Pulse 61   Temp 97.5 F (36.4 C) (Oral)   Resp 18   Ht 5\' 4"  (1.626 m)   Wt 152 lb 1.6 oz (69 kg)   SpO2 99%   BMI 26.11 kg/m    GENERAL:alert, no distress and comfortable. SKIN: skin color, texture, turgor are normal, no rashes or significant lesions EYES: normal, conjunctiva are pink and non-injected, sclera clear OROPHARYNX:no exudate, no erythema and lips, buccal mucosa, and tongue normal  NECK: supple, thyroid normal size, non-tender, without nodularity LYMPH:  no palpable lymphadenopathy in the cervical, axillary or inguinal LUNGS: clear to auscultation and percussion with normal breathing effort HEART: regular rate & rhythm and no murmurs and no lower extremity edema ABDOMEN:abdomen soft, non-tender and normal bowel sounds Musculoskeletal:no cyanosis of digits and no clubbing  PSYCH: alert & oriented x 3 with fluent  speech NEURO: no focal motor/sensory deficits  LABORATORY DATA:  I have reviewed the data as listed CBC Latest Ref Rng & Units 04/30/2016 04/16/2016 03/28/2016  WBC 3.9 - 10.3 10e3/uL 4.4 5.2 1.7(L)  Hemoglobin 11.6 - 15.9 g/dL 05/28/2016) 8.3(L) 8.4(L)  Hematocrit 34.8 - 46.6 % 25.7(L) 26.0(L) 25.0(L)  Platelets 145 - 400 10e3/uL 128(L) 141(L) 136(L)   CMP Latest Ref Rng & Units 04/30/2016 04/16/2016 03/28/2016  Glucose 70 - 140 mg/dl 05/28/2016) 802(M 721)  BUN 7.0 - 26.0 mg/dL 013(Y 57.8 42.9)  Creatinine 0.6 - 1.1 mg/dL 27(T) 2.6(C) 4.2(O)  Sodium 136 -  145 mEq/L 139 140 136  Potassium 3.5 - 5.1 mEq/L 4.8 4.3 3.8  Chloride 101 - 111 mmol/L - - 104  CO2 22 - 29 mEq/L '22 27 24  '$ Calcium 8.4 - 10.4 mg/dL 9.3 9.2 9.2  Total Protein 6.4 - 8.3 g/dL 6.7 6.3(L) 7.1  Total Bilirubin 0.20 - 1.20 mg/dL 0.25 0.29 0.8  Alkaline Phos 40 - 150 U/L 158(H) 115 107  AST 5 - 34 U/L 31 42(H) 33  ALT 0 - 55 U/L 34 26 26   CEA (0-5ng/ml) 07/21/2014: 594.5 11/23/2014: 18.2 03/07/2015: 6.6 09/19/2015: 37.9 10/29/2015: 63.7 11/28/2015: 28.99 01/02/2016: 40.81 01/28/2015: 47 03/05/16: 62.66 04/16/16: 92.49  PATHOLOGY REPORTS: Diagnosis 07/25/2014  Surgical [P], sigmoid - INVASIVE ADENOCARCINOMA ARISING IN A BACKGROUND OF HIGH GRADE DYSPLASIA. - SEE COMMENT.  Diagnosis 09/05/2014 Liver, biopsy - METASTATIC ADENOCARCINOMA, SEE COMMENT.   RADIOGRAPHIC STUDIES: I have personally reviewed the radiological images as listed and agreed with the findings in the report.  CT Abdomen pelvis without contrast 03/28/2016 IMPRESSION: 1. Diffuse circumferential wall thickening throughout the colon, suggesting acute colitis. Intraluminal fluid density compatible with associated diarrheal illness. 2. Superimposed nodular area of bowel wall thickening within the sigmoid colon suspected to be related to patient's known primary adenocarcinoma. 3. Poor visualization of known hepatic metastases. No other evidence for metastatic  disease within the abdomen and pelvis. Previously identified pulmonary nodules within the left lower and right middle lobes have slightly increased in size, concerning for slightly progressive metastatic disease. 4. Cholelithiasis. 5. Diffuse atherosclerosis with 3 vessel coronary artery calcifications.  PET 02/14/2016 IMPRESSION: 1. Examination is positive for hypermetabolic tumor within both lobes of liver. Index measurements provided above. 2. There are scattered pulmonary nodules within both lungs, most of these are too small to reliably characterize by PET-CT. In the left lower lobe there is a lung nodule which exhibits malignant range FDG uptake measuring 9 mm. 3. Aortic atherosclerosis and coronary artery calcifications.  Colonoscopy 07/25/2014 Dr. Olevia Perches  ENDOSCOPIC IMPRESSION: Near circumferential medium mass was found in the sigmoid colon; multiple biopsies were performed using cold forceps, partially obstructing mass consistent with carcinoma. Placement of endoclips and tattoo at the margins of the mass which extends from 18-25 cm from the anus  ASSESSMENT & PLAN: 61 y.o. African-American female, presented with intermittent nausea vomiting and anemia.   1. 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 -The natural history of metastatic colon cancer and treatment options were discussed with patient and her husband. Giving the diffuse metastasis in the liver, this is unfortunately incurable disease. I recommend systemic chemotherapy to control her metastatic disease, the goal of therapy is palliative and to prolong her life. -I previously reviewed her restaging CT scan from  01/28/2016, which showed slight disease progression in lungs, especailly the LLL lung nodule. Due to lack of CT contrast, it is difficult to evaluate the metastasis in the liver, but it appears to be progression also. -I previously discussed her PET scan findings, which showed hypermetabolic multiple liver metastasis and a few lung metastasis.  -she is on second line chemo FOLFIRI and Avastin, tolerating well overall  -lab reviewed, adequate for treatment, but urine protein is 300, we'll proceed cycle 5 chemotherapy TODAY but with  no Avastin -Due to her severe diarrhea from cycle 3 chemotherapy, I reduced Irinotecan dose slightly, she is tolerating much better  -Plan to repeat a staging PET scan in 2 weeks. 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 -Her ferritin is normal, serum iron and saturation slightly low, TIBC low normal, I suggest her to take oral iron supplement, she tolerates well, we'll continue once daily, with orange juice or vitamin C. -Iron 03/05/2016 was 64. -Her anemia has been stable overall, hemoglobin 8.4 TODAY, stable overall, she is asymptomatic, we'll continue observation.  3. DM, HTN -Continue follow-up with PCP - we previously discussed the potential impact of chemotherapy and premedication dexamethasone on her blood pressure and sugar,will  Monitor closely. Her BG has been well controlled lately  -Her blood pressure has been high lately, we'll repeat in the infusion room. I encouraged her to measure her blood pressure at home. We discussed Avastin can increase her blood pressure, need to be monitored closely. If SBP persistently high than 150, I'll consider adding on a new antihypertensive medication. She will follow-up with her primary  care physician also.  4. 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 Will continue monitoring renal function  -I encourage her to drink more fluids -We'll avoid NSAIDs and IV contrast for CT scan in the future. -Avastin was held previously, however proteinuria was improved and we restarted Avastin. -Urine protein by dipstick is 300 on 04/30/16. Will hold Avastin TODAY.  5. Goal of care discussion  -We previously discussed the incurable nature of her cancer, and the overall poor prognosis, especially if she does not have good response to chemotherapy or progress on chemo -The patient understands the goal of care is palliative. -I recommended DNR/DNI, she will think about it.  6. Diarrhea secondary to chemotherapy -much better after dose reduction of irinotecan  -I encouraged the patient to maintain hydration. -I prescribed lomotil on 04/16/16, she has not used   Plan -Proceed with cycle 5 today. Continue Irinotecan dose reduction to '160mg'$ /m2 due to her history of severe diarrhea  -continue Avastin if urine protein<300. Hold Avastin today. -PET in 2 weeks. -Lab, f/u, and chemo Folfiri and avastin in 2 weeks.  All questions were answered. The patient knows to call the clinic with any problems, questions or concerns.  I spent 25 minutes counseling the patient face to face. The total time spent in the appointment was 30 minutes and more than 50% was on counseling. nts to them. This document has been checked and approved by the attending provider.    Truitt Merle, MD 04/30/2016   This document serves as a record of services personally performed by Truitt Merle, MD. It was created on her behalf by Darcus Austin, a trained medical scribe. The creation of this record is based on the scribe's personal observations and the provider's statements to them. This document has been checked and approved by the attending provider.

## 2016-04-30 ENCOUNTER — Other Ambulatory Visit (HOSPITAL_BASED_OUTPATIENT_CLINIC_OR_DEPARTMENT_OTHER): Payer: BLUE CROSS/BLUE SHIELD

## 2016-04-30 ENCOUNTER — Encounter: Payer: Self-pay | Admitting: Hematology

## 2016-04-30 ENCOUNTER — Ambulatory Visit (HOSPITAL_BASED_OUTPATIENT_CLINIC_OR_DEPARTMENT_OTHER): Payer: BLUE CROSS/BLUE SHIELD

## 2016-04-30 ENCOUNTER — Ambulatory Visit (HOSPITAL_BASED_OUTPATIENT_CLINIC_OR_DEPARTMENT_OTHER): Payer: BLUE CROSS/BLUE SHIELD | Admitting: Hematology

## 2016-04-30 VITALS — BP 138/70 | HR 61 | Temp 97.5°F | Resp 18 | Ht 64.0 in | Wt 152.1 lb

## 2016-04-30 DIAGNOSIS — R197 Diarrhea, unspecified: Secondary | ICD-10-CM | POA: Diagnosis not present

## 2016-04-30 DIAGNOSIS — Z452 Encounter for adjustment and management of vascular access device: Secondary | ICD-10-CM

## 2016-04-30 DIAGNOSIS — C7800 Secondary malignant neoplasm of unspecified lung: Secondary | ICD-10-CM

## 2016-04-30 DIAGNOSIS — E119 Type 2 diabetes mellitus without complications: Secondary | ICD-10-CM | POA: Diagnosis not present

## 2016-04-30 DIAGNOSIS — N189 Chronic kidney disease, unspecified: Secondary | ICD-10-CM | POA: Diagnosis not present

## 2016-04-30 DIAGNOSIS — C187 Malignant neoplasm of sigmoid colon: Secondary | ICD-10-CM

## 2016-04-30 DIAGNOSIS — D630 Anemia in neoplastic disease: Secondary | ICD-10-CM | POA: Diagnosis not present

## 2016-04-30 DIAGNOSIS — C787 Secondary malignant neoplasm of liver and intrahepatic bile duct: Principal | ICD-10-CM

## 2016-04-30 DIAGNOSIS — R808 Other proteinuria: Secondary | ICD-10-CM | POA: Diagnosis not present

## 2016-04-30 DIAGNOSIS — C189 Malignant neoplasm of colon, unspecified: Secondary | ICD-10-CM

## 2016-04-30 DIAGNOSIS — I10 Essential (primary) hypertension: Secondary | ICD-10-CM | POA: Diagnosis not present

## 2016-04-30 DIAGNOSIS — Z95828 Presence of other vascular implants and grafts: Secondary | ICD-10-CM

## 2016-04-30 DIAGNOSIS — Z5111 Encounter for antineoplastic chemotherapy: Secondary | ICD-10-CM

## 2016-04-30 LAB — CBC WITH DIFFERENTIAL/PLATELET
BASO%: 0.8 % (ref 0.0–2.0)
Basophils Absolute: 0 10*3/uL (ref 0.0–0.1)
EOS%: 7 % (ref 0.0–7.0)
Eosinophils Absolute: 0.3 10*3/uL (ref 0.0–0.5)
HCT: 25.7 % — ABNORMAL LOW (ref 34.8–46.6)
HGB: 8.4 g/dL — ABNORMAL LOW (ref 11.6–15.9)
LYMPH%: 19.4 % (ref 14.0–49.7)
MCH: 30.4 pg (ref 25.1–34.0)
MCHC: 32.6 g/dL (ref 31.5–36.0)
MCV: 93.1 fL (ref 79.5–101.0)
MONO#: 0.6 10*3/uL (ref 0.1–0.9)
MONO%: 13.5 % (ref 0.0–14.0)
NEUT#: 2.6 10*3/uL (ref 1.5–6.5)
NEUT%: 59.3 % (ref 38.4–76.8)
Platelets: 128 10*3/uL — ABNORMAL LOW (ref 145–400)
RBC: 2.77 10*6/uL — ABNORMAL LOW (ref 3.70–5.45)
RDW: 16.7 % — ABNORMAL HIGH (ref 11.2–14.5)
WBC: 4.4 10*3/uL (ref 3.9–10.3)
lymph#: 0.8 10*3/uL — ABNORMAL LOW (ref 0.9–3.3)

## 2016-04-30 LAB — IRON AND TIBC
%SAT: 19 % — ABNORMAL LOW (ref 21–57)
IRON: 53 ug/dL (ref 41–142)
TIBC: 275 ug/dL (ref 236–444)
UIBC: 222 ug/dL (ref 120–384)

## 2016-04-30 LAB — COMPREHENSIVE METABOLIC PANEL
ALT: 34 U/L (ref 0–55)
AST: 31 U/L (ref 5–34)
Albumin: 3.4 g/dL — ABNORMAL LOW (ref 3.5–5.0)
Alkaline Phosphatase: 158 U/L — ABNORMAL HIGH (ref 40–150)
Anion Gap: 9 mEq/L (ref 3–11)
BUN: 23.6 mg/dL (ref 7.0–26.0)
CHLORIDE: 108 meq/L (ref 98–109)
CO2: 22 meq/L (ref 22–29)
CREATININE: 1.4 mg/dL — AB (ref 0.6–1.1)
Calcium: 9.3 mg/dL (ref 8.4–10.4)
EGFR: 46 mL/min/{1.73_m2} — ABNORMAL LOW (ref >90)
GLUCOSE: 204 mg/dL — AB (ref 70–140)
Potassium: 4.8 mEq/L (ref 3.5–5.1)
SODIUM: 139 meq/L (ref 136–145)
Total Bilirubin: 0.25 mg/dL (ref 0.20–1.20)
Total Protein: 6.7 g/dL (ref 6.4–8.3)

## 2016-04-30 LAB — UA PROTEIN, DIPSTICK - CHCC: Protein, ur: 300 mg/dL

## 2016-04-30 LAB — FERRITIN: Ferritin: 237 ng/ml (ref 9–269)

## 2016-04-30 MED ORDER — SODIUM CHLORIDE 0.9 % IV SOLN
2200.0000 mg/m2 | INTRAVENOUS | Status: DC
  Administered 2016-04-30: 3950 mg via INTRAVENOUS
  Filled 2016-04-30: qty 79

## 2016-04-30 MED ORDER — DEXAMETHASONE SODIUM PHOSPHATE 10 MG/ML IJ SOLN
INTRAMUSCULAR | Status: AC
  Filled 2016-04-30: qty 1

## 2016-04-30 MED ORDER — SODIUM CHLORIDE 0.9 % IV SOLN
400.0000 mg/m2 | Freq: Once | INTRAVENOUS | Status: AC
  Administered 2016-04-30: 720 mg via INTRAVENOUS
  Filled 2016-04-30: qty 36

## 2016-04-30 MED ORDER — PALONOSETRON HCL INJECTION 0.25 MG/5ML
INTRAVENOUS | Status: AC
  Filled 2016-04-30: qty 5

## 2016-04-30 MED ORDER — SODIUM CHLORIDE 0.9 % IJ SOLN
10.0000 mL | INTRAMUSCULAR | Status: DC | PRN
  Administered 2016-04-30: 10 mL via INTRAVENOUS
  Filled 2016-04-30: qty 10

## 2016-04-30 MED ORDER — PALONOSETRON HCL INJECTION 0.25 MG/5ML
0.2500 mg | Freq: Once | INTRAVENOUS | Status: AC
  Administered 2016-04-30: 0.25 mg via INTRAVENOUS

## 2016-04-30 MED ORDER — SODIUM CHLORIDE 0.9 % IV SOLN
160.0000 mg/m2 | Freq: Once | INTRAVENOUS | Status: AC
  Administered 2016-04-30: 280 mg via INTRAVENOUS
  Filled 2016-04-30: qty 10

## 2016-04-30 MED ORDER — SODIUM CHLORIDE 0.9 % IV SOLN
Freq: Once | INTRAVENOUS | Status: AC
  Administered 2016-04-30: 10:00:00 via INTRAVENOUS

## 2016-04-30 MED ORDER — DEXAMETHASONE SODIUM PHOSPHATE 10 MG/ML IJ SOLN
10.0000 mg | Freq: Once | INTRAMUSCULAR | Status: AC
  Administered 2016-04-30: 10 mg via INTRAVENOUS

## 2016-04-30 NOTE — Patient Instructions (Signed)
[facility] Discharge Instructions for Patients Receiving Chemotherapy  Today you received the following chemotherapy agents: Irinotecan, Leucovorin, & 5FU  To help prevent nausea and vomiting after your treatment, we encourage you to take your nausea medication as directed.   If you develop nausea and vomiting that is not controlled by your nausea medication, call the clinic.   BELOW ARE SYMPTOMS THAT SHOULD BE REPORTED IMMEDIATELY:  *FEVER GREATER THAN 100.5 F  *CHILLS WITH OR WITHOUT FEVER  NAUSEA AND VOMITING THAT IS NOT CONTROLLED WITH YOUR NAUSEA MEDICATION  *UNUSUAL SHORTNESS OF BREATH  *UNUSUAL BRUISING OR BLEEDING  TENDERNESS IN MOUTH AND THROAT WITH OR WITHOUT PRESENCE OF ULCERS  *URINARY PROBLEMS  *BOWEL PROBLEMS  UNUSUAL RASH Items with * indicate a potential emergency and should be followed up as soon as possible.  Feel free to call the clinic you have any questions or concerns. The clinic phone number is [phone].  Please show the [facility] at check-in to the Emergency Department and triage nurse.

## 2016-04-30 NOTE — Patient Instructions (Signed)
Implanted Port Home Guide An implanted port is a type of central line that is placed under the skin. Central lines are used to provide IV access when treatment or nutrition needs to be given through a person's veins. Implanted ports are used for long-term IV access. An implanted port may be placed because:  You need IV medicine that would be irritating to the small veins in your hands or arms.  You need long-term IV medicines, such as antibiotics.  You need IV nutrition for a long period.  You need frequent blood draws for lab tests.  You need dialysis.  Implanted ports are usually placed in the chest area, but they can also be placed in the upper arm, the abdomen, or the leg. An implanted port has two main parts:  Reservoir. The reservoir is round and will appear as a small, raised area under your skin. The reservoir is the part where a needle is inserted to give medicines or draw blood.  Catheter. The catheter is a thin, flexible tube that extends from the reservoir. The catheter is placed into a large vein. Medicine that is inserted into the reservoir goes into the catheter and then into the vein.  How will I care for my incision site? Do not get the incision site wet. Bathe or shower as directed by your health care provider. How is my port accessed? Special steps must be taken to access the port:  Before the port is accessed, a numbing cream can be placed on the skin. This helps numb the skin over the port site.  Your health care provider uses a sterile technique to access the port. ? Your health care provider must put on a mask and sterile gloves. ? The skin over your port is cleaned carefully with an antiseptic and allowed to dry. ? The port is gently pinched between sterile gloves, and a needle is inserted into the port.  Only "non-coring" port needles should be used to access the port. Once the port is accessed, a blood return should be checked. This helps ensure that the port  is in the vein and is not clogged.  If your port needs to remain accessed for a constant infusion, a clear (transparent) bandage will be placed over the needle site. The bandage and needle will need to be changed every week, or as directed by your health care provider.  Keep the bandage covering the needle clean and dry. Do not get it wet. Follow your health care provider's instructions on how to take a shower or bath while the port is accessed.  If your port does not need to stay accessed, no bandage is needed over the port.  What is flushing? Flushing helps keep the port from getting clogged. Follow your health care provider's instructions on how and when to flush the port. Ports are usually flushed with saline solution or a medicine called heparin. The need for flushing will depend on how the port is used.  If the port is used for intermittent medicines or blood draws, the port will need to be flushed: ? After medicines have been given. ? After blood has been drawn. ? As part of routine maintenance.  If a constant infusion is running, the port may not need to be flushed.  How long will my port stay implanted? The port can stay in for as long as your health care provider thinks it is needed. When it is time for the port to come out, surgery will be   done to remove it. The procedure is similar to the one performed when the port was put in. When should I seek immediate medical care? When you have an implanted port, you should seek immediate medical care if:  You notice a bad smell coming from the incision site.  You have swelling, redness, or drainage at the incision site.  You have more swelling or pain at the port site or the surrounding area.  You have a fever that is not controlled with medicine.  This information is not intended to replace advice given to you by your health care provider. Make sure you discuss any questions you have with your health care provider. Document  Released: 01/06/2005 Document Revised: 06/14/2015 Document Reviewed: 09/13/2012 Elsevier Interactive Patient Education  2017 Elsevier Inc.  

## 2016-05-01 IMAGING — CR DG CHEST 1V PORT
1 series · 1 of 1 positions shown · non-contrast
Comparison: August 03, 2014

CLINICAL DATA: Fever and cough for 24 hours. Colon carcinoma with
liver metastases

EXAM:
PORTABLE CHEST - 1 VIEW

[AP]
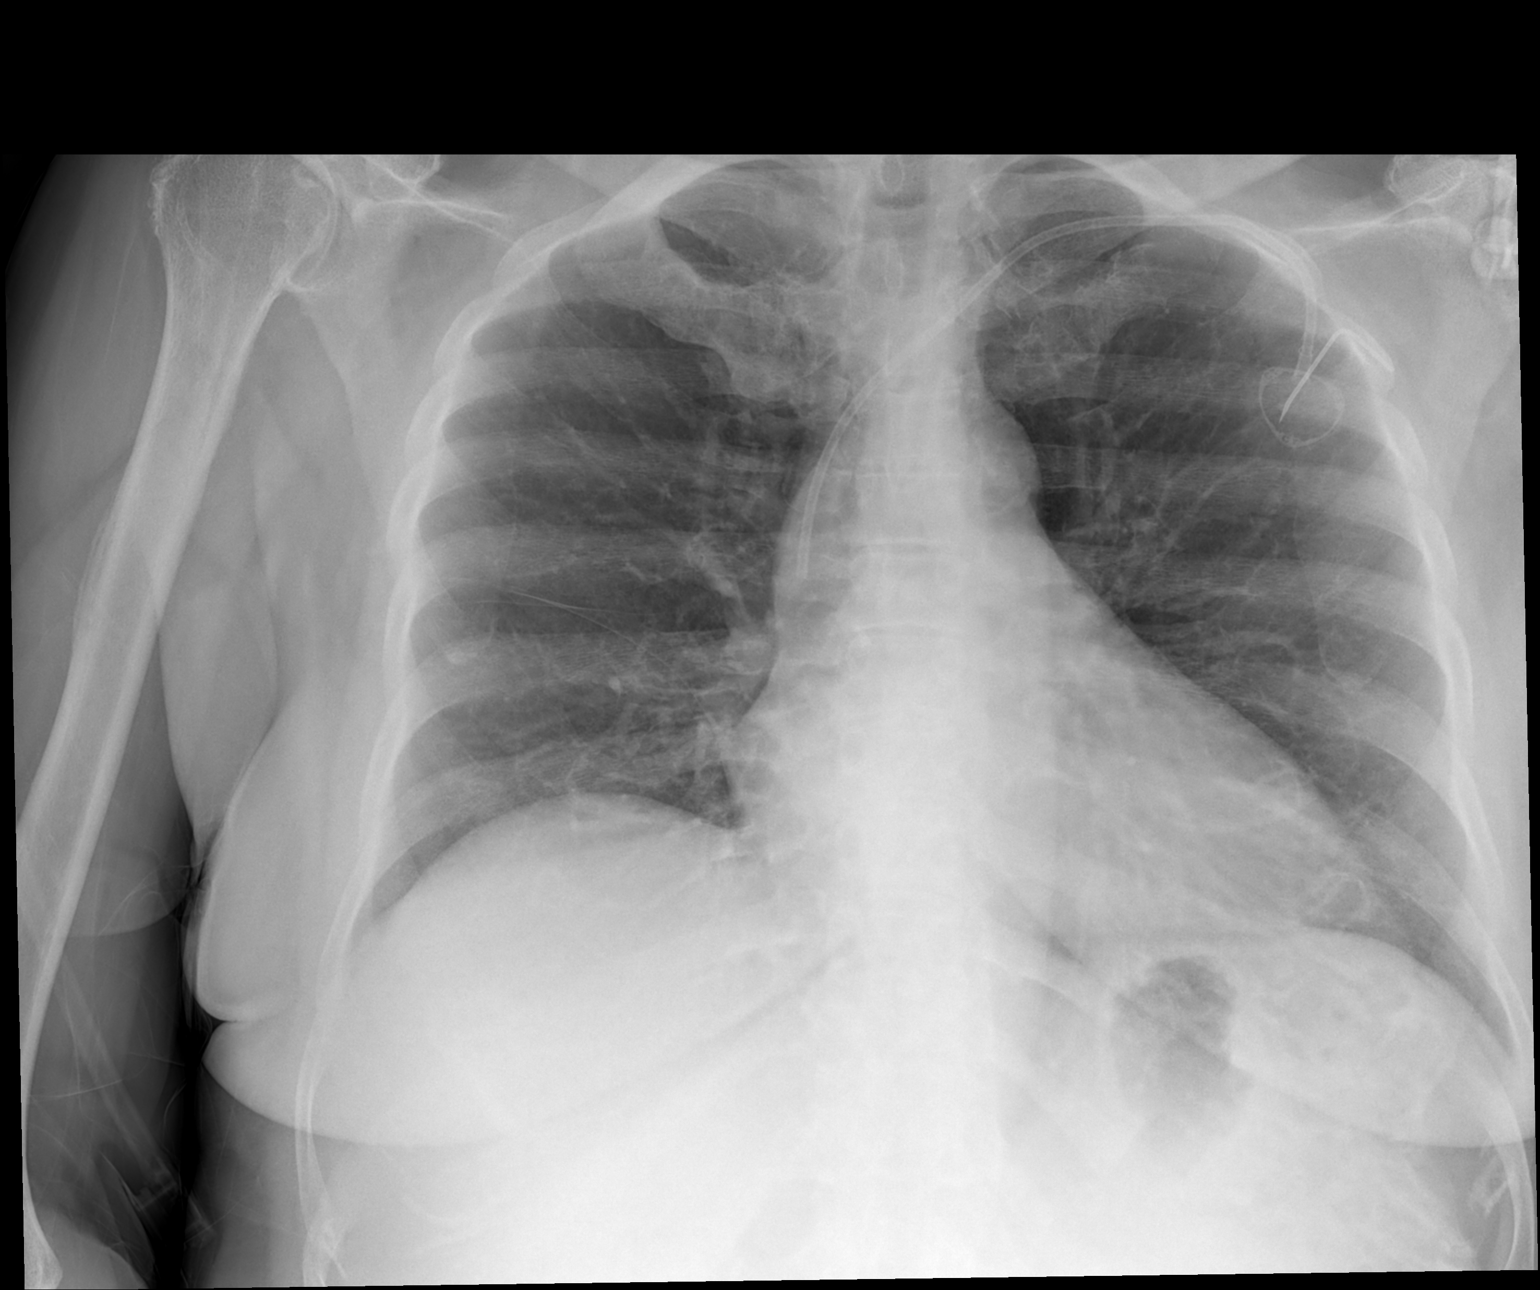

[1 of 1 positions shown; findings below may reference images not displayed]

FINDINGS: Port-A-Cath tip is in the superior vena cava. No pneumothorax. Lungs
are clear. Heart size and pulmonary vascularity are normal. No
adenopathy. No bone lesions.
IMPRESSION: Lungs clear.  No appreciable neoplastic focus.

## 2016-05-02 ENCOUNTER — Ambulatory Visit (HOSPITAL_BASED_OUTPATIENT_CLINIC_OR_DEPARTMENT_OTHER): Payer: BLUE CROSS/BLUE SHIELD

## 2016-05-02 VITALS — BP 136/62 | HR 80 | Temp 97.6°F | Resp 16

## 2016-05-02 DIAGNOSIS — C187 Malignant neoplasm of sigmoid colon: Secondary | ICD-10-CM | POA: Diagnosis not present

## 2016-05-02 DIAGNOSIS — C787 Secondary malignant neoplasm of liver and intrahepatic bile duct: Secondary | ICD-10-CM

## 2016-05-02 DIAGNOSIS — C189 Malignant neoplasm of colon, unspecified: Secondary | ICD-10-CM

## 2016-05-02 MED ORDER — SODIUM CHLORIDE 0.9% FLUSH
10.0000 mL | INTRAVENOUS | Status: DC | PRN
  Administered 2016-05-02: 10 mL
  Filled 2016-05-02: qty 10

## 2016-05-02 MED ORDER — HEPARIN SOD (PORK) LOCK FLUSH 100 UNIT/ML IV SOLN
500.0000 [IU] | Freq: Once | INTRAVENOUS | Status: AC | PRN
  Administered 2016-05-02: 500 [IU]
  Filled 2016-05-02: qty 5

## 2016-05-02 NOTE — Patient Instructions (Signed)
Implanted Port Home Guide An implanted port is a type of central line that is placed under the skin. Central lines are used to provide IV access when treatment or nutrition needs to be given through a person's veins. Implanted ports are used for long-term IV access. An implanted port may be placed because:  You need IV medicine that would be irritating to the small veins in your hands or arms.  You need long-term IV medicines, such as antibiotics.  You need IV nutrition for a long period.  You need frequent blood draws for lab tests.  You need dialysis.  Implanted ports are usually placed in the chest area, but they can also be placed in the upper arm, the abdomen, or the leg. An implanted port has two main parts:  Reservoir. The reservoir is round and will appear as a small, raised area under your skin. The reservoir is the part where a needle is inserted to give medicines or draw blood.  Catheter. The catheter is a thin, flexible tube that extends from the reservoir. The catheter is placed into a large vein. Medicine that is inserted into the reservoir goes into the catheter and then into the vein.  How will I care for my incision site? Do not get the incision site wet. Bathe or shower as directed by your health care provider. How is my port accessed? Special steps must be taken to access the port:  Before the port is accessed, a numbing cream can be placed on the skin. This helps numb the skin over the port site.  Your health care provider uses a sterile technique to access the port. ? Your health care provider must put on a mask and sterile gloves. ? The skin over your port is cleaned carefully with an antiseptic and allowed to dry. ? The port is gently pinched between sterile gloves, and a needle is inserted into the port.  Only "non-coring" port needles should be used to access the port. Once the port is accessed, a blood return should be checked. This helps ensure that the port  is in the vein and is not clogged.  If your port needs to remain accessed for a constant infusion, a clear (transparent) bandage will be placed over the needle site. The bandage and needle will need to be changed every week, or as directed by your health care provider.  Keep the bandage covering the needle clean and dry. Do not get it wet. Follow your health care provider's instructions on how to take a shower or bath while the port is accessed.  If your port does not need to stay accessed, no bandage is needed over the port.  What is flushing? Flushing helps keep the port from getting clogged. Follow your health care provider's instructions on how and when to flush the port. Ports are usually flushed with saline solution or a medicine called heparin. The need for flushing will depend on how the port is used.  If the port is used for intermittent medicines or blood draws, the port will need to be flushed: ? After medicines have been given. ? After blood has been drawn. ? As part of routine maintenance.  If a constant infusion is running, the port may not need to be flushed.  How long will my port stay implanted? The port can stay in for as long as your health care provider thinks it is needed. When it is time for the port to come out, surgery will be   done to remove it. The procedure is similar to the one performed when the port was put in. When should I seek immediate medical care? When you have an implanted port, you should seek immediate medical care if:  You notice a bad smell coming from the incision site.  You have swelling, redness, or drainage at the incision site.  You have more swelling or pain at the port site or the surrounding area.  You have a fever that is not controlled with medicine.  This information is not intended to replace advice given to you by your health care provider. Make sure you discuss any questions you have with your health care provider. Document  Released: 01/06/2005 Document Revised: 06/14/2015 Document Reviewed: 09/13/2012 Elsevier Interactive Patient Education  2017 Elsevier Inc.  

## 2016-05-06 MED ORDER — PALONOSETRON HCL INJECTION 0.25 MG/5ML
INTRAVENOUS | Status: AC
  Filled 2016-05-06: qty 5

## 2016-05-06 MED ORDER — DIPHENHYDRAMINE HCL 25 MG PO CAPS
ORAL_CAPSULE | ORAL | Status: AC
Start: 2016-05-06 — End: 2016-05-06
  Filled 2016-05-06: qty 2

## 2016-05-06 MED ORDER — ACETAMINOPHEN 325 MG PO TABS
ORAL_TABLET | ORAL | Status: AC
  Filled 2016-05-06: qty 2

## 2016-05-09 ENCOUNTER — Encounter: Payer: Self-pay | Admitting: *Deleted

## 2016-05-09 ENCOUNTER — Other Ambulatory Visit: Payer: Self-pay | Admitting: *Deleted

## 2016-05-09 NOTE — Progress Notes (Signed)
Michelle Erickson  [phone] [phone]  Clinic Follow Up Note   Patient Care Team: Dione Housekeeper, MD as PCP - General (Family Medicine) Tania Ade, RN as Registered Nurse (Oncology) 05/14/2016   CHIEF COMPLAINTS:  Follow-up of metastatic colon cancer  Oncology History   Metastatic colon cancer to liver   Staging form: Colon and Rectum, AJCC 7th Edition     Clinical: Stage Unknown ([place], NX, M1) - Unsigned        Metastatic colon cancer to liver ([place])   07/14/2014 Imaging    CT abdomen and pelvis with contrast showed a 6 cm segment of sigmoid bowel wall thickening with adjacent 14 x 12 mm nodules, diffuse hepatic metastasis. No bowel obstruction.      07/14/2014 Initial Diagnosis    Metastatic colon cancer to liver      07/25/2014 Procedure    Colonoscopy showed a near circumferential medial mass in the sigmoid colon, partially obstructing consistent with carcinoma.      07/25/2014 Initial Biopsy    Sigmoid: Biopsy showed invasive adenocarcinoma arising in a background of high-grade dysplasia.      07/28/2014 Imaging    CT chest showed no evidence of metastasis      08/09/2014 - 01/16/2016 Chemotherapy    mFOLFOX6 every 2 weeks, dose reduction from cycle 7 due to cytopenia, Avastin added from cycle 3, stopped due to disease progression       08/11/2014 - 08/15/2014 Hospital Admission    Pt was admitted for fever and nausea, treated for UTI, liver biopsy cancelled due to the infection       10/29/2015 Imaging    CT chest, abdomen and pelvis with contrast 10/29/2015 IMPRESSION: 1. New/enlarging pulmonary nodules, most notably the 8 by 6 mm left lower lobe pulmonary nodule, favoring metastatic disease. 2. The liver lesions are subjectively grossly similar in size, difficult to measure due to the lack of IV contrast causing poor boundaries. 3. There is some faint heterogeneity of bony density in  the sternum, but this is not significantly changed from 07/28/2014 and is probably incidental given the lack of other evidence of osseous metastatic disease. 4. Lower lumbar spondylosis and degenerative disc disease causing impingement. 5. Coronary, aortic arch, and branch vessel atherosclerotic vascular disease. Aortoiliac atherosclerotic vascular disease. 6. Mildly dilated esophagus with air-fluid level, probably reflecting dysmotility. 7. Cholelithiasis. 8. Thick wall urinary bladder, cystitis not excluded.      01/28/2016 Progression    Restaging CT showed disease progression in lungs, and probably progression in liver mets also      02/14/2016 PET scan    IMPRESSION: 1. Examination is positive for hypermetabolic tumor within both lobes of liver. Index measurements provided above. 2. There are scattered pulmonary nodules within both lungs, most of these are too small to reliably characterize by PET-CT. In the left lower lobe there is a lung nodule which exhibits malignant range FDG uptake measuring 9 mm. 3. Aortic atherosclerosis and coronary artery calcifications.      02/20/2016 -  Chemotherapy    Second line chemo FOLFIRI and Avastin, every 2 weeks       03/28/2016 Imaging    CT Abdomen pelvis without contrast 03/28/2016 IMPRESSION: 1. Diffuse circumferential wall thickening throughout the colon, suggesting acute colitis. Intraluminal fluid density compatible with associated diarrheal illness. 2. Superimposed nodular area of bowel wall thickening within the sigmoid colon suspected to be related to patient's known primary adenocarcinoma. 3. Poor visualization of known hepatic  metastases. No other evidence for metastatic disease within the abdomen and pelvis. Previously identified pulmonary nodules within the left lower and right middle lobes have slightly increased in size, concerning for slightly progressive metastatic disease. 4. Cholelithiasis. 5. Diffuse  atherosclerosis with 3 vessel coronary artery calcifications.       HISTORY OF PRESENTING ILLNESS:  Michelle Erickson 61 y.o. female is here because of abnoaml CT findings which is highly suspecious for metastatic colon cancer.   She presented intermittent nausea, anorexia and abdominal pain since Nov 2015, she has mild pain in the LUQ, crapy pain, positional (bending over),   it happened once every 2-3 weeks, and it has been more frequent in the past month. She has normal BM, but did notice the caliber of the stool is smaller than before. She denies any melena or hematochezia. She lost about 13 lbs in the past 6 months.   She presents to local emergency room in November 2015, and May 2016, was felt to be related to gastric virus. Due to the worsening symptoms lately, she went to [facility] ED on 07/13/2014, CT abdomen was obtained which showed multiple large liver metastasis and probable sigmoid colon mass. She was referred to [place] for further workup. She is scheduled to see GI Dr. Olevia Perches this afternoon.  CURRENT THERAPY: second line FOLFIRI and Avastin every 2 weeks, started 02/20/2016, hold Avastin if urine protein>=300  INTERIM HISTORY Michelle Erickson returns for follow-up and cycle 6 chemo. She presents with her husband. PET scan was ordered, but not scheduled. She has not called scheduling. The patient states she is well. She has nausea that is tolerable. She had one episode of emesis. She does not take her nausea medication because it takes too long for it to work. Her nausea is tolerable.  MEDICAL HISTORY Past Medical History:  Diagnosis Date  . Back pain   . Diabetes mellitus without complication ([place])   . Family history of colon cancer   . Hyperlipemia   . Hypertension   . Metastatic colon cancer to liver ([place]) 07/21/2014  . Snoring   . Wears glasses    contacts/glasses    SURGICAL HISTORY: Past Surgical History:  Procedure Laterality Date  . CATARACT EXTRACTION Bilateral   .  MOUTH SURGERY    . PORTACATH PLACEMENT N/A 08/03/2014   Procedure: INSERTION PORT-A-CATH;  Surgeon: Stark Klein, MD;  Location: [place] [facility];  Service: General;  Laterality: N/A;    SOCIAL HISTORY: Social History   Social History  . Marital status: Married    Spouse name: Charlotte Crumb  . Number of children: 2  . Years of education: N/A   Occupational History  . Not on file.   Social History Main Topics  . Smoking status: Former Smoker    Packs/day: 1.00    Years: 6.00    Types: Cigarettes    Quit date: 01/21/1976  . Smokeless tobacco: Never Used  . Alcohol use No  . Drug use: No  . Sexual activity: Yes    Birth control/ protection: Post-menopausal     Comment: # 2 pregnancies and #2 live births   Other Topics Concern  . Not on file   Social History Narrative   Married, husband [name]   Has #2 adult children   Previously worked at [employer] HISTORY: Family History  Problem Relation Age of Onset  . COPD Father   . Heart attack Father   . Arthritis Sister   .  Diabetes Sister   . Colon cancer Sister     dx over 86  . Colon polyps Sister   . Seizures Brother   . Colon cancer Sister     dx in her early 32s, died in her early to mid 69s  . Diabetes Mother     ALLERGIES:  has No Known Allergies.  MEDICATIONS:  Current Outpatient Prescriptions  Medication Sig Dispense Refill  . amLODipine (NORVASC) 10 MG tablet Take 10 mg by mouth daily.    . ferrous sulfate 325 (65 FE) MG tablet Take 325 mg by mouth daily with breakfast.    . Insulin Glargine (LANTUS SOLOSTAR) 100 UNIT/ML Solostar Pen Inject 20 Units into the skin daily at 10 pm.     . insulin lispro (HUMALOG) 100 UNIT/ML injection Inject 4-8 Units into the skin 2 (two) times daily as needed for high blood sugar. Sliding scale    . lidocaine-prilocaine (EMLA) cream Apply 1 application topically once.     . diphenoxylate-atropine (LOMOTIL) 2.5-0.025 MG tablet Take 1-8 tablets by mouth 4 (four) times daily  as needed for diarrhea or loose stools. (Patient not taking: Reported on 05/14/2016) 60 tablet 1  . ondansetron (ZOFRAN-ODT) 8 MG disintegrating tablet Take 8 mg by mouth every 8 (eight) hours as needed for nausea or vomiting. Reported on 05/02/2015     No current facility-administered medications for this visit.    Facility-Administered Medications Ordered in Other Visits  Medication Dose Route Frequency Provider Last Rate Last Dose  . 0.9 %  sodium chloride infusion   Intravenous Once Truitt Merle, MD      . atropine injection 0.5 mg  0.5 mg Intravenous Once PRN Truitt Merle, MD      . fluorouracil (ADRUCIL) 3,950 mg in sodium chloride 0.9 % 71 mL chemo infusion  2,200 mg/m2 (Treatment Plan Recorded) Intravenous 1 day or 1 dose Truitt Merle, MD      . irinotecan (CAMPTOSAR) 280 mg in sodium chloride 0.9 % 500 mL chemo infusion  160 mg/m2 (Treatment Plan Recorded) Intravenous Once Truitt Merle, MD      . leucovorin 720 mg in sodium chloride 0.9 % 250 mL infusion  400 mg/m2 (Treatment Plan Recorded) Intravenous Once Truitt Merle, MD      . sodium chloride 0.9 % injection 10 mL  10 mL Intravenous PRN Truitt Merle, MD   10 mL at 05/14/16 0753    REVIEW OF SYSTEMS:   Constitutional: Denies fevers, chills or abnormal night sweats. Eyes: Denies blurriness of vision, double vision or watery eyes Ears, nose, mouth, throat, and face: Denies mucositis or sore throat Respiratory: Denies cough, dyspnea or wheezes Cardiovascular: Denies palpitation, chest discomfort Gastrointestinal:  Denies heartburn (+) Nausea and 1 episode of emesis Skin: Denies abnormal skin rashes Lymphatics: Denies new lymphadenopathy or easy bruising Neurological:Denies numbness, tingling or new weaknesses Behavioral/Psych: Mood is stable, no new changes  All other systems were reviewed with the patient and are negative.  PHYSICAL EXAMINATION: ECOG PERFORMANCE STATUS: 0  BP (!) 148/63 (BP Location: Left Arm, Patient Position: Sitting)   Pulse 85    Temp 98 F (36.7 C) (Oral)   Resp 17   Ht '5\' 4"'$  (1.626 m)   Wt 154 lb 6.4 oz (70 kg)   SpO2 100%   BMI 26.50 kg/m    GENERAL:alert, no distress and comfortable. SKIN: skin color, texture, turgor are normal, no rashes or significant lesions EYES: normal, conjunctiva are pink and non-injected, sclera clear OROPHARYNX:no exudate,  no erythema and lips, buccal mucosa, and tongue normal  NECK: supple, thyroid normal size, non-tender, without nodularity LYMPH:  no palpable lymphadenopathy in the cervical, axillary or inguinal LUNGS: clear to auscultation and percussion with normal breathing effort HEART: regular rate & rhythm and no murmurs and no lower extremity edema ABDOMEN:abdomen soft, non-tender and normal bowel sounds Musculoskeletal:no cyanosis of digits and no clubbing  PSYCH: alert & oriented x 3 with fluent speech NEURO: no focal motor/sensory deficits  LABORATORY DATA:  I have reviewed the data as listed CBC Latest Ref Rng & Units 05/14/2016 04/30/2016 04/16/2016  WBC 3.9 - 10.3 10e3/uL 4.6 4.4 5.2  Hemoglobin 11.6 - 15.9 g/dL 8.0(L) 8.4(L) 8.3(L)  Hematocrit 34.8 - 46.6 % 25.6(L) 25.7(L) 26.0(L)  Platelets 145 - 400 10e3/uL 134(L) 128(L) 141(L)   CMP Latest Ref Rng & Units 05/14/2016 04/30/2016 04/16/2016  Glucose 70 - 140 mg/dl 227(H) 204(H) 124  BUN 7.0 - 26.0 mg/dL 28.7(H) 23.6 17.9  Creatinine 0.6 - 1.1 mg/dL 1.5(H) 1.4(H) 1.2(H)  Sodium 136 - 145 mEq/L 139 139 140  Potassium 3.5 - 5.1 mEq/L 4.9 4.8 4.3  Chloride 101 - 111 mmol/L - - -  CO2 22 - 29 mEq/L '23 22 27  '$ Calcium 8.4 - 10.4 mg/dL 9.1 9.3 9.2  Total Protein 6.4 - 8.3 g/dL 6.6 6.7 6.3(L)  Total Bilirubin 0.20 - 1.20 mg/dL <0.22 0.25 0.29  Alkaline Phos 40 - 150 U/L 159(H) 158(H) 115  AST 5 - 34 U/L 31 31 42(H)  ALT 0 - 55 U/L 29 34 26   CEA (0-5ng/ml) 07/21/2014: 594.5 11/23/2014: 18.2 03/07/2015: 6.6 09/19/2015: 37.9 10/29/2015: 63.7 11/28/2015: 28.99 01/02/2016: 40.81 01/28/2015: 47 03/05/16: 62.66 04/16/16:  92.49  PATHOLOGY REPORTS: Diagnosis 07/25/2014  Surgical [P], sigmoid - INVASIVE ADENOCARCINOMA ARISING IN A BACKGROUND OF HIGH GRADE DYSPLASIA. - SEE COMMENT.  Diagnosis 09/05/2014 Liver, biopsy - METASTATIC ADENOCARCINOMA, SEE COMMENT.   RADIOGRAPHIC STUDIES: I have personally reviewed the radiological images as listed and agreed with the findings in the report.  CT Abdomen pelvis without contrast 03/28/2016 IMPRESSION: 1. Diffuse circumferential wall thickening throughout the colon, suggesting acute colitis. Intraluminal fluid density compatible with associated diarrheal illness. 2. Superimposed nodular area of bowel wall thickening within the sigmoid colon suspected to be related to patient's known primary adenocarcinoma. 3. Poor visualization of known hepatic metastases. No other evidence for metastatic disease within the abdomen and pelvis. Previously identified pulmonary nodules within the left lower and right middle lobes have slightly increased in size, concerning for slightly progressive metastatic disease. 4. Cholelithiasis. 5. Diffuse atherosclerosis with 3 vessel coronary artery calcifications.  PET 02/14/2016 IMPRESSION: 1. Examination is positive for hypermetabolic tumor within both lobes of liver. Index measurements provided above. 2. There are scattered pulmonary nodules within both lungs, most of these are too small to reliably characterize by PET-CT. In the left lower lobe there is a lung nodule which exhibits malignant range FDG uptake measuring 9 mm. 3. Aortic atherosclerosis and coronary artery calcifications.  Colonoscopy 07/25/2014 Dr. Olevia Perches  ENDOSCOPIC IMPRESSION: Near circumferential medium mass was found in the sigmoid colon; multiple biopsies were performed using cold forceps, partially obstructing mass consistent with carcinoma. Placement of endoclips and tattoo at the margins of the mass which extends from 18-25 cm from the anus  ASSESSMENT &  PLAN: 61 y.o. African-American female, presented with intermittent nausea vomiting and anemia.   1. 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 -The natural history of metastatic colon cancer and treatment options were discussed with patient and her husband. Giving the diffuse metastasis in the liver, this is unfortunately incurable disease. I recommend systemic chemotherapy to control her metastatic disease, the goal of therapy is palliative and to prolong her life. -I previously reviewed her restaging CT scan from 01/28/2016, which showed slight disease progression in lungs, especailly the LLL lung nodule. Due to lack of CT contrast, it is difficult to evaluate the metastasis in the liver, but it appears to be progression also. -I previously discussed her PET scan findings, which showed hypermetabolic multiple liver metastasis and a few lung metastasis.  -she is on second line chemo FOLFIRI and Avastin, tolerating well overall  -Due to her severe diarrhea from cycle 3 chemotherapy, I reduced Irinotecan dose slightly, she is tolerating much better. -On 04/30/16 lab reviewed, adequate for treatment, but urine protein is 300, we proceeded cycle 5 chemotherapy with no Avastin. -Labs reviewed, urine protein by dipstick PENDING, proceed with cycle 6 today, hold Avastin if urine protein at or above 300. -Plan to repeat a staging PET scan before her next visit in 2 weeks. -We'll continue follow-up her CEA tumor marker monthly.  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 -Her ferritin is normal, serum iron and saturation slightly low, TIBC low normal, I suggest her to take oral iron supplement, she tolerates well, we'll continue once daily, with orange juice or vitamin C. -Iron 03/05/2016 was 64. -Her anemia has been stable overall, hemoglobin 8.0 TODAY, stable overall, she is asymptomatic, we'll continue observation.  3. DM, HTN -Continue follow-up with PCP - we previously discussed the potential impact of chemotherapy and premedication dexamethasone on her blood pressure and sugar,will  Monitor closely. Her BG has been well controlled lately  -Her blood pressure has been high lately, we'll repeat in the infusion room. I encouraged her to measure her blood pressure at home. We discussed Avastin can increase her blood pressure, need to be monitored closely. If SBP persistently high than 150, I'll consider adding on a new antihypertensive medication. She will follow-up with her primary care physician also.  4. 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 Will continue monitoring renal function  -I encourage her to drink more fluids -We'll avoid NSAIDs and IV contrast for CT scan in the future. -Avastin was held previously, however proteinuria was improved and we restarted Avastin. -Urine protein by dipstick was 300 on 04/30/16. Held Avastin on 04/30/16.  5. Goal of care discussion  -We previously discussed the incurable nature of her cancer, and the overall poor prognosis, especially if she does not have good response to chemotherapy or progress on chemo -The patient understands the goal of care is palliative. -I recommended DNR/DNI, she will think about it.  6. Nausea -The patient has nausea secondary from chemotherapy. -The patient is not taking her Zofran because she describes it working too late for her. -I advised her to take Zofran prophylactically, or drink ginger  tea.  Plan -Proceed with cycle 6 today. Continue Irinotecan dose reduction to '160mg'$ /m2 due to her history  of severe diarrhea  -continue Avastin if urine protein<300. -PET within the next 2 weeks before her f/u. -Lab, flush, and chemo Folfiri and avastin every 2 weeks X 4. -F/u in 2 and 6 weeks.   All questions were answered. The patient knows to call the clinic with any problems, questions or concerns.  I spent 15 minutes counseling the patient face to face. The total time spent in the appointment was 20 minutes and more than 50% was on counseling. nts to them. This document has been checked and approved by the attending provider.    Truitt Merle, MD 05/14/2016   This document serves as a record of services personally performed by Truitt Merle, MD. It was created on her behalf by Darcus Austin, a trained medical scribe. The creation of this record is based on the scribe's personal observations and the provider's statements to them. This document has been checked and approved by the attending provider.

## 2016-05-14 ENCOUNTER — Ambulatory Visit (HOSPITAL_BASED_OUTPATIENT_CLINIC_OR_DEPARTMENT_OTHER): Payer: BLUE CROSS/BLUE SHIELD | Admitting: Hematology

## 2016-05-14 ENCOUNTER — Encounter: Payer: Self-pay | Admitting: Hematology

## 2016-05-14 ENCOUNTER — Ambulatory Visit (HOSPITAL_BASED_OUTPATIENT_CLINIC_OR_DEPARTMENT_OTHER): Payer: BLUE CROSS/BLUE SHIELD

## 2016-05-14 VITALS — BP 148/63 | HR 85 | Temp 98.0°F | Resp 17 | Ht 64.0 in | Wt 154.4 lb

## 2016-05-14 DIAGNOSIS — C787 Secondary malignant neoplasm of liver and intrahepatic bile duct: Secondary | ICD-10-CM

## 2016-05-14 DIAGNOSIS — Z5112 Encounter for antineoplastic immunotherapy: Secondary | ICD-10-CM

## 2016-05-14 DIAGNOSIS — C187 Malignant neoplasm of sigmoid colon: Secondary | ICD-10-CM

## 2016-05-14 DIAGNOSIS — R110 Nausea: Secondary | ICD-10-CM | POA: Diagnosis not present

## 2016-05-14 DIAGNOSIS — I10 Essential (primary) hypertension: Secondary | ICD-10-CM

## 2016-05-14 DIAGNOSIS — Z95828 Presence of other vascular implants and grafts: Secondary | ICD-10-CM

## 2016-05-14 DIAGNOSIS — C7800 Secondary malignant neoplasm of unspecified lung: Secondary | ICD-10-CM | POA: Diagnosis not present

## 2016-05-14 DIAGNOSIS — Z452 Encounter for adjustment and management of vascular access device: Secondary | ICD-10-CM

## 2016-05-14 DIAGNOSIS — N189 Chronic kidney disease, unspecified: Secondary | ICD-10-CM | POA: Diagnosis not present

## 2016-05-14 DIAGNOSIS — C189 Malignant neoplasm of colon, unspecified: Secondary | ICD-10-CM

## 2016-05-14 DIAGNOSIS — Z5111 Encounter for antineoplastic chemotherapy: Secondary | ICD-10-CM | POA: Diagnosis not present

## 2016-05-14 DIAGNOSIS — D630 Anemia in neoplastic disease: Secondary | ICD-10-CM | POA: Diagnosis not present

## 2016-05-14 DIAGNOSIS — E119 Type 2 diabetes mellitus without complications: Secondary | ICD-10-CM | POA: Diagnosis not present

## 2016-05-14 LAB — CEA (IN HOUSE-CHCC): CEA (CHCC-In House): 98.62 ng/mL — ABNORMAL HIGH (ref 0.00–5.00)

## 2016-05-14 LAB — CBC WITH DIFFERENTIAL/PLATELET
BASO%: 0.4 % (ref 0.0–2.0)
BASOS ABS: 0 10*3/uL (ref 0.0–0.1)
EOS ABS: 0.4 10*3/uL (ref 0.0–0.5)
EOS%: 8.1 % — ABNORMAL HIGH (ref 0.0–7.0)
HCT: 25.6 % — ABNORMAL LOW (ref 34.8–46.6)
HEMOGLOBIN: 8 g/dL — AB (ref 11.6–15.9)
LYMPH%: 21.1 % (ref 14.0–49.7)
MCH: 29.2 pg (ref 25.1–34.0)
MCHC: 31.3 g/dL — ABNORMAL LOW (ref 31.5–36.0)
MCV: 93.4 fL (ref 79.5–101.0)
MONO#: 0.5 10*3/uL (ref 0.1–0.9)
MONO%: 11 % (ref 0.0–14.0)
NEUT#: 2.7 10*3/uL (ref 1.5–6.5)
NEUT%: 59.4 % (ref 38.4–76.8)
Platelets: 134 10*3/uL — ABNORMAL LOW (ref 145–400)
RBC: 2.74 10*6/uL — AB (ref 3.70–5.45)
RDW: 16.8 % — AB (ref 11.2–14.5)
WBC: 4.6 10*3/uL (ref 3.9–10.3)
lymph#: 1 10*3/uL (ref 0.9–3.3)

## 2016-05-14 LAB — COMPREHENSIVE METABOLIC PANEL
ALBUMIN: 3.3 g/dL — AB (ref 3.5–5.0)
ALK PHOS: 159 U/L — AB (ref 40–150)
ALT: 29 U/L (ref 0–55)
ANION GAP: 7 meq/L (ref 3–11)
AST: 31 U/L (ref 5–34)
BUN: 28.7 mg/dL — ABNORMAL HIGH (ref 7.0–26.0)
CO2: 23 mEq/L (ref 22–29)
Calcium: 9.1 mg/dL (ref 8.4–10.4)
Chloride: 109 mEq/L (ref 98–109)
Creatinine: 1.5 mg/dL — ABNORMAL HIGH (ref 0.6–1.1)
EGFR: 41 mL/min/{1.73_m2} — AB (ref >90)
Glucose: 227 mg/dl — ABNORMAL HIGH (ref 70–140)
Potassium: 4.9 mEq/L (ref 3.5–5.1)
SODIUM: 139 meq/L (ref 136–145)
TOTAL PROTEIN: 6.6 g/dL (ref 6.4–8.3)

## 2016-05-14 LAB — UA PROTEIN, DIPSTICK - CHCC: Protein, ur: 100 mg/dL

## 2016-05-14 MED ORDER — ATROPINE SULFATE 1 MG/ML IJ SOLN
INTRAMUSCULAR | Status: AC
  Filled 2016-05-14: qty 1

## 2016-05-14 MED ORDER — PALONOSETRON HCL INJECTION 0.25 MG/5ML
0.2500 mg | Freq: Once | INTRAVENOUS | Status: AC
  Administered 2016-05-14: 0.25 mg via INTRAVENOUS

## 2016-05-14 MED ORDER — PALONOSETRON HCL INJECTION 0.25 MG/5ML
INTRAVENOUS | Status: AC
  Filled 2016-05-14: qty 5

## 2016-05-14 MED ORDER — SODIUM CHLORIDE 0.9 % IV SOLN
Freq: Once | INTRAVENOUS | Status: AC
  Administered 2016-05-14: 10:00:00 via INTRAVENOUS

## 2016-05-14 MED ORDER — SODIUM CHLORIDE 0.9 % IV SOLN
5.0000 mg/kg | Freq: Once | INTRAVENOUS | Status: AC
  Administered 2016-05-14: 350 mg via INTRAVENOUS
  Filled 2016-05-14: qty 14

## 2016-05-14 MED ORDER — DEXAMETHASONE SODIUM PHOSPHATE 10 MG/ML IJ SOLN
INTRAMUSCULAR | Status: AC
  Filled 2016-05-14: qty 1

## 2016-05-14 MED ORDER — SODIUM CHLORIDE 0.9 % IV SOLN
2200.0000 mg/m2 | INTRAVENOUS | Status: DC
  Administered 2016-05-14: 3950 mg via INTRAVENOUS
  Filled 2016-05-14: qty 79

## 2016-05-14 MED ORDER — DEXAMETHASONE SODIUM PHOSPHATE 10 MG/ML IJ SOLN
10.0000 mg | Freq: Once | INTRAMUSCULAR | Status: AC
  Administered 2016-05-14: 10 mg via INTRAVENOUS

## 2016-05-14 MED ORDER — SODIUM CHLORIDE 0.9 % IV SOLN
160.0000 mg/m2 | Freq: Once | INTRAVENOUS | Status: AC
  Administered 2016-05-14: 280 mg via INTRAVENOUS
  Filled 2016-05-14: qty 10

## 2016-05-14 MED ORDER — ATROPINE SULFATE 1 MG/ML IJ SOLN
0.5000 mg | Freq: Once | INTRAMUSCULAR | Status: AC | PRN
  Administered 2016-05-14: 0.5 mg via INTRAVENOUS

## 2016-05-14 MED ORDER — SODIUM CHLORIDE 0.9 % IJ SOLN
10.0000 mL | INTRAMUSCULAR | Status: DC | PRN
  Administered 2016-05-14: 10 mL via INTRAVENOUS
  Filled 2016-05-14: qty 10

## 2016-05-14 MED ORDER — SODIUM CHLORIDE 0.9 % IV SOLN
400.0000 mg/m2 | Freq: Once | INTRAVENOUS | Status: AC
  Administered 2016-05-14: 720 mg via INTRAVENOUS
  Filled 2016-05-14: qty 36

## 2016-05-14 NOTE — Patient Instructions (Signed)

## 2016-05-14 NOTE — Progress Notes (Signed)
Dr. Burr Medico advised ok to treat with creatinine 1.5.

## 2016-05-14 NOTE — Patient Instructions (Signed)
[facility] Discharge Instructions for Patients Receiving Chemotherapy  Today you received the following chemotherapy agents:  Avastin, Irinotecan, Leucovorin, and 5FU.  To help prevent nausea and vomiting after your treatment, we encourage you to take your nausea medication as directed.   If you develop nausea and vomiting that is not controlled by your nausea medication, call the clinic.   BELOW ARE SYMPTOMS THAT SHOULD BE REPORTED IMMEDIATELY:  *FEVER GREATER THAN 100.5 F  *CHILLS WITH OR WITHOUT FEVER  NAUSEA AND VOMITING THAT IS NOT CONTROLLED WITH YOUR NAUSEA MEDICATION  *UNUSUAL SHORTNESS OF BREATH  *UNUSUAL BRUISING OR BLEEDING  TENDERNESS IN MOUTH AND THROAT WITH OR WITHOUT PRESENCE OF ULCERS  *URINARY PROBLEMS  *BOWEL PROBLEMS  UNUSUAL RASH Items with * indicate a potential emergency and should be followed up as soon as possible.  Feel free to call the clinic you have any questions or concerns. The clinic phone number is [phone].  Please show the [facility] at check-in to the Emergency Department and triage nurse.

## 2016-05-16 ENCOUNTER — Ambulatory Visit (HOSPITAL_BASED_OUTPATIENT_CLINIC_OR_DEPARTMENT_OTHER): Payer: BLUE CROSS/BLUE SHIELD

## 2016-05-16 VITALS — BP 134/84 | HR 80 | Temp 97.9°F | Resp 16

## 2016-05-16 DIAGNOSIS — C787 Secondary malignant neoplasm of liver and intrahepatic bile duct: Principal | ICD-10-CM

## 2016-05-16 DIAGNOSIS — C187 Malignant neoplasm of sigmoid colon: Secondary | ICD-10-CM | POA: Diagnosis not present

## 2016-05-16 DIAGNOSIS — Z452 Encounter for adjustment and management of vascular access device: Secondary | ICD-10-CM

## 2016-05-16 DIAGNOSIS — C189 Malignant neoplasm of colon, unspecified: Secondary | ICD-10-CM

## 2016-05-16 MED ORDER — HEPARIN SOD (PORK) LOCK FLUSH 100 UNIT/ML IV SOLN
500.0000 [IU] | Freq: Once | INTRAVENOUS | Status: AC | PRN
Start: 2016-05-16 — End: 2016-05-16
  Administered 2016-05-16: 500 [IU]
  Filled 2016-05-16: qty 5

## 2016-05-16 MED ORDER — SODIUM CHLORIDE 0.9% FLUSH
10.0000 mL | INTRAVENOUS | Status: DC | PRN
  Administered 2016-05-16: 10 mL
  Filled 2016-05-16: qty 10

## 2016-05-26 ENCOUNTER — Ambulatory Visit (HOSPITAL_COMMUNITY)
Admission: RE | Admit: 2016-05-26 | Discharge: 2016-05-26 | Disposition: A | Discharge status: Home / Self Care | Payer: BLUE CROSS/BLUE SHIELD | Source: Ambulatory Visit | Attending: Hematology | Admitting: Hematology

## 2016-05-26 DIAGNOSIS — R918 Other nonspecific abnormal finding of lung field: Secondary | ICD-10-CM | POA: Diagnosis not present

## 2016-05-26 DIAGNOSIS — I700 Atherosclerosis of aorta: Secondary | ICD-10-CM | POA: Diagnosis not present

## 2016-05-26 DIAGNOSIS — C189 Malignant neoplasm of colon, unspecified: Secondary | ICD-10-CM | POA: Diagnosis present

## 2016-05-26 DIAGNOSIS — C787 Secondary malignant neoplasm of liver and intrahepatic bile duct: Secondary | ICD-10-CM | POA: Diagnosis present

## 2016-05-26 DIAGNOSIS — I2510 Atherosclerotic heart disease of native coronary artery without angina pectoris: Secondary | ICD-10-CM | POA: Diagnosis not present

## 2016-05-26 DIAGNOSIS — C7802 Secondary malignant neoplasm of left lung: Secondary | ICD-10-CM | POA: Diagnosis not present

## 2016-05-26 DIAGNOSIS — K8020 Calculus of gallbladder without cholecystitis without obstruction: Secondary | ICD-10-CM | POA: Diagnosis not present

## 2016-05-26 LAB — GLUCOSE, CAPILLARY: GLUCOSE-CAPILLARY: 101 mg/dL — AB (ref 65–99)

## 2016-05-26 MED ORDER — FLUDEOXYGLUCOSE F - 18 (FDG) INJECTION: 7.5000 | Freq: Once | INTRAVENOUS | Status: DC | PRN

## 2016-05-26 NOTE — Progress Notes (Signed)
[place]  [phone] [phone]  Clinic Follow Up Note   Patient Care Team: Dione Housekeeper, MD as PCP - General (Family Medicine) Tania Ade, RN as Registered Nurse (Oncology) 05/28/2016   CHIEF COMPLAINTS:  Follow-up of metastatic colon cancer  Oncology History   Metastatic colon cancer to liver   Staging form: Colon and Rectum, AJCC 7th Edition     Clinical: Stage Unknown ([place], NX, M1) - Unsigned        Metastatic colon cancer to liver ([place])   07/14/2014 Imaging    CT abdomen and pelvis with contrast showed a 6 cm segment of sigmoid bowel wall thickening with adjacent 14 x 12 mm nodules, diffuse hepatic metastasis. No bowel obstruction.      07/14/2014 Initial Diagnosis    Metastatic colon cancer to liver      07/25/2014 Procedure    Colonoscopy showed a near circumferential medial mass in the sigmoid colon, partially obstructing consistent with carcinoma.      07/25/2014 Initial Biopsy    Sigmoid: Biopsy showed invasive adenocarcinoma arising in a background of high-grade dysplasia.      07/28/2014 Imaging    CT chest showed no evidence of metastasis      08/09/2014 - 01/16/2016 Chemotherapy    mFOLFOX6 every 2 weeks, dose reduction from cycle 7 due to cytopenia, Avastin added from cycle 3, stopped due to disease progression       08/11/2014 - 08/15/2014 Hospital Admission    Pt was admitted for fever and nausea, treated for UTI, liver biopsy cancelled due to the infection       10/29/2015 Imaging    CT chest, abdomen and pelvis with contrast 10/29/2015 IMPRESSION: 1. New/enlarging pulmonary nodules, most notably the 8 by 6 mm left lower lobe pulmonary nodule, favoring metastatic disease. 2. The liver lesions are subjectively grossly similar in size, difficult to measure due to the lack of IV contrast causing poor boundaries. 3. There is some faint heterogeneity of bony density in  the sternum, but this is not significantly changed from 07/28/2014 and is probably incidental given the lack of other evidence of osseous metastatic disease. 4. Lower lumbar spondylosis and degenerative disc disease causing impingement. 5. Coronary, aortic arch, and branch vessel atherosclerotic vascular disease. Aortoiliac atherosclerotic vascular disease. 6. Mildly dilated esophagus with air-fluid level, probably reflecting dysmotility. 7. Cholelithiasis. 8. Thick wall urinary bladder, cystitis not excluded.      01/28/2016 Progression    Restaging CT showed disease progression in lungs, and probably progression in liver mets also      02/14/2016 PET scan    IMPRESSION: 1. Examination is positive for hypermetabolic tumor within both lobes of liver. Index measurements provided above. 2. There are scattered pulmonary nodules within both lungs, most of these are too small to reliably characterize by PET-CT. In the left lower lobe there is a lung nodule which exhibits malignant range FDG uptake measuring 9 mm. 3. Aortic atherosclerosis and coronary artery calcifications.      02/20/2016 -  Chemotherapy    Second line chemo FOLFIRI and Avastin, every 2 weeks       03/28/2016 Imaging    CT Abdomen pelvis without contrast 03/28/2016 IMPRESSION: 1. Diffuse circumferential wall thickening throughout the colon, suggesting acute colitis. Intraluminal fluid density compatible with associated diarrheal illness. 2. Superimposed nodular area of bowel wall thickening within the sigmoid colon suspected to be related to patient's known primary adenocarcinoma. 3. Poor visualization of known hepatic  metastases. No other evidence for metastatic disease within the abdomen and pelvis. Previously identified pulmonary nodules within the left lower and right middle lobes have slightly increased in size, concerning for slightly progressive metastatic disease. 4. Cholelithiasis. 5. Diffuse  atherosclerosis with 3 vessel coronary artery calcifications.      05/26/2016 PET scan    PET 05/26/16: IMPRESSION: 1. Overall relatively stable pulmonary and liver metastases. No new sites of hypermetabolic metastatic disease. 2. Dominant hypermetabolic left lower lobe lung metastasis is mildly increased in size and stable in metabolism. Additional subcentimeter solid pulmonary nodules are below PET resolution and not convincingly changed in size. 3. Hypermetabolic liver metastases are stable in number with mixed mild metabolic changes as detailed. 4. Additional findings include aortic atherosclerosis, coronary atherosclerosis and cholelithiasis.       HISTORY OF PRESENTING ILLNESS:  Michelle Erickson 61 y.o. female is here because of abnoaml CT findings which is highly suspecious for metastatic colon cancer.   She presented intermittent nausea, anorexia and abdominal pain since Nov 2015, she has mild pain in the LUQ, crapy pain, positional (bending over),   it happened once every 2-3 weeks, and it has been more frequent in the past month. She has normal BM, but did notice the caliber of the stool is smaller than before. She denies any melena or hematochezia. She lost about 13 lbs in the past 6 months.   She presents to local emergency room in November 2015, and May 2016, was felt to be related to gastric virus. Due to the worsening symptoms lately, she went to [facility] ED on 07/13/2014, CT abdomen was obtained which showed multiple large liver metastasis and probable sigmoid colon mass. She was referred to [place] for further workup. She is scheduled to see GI Dr. Olevia Perches this afternoon.  CURRENT THERAPY: second line FOLFIRI and Avastin every 2 weeks, started 02/20/2016, hold Avastin if urine protein>=300  INTERIM HISTORY:  Michelle Erickson returns for follow-up and cycle 7 chemo. She presents with her husband. Michelle Erickson reports to be doing alright, Her energy level is not low according to her.  Her  Anemia is slightly lower than last week but she feels she does not need blood transfusion. She denies chest pain.   MEDICAL HISTORY Past Medical History:  Diagnosis Date  . Back pain   . Diabetes mellitus without complication ([place])   . Family history of colon cancer   . Hyperlipemia   . Hypertension   . Metastatic colon cancer to liver ([place]) 07/21/2014  . Snoring   . Wears glasses    contacts/glasses    SURGICAL HISTORY: Past Surgical History:  Procedure Laterality Date  . CATARACT EXTRACTION Bilateral   . MOUTH SURGERY    . PORTACATH PLACEMENT N/A 08/03/2014   Procedure: INSERTION PORT-A-CATH;  Surgeon: Stark Klein, MD;  Location: [facility];  Service: General;  Laterality: N/A;    SOCIAL HISTORY: Social History   Social History  . Marital status: Married    Spouse name: Charlotte Crumb  . Number of children: 2  . Years of education: N/A   Occupational History  . Not on file.   Social History Main Topics  . Smoking status: Former Smoker    Packs/day: 1.00    Years: 6.00    Types: Cigarettes    Quit date: 01/21/1976  . Smokeless tobacco: Never Used  . Alcohol use No  . Drug use: No  . Sexual activity: Yes    Birth control/ protection: Post-menopausal  Comment: # 2 pregnancies and #2 live births   Other Topics Concern  . Not on file   Social History Narrative   Married, husband [name]   Has #2 adult children   Previously worked at [employer] HISTORY: Family History  Problem Relation Age of Onset  . COPD Father   . Heart attack Father   . Arthritis Sister   . Diabetes Sister   . Colon cancer Sister     dx over 22  . Colon polyps Sister   . Seizures Brother   . Colon cancer Sister     dx in her early 76s, died in her early to mid 42s  . Diabetes Mother     ALLERGIES:  has No Known Allergies.  MEDICATIONS:  Current Outpatient Prescriptions  Medication Sig Dispense Refill  . amLODipine (NORVASC) 10 MG tablet Take 10 mg by mouth daily.      . ferrous sulfate 325 (65 FE) MG tablet Take 325 mg by mouth daily with breakfast.    . Insulin Glargine (LANTUS SOLOSTAR) 100 UNIT/ML Solostar Pen Inject 20 Units into the skin daily at 10 pm.     . insulin lispro (HUMALOG) 100 UNIT/ML injection Inject 4-8 Units into the skin 2 (two) times daily as needed for high blood sugar. Sliding scale    . lidocaine-prilocaine (EMLA) cream Apply 1 application topically once.     . ondansetron (ZOFRAN-ODT) 8 MG disintegrating tablet Take 8 mg by mouth every 8 (eight) hours as needed for nausea or vomiting. Reported on 05/02/2015    . diphenoxylate-atropine (LOMOTIL) 2.5-0.025 MG tablet Take 1-8 tablets by mouth 4 (four) times daily as needed for diarrhea or loose stools. (Patient not taking: Reported on 05/14/2016) 60 tablet 1   No current facility-administered medications for this visit.    Facility-Administered Medications Ordered in Other Visits  Medication Dose Route Frequency Provider Last Rate Last Dose  . fludeoxyglucose F - 18 (FDG) injection 7.5 millicurie  7.5 millicurie Intravenous Once PRN Poff, Nicoletta Dress, MD      . sodium chloride 0.9 % injection 10 mL  10 mL Intravenous PRN Truitt Merle, MD   10 mL at 05/14/16 0753    REVIEW OF SYSTEMS:   Constitutional: Denies fevers, chills or abnormal night sweats. Eyes: Denies blurriness of vision, double vision or watery eyes Ears, nose, mouth, throat, and face: Denies mucositis or sore throat Respiratory: Denies cough, dyspnea or wheezes Cardiovascular: Denies palpitation, chest discomfort Gastrointestinal:  Denies heartburn (+) Nausea and 1 episode of emesis Skin: Denies abnormal skin rashes Lymphatics: Denies new lymphadenopathy or easy bruising Neurological:Denies numbness, tingling or new weaknesses Behavioral/Psych: Mood is stable, no new changes  All other systems were reviewed with the patient and are negative.  PHYSICAL EXAMINATION: ECOG PERFORMANCE STATUS: 0  BP (!) 152/66 (BP  Location: Left Arm, Patient Position: Sitting)   Pulse 83   Temp 98 F (36.7 C) (Oral)   Resp 18   Ht '5\' 4"'$  (1.626 m)   Wt 153 lb 8 oz (69.6 kg)   SpO2 100%   BMI 26.35 kg/m    GENERAL:alert, no distress and comfortable. SKIN: skin color, texture, turgor are normal, no rashes or significant lesions EYES: normal, conjunctiva are pink and non-injected, sclera clear OROPHARYNX:no exudate, no erythema and lips, buccal mucosa, and tongue normal  NECK: supple, thyroid normal size, non-tender, without nodularity LYMPH:  no palpable lymphadenopathy in the cervical, axillary or inguinal LUNGS:  clear to auscultation and percussion with normal breathing effort HEART: regular rate & rhythm and no murmurs and no lower extremity edema ABDOMEN:abdomen soft, non-tender and normal bowel sounds Musculoskeletal:no cyanosis of digits and no clubbing  PSYCH: alert & oriented x 3 with fluent speech NEURO: no focal motor/sensory deficits  LABORATORY DATA:  I have reviewed the data as listed CBC Latest Ref Rng & Units 05/28/2016 05/14/2016 04/30/2016  WBC 3.9 - 10.3 10e3/uL 3.6(L) 4.6 4.4  Hemoglobin 11.6 - 15.9 g/dL 7.9(L) 8.0(L) 8.4(L)  Hematocrit 34.8 - 46.6 % 24.2(L) 25.6(L) 25.7(L)  Platelets 145 - 400 10e3/uL 172 134(L) 128(L)   CMP Latest Ref Rng & Units 05/28/2016 05/14/2016 04/30/2016  Glucose 70 - 140 mg/dl 266(H) 227(H) 204(H)  BUN 7.0 - 26.0 mg/dL 20.4 28.7(H) 23.6  Creatinine 0.6 - 1.1 mg/dL 1.7(H) 1.5(H) 1.4(H)  Sodium 136 - 145 mEq/L 137 139 139  Potassium 3.5 - 5.1 mEq/L 5.1 4.9 4.8  Chloride 101 - 111 mmol/L - - -  CO2 22 - 29 mEq/L '24 23 22  '$ Calcium 8.4 - 10.4 mg/dL 9.1 9.1 9.3  Total Protein 6.4 - 8.3 g/dL 6.5 6.6 6.7  Total Bilirubin 0.20 - 1.20 mg/dL <0.22 <0.22 0.25  Alkaline Phos 40 - 150 U/L 172(H) 159(H) 158(H)  AST 5 - 34 U/L '31 31 31  '$ ALT 0 - 55 U/L 34 29 34   CEA (0-5ng/ml) 07/21/2014: 594.5 11/23/2014: 18.2 03/07/2015: 6.6 09/19/2015: 37.9 10/29/2015: 63.7 11/28/2015:  28.99 01/02/2016: 40.81 01/28/2015: 47 03/05/16: 62.66 04/16/16: 92.49 05/14/16: 96.62   PATHOLOGY REPORTS: Diagnosis 07/25/2014  Surgical [P], sigmoid - INVASIVE ADENOCARCINOMA ARISING IN A BACKGROUND OF HIGH GRADE DYSPLASIA. - SEE COMMENT.  Diagnosis 09/05/2014 Liver, biopsy - METASTATIC ADENOCARCINOMA, SEE COMMENT.   RADIOGRAPHIC STUDIES: I have personally reviewed the radiological images as listed and agreed with the findings in the report.  PET 05/26/16: IMPRESSION: 1. Overall relatively stable pulmonary and liver metastases. No new sites of hypermetabolic metastatic disease. 2. Dominant hypermetabolic left lower lobe lung metastasis is mildly increased in size and stable in metabolism. Additional subcentimeter solid pulmonary nodules are below PET resolution and not convincingly changed in size. 3. Hypermetabolic liver metastases are stable in number with mixed mild metabolic changes as detailed. 4. Additional findings include aortic atherosclerosis, coronary atherosclerosis and cholelithiasis.   PET 02/14/2016 IMPRESSION: 1. Examination is positive for hypermetabolic tumor within both lobes of liver. Index measurements provided above. 2. There are scattered pulmonary nodules within both lungs, most of these are too small to reliably characterize by PET-CT. In the left lower lobe there is a lung nodule which exhibits malignant range FDG uptake measuring 9 mm. 3. Aortic atherosclerosis and coronary artery calcifications.  Colonoscopy 07/25/2014 Dr. Olevia Perches  ENDOSCOPIC IMPRESSION: Near circumferential medium mass was found in the sigmoid colon; multiple biopsies were performed using cold forceps, partially obstructing mass consistent with carcinoma. Placement of endoclips and tattoo at the margins of the mass which extends from 18-25 cm from the anus  ASSESSMENT & PLAN: 61 y.o. African-American female, presented with intermittent nausea vomiting and anemia.   1. 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 -The natural history of metastatic colon cancer and treatment options were discussed with patient  and her husband. Giving the diffuse metastasis in the liver, this is unfortunately incurable disease. I recommend systemic chemotherapy to control her metastatic disease, the goal of therapy is palliative and to prolong her life. -I previously reviewed her restaging CT scan from 01/28/2016, which showed slight disease progression in lungs, especailly the LLL lung nodule. Due to lack of CT contrast, it is difficult to evaluate the metastasis in the liver, but it appears to be progression also. -I previously discussed her PET scan findings, which showed hypermetabolic multiple liver metastasis and a few lung metastasis.  -she is on second line chemo FOLFIRI and Avastin, tolerating well overall  -Due to her severe diarrhea from cycle 3 chemotherapy, I reduced Irinotecan dose slightly, she is tolerating much better. -Pet scan from 05/26/16 reviewed with pt and her husband. It showed stable disease overall, mild slightly increased size of one lung met, no other new lesions, will continue chemo and avastin (if urine protein <=100)  -We'll continue follow-up her CEA tumor marker monthly. 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 -Her ferritin was previously normal, serum iron and saturation slightly low, TIBC low normal, I suggest her to take oral  iron supplement, she tolerates well, we'll continue once daily, with orange juice or vitamin C. -Iron 03/05/2016 was 64. -Her anemia has been stable overall, hemoglobin 8.0 previously, stable overall, she is asymptomatic, we'll continue observation. Her HBG is slightly lower than last visit but she feels well and does not need a transfusion today.   3. DM, HTN -Continue follow-up with PCP - we previously discussed the potential impact of chemotherapy and premedication dexamethasone on her blood pressure and sugar,will  Monitor closely. Her BG has been well controlled lately  -Her blood pressure has been high lately, we'll repeat in the infusion room. I previously encouraged her to measure her blood pressure at home. We previously discussed Avastin can increase her blood pressure, need to be monitored closely. If SBP persistently high than 150, I'll consider adding on a new antihypertensive medication. She will follow-up with her primary care physician also.  4. 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 Will continue monitoring renal function  -I previously encourage her to drink more fluids -We'll avoid NSAIDs and IV contrast for CT scan in the future. -Avastin was held previously, however proteinuria was improved and we restarted Avastin. -Urine protein by dipstick was 300 on 04/30/16. Held Avastin on 04/30/16.  5. Goal of care discussion  -We previously discussed the incurable nature of her cancer, and the overall poor prognosis, especially if she does not have good response to chemotherapy or progress on chemo -The patient understands the goal of care is palliative. -I recommended DNR/DNI, she will think about it.  6. Nausea -The patient has nausea secondary from chemotherapy, overall mild and tolerable --The patient is not taking her Zofran because she describes it working too late for her. -I previously advised her to take Zofran prophylactically, or drink ginger  tea.  Plan -restaging PET reviewed, SD -lab reviewed. Proceed with cycle 7 today. Continue Irinotecan dose '160mg'$ /m2 due to her history of severe diarrhea  -continue Avastin if urine protein<300. -f/u in 4 weeks   All questions were answered. The patient knows to call the clinic with  any problems, questions or concerns.  I spent 25 minutes counseling the patient face to face. The total time spent in the appointment was 30 minutes and more than 50% was on counseling. nts to them. This document has been checked and approved by the attending provider.    Truitt Merle, MD 05/28/2016   This document serves as a record of services personally performed by Truitt Merle, MD. It was created on her behalf by Joslyn Devon, a trained medical scribe. The creation of this record is based on the scribe's personal observations and the provider's statements to them. This document has been checked and approved by the attending provider.

## 2016-05-28 ENCOUNTER — Ambulatory Visit (HOSPITAL_BASED_OUTPATIENT_CLINIC_OR_DEPARTMENT_OTHER): Payer: BLUE CROSS/BLUE SHIELD | Admitting: Hematology

## 2016-05-28 ENCOUNTER — Encounter: Payer: Self-pay | Admitting: Hematology

## 2016-05-28 ENCOUNTER — Other Ambulatory Visit (HOSPITAL_BASED_OUTPATIENT_CLINIC_OR_DEPARTMENT_OTHER): Payer: BLUE CROSS/BLUE SHIELD

## 2016-05-28 ENCOUNTER — Ambulatory Visit (HOSPITAL_BASED_OUTPATIENT_CLINIC_OR_DEPARTMENT_OTHER): Payer: BLUE CROSS/BLUE SHIELD

## 2016-05-28 ENCOUNTER — Telehealth: Payer: Self-pay | Admitting: Hematology

## 2016-05-28 VITALS — BP 152/66 | HR 83 | Temp 98.0°F | Resp 18 | Ht 64.0 in | Wt 153.5 lb

## 2016-05-28 DIAGNOSIS — C787 Secondary malignant neoplasm of liver and intrahepatic bile duct: Secondary | ICD-10-CM | POA: Diagnosis not present

## 2016-05-28 DIAGNOSIS — C7802 Secondary malignant neoplasm of left lung: Secondary | ICD-10-CM

## 2016-05-28 DIAGNOSIS — C187 Malignant neoplasm of sigmoid colon: Secondary | ICD-10-CM

## 2016-05-28 DIAGNOSIS — Z5111 Encounter for antineoplastic chemotherapy: Secondary | ICD-10-CM | POA: Diagnosis not present

## 2016-05-28 DIAGNOSIS — C189 Malignant neoplasm of colon, unspecified: Secondary | ICD-10-CM

## 2016-05-28 DIAGNOSIS — Z5112 Encounter for antineoplastic immunotherapy: Secondary | ICD-10-CM

## 2016-05-28 DIAGNOSIS — Z452 Encounter for adjustment and management of vascular access device: Secondary | ICD-10-CM | POA: Diagnosis not present

## 2016-05-28 DIAGNOSIS — E119 Type 2 diabetes mellitus without complications: Secondary | ICD-10-CM

## 2016-05-28 DIAGNOSIS — I10 Essential (primary) hypertension: Secondary | ICD-10-CM

## 2016-05-28 DIAGNOSIS — N189 Chronic kidney disease, unspecified: Secondary | ICD-10-CM

## 2016-05-28 DIAGNOSIS — D630 Anemia in neoplastic disease: Secondary | ICD-10-CM

## 2016-05-28 DIAGNOSIS — Z95828 Presence of other vascular implants and grafts: Secondary | ICD-10-CM

## 2016-05-28 LAB — CBC WITH DIFFERENTIAL/PLATELET
BASO%: 0.5 % (ref 0.0–2.0)
BASOS ABS: 0 10*3/uL (ref 0.0–0.1)
EOS ABS: 0.2 10*3/uL (ref 0.0–0.5)
EOS%: 6.7 % (ref 0.0–7.0)
HEMATOCRIT: 24.2 % — AB (ref 34.8–46.6)
HEMOGLOBIN: 7.9 g/dL — AB (ref 11.6–15.9)
LYMPH#: 0.9 10*3/uL (ref 0.9–3.3)
LYMPH%: 25.8 % (ref 14.0–49.7)
MCH: 30.4 pg (ref 25.1–34.0)
MCHC: 32.5 g/dL (ref 31.5–36.0)
MCV: 93.7 fL (ref 79.5–101.0)
MONO#: 0.6 10*3/uL (ref 0.1–0.9)
MONO%: 16.8 % — ABNORMAL HIGH (ref 0.0–14.0)
NEUT%: 50.2 % (ref 38.4–76.8)
NEUTROS ABS: 1.8 10*3/uL (ref 1.5–6.5)
Platelets: 172 10*3/uL (ref 145–400)
RBC: 2.59 10*6/uL — ABNORMAL LOW (ref 3.70–5.45)
RDW: 17.3 % — AB (ref 11.2–14.5)
WBC: 3.6 10*3/uL — AB (ref 3.9–10.3)

## 2016-05-28 LAB — COMPREHENSIVE METABOLIC PANEL
ALBUMIN: 3.1 g/dL — AB (ref 3.5–5.0)
ALK PHOS: 172 U/L — AB (ref 40–150)
ALT: 34 U/L (ref 0–55)
AST: 31 U/L (ref 5–34)
Anion Gap: 7 mEq/L (ref 3–11)
BUN: 20.4 mg/dL (ref 7.0–26.0)
CALCIUM: 9.1 mg/dL (ref 8.4–10.4)
CO2: 24 mEq/L (ref 22–29)
CREATININE: 1.7 mg/dL — AB (ref 0.6–1.1)
Chloride: 106 mEq/L (ref 98–109)
EGFR: 38 mL/min/{1.73_m2} — ABNORMAL LOW (ref >90)
GLUCOSE: 266 mg/dL — AB (ref 70–140)
POTASSIUM: 5.1 meq/L (ref 3.5–5.1)
SODIUM: 137 meq/L (ref 136–145)
TOTAL PROTEIN: 6.5 g/dL (ref 6.4–8.3)

## 2016-05-28 LAB — UA PROTEIN, DIPSTICK - CHCC: PROTEIN: 100 mg/dL

## 2016-05-28 MED ORDER — IRINOTECAN HCL CHEMO INJECTION 100 MG/5ML
160.0000 mg/m2 | Freq: Once | INTRAVENOUS | Status: AC
  Administered 2016-05-28: 280 mg via INTRAVENOUS
  Filled 2016-05-28: qty 10

## 2016-05-28 MED ORDER — SODIUM CHLORIDE 0.9 % IJ SOLN
10.0000 mL | INTRAMUSCULAR | Status: DC | PRN
  Administered 2016-05-28: 10 mL via INTRAVENOUS
  Filled 2016-05-28: qty 10

## 2016-05-28 MED ORDER — ATROPINE SULFATE 1 MG/ML IJ SOLN
0.5000 mg | Freq: Once | INTRAMUSCULAR | Status: AC | PRN
  Administered 2016-05-28: 0.5 mg via INTRAVENOUS

## 2016-05-28 MED ORDER — PALONOSETRON HCL INJECTION 0.25 MG/5ML
0.2500 mg | Freq: Once | INTRAVENOUS | Status: AC
  Administered 2016-05-28: 0.25 mg via INTRAVENOUS

## 2016-05-28 MED ORDER — DEXAMETHASONE SODIUM PHOSPHATE 10 MG/ML IJ SOLN
10.0000 mg | Freq: Once | INTRAMUSCULAR | Status: AC
  Administered 2016-05-28: 10 mg via INTRAVENOUS

## 2016-05-28 MED ORDER — SODIUM CHLORIDE 0.9 % IV SOLN
400.0000 mg/m2 | Freq: Once | INTRAVENOUS | Status: AC
  Administered 2016-05-28: 720 mg via INTRAVENOUS
  Filled 2016-05-28: qty 36

## 2016-05-28 MED ORDER — SODIUM CHLORIDE 0.9 % IV SOLN
5.0000 mg/kg | Freq: Once | INTRAVENOUS | Status: AC
  Administered 2016-05-28: 350 mg via INTRAVENOUS
  Filled 2016-05-28: qty 14

## 2016-05-28 MED ORDER — SODIUM CHLORIDE 0.9 % IV SOLN
Freq: Once | INTRAVENOUS | Status: AC
  Administered 2016-05-28: 13:00:00 via INTRAVENOUS

## 2016-05-28 MED ORDER — ATROPINE SULFATE 1 MG/ML IJ SOLN
INTRAMUSCULAR | Status: AC
  Filled 2016-05-28: qty 1

## 2016-05-28 MED ORDER — PALONOSETRON HCL INJECTION 0.25 MG/5ML
INTRAVENOUS | Status: AC
  Filled 2016-05-28: qty 5

## 2016-05-28 MED ORDER — SODIUM CHLORIDE 0.9 % IV SOLN
2200.0000 mg/m2 | INTRAVENOUS | Status: DC
  Administered 2016-05-28: 3950 mg via INTRAVENOUS
  Filled 2016-05-28: qty 79

## 2016-05-28 MED ORDER — DEXAMETHASONE SODIUM PHOSPHATE 10 MG/ML IJ SOLN
INTRAMUSCULAR | Status: AC
  Filled 2016-05-28: qty 1

## 2016-05-28 NOTE — Progress Notes (Signed)
Dr. Burr Medico aware of urine protein, Hgb, and creatinine. Advised ok to treat.

## 2016-05-28 NOTE — Telephone Encounter (Signed)
Appointments scheduled per 5.9.18 LOS. Patient given AVS report and calendars with future scheduled appointments. °

## 2016-05-28 NOTE — Patient Instructions (Signed)

## 2016-05-28 NOTE — Patient Instructions (Signed)
Michelle Erickson Discharge Instructions for Patients Receiving Chemotherapy  Today you received the following chemotherapy agents:  Avastin, Irinotecan, 5FU, and Leucovorin.  To help prevent nausea and vomiting after your treatment, we encourage you to take your nausea medication as directed.   If you develop nausea and vomiting that is not controlled by your nausea medication, call the clinic.   BELOW ARE SYMPTOMS THAT SHOULD BE REPORTED IMMEDIATELY:  *FEVER GREATER THAN 100.5 F  *CHILLS WITH OR WITHOUT FEVER  NAUSEA AND VOMITING THAT IS NOT CONTROLLED WITH YOUR NAUSEA MEDICATION  *UNUSUAL SHORTNESS OF BREATH  *UNUSUAL BRUISING OR BLEEDING  TENDERNESS IN MOUTH AND THROAT WITH OR WITHOUT PRESENCE OF ULCERS  *URINARY PROBLEMS  *BOWEL PROBLEMS  UNUSUAL RASH Items with * indicate a potential emergency and should be followed up as soon as possible.  Feel free to call the clinic you have any questions or concerns. The clinic phone number is [phone].  Please show the Advance at check-in to the Emergency Department and triage nurse.

## 2016-05-30 ENCOUNTER — Ambulatory Visit (HOSPITAL_BASED_OUTPATIENT_CLINIC_OR_DEPARTMENT_OTHER): Payer: BLUE CROSS/BLUE SHIELD

## 2016-05-30 VITALS — BP 119/69 | HR 83 | Temp 98.7°F | Resp 18

## 2016-05-30 DIAGNOSIS — Z452 Encounter for adjustment and management of vascular access device: Secondary | ICD-10-CM

## 2016-05-30 DIAGNOSIS — C189 Malignant neoplasm of colon, unspecified: Secondary | ICD-10-CM

## 2016-05-30 DIAGNOSIS — C187 Malignant neoplasm of sigmoid colon: Secondary | ICD-10-CM

## 2016-05-30 DIAGNOSIS — C787 Secondary malignant neoplasm of liver and intrahepatic bile duct: Principal | ICD-10-CM

## 2016-05-30 MED ORDER — HEPARIN SOD (PORK) LOCK FLUSH 100 UNIT/ML IV SOLN
500.0000 [IU] | Freq: Once | INTRAVENOUS | Status: AC | PRN
  Administered 2016-05-30: 500 [IU]
  Filled 2016-05-30: qty 5

## 2016-05-30 MED ORDER — SODIUM CHLORIDE 0.9% FLUSH
10.0000 mL | INTRAVENOUS | Status: DC | PRN
  Administered 2016-05-30: 10 mL
  Filled 2016-05-30: qty 10

## 2016-05-30 NOTE — Patient Instructions (Signed)
Implanted Port Home Guide An implanted port is a type of central line that is placed under the skin. Central lines are used to provide IV access when treatment or nutrition needs to be given through a person's veins. Implanted ports are used for long-term IV access. An implanted port may be placed because:  You need IV medicine that would be irritating to the small veins in your hands or arms.  You need long-term IV medicines, such as antibiotics.  You need IV nutrition for a long period.  You need frequent blood draws for lab tests.  You need dialysis.  Implanted ports are usually placed in the chest area, but they can also be placed in the upper arm, the abdomen, or the leg. An implanted port has two main parts:  Reservoir. The reservoir is round and will appear as a small, raised area under your skin. The reservoir is the part where a needle is inserted to give medicines or draw blood.  Catheter. The catheter is a thin, flexible tube that extends from the reservoir. The catheter is placed into a large vein. Medicine that is inserted into the reservoir goes into the catheter and then into the vein.  How will I care for my incision site? Do not get the incision site wet. Bathe or shower as directed by your health care provider. How is my port accessed? Special steps must be taken to access the port:  Before the port is accessed, a numbing cream can be placed on the skin. This helps numb the skin over the port site.  Your health care provider uses a sterile technique to access the port. ? Your health care provider must put on a mask and sterile gloves. ? The skin over your port is cleaned carefully with an antiseptic and allowed to dry. ? The port is gently pinched between sterile gloves, and a needle is inserted into the port.  Only "non-coring" port needles should be used to access the port. Once the port is accessed, a blood return should be checked. This helps ensure that the port  is in the vein and is not clogged.  If your port needs to remain accessed for a constant infusion, a clear (transparent) bandage will be placed over the needle site. The bandage and needle will need to be changed every week, or as directed by your health care provider.  Keep the bandage covering the needle clean and dry. Do not get it wet. Follow your health care provider's instructions on how to take a shower or bath while the port is accessed.  If your port does not need to stay accessed, no bandage is needed over the port.  What is flushing? Flushing helps keep the port from getting clogged. Follow your health care provider's instructions on how and when to flush the port. Ports are usually flushed with saline solution or a medicine called heparin. The need for flushing will depend on how the port is used.  If the port is used for intermittent medicines or blood draws, the port will need to be flushed: ? After medicines have been given. ? After blood has been drawn. ? As part of routine maintenance.  If a constant infusion is running, the port may not need to be flushed.  How long will my port stay implanted? The port can stay in for as long as your health care provider thinks it is needed. When it is time for the port to come out, surgery will be   done to remove it. The procedure is similar to the one performed when the port was put in. When should I seek immediate medical care? When you have an implanted port, you should seek immediate medical care if:  You notice a bad smell coming from the incision site.  You have swelling, redness, or drainage at the incision site.  You have more swelling or pain at the port site or the surrounding area.  You have a fever that is not controlled with medicine.  This information is not intended to replace advice given to you by your health care provider. Make sure you discuss any questions you have with your health care provider. Document  Released: 01/06/2005 Document Revised: 06/14/2015 Document Reviewed: 09/13/2012 Elsevier Interactive Patient Education  2017 Elsevier Inc.  

## 2016-06-08 ENCOUNTER — Emergency Department (HOSPITAL_COMMUNITY)
Admission: EM | Admit: 2016-06-08 | Discharge: 2016-06-09 | Disposition: A | Discharge status: Home / Self Care | Payer: BLUE CROSS/BLUE SHIELD | Attending: Emergency Medicine | Admitting: Emergency Medicine

## 2016-06-08 ENCOUNTER — Encounter (HOSPITAL_COMMUNITY): Payer: Self-pay

## 2016-06-08 DIAGNOSIS — Z79899 Other long term (current) drug therapy: Secondary | ICD-10-CM | POA: Diagnosis not present

## 2016-06-08 DIAGNOSIS — R1030 Lower abdominal pain, unspecified: Secondary | ICD-10-CM | POA: Diagnosis not present

## 2016-06-08 DIAGNOSIS — Z794 Long term (current) use of insulin: Secondary | ICD-10-CM | POA: Diagnosis not present

## 2016-06-08 DIAGNOSIS — Z85038 Personal history of other malignant neoplasm of large intestine: Secondary | ICD-10-CM | POA: Diagnosis not present

## 2016-06-08 DIAGNOSIS — I10 Essential (primary) hypertension: Secondary | ICD-10-CM | POA: Diagnosis not present

## 2016-06-08 DIAGNOSIS — E860 Dehydration: Secondary | ICD-10-CM | POA: Diagnosis not present

## 2016-06-08 DIAGNOSIS — R531 Weakness: Secondary | ICD-10-CM | POA: Diagnosis present

## 2016-06-08 DIAGNOSIS — Z8505 Personal history of malignant neoplasm of liver: Secondary | ICD-10-CM | POA: Insufficient documentation

## 2016-06-08 DIAGNOSIS — Z87891 Personal history of nicotine dependence: Secondary | ICD-10-CM | POA: Insufficient documentation

## 2016-06-08 DIAGNOSIS — E119 Type 2 diabetes mellitus without complications: Secondary | ICD-10-CM | POA: Insufficient documentation

## 2016-06-08 DIAGNOSIS — R110 Nausea: Secondary | ICD-10-CM

## 2016-06-08 LAB — URINALYSIS, ROUTINE W REFLEX MICROSCOPIC
Bilirubin Urine: NEGATIVE
GLUCOSE, UA: NEGATIVE mg/dL
KETONES UR: NEGATIVE mg/dL
Leukocytes, UA: NEGATIVE
NITRITE: NEGATIVE
PH: 5 (ref 5.0–8.0)
Protein, ur: 100 mg/dL — AB
Specific Gravity, Urine: 1.014 (ref 1.005–1.030)

## 2016-06-08 LAB — CBC
HCT: 23.2 % — ABNORMAL LOW (ref 36.0–46.0)
HEMOGLOBIN: 7.7 g/dL — AB (ref 12.0–15.0)
MCH: 30.1 pg (ref 26.0–34.0)
MCHC: 33.2 g/dL (ref 30.0–36.0)
MCV: 90.6 fL (ref 78.0–100.0)
Platelets: 177 10*3/uL (ref 150–400)
RBC: 2.56 MIL/uL — ABNORMAL LOW (ref 3.87–5.11)
RDW: 15.8 % — ABNORMAL HIGH (ref 11.5–15.5)
WBC: 2.9 10*3/uL — ABNORMAL LOW (ref 4.0–10.5)

## 2016-06-08 LAB — COMPREHENSIVE METABOLIC PANEL
ALT: 23 U/L (ref 14–54)
ANION GAP: 9 (ref 5–15)
AST: 28 U/L (ref 15–41)
Albumin: 3.3 g/dL — ABNORMAL LOW (ref 3.5–5.0)
Alkaline Phosphatase: 142 U/L — ABNORMAL HIGH (ref 38–126)
BILIRUBIN TOTAL: 0.7 mg/dL (ref 0.3–1.2)
BUN: 35 mg/dL — ABNORMAL HIGH (ref 6–20)
CHLORIDE: 103 mmol/L (ref 101–111)
CO2: 21 mmol/L — AB (ref 22–32)
Calcium: 8.7 mg/dL — ABNORMAL LOW (ref 8.9–10.3)
Creatinine, Ser: 2.2 mg/dL — ABNORMAL HIGH (ref 0.44–1.00)
GFR calc Af Amer: 26 mL/min — ABNORMAL LOW (ref >60)
GFR calc non Af Amer: 23 mL/min — ABNORMAL LOW (ref >60)
GLUCOSE: 85 mg/dL (ref 65–99)
POTASSIUM: 4.4 mmol/L (ref 3.5–5.1)
SODIUM: 133 mmol/L — AB (ref 135–145)
TOTAL PROTEIN: 6.9 g/dL (ref 6.5–8.1)

## 2016-06-08 LAB — LIPASE, BLOOD: LIPASE: 12 U/L (ref 11–51)

## 2016-06-08 MED ORDER — PROMETHAZINE HCL 25 MG PO TABS: 12.5000 mg | ORAL_TABLET | Freq: Three times a day (TID) | ORAL | 0 refills | Status: DC | PRN

## 2016-06-08 MED ORDER — ONDANSETRON HCL 4 MG/2ML IJ SOLN
4.0000 mg | Freq: Once | INTRAMUSCULAR | Status: AC
  Administered 2016-06-08: 4 mg via INTRAVENOUS
  Filled 2016-06-08: qty 2

## 2016-06-08 MED ORDER — SODIUM CHLORIDE 0.9 % IV BOLUS (SEPSIS)
500.0000 mL | Freq: Once | INTRAVENOUS | Status: AC
  Administered 2016-06-08: 500 mL via INTRAVENOUS

## 2016-06-08 MED ORDER — MORPHINE SULFATE (PF) 4 MG/ML IV SOLN
4.0000 mg | Freq: Once | INTRAVENOUS | Status: AC
  Administered 2016-06-08: 4 mg via INTRAVENOUS
  Filled 2016-06-08: qty 1

## 2016-06-08 NOTE — ED Provider Notes (Signed)
[facility] DEPT Provider Note   CSN: [id number] Arrival date & time: 06/08/16  2021     History   Chief Complaint Chief Complaint  Patient presents with  . Abdominal Pain    HPI Michelle Erickson is a 61 y.o. female.  The history is provided by the patient. No language interpreter was used.  Abdominal Pain      Michelle Erickson is a 61 y.o. female who presents to the Emergency Department complaining of abdominal pain, Nausea, weakness.  She has a history of metastatic colon cancer is currently undergoing palliative chemotherapy. Over the last few weeks she started a new chemotherapy and has developed increased nausea. Over the last few days she has nausea and increased lower abdominal pain described as crampy and menstrual type pain. She endorses associated generalized weakness. No fevers, vomiting, diarrhea, dysuria, vaginal discharge, vaginal bleeding, melena. Symptoms are moderate, constant, worsening.  Past Medical History:  Diagnosis Date  . Back pain   . Diabetes mellitus without complication ([place])   . Family history of colon cancer   . Hyperlipemia   . Hypertension   . Metastatic colon cancer to liver ([place]) 07/21/2014  . Snoring   . Wears glasses    contacts/glasses    Patient Active Problem List   Diagnosis Date Noted  . Goals of care, counseling/discussion 01/31/2016  . Port catheter in place 05/16/2015  . Genetic testing 08/31/2014  . 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 DM (diabetes mellitus), type 2 ([place]) 08/11/2014  . Family history of colon cancer   . Metastatic colon cancer to liver ([place]) 07/21/2014    Past Surgical History:  Procedure Laterality Date  . CATARACT EXTRACTION Bilateral   . MOUTH SURGERY    . PORTACATH PLACEMENT N/A 08/03/2014   Procedure: INSERTION PORT-A-CATH;  Surgeon: Stark Klein, MD;  Location: [facility];  Service: General;  Laterality: N/A;    OB History    No data available       Home Medications    Prior to Admission  medications   Medication Sig Start Date End Date Taking? Authorizing Provider  amLODipine (NORVASC) 10 MG tablet Take 10 mg by mouth every morning.    Yes [provider]  atorvastatin (LIPITOR) 40 MG tablet Take 40 mg by mouth every evening.   Yes [provider]  ferrous sulfate 325 (65 FE) MG tablet Take 325 mg by mouth daily with breakfast.   Yes [provider]  Insulin Glargine (LANTUS SOLOSTAR) 100 UNIT/ML Solostar Pen Inject 20 Units into the skin daily at 10 pm.    Yes [provider]  insulin lispro (HUMALOG) 100 UNIT/ML injection Inject 4-8 Units into the skin 2 (two) times daily as needed for high blood sugar. Sliding scale   Yes [provider]  lidocaine-prilocaine (EMLA) cream Apply 1 application topically once.  08/08/14  Yes [provider]  ondansetron (ZOFRAN-ODT) 8 MG disintegrating tablet Take 8 mg by mouth every 8 (eight) hours as needed for nausea or vomiting. Reported on 05/02/2015   Yes [provider]  diphenoxylate-atropine (LOMOTIL) 2.5-0.025 MG tablet Take 1-8 tablets by mouth 4 (four) times daily as needed for diarrhea or loose stools. Patient not taking: Reported on 05/14/2016 04/16/16   Truitt Merle, MD  promethazine (PHENERGAN) 25 MG tablet Take 0.5-1 tablets (12.5-25 mg total) by mouth every 8 (eight) hours as needed for nausea or vomiting. 06/08/16   Quintella Reichert, MD    Family History Family History  Problem Relation Age of Onset  . COPD Father   . Heart attack Father   . Arthritis Sister   . Diabetes Sister   . Colon cancer Sister        dx over 77  . Colon polyps Sister   . Seizures Brother   . Colon cancer Sister        dx in her early 3s, died in her early to mid 28s  . Diabetes Mother     Social History Social History  Substance Use Topics  . Smoking status: Former Smoker    Packs/day: 1.00    Years: 6.00    Types: Cigarettes    Quit date: 01/21/1976  . Smokeless tobacco: Never Used    . Alcohol use No     Allergies   Patient has no known allergies.   Review of Systems Review of Systems  Gastrointestinal: Positive for abdominal pain.  All other systems reviewed and are negative.    Physical Exam Updated Vital Signs BP (!) 136/55   Pulse 90   Temp 99 F (37.2 C) (Oral)   Resp 16   Wt 147 lb (66.7 kg)   SpO2 100%   BMI 25.23 kg/m   Physical Exam  Constitutional: She is oriented to person, place, and time. She appears well-developed and well-nourished.  HENT:  Head: Normocephalic and atraumatic.  Cardiovascular: Normal rate and regular rhythm.   No murmur heard. Pulmonary/Chest: Effort normal and breath sounds normal. No respiratory distress.  Abdominal: Soft. There is no rebound and no guarding.  Mild lower abdominal tenderness.  Musculoskeletal: She exhibits no edema or tenderness.  Neurological: She is alert and oriented to person, place, and time.  Skin: Skin is warm and dry.  Psychiatric: She has a normal mood and affect. Her behavior is normal.  Nursing note and vitals reviewed.    ED Treatments / Results  Labs (all labs ordered are listed, but only abnormal results are displayed) Labs Reviewed  COMPREHENSIVE METABOLIC PANEL - Abnormal; Notable for the following:       Result Value   Sodium 133 (*)    CO2 21 (*)    BUN 35 (*)    Creatinine, Ser 2.20 (*)    Calcium 8.7 (*)    Albumin 3.3 (*)    Alkaline Phosphatase 142 (*)    GFR calc non Af Amer 23 (*)    GFR calc Af Amer 26 (*)    All other components within normal limits  CBC - Abnormal; Notable for the following:    WBC 2.9 (*)    RBC 2.56 (*)    Hemoglobin 7.7 (*)    HCT 23.2 (*)    RDW 15.8 (*)    All other components within normal limits  URINALYSIS, ROUTINE W REFLEX MICROSCOPIC - Abnormal; Notable for the following:    APPearance CLOUDY (*)    Hgb urine dipstick SMALL (*)    Protein, ur 100 (*)    Bacteria, UA RARE (*)    Squamous Epithelial / LPF 6-30 (*)    All  other components within normal limits  LIPASE, BLOOD    EKG  EKG Interpretation None       Radiology No results found.  Procedures Procedures (including critical care time)  Medications Ordered in ED Medications  sodium chloride 0.9 % bolus 500 mL (0 mLs Intravenous Stopped 06/08/16 2152)  ondansetron (ZOFRAN) injection 4 mg (4 mg Intravenous Given 06/08/16 2108)  morphine 4 MG/ML injection  4 mg (4 mg Intravenous Given 06/08/16 2108)  sodium chloride 0.9 % bolus 500 mL (0 mLs Intravenous Stopped 06/08/16 2252)     Initial Impression / Assessment and Plan / ED Course  I have reviewed the triage vital signs and the nursing notes.  Pertinent labs & imaging results that were available during my care of the patient were reviewed by me and considered in my medical decision making (see chart for details).   patient with metastatic colon cancer here with lower abdominal cramping, nausea, decreased oral intake and weakness over the last several days to weeks. She has mild tenderness on initial evaluation that is resolved after medications in the emergency department. She feels significantly improved after IV fluids and meds. She is able to tolerate fluids without difficulty in the ED. No evidence of acute abdomen, bowel obstruction, acute UTI. CBC demonstrates stable anemia. BMP with slight worsening in her chronic renal insufficiency. Counseled patient on close oncology follow-up for repeat assessment. She will need recheck of her hemoglobin as well as her BMP. Discussed close return precautions.  Final Clinical Impressions(s) / ED Diagnoses   Final diagnoses:  Nausea  Dehydration    New Prescriptions New Prescriptions   PROMETHAZINE (PHENERGAN) 25 MG TABLET    Take 0.5-1 tablets (12.5-25 mg total) by mouth every 8 (eight) hours as needed for nausea or vomiting.     Quintella Reichert, MD 06/09/16 [facility]

## 2016-06-08 NOTE — ED Triage Notes (Signed)
Since the 9th of this month had chemo and since then has had abdominal pain with nausea and no fever no dysuria voiced.

## 2016-06-09 NOTE — ED Notes (Signed)
Pt tolerated PO challenge without nausea.

## 2016-06-10 ENCOUNTER — Other Ambulatory Visit: Payer: Self-pay | Admitting: Hematology

## 2016-06-10 ENCOUNTER — Telehealth: Payer: Self-pay | Admitting: *Deleted

## 2016-06-10 NOTE — Telephone Encounter (Signed)
Pt called and left message wanting to cancel chemo for 06/11/16.  Spoke with pt and was informed that pt went to ER on "Sunday night , and was discharged home on early Monday morning.  Stated had symptoms of nausea/vomiting, severe abdominal cramping.   Bowel and bladder function fine.  Pt was given Morphine in ER for abdominal pain - stated relief.   Pt was given Phenergan for nausea.   Since discharged home early on Monday, pt has very mild abdominal cramping; mushy stools but no diarrhea.  Denied nausea/vomiting so far.  Able to eat and drink - can keep food down fine. Pt stated she would like to hold off on chemo until  June 6.     Dr. Feng notified. Pt's    Phone     33" [phone].

## 2016-06-10 NOTE — Telephone Encounter (Signed)
Spoke with pt and offered pt appt for office visit with Dr. Burr Medico on 5/23 as per md's instructions.  Pt declined stating that she just needed to rest.  Pt aware of next office visit will be June 6.  Chemo appt will be cancelled for 06/11/16.

## 2016-06-11 ENCOUNTER — Other Ambulatory Visit: Payer: BLUE CROSS/BLUE SHIELD

## 2016-06-11 ENCOUNTER — Ambulatory Visit (HOSPITAL_BASED_OUTPATIENT_CLINIC_OR_DEPARTMENT_OTHER): Payer: BLUE CROSS/BLUE SHIELD | Admitting: Hematology

## 2016-06-11 ENCOUNTER — Ambulatory Visit: Payer: BLUE CROSS/BLUE SHIELD

## 2016-06-11 ENCOUNTER — Ambulatory Visit (HOSPITAL_BASED_OUTPATIENT_CLINIC_OR_DEPARTMENT_OTHER): Payer: BLUE CROSS/BLUE SHIELD

## 2016-06-11 ENCOUNTER — Ambulatory Visit: Payer: BLUE CROSS/BLUE SHIELD | Admitting: Hematology

## 2016-06-11 VITALS — BP 129/52 | HR 87 | Temp 99.0°F | Resp 18 | Ht 64.0 in | Wt 147.2 lb

## 2016-06-11 VITALS — BP 140/63 | HR 92

## 2016-06-11 DIAGNOSIS — E119 Type 2 diabetes mellitus without complications: Secondary | ICD-10-CM

## 2016-06-11 DIAGNOSIS — R110 Nausea: Secondary | ICD-10-CM | POA: Diagnosis not present

## 2016-06-11 DIAGNOSIS — D630 Anemia in neoplastic disease: Secondary | ICD-10-CM

## 2016-06-11 DIAGNOSIS — N189 Chronic kidney disease, unspecified: Secondary | ICD-10-CM | POA: Diagnosis not present

## 2016-06-11 DIAGNOSIS — R109 Unspecified abdominal pain: Secondary | ICD-10-CM

## 2016-06-11 DIAGNOSIS — C187 Malignant neoplasm of sigmoid colon: Secondary | ICD-10-CM

## 2016-06-11 DIAGNOSIS — C7800 Secondary malignant neoplasm of unspecified lung: Secondary | ICD-10-CM

## 2016-06-11 DIAGNOSIS — C787 Secondary malignant neoplasm of liver and intrahepatic bile duct: Principal | ICD-10-CM

## 2016-06-11 DIAGNOSIS — C189 Malignant neoplasm of colon, unspecified: Secondary | ICD-10-CM

## 2016-06-11 DIAGNOSIS — I10 Essential (primary) hypertension: Secondary | ICD-10-CM

## 2016-06-11 DIAGNOSIS — R197 Diarrhea, unspecified: Secondary | ICD-10-CM

## 2016-06-11 LAB — COMPREHENSIVE METABOLIC PANEL
ALT: 20 U/L (ref 0–55)
ANION GAP: 9 meq/L (ref 3–11)
AST: 29 U/L (ref 5–34)
Albumin: 2.8 g/dL — ABNORMAL LOW (ref 3.5–5.0)
Alkaline Phosphatase: 148 U/L (ref 40–150)
BUN: 25 mg/dL (ref 7.0–26.0)
CALCIUM: 8.8 mg/dL (ref 8.4–10.4)
CHLORIDE: 104 meq/L (ref 98–109)
CO2: 22 mEq/L (ref 22–29)
Creatinine: 1.9 mg/dL — ABNORMAL HIGH (ref 0.6–1.1)
EGFR: 31 mL/min/{1.73_m2} — ABNORMAL LOW (ref >90)
Glucose: 73 mg/dl (ref 70–140)
POTASSIUM: 4.5 meq/L (ref 3.5–5.1)
Sodium: 135 mEq/L — ABNORMAL LOW (ref 136–145)
Total Bilirubin: 0.39 mg/dL (ref 0.20–1.20)
Total Protein: 6.3 g/dL — ABNORMAL LOW (ref 6.4–8.3)

## 2016-06-11 LAB — CBC WITH DIFFERENTIAL/PLATELET
BASO%: 0.5 % (ref 0.0–2.0)
BASOS ABS: 0 10*3/uL (ref 0.0–0.1)
EOS%: 4.2 % (ref 0.0–7.0)
Eosinophils Absolute: 0.2 10*3/uL (ref 0.0–0.5)
HCT: 23.5 % — ABNORMAL LOW (ref 34.8–46.6)
HGB: 7.6 g/dL — ABNORMAL LOW (ref 11.6–15.9)
LYMPH#: 1.1 10*3/uL (ref 0.9–3.3)
LYMPH%: 24.4 % (ref 14.0–49.7)
MCH: 29.6 pg (ref 25.1–34.0)
MCHC: 32.3 g/dL (ref 31.5–36.0)
MCV: 91.4 fL (ref 79.5–101.0)
MONO#: 1.3 10*3/uL — AB (ref 0.1–0.9)
MONO%: 29.5 % — ABNORMAL HIGH (ref 0.0–14.0)
NEUT#: 1.8 10*3/uL (ref 1.5–6.5)
NEUT%: 41.4 % (ref 38.4–76.8)
PLATELETS: 183 10*3/uL (ref 145–400)
RBC: 2.57 10*6/uL — ABNORMAL LOW (ref 3.70–5.45)
RDW: 15.9 % — ABNORMAL HIGH (ref 11.2–14.5)
WBC: 4.3 10*3/uL (ref 3.9–10.3)

## 2016-06-11 LAB — TECHNOLOGIST REVIEW

## 2016-06-11 LAB — CEA (IN HOUSE-CHCC): CEA (CHCC-IN HOUSE): 145.13 ng/mL — AB (ref 0.00–5.00)

## 2016-06-11 LAB — UA PROTEIN, DIPSTICK - CHCC: PROTEIN: 100 mg/dL

## 2016-06-11 MED ORDER — TRAMADOL HCL 50 MG PO TABS: 50.0000 mg | ORAL_TABLET | Freq: Four times a day (QID) | ORAL | 1 refills | Status: DC | PRN

## 2016-06-11 MED ORDER — SODIUM CHLORIDE 0.9 % IV SOLN
500.0000 mL | Freq: Once | INTRAVENOUS | Status: AC
  Administered 2016-06-11: 500 mL via INTRAVENOUS

## 2016-06-11 MED ORDER — HEPARIN SOD (PORK) LOCK FLUSH 100 UNIT/ML IV SOLN
500.0000 [IU] | Freq: Once | INTRAVENOUS | Status: AC
  Administered 2016-06-11: 500 [IU]
  Filled 2016-06-11: qty 5

## 2016-06-11 MED ORDER — SODIUM CHLORIDE 0.9 % IJ SOLN
10.0000 mL | Freq: Once | INTRAMUSCULAR | Status: AC
  Administered 2016-06-11: 10 mL
  Filled 2016-06-11: qty 10

## 2016-06-11 NOTE — Patient Instructions (Signed)
Dehydration, Adult Dehydration is when there is not enough fluid or water in your body. This happens when you lose more fluids than you take in. Dehydration can range from mild to very bad. It should be treated right away to keep it from getting very bad. Symptoms of mild dehydration may include:   Thirst.  Dry lips.  Slightly dry mouth.  Dry, warm skin.  Dizziness. Symptoms of moderate dehydration may include:   Very dry mouth.  Muscle cramps.  Dark pee (urine). Pee may be the color of tea.  Your body making less pee.  Your eyes making fewer tears.  Heartbeat that is uneven or faster than normal (palpitations).  Headache.  Light-headedness, especially when you stand up from sitting.  Fainting (syncope). Symptoms of very bad dehydration may include:   Changes in skin, such as:  Cold and clammy skin.  Blotchy (mottled) or pale skin.  Skin that does not quickly return to normal after being lightly pinched and let go (poor skin turgor).  Changes in body fluids, such as:  Feeling very thirsty.  Your eyes making fewer tears.  Not sweating when body temperature is high, such as in hot weather.  Your body making very little pee.  Changes in vital signs, such as:  Weak pulse.  Pulse that is more than 100 beats a minute when you are sitting still.  Fast breathing.  Low blood pressure.  Other changes, such as:  Sunken eyes.  Cold hands and feet.  Confusion.  Lack of energy (lethargy).  Trouble waking up from sleep.  Short-term weight loss.  Unconsciousness. Follow these instructions at home:  If told by your doctor, drink an ORS:  Make an ORS by using instructions on the package.  Start by drinking small amounts, about  cup (120 mL) every 5-10 minutes.  Slowly drink more until you have had the amount that your doctor said to have.  Drink enough clear fluid to keep your pee clear or pale yellow. If you were told to drink an ORS, finish the  ORS first, then start slowly drinking clear fluids. Drink fluids such as:  Water. Do not drink only water by itself. Doing that can make the salt (sodium) level in your body get too low (hyponatremia).  Ice chips.  Fruit juice that you have added water to (diluted).  Low-calorie sports drinks.  Avoid:  Alcohol.  Drinks that have a lot of sugar. These include high-calorie sports drinks, fruit juice that does not have water added, and soda.  Caffeine.  Foods that are greasy or have a lot of fat or sugar.  Take over-the-counter and prescription medicines only as told by your doctor.  Do not take salt tablets. Doing that can make the salt level in your body get too high (hypernatremia).  Eat foods that have minerals (electrolytes). Examples include bananas, oranges, potatoes, tomatoes, and spinach.  Keep all follow-up visits as told by your doctor. This is important. Contact a doctor if:  You have belly (abdominal) pain that:  Gets worse.  Stays in one area (localizes).  You have a rash.  You have a stiff neck.  You get angry or annoyed more easily than normal (irritability).  You are more sleepy than normal.  You have a harder time waking up than normal.  You feel:  Weak.  Dizzy.  Very thirsty.  You have peed (urinated) only a small amount of very dark pee during 6-8 hours. Get help right away if:  You   have symptoms of very bad dehydration.  You cannot drink fluids without throwing up (vomiting).  Your symptoms get worse with treatment.  You have a fever.  You have a very bad headache.  You are throwing up or having watery poop (diarrhea) and it:  Gets worse.  Does not go away.  You have blood or something green (bile) in your throw-up.  You have blood in your poop (stool). This may cause poop to look black and tarry.  You have not peed in 6-8 hours.  You pass out (faint).  Your heart rate when you are sitting still is more than 100 beats a  minute.  You have trouble breathing. This information is not intended to replace advice given to you by your health care provider. Make sure you discuss any questions you have with your health care provider. Document Released: 11/02/2008 Document Revised: 07/27/2015 Document Reviewed: 03/02/2015 Elsevier Interactive Patient Education  2017 [employer]  

## 2016-06-11 NOTE — Progress Notes (Signed)
Michelle Erickson  [phone] Fax:([phone]) [phone]  Clinic Follow Up Note   Patient Care Team: Michelle Housekeeper, MD as PCP - General (Family Medicine) Michelle Ade, RN as Registered Nurse (Oncology) 06/11/2016   CHIEF COMPLAINTS:  Follow-up of metastatic colon cancer  Oncology History   Metastatic colon cancer to liver   Staging form: Colon and Rectum, AJCC 7th Edition     Clinical: Stage Unknown ([place], NX, M1) - Unsigned        Metastatic colon cancer to liver ([place])   07/14/2014 Imaging    CT abdomen and pelvis with contrast showed a 6 cm segment of sigmoid bowel wall thickening with adjacent 14 x 12 mm nodules, diffuse hepatic metastasis. No bowel obstruction.      07/14/2014 Initial Diagnosis    Metastatic colon cancer to liver      07/25/2014 Procedure    Colonoscopy showed a near circumferential medial mass in the sigmoid colon, partially obstructing consistent with carcinoma.      07/25/2014 Initial Biopsy    Sigmoid: Biopsy showed invasive adenocarcinoma arising in a background of high-grade dysplasia.      07/28/2014 Imaging    CT chest showed no evidence of metastasis      08/09/2014 - 01/16/2016 Chemotherapy    mFOLFOX6 every 2 weeks, dose reduction from cycle 7 due to cytopenia, Avastin added from cycle 3, stopped due to disease progression       08/11/2014 - 08/15/2014 Hospital Admission    Pt was admitted for fever and nausea, treated for UTI, liver biopsy cancelled due to the infection       10/29/2015 Imaging    CT chest, abdomen and pelvis with contrast 10/29/2015 IMPRESSION: 1. New/enlarging pulmonary nodules, most notably the 8 by 6 mm left lower lobe pulmonary nodule, favoring metastatic disease. 2. The liver lesions are subjectively grossly similar in size, difficult to measure due to the lack of IV contrast causing poor boundaries. 3. There is some faint heterogeneity of bony density in  the sternum, but this is not significantly changed from 07/28/2014 and is probably incidental given the lack of other evidence of osseous metastatic disease. 4. Lower lumbar spondylosis and degenerative disc disease causing impingement. 5. Coronary, aortic arch, and branch vessel atherosclerotic vascular disease. Aortoiliac atherosclerotic vascular disease. 6. Mildly dilated esophagus with air-fluid level, probably reflecting dysmotility. 7. Cholelithiasis. 8. Thick wall urinary bladder, cystitis not excluded.      01/28/2016 Progression    Restaging CT showed disease progression in lungs, and probably progression in liver mets also      02/14/2016 PET scan    IMPRESSION: 1. Examination is positive for hypermetabolic tumor within both lobes of liver. Index measurements provided above. 2. There are scattered pulmonary nodules within both lungs, most of these are too small to reliably characterize by PET-CT. In the left lower lobe there is a lung nodule which exhibits malignant range FDG uptake measuring 9 mm. 3. Aortic atherosclerosis and coronary artery calcifications.      02/20/2016 -  Chemotherapy    Second line chemo FOLFIRI and Avastin, every 2 weeks       03/28/2016 Imaging    CT Abdomen pelvis without contrast 03/28/2016 IMPRESSION: 1. Diffuse circumferential wall thickening throughout the colon, suggesting acute colitis. Intraluminal fluid density compatible with associated diarrheal illness. 2. Superimposed nodular area of bowel wall thickening within the sigmoid colon suspected to be related to patient's known primary adenocarcinoma. 3. Poor visualization of known hepatic  metastases. No other evidence for metastatic disease within the abdomen and pelvis. Previously identified pulmonary nodules within the left lower and right middle lobes have slightly increased in size, concerning for slightly progressive metastatic disease. 4. Cholelithiasis. 5. Diffuse  atherosclerosis with 3 vessel coronary artery calcifications.      05/26/2016 PET scan    PET 05/26/16: IMPRESSION: 1. Overall relatively stable pulmonary and liver metastases. No new sites of hypermetabolic metastatic disease. 2. Dominant hypermetabolic left lower lobe lung metastasis is mildly increased in size and stable in metabolism. Additional subcentimeter solid pulmonary nodules are below PET resolution and not convincingly changed in size. 3. Hypermetabolic liver metastases are stable in number with mixed mild metabolic changes as detailed. 4. Additional findings include aortic atherosclerosis, coronary atherosclerosis and cholelithiasis.       HISTORY OF PRESENTING ILLNESS:  Michelle Erickson 61 y.o. female is here because of abnoaml CT findings which is highly suspecious for metastatic colon cancer.   She presented intermittent nausea, anorexia and abdominal pain since Nov 2015, she has mild pain in the LUQ, crapy pain, positional (bending over),   it happened once every 2-3 weeks, and it has been more frequent in the past month. She has normal BM, but did notice the caliber of the stool is smaller than before. She denies any melena or hematochezia. She lost about 13 lbs in the past 6 months.   She presents to local emergency room in November 2015, and May 2016, was felt to be related to gastric virus. Due to the worsening symptoms lately, she went [facility] ED on 07/13/2014, CT abdomen was obtained which showed multiple large liver metastasis and probable sigmoid colon mass. She was referred to [place] for further workup. She is scheduled to see GI Dr. Olevia Perches this afternoon.  CURRENT THERAPY: second line FOLFIRI and Avastin every 2 weeks, started 02/20/2016, hold Avastin if urine protein>=300  INTERIM HISTORY: Michelle Erickson returns for follow-up. She was seen in the ED 2 days ago for worsening abdominal pain. She has had diarrhea since her last chemotherapy, with intermittent  abdominal cramps, located in the low abdomen. She was given pain medication and received IV fluids in the ED, and was discharged home. No fever or chills. Her pain has improved some, but she still has multiple episodes of abdominal pain, which lasts for several minutes to a few hours. Her appetite has been low, he eats small meals, may have not drinking enough. She denies any dizziness. She has a loose bowel movements 3-4 times a day, is taking Imodium.   MEDICAL HISTORY Past Medical History:  Diagnosis Date  . Back pain   . Diabetes mellitus without complication ([place])   . Family history of colon cancer   . Hyperlipemia   . Hypertension   . Metastatic colon cancer to liver ([place]) 07/21/2014  . Snoring   . Wears glasses    contacts/glasses    SURGICAL HISTORY: Past Surgical History:  Procedure Laterality Date  . CATARACT EXTRACTION Bilateral   . MOUTH SURGERY    . PORTACATH PLACEMENT N/A 08/03/2014   Procedure: INSERTION PORT-A-CATH;  Surgeon: Stark Klein, MD;  Location: [facility] [place];  Service: General;  Laterality: N/A;    SOCIAL HISTORY: Social History   Social History  . Marital status: Married    Spouse name: Charlotte Crumb  . Number of children: 2  . Years of education: N/A   Occupational History  . Not on file.   Social History Main Topics  .  Smoking status: Former Smoker    Packs/day: 1.00    Years: 6.00    Types: Cigarettes    Quit date: 01/21/1976  . Smokeless tobacco: Never Used  . Alcohol use No  . Drug use: No  . Sexual activity: Yes    Birth control/ protection: Post-menopausal     Comment: # 2 pregnancies and #2 live births   Other Topics Concern  . Not on file   Social History Narrative   Married, husband [name]   Has #2 adult children   Previously worked at [employer] HISTORY: Family History  Problem Relation Age of Onset  . COPD Father   . Heart attack Father   . Arthritis Sister   . Diabetes Sister   . Colon cancer Sister        dx  over 18  . Colon polyps Sister   . Seizures Brother   . Colon cancer Sister        dx in her early 58s, died in her early to mid 25s  . Diabetes Mother     ALLERGIES:  has No Known Allergies.  MEDICATIONS:  Current Outpatient Prescriptions  Medication Sig Dispense Refill  . amLODipine (NORVASC) 10 MG tablet Take 10 mg by mouth every morning.     Marland Kitchen atorvastatin (LIPITOR) 40 MG tablet Take 40 mg by mouth every evening.    . diphenoxylate-atropine (LOMOTIL) 2.5-0.025 MG tablet Take 1-8 tablets by mouth 4 (four) times daily as needed for diarrhea or loose stools. 60 tablet 1  . ferrous sulfate 325 (65 FE) MG tablet Take 325 mg by mouth daily with breakfast.    . Insulin Glargine (LANTUS SOLOSTAR) 100 UNIT/ML Solostar Pen Inject 20 Units into the skin daily at 10 pm.     . insulin lispro (HUMALOG) 100 UNIT/ML injection Inject 4-8 Units into the skin 2 (two) times daily as needed for high blood sugar. Sliding scale    . lidocaine-prilocaine (EMLA) cream Apply 1 application topically once.     . promethazine (PHENERGAN) 25 MG tablet Take 0.5-1 tablets (12.5-25 mg total) by mouth every 8 (eight) hours as needed for nausea or vomiting. 10 tablet 0  . ondansetron (ZOFRAN-ODT) 8 MG disintegrating tablet Take 8 mg by mouth every 8 (eight) hours as needed for nausea or vomiting. Reported on 05/02/2015    . traMADol (ULTRAM) 50 MG tablet Take 1 tablet (50 mg total) by mouth every 6 (six) hours as needed. 30 tablet 1   No current facility-administered medications for this visit.    Facility-Administered Medications Ordered in Other Visits  Medication Dose Route Frequency Provider Last Rate Last Dose  . sodium chloride 0.9 % injection 10 mL  10 mL Intravenous PRN Truitt Merle, MD   10 mL at 05/14/16 0753    REVIEW OF SYSTEMS:   Constitutional: Denies fevers, chills or abnormal night sweats. Eyes: Denies blurriness of vision, double vision or watery eyes Ears, nose, mouth, throat, and face: Denies  mucositis or sore throat Respiratory: Denies cough, dyspnea or wheezes Cardiovascular: Denies palpitation, chest discomfort Gastrointestinal:  Denies heartburn (+) Nausea and 1 episode of emesis Skin: Denies abnormal skin rashes Lymphatics: Denies new lymphadenopathy or easy bruising Neurological:Denies numbness, tingling or new weaknesses Behavioral/Psych: Mood is stable, no new changes  All other systems were reviewed with the patient and are negative.  PHYSICAL EXAMINATION: ECOG PERFORMANCE STATUS: 0  BP (!) 129/52 (BP Location: Right Arm, Patient Position: Sitting)  Pulse 87   Temp 99 F (37.2 C) (Oral)   Resp 18   Ht _0  (1.626 m)   Wt 147 lb 3.2 oz (66.8 kg)   SpO2 100%   BMI 25.27 kg/m   GENERAL:alert, no distress and comfortable. SKIN: skin color, texture, turgor are normal, no rashes or significant lesions EYES: normal, conjunctiva are pink and non-injected, sclera clear OROPHARYNX:no exudate, no erythema and lips, buccal mucosa, and tongue normal  NECK: supple, thyroid normal size, non-tender, without nodularity LYMPH:  no palpable lymphadenopathy in the cervical, axillary or inguinal LUNGS: clear to auscultation and percussion with normal breathing effort HEART: regular rate & rhythm and no murmurs and no lower extremity edema ABDOMEN:abdomen soft, non-tender and normal bowel sounds Musculoskeletal:no cyanosis of digits and no clubbing  PSYCH: alert & oriented x 3 with fluent speech NEURO: no focal motor/sensory deficits  LABORATORY DATA:  I have reviewed the data as listed CBC Latest Ref Rng & Units 06/11/2016 06/08/2016 05/28/2016  WBC 3.9 - 10.3 10e3/uL 4.3 2.9(L) 3.6(L)  Hemoglobin 11.6 - 15.9 g/dL 7.6(L) 7.7(L) 7.9(L)  Hematocrit 34.8 - 46.6 % 23.5(L) 23.2(L) 24.2(L)  Platelets 145 - 400 10e3/uL 183 177 172   CMP Latest Ref Rng & Units 06/11/2016 06/08/2016 05/28/2016  Glucose 70 - 140 mg/dl 73 85 266(H)  BUN 7.0 - 26.0 mg/dL 25.0 35(H) 20.4  Creatinine 0.6 -  1.1 mg/dL 1.9(H) 2.20(H) 1.7(H)  Sodium 136 - 145 mEq/L 135(L) 133(L) 137  Potassium 3.5 - 5.1 mEq/L 4.5 4.4 5.1  Chloride 101 - 111 mmol/L - 103 -  CO2 22 - 29 mEq/L 22 21(L) 24  Calcium 8.4 - 10.4 mg/dL 8.8 8.7(L) 9.1  Total Protein 6.4 - 8.3 g/dL 6.3(L) 6.9 6.5  Total Bilirubin 0.20 - 1.20 mg/dL 0.39 0.7 <0.22  Alkaline Phos 40 - 150 U/L 148 142(H) 172(H)  AST 5 - 34 U/L _1 ALT 0 - 55 U/L 20 23 34   CEA (0-5ng/ml) 07/21/2014: 594.5 11/23/2014: 18.2 03/07/2015: 6.6 09/19/2015: 37.9 10/29/2015: 63.7 11/28/2015: 28.99 01/02/2016: 40.81 01/28/2015: 47 03/05/16: 62.66 04/16/16: 92.49 05/14/16: 96.62 06/11/16: PENDING    PATHOLOGY REPORTS: Diagnosis 07/25/2014  Surgical [P], sigmoid - INVASIVE ADENOCARCINOMA ARISING IN A BACKGROUND OF HIGH GRADE DYSPLASIA. - SEE COMMENT.  Diagnosis 09/05/2014 Liver, biopsy - METASTATIC ADENOCARCINOMA, SEE COMMENT.   RADIOGRAPHIC STUDIES: I have personally reviewed the radiological images as listed and agreed with the findings in the report.  PET 05/26/16: IMPRESSION: 1. Overall relatively stable pulmonary and liver metastases. No new sites of hypermetabolic metastatic disease. 2. Dominant hypermetabolic left lower lobe lung metastasis is mildly increased in size and stable in metabolism. Additional subcentimeter solid pulmonary nodules are below PET resolution and not convincingly changed in size. 3. Hypermetabolic liver metastases are stable in number with mixed mild metabolic changes as detailed. 4. Additional findings include aortic atherosclerosis, coronary atherosclerosis and cholelithiasis.   PET 02/14/2016 IMPRESSION: 1. Examination is positive for hypermetabolic tumor within both lobes of liver. Index measurements provided above. 2. There are scattered pulmonary nodules within both lungs, most of these are too small to reliably characterize by PET-CT. In the left lower lobe there is a lung nodule which exhibits malignant range  FDG uptake measuring 9 mm. 3. Aortic atherosclerosis and coronary artery calcifications.  Colonoscopy 07/25/2014 Dr. Olevia Perches  ENDOSCOPIC IMPRESSION: Near circumferential medium mass was found in the sigmoid colon; multiple biopsies were performed using cold forceps, partially obstructing mass consistent with carcinoma. Placement of endoclips  and tattoo at the margins of the mass which extends from 18-25 cm from the anus  ASSESSMENT & PLAN: 61 y.o. African-American female, presented with intermittent nausea vomiting and anemia.   1. Abdominal pain and diarrhea -Likely secondary to chemotherapy, her underlying colon cancer may also contributes. -we discuss the management of diarrhea, she knows to use Imodium and Lomotil -I give her a prescription of tramadol, she'll use as needed for abdominal pain -IV fluids today  2. 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 -The natural history of metastatic colon cancer and treatment options were discussed with patient and her husband. Giving the diffuse metastasis in the liver, this is unfortunately incurable disease. I recommend systemic chemotherapy to control her metastatic disease, the goal of therapy is palliative and to prolong her life. -I previously reviewed her restaging CT scan from 01/28/2016, which showed slight disease progression in lungs, especailly the LLL lung nodule. Due to lack of CT contrast, it is difficult to evaluate the metastasis in the liver, but it appears to be progression also. -I previously discussed her PET scan findings, which showed hypermetabolic multiple liver metastasis and a few lung metastasis.  -she is on second  line chemo FOLFIRI and Avastin, tolerating well overall  -Due to her severe diarrhea from cycle 3 chemotherapy, I reduced Irinotecan dose slightly, she is tolerating much better. -Pet scan from 05/26/16 reviewed with pt and her husband. It showed stable disease overall, mild slightly increased size of one lung met, no other new lesions, will continue chemo and avastin (if urine protein <=100)  -We'll continue follow-up her CEA tumor marker monthly. -Due to her recent ED visit, abdominal pain, diarrhea, I'll hold her chemotherapy today, and restarted in 2 weeks. -We'll likely reduce her chemotherapy dose from next cycle.  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 -Her ferritin was previously normal, serum iron and saturation slightly low, TIBC low normal, I suggest her to take oral iron supplement, she tolerates well, we'll continue once daily, with orange juice or vitamin C. -Iron 03/05/2016 was 64. -Her anemia has been stable overall, hemoglobin 8.0 previously, stable overall, she is asymptomatic, we'll continue observation. Her HBG is slightly lower than last visit but she feels well and does not need a transfusion today.   4. DM, HTN -Continue follow-up with PCP - we previously discussed the potential impact of chemotherapy and premedication dexamethasone on her blood pressure and sugar,will  Monitor closely. Her BG has been well controlled lately  -Her blood pressure has been high lately, we'll repeat in the infusion room. I previously encouraged her to measure her blood pressure at home. We previously discussed Avastin can increase her blood pressure, need to be monitored closely. If SBP persistently high than 150, I'll consider adding on a new antihypertensive medication. She will follow-up with her primary care  physician also.  5. 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 Will continue monitoring renal function  -I previously encourage her to drink more fluids -We'll avoid NSAIDs and IV contrast for CT scan  in the future. -Avastin was held previously, however proteinuria was improved and we restarted Avastin. -Urine protein by dipstick was 300 on 04/30/16. Held Avastin on 04/30/16.  6. Goal of care discussion  -We previously discussed the incurable nature of her cancer, and the overall poor prognosis, especially if she does not have good response to chemotherapy or progress on chemo -The patient understands the goal of care is palliative. -I recommended DNR/DNI, she will think about it.  6. Nausea -The patient has nausea secondary from chemotherapy, overall mild and tolerable --The patient is not taking her Zofran because she describes it working too late for her. -I previously advised her to take Zofran prophylactically, or drink ginger tea.  Plan -will hold chemo today  -I give her a prescription of tramadol for abdominal pain -She knows to use Imodium and Lomotil for diarrhea -Normal saline 1 L today  All questions were answered. The patient knows to call the clinic with any problems, questions or concerns.  I spent 25 minutes counseling the patient face to face. The total time spent in the appointment was 30 minutes and more than 50% was on counseling. nts to them. This document has been checked and approved by the attending provider.    Truitt Merle, MD 06/11/2016   This document serves as a record of services personally performed by Truitt Merle, MD. It was created on her behalf by Joslyn Devon, a trained medical scribe. The creation of this record is based on the scribe's personal observations and the provider's statements to them. This document has been checked and approved by the attending provider. Marland Kitchen

## 2016-06-12 ENCOUNTER — Encounter: Payer: Self-pay | Admitting: Hematology

## 2016-06-13 ENCOUNTER — Telehealth: Payer: Self-pay | Admitting: Hematology

## 2016-06-13 NOTE — Telephone Encounter (Signed)
No los per 06/11/16 visit.

## 2016-06-23 NOTE — Progress Notes (Signed)
[facility]  [phone] [phone]  Clinic Follow Up Note   Patient Care Team: Dione Housekeeper, MD as PCP - General (Family Medicine) Tania Ade, RN as Registered Nurse (Oncology) 06/25/2016   CHIEF COMPLAINTS:  Follow-up of metastatic colon cancer  Oncology History   Metastatic colon cancer to liver   Staging form: Colon and Rectum, AJCC 7th Edition     Clinical: Stage Unknown ([place], NX, M1) - Unsigned        Metastatic colon cancer to liver ([place])   07/14/2014 Imaging    CT abdomen and pelvis with contrast showed a 6 cm segment of sigmoid bowel wall thickening with adjacent 14 x 12 mm nodules, diffuse hepatic metastasis. No bowel obstruction.      07/14/2014 Initial Diagnosis    Metastatic colon cancer to liver      07/25/2014 Procedure    Colonoscopy showed a near circumferential medial mass in the sigmoid colon, partially obstructing consistent with carcinoma.      07/25/2014 Initial Biopsy    Sigmoid: Biopsy showed invasive adenocarcinoma arising in a background of high-grade dysplasia.      07/28/2014 Imaging    CT chest showed no evidence of metastasis      08/09/2014 - 01/16/2016 Chemotherapy    mFOLFOX6 every 2 weeks, dose reduction from cycle 7 due to cytopenia, Avastin added from cycle 3, stopped due to disease progression       08/11/2014 - 08/15/2014 Hospital Admission    Pt was admitted for fever and nausea, treated for UTI, liver biopsy cancelled due to the infection       10/29/2015 Imaging    CT chest, abdomen and pelvis with contrast 10/29/2015 IMPRESSION: 1. New/enlarging pulmonary nodules, most notably the 8 by 6 mm left lower lobe pulmonary nodule, favoring metastatic disease. 2. The liver lesions are subjectively grossly similar in size, difficult to measure due to the lack of IV contrast causing poor boundaries. 3. There is some faint heterogeneity of bony density in  the sternum, but this is not significantly changed from 07/28/2014 and is probably incidental given the lack of other evidence of osseous metastatic disease. 4. Lower lumbar spondylosis and degenerative disc disease causing impingement. 5. Coronary, aortic arch, and branch vessel atherosclerotic vascular disease. Aortoiliac atherosclerotic vascular disease. 6. Mildly dilated esophagus with air-fluid level, probably reflecting dysmotility. 7. Cholelithiasis. 8. Thick wall urinary bladder, cystitis not excluded.      01/28/2016 Progression    Restaging CT showed disease progression in lungs, and probably progression in liver mets also      02/14/2016 PET scan    IMPRESSION: 1. Examination is positive for hypermetabolic tumor within both lobes of liver. Index measurements provided above. 2. There are scattered pulmonary nodules within both lungs, most of these are too small to reliably characterize by PET-CT. In the left lower lobe there is a lung nodule which exhibits malignant range FDG uptake measuring 9 mm. 3. Aortic atherosclerosis and coronary artery calcifications.      02/20/2016 -  Chemotherapy    Second line chemo FOLFIRI and Avastin, every 2 weeks       03/28/2016 Imaging    CT Abdomen pelvis without contrast 03/28/2016 IMPRESSION: 1. Diffuse circumferential wall thickening throughout the colon, suggesting acute colitis. Intraluminal fluid density compatible with associated diarrheal illness. 2. Superimposed nodular area of bowel wall thickening within the sigmoid colon suspected to be related to patient's known primary adenocarcinoma. 3. Poor visualization of known hepatic  metastases. No other evidence for metastatic disease within the abdomen and pelvis. Previously identified pulmonary nodules within the left lower and right middle lobes have slightly increased in size, concerning for slightly progressive metastatic disease. 4. Cholelithiasis. 5. Diffuse  atherosclerosis with 3 vessel coronary artery calcifications.      05/26/2016 PET scan    PET 05/26/16: IMPRESSION: 1. Overall relatively stable pulmonary and liver metastases. No new sites of hypermetabolic metastatic disease. 2. Dominant hypermetabolic left lower lobe lung metastasis is mildly increased in size and stable in metabolism. Additional subcentimeter solid pulmonary nodules are below PET resolution and not convincingly changed in size. 3. Hypermetabolic liver metastases are stable in number with mixed mild metabolic changes as detailed. 4. Additional findings include aortic atherosclerosis, coronary atherosclerosis and cholelithiasis.       HISTORY OF PRESENTING ILLNESS:  Michelle Erickson 61 y.o. female is here because of abnoaml CT findings which is highly suspecious for metastatic colon cancer.   She presented intermittent nausea, anorexia and abdominal pain since Nov 2015, she has mild pain in the LUQ, crapy pain, positional (bending over),   it happened once every 2-3 weeks, and it has been more frequent in the past month. She has normal BM, but did notice the caliber of the stool is smaller than before. She denies any melena or hematochezia. She lost about 13 lbs in the past 6 months.   She presents to local emergency room in November 2015, and May 2016, was felt to be related to gastric virus. Due to the worsening symptoms lately, she went to [facility] ED on 07/13/2014, CT abdomen was obtained which showed multiple large liver metastasis and probable sigmoid colon mass. She was referred to [place] for further workup. She is scheduled to see GI Dr. Olevia Perches this afternoon.  CURRENT THERAPY: second line FOLFIRI and Avastin every 2 weeks, started 02/20/2016, hold Avastin if urine protein>=300. Continue treatment with reduced dose starting with cycle 3  INTERIM HISTORY:  Michelle Erickson returns for follow-up. She presents to the clinic today with her husband. Her diarrhea resolved.  She takes pain medication as needed every other day. Her appetite is better. She gained 3 pounds.     MEDICAL HISTORY Past Medical History:  Diagnosis Date  . Back pain   . Diabetes mellitus without complication ([place])   . Family history of colon cancer   . Hyperlipemia   . Hypertension   . Metastatic colon cancer to liver ([place]) 07/21/2014  . Snoring   . Wears glasses    contacts/glasses    SURGICAL HISTORY: Past Surgical History:  Procedure Laterality Date  . CATARACT EXTRACTION Bilateral   . MOUTH SURGERY    . PORTACATH PLACEMENT N/A 08/03/2014   Procedure: INSERTION PORT-A-CATH;  Surgeon: Stark Klein, MD;  Location: [facility];  Service: General;  Laterality: N/A;    SOCIAL HISTORY: Social History   Social History  . Marital status: Married    Spouse name: Charlotte Crumb  . Number of children: 2  . Years of education: N/A   Occupational History  . Not on file.   Social History Main Topics  . Smoking status: Former Smoker    Packs/day: 1.00    Years: 6.00    Types: Cigarettes    Quit date: 01/21/1976  . Smokeless tobacco: Never Used  . Alcohol use No  . Drug use: No  . Sexual activity: Yes    Birth control/ protection: Post-menopausal     Comment: # 2 pregnancies and #  2 live births   Other Topics Concern  . Not on file   Social History Narrative   Married, husband [name]   Has #2 adult children   Previously worked at [employer] HISTORY: Family History  Problem Relation Age of Onset  . COPD Father   . Heart attack Father   . Arthritis Sister   . Diabetes Sister   . Colon cancer Sister        dx over 79  . Colon polyps Sister   . Seizures Brother   . Colon cancer Sister        dx in her early 36s, died in her early to mid 39s  . Diabetes Mother     ALLERGIES:  has No Known Allergies.  MEDICATIONS:  Current Outpatient Prescriptions  Medication Sig Dispense Refill  . amLODipine (NORVASC) 10 MG tablet Take 10 mg by mouth every morning.      Marland Kitchen atorvastatin (LIPITOR) 40 MG tablet Take 40 mg by mouth every evening.    . diphenoxylate-atropine (LOMOTIL) 2.5-0.025 MG tablet Take 1-8 tablets by mouth 4 (four) times daily as needed for diarrhea or loose stools. 60 tablet 1  . ferrous sulfate 325 (65 FE) MG tablet Take 325 mg by mouth daily with breakfast.    . Insulin Glargine (LANTUS SOLOSTAR) 100 UNIT/ML Solostar Pen Inject 20 Units into the skin daily at 10 pm.     . insulin lispro (HUMALOG) 100 UNIT/ML injection Inject 4-8 Units into the skin 2 (two) times daily as needed for high blood sugar. Sliding scale    . lidocaine-prilocaine (EMLA) cream Apply 1 application topically once.     . ondansetron (ZOFRAN-ODT) 8 MG disintegrating tablet Take 8 mg by mouth every 8 (eight) hours as needed for nausea or vomiting. Reported on 05/02/2015    . promethazine (PHENERGAN) 25 MG tablet Take 0.5-1 tablets (12.5-25 mg total) by mouth every 8 (eight) hours as needed for nausea or vomiting. 10 tablet 0  . traMADol (ULTRAM) 50 MG tablet Take 1 tablet (50 mg total) by mouth every 6 (six) hours as needed. 30 tablet 1   No current facility-administered medications for this visit.    Facility-Administered Medications Ordered in Other Visits  Medication Dose Route Frequency Provider Last Rate Last Dose  . sodium chloride 0.9 % injection 10 mL  10 mL Intravenous PRN Truitt Merle, MD   10 mL at 05/14/16 0753    REVIEW OF SYSTEMS:   Constitutional: Denies fevers, chills or abnormal night sweats. (+) slight weight gain  (+) good appetite Eyes: Denies blurriness of vision, double vision or watery eyes Ears, nose, mouth, throat, and face: Denies mucositis or sore throat Respiratory: Denies cough, dyspnea or wheezes Cardiovascular: Denies palpitation, chest discomfort Gastrointestinal:  Denies heartburn (+) Nausea and 1 episode of emesis Skin: Denies abnormal skin rashes Lymphatics: Denies new lymphadenopathy or easy bruising Neurological:Denies numbness,  tingling or new weaknesses Behavioral/Psych: Mood is stable, no new changes  All other systems were reviewed with the patient and are negative.  PHYSICAL EXAMINATION: ECOG PERFORMANCE STATUS: 1 BP (!) 127/56 (BP Location: Right Arm, Patient Position: Sitting)   Pulse 84   Temp 98.1 F (36.7 C) (Oral)   Resp 20   Ht '5\' 4"'$  (1.626 m)   Wt 150 lb (68 kg)   SpO2 100%   BMI 25.75 kg/m   GENERAL:alert, no distress and comfortable. SKIN: skin color, texture, turgor are normal, no rashes  or significant lesions EYES: normal, conjunctiva are pink and non-injected, sclera clear OROPHARYNX:no exudate, no erythema and lips, buccal mucosa, and tongue normal  NECK: supple, thyroid normal size, non-tender, without nodularity LYMPH:  no palpable lymphadenopathy in the cervical, axillary or inguinal LUNGS: clear to auscultation and percussion with normal breathing effort HEART: regular rate & rhythm and no murmurs and no lower extremity edema ABDOMEN:abdomen soft, non-tender and normal bowel sounds Musculoskeletal:no cyanosis of digits and no clubbing  PSYCH: alert & oriented x 3 with fluent speech NEURO: no focal motor/sensory deficits  LABORATORY DATA:  I have reviewed the data as listed CBC Latest Ref Rng & Units 06/25/2016 06/11/2016 06/08/2016  WBC 3.9 - 10.3 10e3/uL 6.1 4.3 2.9(L)  Hemoglobin 11.6 - 15.9 g/dL 7.5(L) 7.6(L) 7.7(L)  Hematocrit 34.8 - 46.6 % 23.8(L) 23.5(L) 23.2(L)  Platelets 145 - 400 10e3/uL 216 183 177   CMP Latest Ref Rng & Units 06/25/2016 06/11/2016 06/08/2016  Glucose 70 - 140 mg/dl 123 73 85  BUN 7.0 - 26.0 mg/dL 25.9 25.0 35(H)  Creatinine 0.6 - 1.1 mg/dL 1.4(H) 1.9(H) 2.20(H)  Sodium 136 - 145 mEq/L 136 135(L) 133(L)  Potassium 3.5 - 5.1 mEq/L 5.1 4.5 4.4  Chloride 101 - 111 mmol/L - - 103  CO2 22 - 29 mEq/L 23 22 21(L)  Calcium 8.4 - 10.4 mg/dL 9.1 8.8 8.7(L)  Total Protein 6.4 - 8.3 g/dL 7.1 6.3(L) 6.9  Total Bilirubin 0.20 - 1.20 mg/dL 0.29 0.39 0.7  Alkaline  Phos 40 - 150 U/L 160(H) 148 142(H)  AST 5 - 34 U/L 34 29 28  ALT 0 - 55 U/L '22 20 23   '$ CEA (0-5ng/ml) 07/21/2014: 594.5 11/23/2014: 18.2 03/07/2015: 6.6 09/19/2015: 37.9 10/29/2015: 63.7 11/28/2015: 28.99 01/02/2016: 40.81 01/28/2015: 47 03/05/16: 62.66 04/16/16: 92.49 05/14/16: 96.62 06/11/16: 1458.22    PATHOLOGY REPORTS: Diagnosis 07/25/2014  Surgical [P], sigmoid - INVASIVE ADENOCARCINOMA ARISING IN A BACKGROUND OF HIGH GRADE DYSPLASIA. - SEE COMMENT.  Diagnosis 09/05/2014 Liver, biopsy - METASTATIC ADENOCARCINOMA, SEE COMMENT.   RADIOGRAPHIC STUDIES: I have personally reviewed the radiological images as listed and agreed with the findings in the report.  PET 05/26/16: IMPRESSION: 1. Overall relatively stable pulmonary and liver metastases. No new sites of hypermetabolic metastatic disease. 2. Dominant hypermetabolic left lower lobe lung metastasis is mildly increased in size and stable in metabolism. Additional subcentimeter solid pulmonary nodules are below PET resolution and not convincingly changed in size. 3. Hypermetabolic liver metastases are stable in number with mixed mild metabolic changes as detailed. 4. Additional findings include aortic atherosclerosis, coronary atherosclerosis and cholelithiasis.   PET 02/14/2016 IMPRESSION: 1. Examination is positive for hypermetabolic tumor within both lobes of liver. Index measurements provided above. 2. There are scattered pulmonary nodules within both lungs, most of these are too small to reliably characterize by PET-CT. In the left lower lobe there is a lung nodule which exhibits malignant range FDG uptake measuring 9 mm. 3. Aortic atherosclerosis and coronary artery calcifications.  Colonoscopy 07/25/2014 Dr. Olevia Perches  ENDOSCOPIC IMPRESSION: Near circumferential medium mass was found in the sigmoid colon; multiple biopsies were performed using cold forceps, partially obstructing mass consistent with carcinoma. Placement  of endoclips and tattoo at the margins of the mass which extends from 18-25 cm from the anus  ASSESSMENT & PLAN: 61 y.o. African-American female, presented with intermittent nausea vomiting and anemia.   1. 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 -The natural history of metastatic colon cancer and treatment options were discussed with patient and her husband. Giving the diffuse metastasis in the liver, this is unfortunately incurable disease. I recommend systemic chemotherapy to control her metastatic disease, the goal of therapy is palliative and to prolong her life. -I previously reviewed her restaging CT scan from 01/28/2016, which showed slight disease progression in lungs, especailly the LLL lung nodule. Due to lack of CT contrast, it is difficult to evaluate the metastasis in the liver, but it appears to be progression also. -I previously discussed her PET scan findings, which showed hypermetabolic multiple liver metastasis and a few lung metastasis.  -she is on second line chemo FOLFIRI and Avastin, tolerating well overall  -Due to her severe diarrhea from cycle 3 chemotherapy, I previously reduced Irinotecan dose slightly, she is tolerating much better. -Pet scan from 05/26/16 reviewed with pt and her husband. It showed stable disease overall, mild slightly increased size of one lung met, no other new lesions, will continue chemo and avastin (if urine protein <=100)  -We'll continue follow-up her CEA tumor marker monthly. -chemo doses further reduced from cycle 8, due to diarrhea and abdominal pain -Labs reviewed and Hbg same as before and kidney function improved.  -Urine test came  back protein at 300 today so we will hold Avastin today -She has recovered well, we'll proceed cycle 8 FOLFIRI today with further dose reduction.  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 -Her ferritin was previously normal, serum iron and saturation slightly low, TIBC low normal, I suggest her to take oral iron supplement, she tolerates well, we'll continue once daily, with orange juice or vitamin C. -Iron 03/05/2016 was 64. -Her anemia has been stable overall, hemoglobin 8.0 previously, stable overall, she is asymptomatic, we'll continue observation. Her HBG is slightly lower than last visit but she feels well and does not need a transfusion today.    4. Abdominal pain and diarrhea -Likely secondary to chemotherapy, her underlying colon cancer may also contributes. -we discuss the management of diarrhea, she knows to use Imodium and Lomotil -She will use tramadol as needed -she has recovered well   5. DM, HTN -Continue follow-up with PCP - we previously discussed the potential impact of chemotherapy and premedication dexamethasone on her blood pressure and sugar,will  Monitor closely. Her BG has been well controlled lately  -Her blood pressure has been high lately, we'll repeat in the infusion room. I previously encouraged her to measure her blood pressure at home. We previously discussed Avastin can increase her blood pressure, need to be monitored closely. If SBP persistently high than 150, I'll consider adding on a new antihypertensive medication. She will follow-up with her primary care physician also.  6. 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 Will continue monitoring renal function  -I previously encourage her to drink more fluids -We'll avoid NSAIDs and IV contrast for CT  scan in the future. -Avastin was held previously, however proteinuria was improved and we restarted Avastin. -Urine protein by dipstick was 300 on 04/30/16. Held Avastin on 04/30/16.  7. Goal of care discussion  -  We previously discussed the incurable nature of her cancer, and the overall poor prognosis, especially if she does not have good response to chemotherapy or progress on chemo -The patient understands the goal of care is palliative. -I recommended DNR/DNI, she will think about it.  8. Nausea -The patient has nausea secondary from chemotherapy, overall mild and tolerable --The patient is not taking her Zofran because she describes it working too late for her. -I previously advised her to take Zofran prophylactically, or drink ginger tea.  Plan -Labs reviewed and adequate to continue treatment today with reduced dose; Urine test 300 today so will hold Avastin with today's treatment  -Lab, flush, f/u (lisa or me) and chemo FOLFIRI in 2 and 4 weeks   All questions were answered. The patient knows to call the clinic with any problems, questions or concerns.  I spent 25 minutes counseling the patient face to face. The total time spent in the appointment was 30 minutes and more than 50% was on counseling. nts to them. This document has been checked and approved by the attending provider.    Truitt Merle, MD 06/25/2016   This document serves as a record of services personally performed by Truitt Merle, MD. It was created on her behalf by Joslyn Devon, a trained medical scribe. The creation of this record is based on the scribe's personal observations and the provider's statements to them. This document has been checked and approved by the attending provider. Marland Kitchen

## 2016-06-25 ENCOUNTER — Ambulatory Visit (HOSPITAL_BASED_OUTPATIENT_CLINIC_OR_DEPARTMENT_OTHER): Payer: BLUE CROSS/BLUE SHIELD

## 2016-06-25 ENCOUNTER — Ambulatory Visit (HOSPITAL_BASED_OUTPATIENT_CLINIC_OR_DEPARTMENT_OTHER): Payer: BLUE CROSS/BLUE SHIELD | Admitting: Hematology

## 2016-06-25 ENCOUNTER — Telehealth: Payer: Self-pay | Admitting: Hematology

## 2016-06-25 ENCOUNTER — Other Ambulatory Visit (HOSPITAL_BASED_OUTPATIENT_CLINIC_OR_DEPARTMENT_OTHER): Payer: BLUE CROSS/BLUE SHIELD

## 2016-06-25 VITALS — BP 127/56 | HR 84 | Temp 98.1°F | Resp 20 | Ht 64.0 in | Wt 150.0 lb

## 2016-06-25 DIAGNOSIS — C787 Secondary malignant neoplasm of liver and intrahepatic bile duct: Secondary | ICD-10-CM

## 2016-06-25 DIAGNOSIS — Z452 Encounter for adjustment and management of vascular access device: Secondary | ICD-10-CM | POA: Diagnosis not present

## 2016-06-25 DIAGNOSIS — I10 Essential (primary) hypertension: Secondary | ICD-10-CM

## 2016-06-25 DIAGNOSIS — C7802 Secondary malignant neoplasm of left lung: Secondary | ICD-10-CM | POA: Diagnosis not present

## 2016-06-25 DIAGNOSIS — E119 Type 2 diabetes mellitus without complications: Secondary | ICD-10-CM

## 2016-06-25 DIAGNOSIS — Z5111 Encounter for antineoplastic chemotherapy: Secondary | ICD-10-CM | POA: Diagnosis not present

## 2016-06-25 DIAGNOSIS — C187 Malignant neoplasm of sigmoid colon: Secondary | ICD-10-CM

## 2016-06-25 DIAGNOSIS — C189 Malignant neoplasm of colon, unspecified: Secondary | ICD-10-CM

## 2016-06-25 DIAGNOSIS — R197 Diarrhea, unspecified: Secondary | ICD-10-CM | POA: Diagnosis not present

## 2016-06-25 DIAGNOSIS — N189 Chronic kidney disease, unspecified: Secondary | ICD-10-CM

## 2016-06-25 DIAGNOSIS — R109 Unspecified abdominal pain: Secondary | ICD-10-CM

## 2016-06-25 DIAGNOSIS — D630 Anemia in neoplastic disease: Secondary | ICD-10-CM | POA: Diagnosis not present

## 2016-06-25 DIAGNOSIS — Z95828 Presence of other vascular implants and grafts: Secondary | ICD-10-CM

## 2016-06-25 DIAGNOSIS — R110 Nausea: Secondary | ICD-10-CM

## 2016-06-25 LAB — COMPREHENSIVE METABOLIC PANEL
ALK PHOS: 160 U/L — AB (ref 40–150)
ALT: 22 U/L (ref 0–55)
ANION GAP: 5 meq/L (ref 3–11)
AST: 34 U/L (ref 5–34)
Albumin: 3.1 g/dL — ABNORMAL LOW (ref 3.5–5.0)
BILIRUBIN TOTAL: 0.29 mg/dL (ref 0.20–1.20)
BUN: 25.9 mg/dL (ref 7.0–26.0)
CO2: 23 meq/L (ref 22–29)
CREATININE: 1.4 mg/dL — AB (ref 0.6–1.1)
Calcium: 9.1 mg/dL (ref 8.4–10.4)
Chloride: 107 mEq/L (ref 98–109)
EGFR: 45 mL/min/{1.73_m2} — AB (ref >90)
Glucose: 123 mg/dl (ref 70–140)
Potassium: 5.1 mEq/L (ref 3.5–5.1)
Sodium: 136 mEq/L (ref 136–145)
TOTAL PROTEIN: 7.1 g/dL (ref 6.4–8.3)

## 2016-06-25 LAB — CBC WITH DIFFERENTIAL/PLATELET
BASO%: 0.8 % (ref 0.0–2.0)
Basophils Absolute: 0.1 10*3/uL (ref 0.0–0.1)
EOS ABS: 0.3 10*3/uL (ref 0.0–0.5)
EOS%: 4.1 % (ref 0.0–7.0)
HCT: 23.8 % — ABNORMAL LOW (ref 34.8–46.6)
HGB: 7.5 g/dL — ABNORMAL LOW (ref 11.6–15.9)
LYMPH#: 1 10*3/uL (ref 0.9–3.3)
LYMPH%: 16.5 % (ref 14.0–49.7)
MCH: 29.6 pg (ref 25.1–34.0)
MCHC: 31.5 g/dL (ref 31.5–36.0)
MCV: 94.1 fL (ref 79.5–101.0)
MONO#: 0.8 10*3/uL (ref 0.1–0.9)
MONO%: 12.4 % (ref 0.0–14.0)
NEUT%: 66.2 % (ref 38.4–76.8)
NEUTROS ABS: 4.1 10*3/uL (ref 1.5–6.5)
PLATELETS: 216 10*3/uL (ref 145–400)
RBC: 2.53 10*6/uL — AB (ref 3.70–5.45)
RDW: 16.9 % — ABNORMAL HIGH (ref 11.2–14.5)
WBC: 6.1 10*3/uL (ref 3.9–10.3)

## 2016-06-25 LAB — UA PROTEIN, DIPSTICK - CHCC: PROTEIN: 300 mg/dL

## 2016-06-25 MED ORDER — SODIUM CHLORIDE 0.9 % IV SOLN
400.0000 mg/m2 | Freq: Once | INTRAVENOUS | Status: AC
  Administered 2016-06-25: 720 mg via INTRAVENOUS
  Filled 2016-06-25: qty 36

## 2016-06-25 MED ORDER — PALONOSETRON HCL INJECTION 0.25 MG/5ML
INTRAVENOUS | Status: AC
  Filled 2016-06-25: qty 5

## 2016-06-25 MED ORDER — SODIUM CHLORIDE 0.9 % IV SOLN
2000.0000 mg/m2 | INTRAVENOUS | Status: DC
  Administered 2016-06-25: 3600 mg via INTRAVENOUS
  Filled 2016-06-25: qty 72

## 2016-06-25 MED ORDER — SODIUM CHLORIDE 0.9 % IV SOLN
135.0000 mg/m2 | Freq: Once | INTRAVENOUS | Status: AC
  Administered 2016-06-25: 240 mg via INTRAVENOUS
  Filled 2016-06-25: qty 10

## 2016-06-25 MED ORDER — SODIUM CHLORIDE 0.9 % IV SOLN
Freq: Once | INTRAVENOUS | Status: AC
  Administered 2016-06-25: 12:00:00 via INTRAVENOUS

## 2016-06-25 MED ORDER — DEXAMETHASONE SODIUM PHOSPHATE 10 MG/ML IJ SOLN
INTRAMUSCULAR | Status: AC
  Filled 2016-06-25: qty 1

## 2016-06-25 MED ORDER — PALONOSETRON HCL INJECTION 0.25 MG/5ML
0.2500 mg | Freq: Once | INTRAVENOUS | Status: AC
  Administered 2016-06-25: 0.25 mg via INTRAVENOUS

## 2016-06-25 MED ORDER — ATROPINE SULFATE 1 MG/ML IJ SOLN
INTRAMUSCULAR | Status: AC
  Filled 2016-06-25: qty 1

## 2016-06-25 MED ORDER — DEXAMETHASONE SODIUM PHOSPHATE 10 MG/ML IJ SOLN
10.0000 mg | Freq: Once | INTRAMUSCULAR | Status: AC
  Administered 2016-06-25: 10 mg via INTRAVENOUS

## 2016-06-25 MED ORDER — SODIUM CHLORIDE 0.9 % IJ SOLN
10.0000 mL | INTRAMUSCULAR | Status: DC | PRN
  Administered 2016-06-25: 10 mL via INTRAVENOUS
  Filled 2016-06-25: qty 10

## 2016-06-25 MED ORDER — ATROPINE SULFATE 1 MG/ML IJ SOLN
0.5000 mg | Freq: Once | INTRAMUSCULAR | Status: AC | PRN
  Administered 2016-06-25: 0.5 mg via INTRAVENOUS

## 2016-06-25 NOTE — Telephone Encounter (Signed)
Gave patient AVS and calender per 6/6 los  

## 2016-06-25 NOTE — Patient Instructions (Signed)
[facility] Discharge Instructions for Patients Receiving Chemotherapy  Today you received the following chemotherapy agents:  Irinotecan, Leucovorin, Fluorouracil   To help prevent nausea and vomiting after your treatment, we encourage you to take your nausea medication as directed.   If you develop nausea and vomiting that is not controlled by your nausea medication, call the clinic.   BELOW ARE SYMPTOMS THAT SHOULD BE REPORTED IMMEDIATELY:  *FEVER GREATER THAN 100.5 F  *CHILLS WITH OR WITHOUT FEVER  NAUSEA AND VOMITING THAT IS NOT CONTROLLED WITH YOUR NAUSEA MEDICATION  *UNUSUAL SHORTNESS OF BREATH  *UNUSUAL BRUISING OR BLEEDING  TENDERNESS IN MOUTH AND THROAT WITH OR WITHOUT PRESENCE OF ULCERS  *URINARY PROBLEMS  *BOWEL PROBLEMS  UNUSUAL RASH Items with * indicate a potential emergency and should be followed up as soon as possible.  Feel free to call the clinic you have any questions or concerns. The clinic phone number is [phone].  Please show the [facility] at check-in to the Emergency Department and triage nurse.

## 2016-06-27 ENCOUNTER — Ambulatory Visit (HOSPITAL_BASED_OUTPATIENT_CLINIC_OR_DEPARTMENT_OTHER): Payer: BLUE CROSS/BLUE SHIELD

## 2016-06-27 VITALS — BP 147/63 | HR 83 | Temp 98.2°F | Resp 18

## 2016-06-27 DIAGNOSIS — Z95828 Presence of other vascular implants and grafts: Secondary | ICD-10-CM

## 2016-06-27 DIAGNOSIS — C787 Secondary malignant neoplasm of liver and intrahepatic bile duct: Secondary | ICD-10-CM

## 2016-06-27 DIAGNOSIS — C187 Malignant neoplasm of sigmoid colon: Secondary | ICD-10-CM | POA: Diagnosis not present

## 2016-06-27 DIAGNOSIS — Z452 Encounter for adjustment and management of vascular access device: Secondary | ICD-10-CM | POA: Diagnosis not present

## 2016-06-27 DIAGNOSIS — C189 Malignant neoplasm of colon, unspecified: Secondary | ICD-10-CM

## 2016-06-27 MED ORDER — SODIUM CHLORIDE 0.9 % IJ SOLN
10.0000 mL | INTRAMUSCULAR | Status: DC | PRN
  Administered 2016-06-27: 10 mL via INTRAVENOUS
  Filled 2016-06-27: qty 10

## 2016-06-27 MED ORDER — HEPARIN SOD (PORK) LOCK FLUSH 100 UNIT/ML IV SOLN
500.0000 [IU] | Freq: Once | INTRAVENOUS | Status: AC | PRN
Start: 2016-06-27 — End: 2016-06-27
  Administered 2016-06-27: 500 [IU] via INTRAVENOUS
  Filled 2016-06-27: qty 5

## 2016-06-27 NOTE — Patient Instructions (Signed)

## 2016-06-28 ENCOUNTER — Encounter: Payer: Self-pay | Admitting: Hematology

## 2016-07-07 ENCOUNTER — Other Ambulatory Visit: Payer: Self-pay | Admitting: Hematology

## 2016-07-09 ENCOUNTER — Ambulatory Visit (HOSPITAL_BASED_OUTPATIENT_CLINIC_OR_DEPARTMENT_OTHER): Payer: BLUE CROSS/BLUE SHIELD

## 2016-07-09 ENCOUNTER — Other Ambulatory Visit (HOSPITAL_BASED_OUTPATIENT_CLINIC_OR_DEPARTMENT_OTHER): Payer: BLUE CROSS/BLUE SHIELD

## 2016-07-09 ENCOUNTER — Ambulatory Visit (HOSPITAL_BASED_OUTPATIENT_CLINIC_OR_DEPARTMENT_OTHER): Payer: BLUE CROSS/BLUE SHIELD | Admitting: Nurse Practitioner

## 2016-07-09 ENCOUNTER — Other Ambulatory Visit: Payer: BLUE CROSS/BLUE SHIELD

## 2016-07-09 ENCOUNTER — Ambulatory Visit: Payer: BLUE CROSS/BLUE SHIELD

## 2016-07-09 VITALS — BP 150/53 | HR 84 | Temp 98.2°F | Resp 18 | Ht 64.0 in | Wt 147.5 lb

## 2016-07-09 DIAGNOSIS — C7800 Secondary malignant neoplasm of unspecified lung: Secondary | ICD-10-CM | POA: Diagnosis not present

## 2016-07-09 DIAGNOSIS — D649 Anemia, unspecified: Secondary | ICD-10-CM | POA: Diagnosis not present

## 2016-07-09 DIAGNOSIS — Z5112 Encounter for antineoplastic immunotherapy: Secondary | ICD-10-CM

## 2016-07-09 DIAGNOSIS — N189 Chronic kidney disease, unspecified: Secondary | ICD-10-CM

## 2016-07-09 DIAGNOSIS — C189 Malignant neoplasm of colon, unspecified: Secondary | ICD-10-CM

## 2016-07-09 DIAGNOSIS — C787 Secondary malignant neoplasm of liver and intrahepatic bile duct: Secondary | ICD-10-CM

## 2016-07-09 DIAGNOSIS — Z452 Encounter for adjustment and management of vascular access device: Secondary | ICD-10-CM

## 2016-07-09 DIAGNOSIS — Z5111 Encounter for antineoplastic chemotherapy: Secondary | ICD-10-CM

## 2016-07-09 DIAGNOSIS — I10 Essential (primary) hypertension: Secondary | ICD-10-CM

## 2016-07-09 DIAGNOSIS — R197 Diarrhea, unspecified: Secondary | ICD-10-CM | POA: Diagnosis not present

## 2016-07-09 DIAGNOSIS — E119 Type 2 diabetes mellitus without complications: Secondary | ICD-10-CM

## 2016-07-09 DIAGNOSIS — C187 Malignant neoplasm of sigmoid colon: Secondary | ICD-10-CM

## 2016-07-09 DIAGNOSIS — Z95828 Presence of other vascular implants and grafts: Secondary | ICD-10-CM

## 2016-07-09 LAB — CBC WITH DIFFERENTIAL/PLATELET
BASO%: 0.5 % (ref 0.0–2.0)
Basophils Absolute: 0 10*3/uL (ref 0.0–0.1)
EOS ABS: 0.2 10*3/uL (ref 0.0–0.5)
EOS%: 3.6 % (ref 0.0–7.0)
HCT: 21.9 % — ABNORMAL LOW (ref 34.8–46.6)
HEMOGLOBIN: 7 g/dL — AB (ref 11.6–15.9)
LYMPH%: 21.2 % (ref 14.0–49.7)
MCH: 29.8 pg (ref 25.1–34.0)
MCHC: 32.1 g/dL (ref 31.5–36.0)
MCV: 93 fL (ref 79.5–101.0)
MONO#: 0.5 10*3/uL (ref 0.1–0.9)
MONO%: 12.6 % (ref 0.0–14.0)
NEUT%: 62.1 % (ref 38.4–76.8)
NEUTROS ABS: 2.6 10*3/uL (ref 1.5–6.5)
Platelets: 164 10*3/uL (ref 145–400)
RBC: 2.35 10*6/uL — AB (ref 3.70–5.45)
RDW: 17.1 % — ABNORMAL HIGH (ref 11.2–14.5)
WBC: 4.2 10*3/uL (ref 3.9–10.3)
lymph#: 0.9 10*3/uL (ref 0.9–3.3)

## 2016-07-09 LAB — COMPREHENSIVE METABOLIC PANEL
ALBUMIN: 3 g/dL — AB (ref 3.5–5.0)
ALT: 33 U/L (ref 0–55)
AST: 29 U/L (ref 5–34)
Alkaline Phosphatase: 155 U/L — ABNORMAL HIGH (ref 40–150)
Anion Gap: 9 mEq/L (ref 3–11)
BILIRUBIN TOTAL: 0.27 mg/dL (ref 0.20–1.20)
BUN: 27.5 mg/dL — AB (ref 7.0–26.0)
CO2: 22 meq/L (ref 22–29)
CREATININE: 1.5 mg/dL — AB (ref 0.6–1.1)
Calcium: 9 mg/dL (ref 8.4–10.4)
Chloride: 109 mEq/L (ref 98–109)
EGFR: 42 mL/min/{1.73_m2} — AB (ref >90)
Glucose: 193 mg/dl — ABNORMAL HIGH (ref 70–140)
Potassium: 4.8 mEq/L (ref 3.5–5.1)
SODIUM: 140 meq/L (ref 136–145)
TOTAL PROTEIN: 6.6 g/dL (ref 6.4–8.3)

## 2016-07-09 LAB — CEA (IN HOUSE-CHCC): CEA (CHCC-IN HOUSE): 180.53 ng/mL — AB (ref 0.00–5.00)

## 2016-07-09 LAB — UA PROTEIN, DIPSTICK - CHCC: Protein, ur: 100 mg/dL

## 2016-07-09 MED ORDER — DEXAMETHASONE SODIUM PHOSPHATE 10 MG/ML IJ SOLN
10.0000 mg | Freq: Once | INTRAMUSCULAR | Status: AC
  Administered 2016-07-09: 10 mg via INTRAVENOUS

## 2016-07-09 MED ORDER — SODIUM CHLORIDE 0.9 % IV SOLN
5.0000 mg/kg | Freq: Once | INTRAVENOUS | Status: AC
  Administered 2016-07-09: 350 mg via INTRAVENOUS
  Filled 2016-07-09: qty 14

## 2016-07-09 MED ORDER — SODIUM CHLORIDE 0.9 % IV SOLN
2000.0000 mg/m2 | INTRAVENOUS | Status: DC
  Administered 2016-07-09: 3600 mg via INTRAVENOUS
  Filled 2016-07-09: qty 72

## 2016-07-09 MED ORDER — ATROPINE SULFATE 1 MG/ML IJ SOLN
0.5000 mg | Freq: Once | INTRAMUSCULAR | Status: AC | PRN
  Administered 2016-07-09: 0.5 mg via INTRAVENOUS

## 2016-07-09 MED ORDER — PALONOSETRON HCL INJECTION 0.25 MG/5ML
0.2500 mg | Freq: Once | INTRAVENOUS | Status: AC
  Administered 2016-07-09: 0.25 mg via INTRAVENOUS

## 2016-07-09 MED ORDER — SODIUM CHLORIDE 0.9 % IV SOLN
Freq: Once | INTRAVENOUS | Status: AC
  Administered 2016-07-09: 13:00:00 via INTRAVENOUS

## 2016-07-09 MED ORDER — SODIUM CHLORIDE 0.9 % IJ SOLN
10.0000 mL | INTRAMUSCULAR | Status: DC | PRN
  Administered 2016-07-09: 10 mL via INTRAVENOUS
  Filled 2016-07-09: qty 10

## 2016-07-09 MED ORDER — ATROPINE SULFATE 1 MG/ML IJ SOLN
INTRAMUSCULAR | Status: AC
  Filled 2016-07-09: qty 1

## 2016-07-09 MED ORDER — PALONOSETRON HCL INJECTION 0.25 MG/5ML
INTRAVENOUS | Status: AC
  Filled 2016-07-09: qty 5

## 2016-07-09 MED ORDER — DEXAMETHASONE SODIUM PHOSPHATE 10 MG/ML IJ SOLN
INTRAMUSCULAR | Status: AC
  Filled 2016-07-09: qty 1

## 2016-07-09 MED ORDER — SODIUM CHLORIDE 0.9 % IV SOLN
400.0000 mg/m2 | Freq: Once | INTRAVENOUS | Status: AC
  Administered 2016-07-09: 720 mg via INTRAVENOUS
  Filled 2016-07-09: qty 36

## 2016-07-09 MED ORDER — IRINOTECAN HCL CHEMO INJECTION 100 MG/5ML
137.0000 mg/m2 | Freq: Once | INTRAVENOUS | Status: AC
  Administered 2016-07-09: 240 mg via INTRAVENOUS
  Filled 2016-07-09: qty 10

## 2016-07-09 NOTE — Patient Instructions (Signed)
[facility] Discharge Instructions for Patients Receiving Chemotherapy  Today you received the following chemotherapy agents:  Avastin, Leucovorin, Irinotecan, Fluorouracil  To help prevent nausea and vomiting after your treatment, we encourage you to take your nausea medication as prescribed.   If you develop nausea and vomiting that is not controlled by your nausea medication, call the clinic.   BELOW ARE SYMPTOMS THAT SHOULD BE REPORTED IMMEDIATELY:  *FEVER GREATER THAN 100.5 F  *CHILLS WITH OR WITHOUT FEVER  NAUSEA AND VOMITING THAT IS NOT CONTROLLED WITH YOUR NAUSEA MEDICATION  *UNUSUAL SHORTNESS OF BREATH  *UNUSUAL BRUISING OR BLEEDING  TENDERNESS IN MOUTH AND THROAT WITH OR WITHOUT PRESENCE OF ULCERS  *URINARY PROBLEMS  *BOWEL PROBLEMS  UNUSUAL RASH Items with * indicate a potential emergency and should be followed up as soon as possible.  Feel free to call the clinic you have any questions or concerns. The clinic phone number is [phone].  Please show the [facility] [place] at check-in to the Emergency Department and triage nurse.   Anemia, Nonspecific Anemia is a condition in which the concentration of red blood cells or hemoglobin in the blood is below normal. Hemoglobin is a substance in red blood cells that carries oxygen to the tissues of the body. Anemia results in not enough oxygen reaching these tissues. What are the causes? Common causes of anemia include:  Excessive bleeding. Bleeding may be internal or external. This includes excessive bleeding from periods (in women) or from the intestine.  Poor nutrition.  Chronic kidney, thyroid, and liver disease.  Bone marrow disorders that decrease red blood cell production.  Cancer and treatments for cancer.  HIV, AIDS, and their treatments.  Spleen problems that increase red blood cell destruction.  Blood disorders.  Excess destruction of red blood cells due to infection, medicines,  and autoimmune disorders.  What are the signs or symptoms?  Minor weakness.  Dizziness.  Headache.  Palpitations.  Shortness of breath, especially with exercise.  Paleness.  Cold sensitivity.  Indigestion.  Nausea.  Difficulty sleeping.  Difficulty concentrating. Symptoms may occur suddenly or they may develop slowly. How is this diagnosed? Additional blood tests are often needed. These help your health care provider determine the best treatment. Your health care provider will check your stool for blood and look for other causes of blood loss. How is this treated? Treatment varies depending on the cause of the anemia. Treatment can include:  Supplements of iron, vitamin K09, or folic acid.  Hormone medicines.  A blood transfusion. This may be needed if blood loss is severe.  Hospitalization. This may be needed if there is significant continual blood loss.  Dietary changes.  Spleen removal.  Follow these instructions at home: Keep all follow-up appointments. It often takes many weeks to correct anemia, and having your health care provider check on your condition and your response to treatment is very important. Get help right away if:  You develop extreme weakness, shortness of breath, or chest pain.  You become dizzy or have trouble concentrating.  You develop heavy vaginal bleeding.  You develop a rash.  You have bloody or black, tarry stools.  You faint.  You vomit up blood.  You vomit repeatedly.  You have abdominal pain.  You have a fever or persistent symptoms for more than 2-3 days.  You have a fever and your symptoms suddenly get worse.  You are dehydrated. This information is not intended to replace advice given to you by your  health care provider. Make sure you discuss any questions you have with your health care provider. Document Released: 02/14/2004 Document Revised: 06/20/2015 Document Reviewed: 07/02/2012 Elsevier Interactive  Patient Education  2017 [employer].

## 2016-07-09 NOTE — Progress Notes (Signed)
  [place] OFFICE PROGRESS NOTE   Diagnosis:  Colon cancer  INTERVAL HISTORY:   Ms. Engelken returns as scheduled. She completed cycle 8 FOLFIRI 06/25/2016. Avastin was held due to the urine protein level.  She has occasional mild nausea/vomiting. No mouth sores. No significant problem with diarrhea. She occasionally notes a small amount of blood if she strains for a bowel movement. No other bleeding. She denies shortness of breath. No chest pain. No leg swelling or calf pain. She denies abdominal pain. Overall good energy level.  Objective:  Vital signs in last 24 hours:  Blood pressure (!) 150/53, pulse 84, temperature 98.2 F (36.8 C), resp. rate 18, height '5\' 4"'$  (1.626 m), weight 147 lb 8 oz (66.9 kg), SpO2 100 %.    HEENT: No thrush or ulcers. Resp: Lungs clear bilaterally. Cardio: Regular rate and rhythm. GI: Abdomen soft and nontender. No hepatomegaly. Vascular: No leg edema. Neuro: Alert and oriented.   Port-A-Cath without erythema.  Lab Results:  Lab Results  Component Value Date   WBC 4.2 07/09/2016   HGB 7.0 (L) 07/09/2016   HCT 21.9 (L) 07/09/2016   MCV 93.0 07/09/2016   PLT 164 07/09/2016   NEUTROABS 2.6 07/09/2016    Imaging:  No results found.  Medications: I have reviewed the patient's current medications.  Assessment/Plan: 1. 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 Avastin held on 06/25/2016 due to the urine protein level. 2. Anemia likely secondary to GI blood loss, iron deficiency, chemotherapy, renal dysfunction. The hemoglobin continues to trend downward. She remains asymptomatic and declines red cell transfusion support. 3. Abdominal pain and diarrhea. She denies both at today's visit. 4. Diabetes mellitus, hypertension. 5. Chronic kidney disease, proteinuria related to  Avastin.   Disposition: Ms. Spampinato appears stable. She has completed 8 cycles of FOLFIRI/Avastin. Avastin was held with cycle 8 due to the urine protein level. Plan to proceed with FOLFIRI today as scheduled. Avastin pending urine protein result.  She has progressive anemia. We reviewed that this is likely multifactorial. She does not feel she needs a blood transfusion. We discussed the benefits and risks associated with a blood transfusion. She would like to consider this information. At present she declines red cell transfusion support. She is agreeable to return for a follow-up CBC in one week. Signs/symptoms suggestive of progressive anemia were reviewed with her and her husband at today's visit.  She will return for a lab appointment in one week. Follow-up visit and chemotherapy in 2 weeks. She will contact the office in the interim as outlined above or with any other problems.  Plan reviewed with Dr. Benay Spice.    Ned Card ANP/GNP-BC   07/09/2016  11:25 AM

## 2016-07-09 NOTE — Progress Notes (Signed)
OK for treatment today with hgb 7.0 , crt 1.5, urine protein 100 per Ned Card, NP, per Dr Burr Medico

## 2016-07-11 ENCOUNTER — Ambulatory Visit (HOSPITAL_BASED_OUTPATIENT_CLINIC_OR_DEPARTMENT_OTHER): Payer: BLUE CROSS/BLUE SHIELD

## 2016-07-11 VITALS — BP 140/62 | HR 90 | Temp 98.7°F | Resp 18

## 2016-07-11 DIAGNOSIS — Z95828 Presence of other vascular implants and grafts: Secondary | ICD-10-CM

## 2016-07-11 DIAGNOSIS — C787 Secondary malignant neoplasm of liver and intrahepatic bile duct: Secondary | ICD-10-CM

## 2016-07-11 DIAGNOSIS — C189 Malignant neoplasm of colon, unspecified: Secondary | ICD-10-CM

## 2016-07-11 DIAGNOSIS — Z452 Encounter for adjustment and management of vascular access device: Secondary | ICD-10-CM

## 2016-07-11 DIAGNOSIS — C187 Malignant neoplasm of sigmoid colon: Secondary | ICD-10-CM | POA: Diagnosis not present

## 2016-07-11 MED ORDER — HEPARIN SOD (PORK) LOCK FLUSH 100 UNIT/ML IV SOLN
500.0000 [IU] | Freq: Once | INTRAVENOUS | Status: AC | PRN
  Administered 2016-07-11: 500 [IU] via INTRAVENOUS
  Filled 2016-07-11: qty 5

## 2016-07-11 MED ORDER — SODIUM CHLORIDE 0.9 % IJ SOLN
10.0000 mL | INTRAMUSCULAR | Status: DC | PRN
  Administered 2016-07-11: 10 mL via INTRAVENOUS
  Filled 2016-07-11: qty 10

## 2016-07-11 NOTE — Patient Instructions (Signed)

## 2016-07-16 ENCOUNTER — Other Ambulatory Visit (HOSPITAL_BASED_OUTPATIENT_CLINIC_OR_DEPARTMENT_OTHER): Payer: BLUE CROSS/BLUE SHIELD

## 2016-07-16 ENCOUNTER — Telehealth: Payer: Self-pay | Admitting: *Deleted

## 2016-07-16 DIAGNOSIS — C187 Malignant neoplasm of sigmoid colon: Secondary | ICD-10-CM | POA: Diagnosis not present

## 2016-07-16 DIAGNOSIS — C189 Malignant neoplasm of colon, unspecified: Secondary | ICD-10-CM

## 2016-07-16 DIAGNOSIS — C787 Secondary malignant neoplasm of liver and intrahepatic bile duct: Principal | ICD-10-CM

## 2016-07-16 LAB — CBC WITH DIFFERENTIAL/PLATELET
BASO%: 0.4 % (ref 0.0–2.0)
Basophils Absolute: 0 10*3/uL (ref 0.0–0.1)
EOS ABS: 0.1 10*3/uL (ref 0.0–0.5)
EOS%: 3.9 % (ref 0.0–7.0)
HCT: 24.4 % — ABNORMAL LOW (ref 34.8–46.6)
HEMOGLOBIN: 7.7 g/dL — AB (ref 11.6–15.9)
LYMPH%: 27 % (ref 14.0–49.7)
MCH: 29.3 pg (ref 25.1–34.0)
MCHC: 31.6 g/dL (ref 31.5–36.0)
MCV: 92.8 fL (ref 79.5–101.0)
MONO#: 0.2 10*3/uL (ref 0.1–0.9)
MONO%: 7.8 % (ref 0.0–14.0)
NEUT#: 1.7 10*3/uL (ref 1.5–6.5)
NEUT%: 60.9 % (ref 38.4–76.8)
PLATELETS: 115 10*3/uL — AB (ref 145–400)
RBC: 2.63 10*6/uL — ABNORMAL LOW (ref 3.70–5.45)
RDW: 16 % — AB (ref 11.2–14.5)
WBC: 2.8 10*3/uL — ABNORMAL LOW (ref 3.9–10.3)
lymph#: 0.8 10*3/uL — ABNORMAL LOW (ref 0.9–3.3)

## 2016-07-16 NOTE — Telephone Encounter (Signed)
"  The person in the lab told me not to cut this blue arm band off until I talked with a nurse.  I just talked with someone about today's lab work which has improved and I feel fine.  Can I cut this off now?"  NO.  Do not remove the band.  Blood on hold for 72 hours.  Call us if you feel S.O.B., your heart beating through your chest, chest pain or increased fatigue.  we can schedule blood transfusion with this lab draw through Saturday.  Do not remove/cut the blue band off until after Saturday when we are sure no changes or symptoms of low blood counts.

## 2016-07-16 NOTE — Telephone Encounter (Signed)
Pt came for lab only today.   Hgb 7.7 today.   Spoke with pt at home and was informed that pt is feeling fine.  Denied shortness of breath, denied pain, denied  extreme fatigue, denied tachycardia.   Stated eating and drinking fine.  Asked if pt felt needing blood transfusion, pt stated NO.    Pt does have HOLD CLOT sample available.   Instructed pt to call back early am if pt experiences any above symptoms.   Pt voiced understanding.

## 2016-07-16 NOTE — Telephone Encounter (Signed)
Telephone to patient to check status and relay lab results from today. Patient states she "feels fine" and refuses any blood products right now. She understands to call this office with any concerns or questions in the interm.

## 2016-07-22 NOTE — Progress Notes (Signed)
[place]  [phone] [phone]  Clinic Follow Up Note   Patient Care Team: Dione Housekeeper, MD as PCP - General (Family Medicine) Tania Ade, RN as Registered Nurse (Oncology) 07/24/2016   CHIEF COMPLAINTS:  Follow-up of metastatic colon cancer    Oncology History   Metastatic colon cancer to liver   Staging form: Colon and Rectum, AJCC 7th Edition     Clinical: Stage Unknown ([place], NX, M1) - Unsigned        Metastatic colon cancer to liver ([place])   07/14/2014 Imaging    CT abdomen and pelvis with contrast showed a 6 cm segment of sigmoid bowel wall thickening with adjacent 14 x 12 mm nodules, diffuse hepatic metastasis. No bowel obstruction.      07/14/2014 Initial Diagnosis    Metastatic colon cancer to liver      07/25/2014 Procedure    Colonoscopy showed a near circumferential medial mass in the sigmoid colon, partially obstructing consistent with carcinoma.      07/25/2014 Initial Biopsy    Sigmoid: Biopsy showed invasive adenocarcinoma arising in a background of high-grade dysplasia.      07/28/2014 Imaging    CT chest showed no evidence of metastasis      08/09/2014 - 01/16/2016 Chemotherapy    mFOLFOX6 every 2 weeks, dose reduction from cycle 7 due to cytopenia, Avastin added from cycle 3, stopped due to disease progression       08/11/2014 - 08/15/2014 Hospital Admission    Pt was admitted for fever and nausea, treated for UTI, liver biopsy cancelled due to the infection       10/29/2015 Imaging    CT chest, abdomen and pelvis with contrast 10/29/2015 IMPRESSION: 1. New/enlarging pulmonary nodules, most notably the 8 by 6 mm left lower lobe pulmonary nodule, favoring metastatic disease. 2. The liver lesions are subjectively grossly similar in size, difficult to measure due to the lack of IV contrast causing poor boundaries. 3. There is some faint heterogeneity of bony density in  the sternum, but this is not significantly changed from 07/28/2014 and is probably incidental given the lack of other evidence of osseous metastatic disease. 4. Lower lumbar spondylosis and degenerative disc disease causing impingement. 5. Coronary, aortic arch, and branch vessel atherosclerotic vascular disease. Aortoiliac atherosclerotic vascular disease. 6. Mildly dilated esophagus with air-fluid level, probably reflecting dysmotility. 7. Cholelithiasis. 8. Thick wall urinary bladder, cystitis not excluded.      01/28/2016 Progression    Restaging CT showed disease progression in lungs, and probably progression in liver mets also      02/14/2016 PET scan    IMPRESSION: 1. Examination is positive for hypermetabolic tumor within both lobes of liver. Index measurements provided above. 2. There are scattered pulmonary nodules within both lungs, most of these are too small to reliably characterize by PET-CT. In the left lower lobe there is a lung nodule which exhibits malignant range FDG uptake measuring 9 mm. 3. Aortic atherosclerosis and coronary artery calcifications.      02/20/2016 -  Chemotherapy    Second line chemo FOLFIRI and Avastin, every 2 weeks       03/28/2016 Imaging    CT Abdomen pelvis without contrast 03/28/2016 IMPRESSION: 1. Diffuse circumferential wall thickening throughout the colon, suggesting acute colitis. Intraluminal fluid density compatible with associated diarrheal illness. 2. Superimposed nodular area of bowel wall thickening within the sigmoid colon suspected to be related to patient's known primary adenocarcinoma. 3. Poor visualization of  known hepatic metastases. No other evidence for metastatic disease within the abdomen and pelvis. Previously identified pulmonary nodules within the left lower and right middle lobes have slightly increased in size, concerning for slightly progressive metastatic disease. 4. Cholelithiasis. 5. Diffuse  atherosclerosis with 3 vessel coronary artery calcifications.      05/26/2016 PET scan    PET 05/26/16: IMPRESSION: 1. Overall relatively stable pulmonary and liver metastases. No new sites of hypermetabolic metastatic disease. 2. Dominant hypermetabolic left lower lobe lung metastasis is mildly increased in size and stable in metabolism. Additional subcentimeter solid pulmonary nodules are below PET resolution and not convincingly changed in size. 3. Hypermetabolic liver metastases are stable in number with mixed mild metabolic changes as detailed. 4. Additional findings include aortic atherosclerosis, coronary atherosclerosis and cholelithiasis.      06/08/2016 -  Hospital Admission    Patient presents to the ER due to nausea and dehydration        HISTORY OF PRESENTING ILLNESS:  Michelle Erickson 61 y.o. female is here because of abnoaml CT findings which is highly suspecious for metastatic colon cancer.   She presented intermittent nausea, anorexia and abdominal pain since Nov 2015, she has mild pain in the LUQ, crapy pain, positional (bending over),   it happened once every 2-3 weeks, and it has been more frequent in the past month. She has normal BM, but did notice the caliber of the stool is smaller than before. She denies any melena or hematochezia. She lost about 13 lbs in the past 6 months.   She presents to local emergency room in November 2015, and May 2016, was felt to be related to gastric virus. Due to the worsening symptoms lately, she went to [facility] ED on 07/13/2014, CT abdomen was obtained which showed multiple large liver metastasis and probable sigmoid colon mass. She was referred to [place] for further workup. She is scheduled to see GI Dr. Olevia Perches this afternoon.  CURRENT THERAPY: second line FOLFIRI and Avastin every 2 weeks, started 02/20/2016, reduced dose starting with cycle 3, will hold Avastin due to proteinuria and CKD from 07/24/2016  INTERIM HISTORY:    [name] returns for follow-up and treatment. She has not been feeling well for the past few days. She has stomach pain, vomiting and nausea. She is taking Tramadol. Her appetite and energy level is low.     MEDICAL HISTORY Past Medical History:  Diagnosis Date  . Back pain   . Diabetes mellitus without complication ([place])   . Family history of colon cancer   . Hyperlipemia   . Hypertension   . Metastatic colon cancer to liver ([place]) 07/21/2014  . Snoring   . Wears glasses    contacts/glasses    SURGICAL HISTORY: Past Surgical History:  Procedure Laterality Date  . CATARACT EXTRACTION Bilateral   . MOUTH SURGERY    . PORTACATH PLACEMENT N/A 08/03/2014   Procedure: INSERTION PORT-A-CATH;  Surgeon: Stark Klein, MD;  Location: [facility];  Service: General;  Laterality: N/A;    SOCIAL HISTORY: Social History   Social History  . Marital status: Married    Spouse name: Charlotte Crumb  . Number of children: 2  . Years of education: N/A   Occupational History  . Not on file.   Social History Main Topics  . Smoking status: Former Smoker    Packs/day: 1.00    Years: 6.00    Types: Cigarettes    Quit date: 01/21/1976  . Smokeless tobacco: Never Used  .  Alcohol use No  . Drug use: No  . Sexual activity: Yes    Birth control/ protection: Post-menopausal     Comment: # 2 pregnancies and #2 live births   Other Topics Concern  . Not on file   Social History Narrative   Married, husband [name]   Has #2 adult children   Previously worked at [employer] HISTORY: Family History  Problem Relation Age of Onset  . COPD Father   . Heart attack Father   . Arthritis Sister   . Diabetes Sister   . Colon cancer Sister        dx over 23  . Colon polyps Sister   . Seizures Brother   . Colon cancer Sister        dx in her early 15s, died in her early to mid 29s  . Diabetes Mother     ALLERGIES:  has No Known Allergies.  MEDICATIONS:  Current Outpatient Prescriptions   Medication Sig Dispense Refill  . amLODipine (NORVASC) 10 MG tablet Take 10 mg by mouth every morning.     Marland Kitchen atorvastatin (LIPITOR) 40 MG tablet Take 40 mg by mouth every evening.    . diphenoxylate-atropine (LOMOTIL) 2.5-0.025 MG tablet Take 1-8 tablets by mouth 4 (four) times daily as needed for diarrhea or loose stools. (Patient not taking: Reported on 07/09/2016) 60 tablet 1  . ferrous sulfate 325 (65 FE) MG tablet Take 325 mg by mouth daily with breakfast.    . Insulin Glargine (LANTUS SOLOSTAR) 100 UNIT/ML Solostar Pen Inject 20 Units into the skin daily at 10 pm.     . insulin lispro (HUMALOG) 100 UNIT/ML injection Inject 4-8 Units into the skin 2 (two) times daily as needed for high blood sugar. Sliding scale    . lidocaine-prilocaine (EMLA) cream Apply 1 application topically once.     . ondansetron (ZOFRAN-ODT) 8 MG disintegrating tablet Take 8 mg by mouth every 8 (eight) hours as needed for nausea or vomiting. Reported on 05/02/2015    . promethazine (PHENERGAN) 25 MG tablet Take 0.5-1 tablets (12.5-25 mg total) by mouth every 8 (eight) hours as needed for nausea or vomiting. 60 tablet 2  . traMADol (ULTRAM) 50 MG tablet Take 1 tablet (50 mg total) by mouth every 6 (six) hours as needed. 30 tablet 1   No current facility-administered medications for this visit.    Facility-Administered Medications Ordered in Other Visits  Medication Dose Route Frequency Provider Last Rate Last Dose  . sodium chloride 0.9 % injection 10 mL  10 mL Intravenous PRN Truitt Merle, MD   10 mL at 05/14/16 0753    REVIEW OF SYSTEMS:   Constitutional: Denies fevers, chills or abnormal night sweats. (+) fatigue (+)poor appetite (+)vomiting Eyes: Denies blurriness of vision, double vision or watery eyes Ears, nose, mouth, throat, and face: Denies mucositis or sore throat Respiratory: Denies cough, dyspnea or wheezes Cardiovascular: Denies palpitation, chest discomfort Gastrointestinal:  Denies heartburn (+)  Nausea  Skin: Denies abnormal skin rashes Lymphatics: Denies new lymphadenopathy or easy bruising Neurological:Denies numbness, tingling or new weaknesses Behavioral/Psych: Mood is stable, no new changes  All other systems were reviewed with the patient and are negative.  PHYSICAL EXAMINATION: ECOG PERFORMANCE STATUS: 1 BP 138/71 (BP Location: Right Arm, Patient Position: Sitting)   Pulse 86   Temp 98.5 F (36.9 C) (Oral)   Resp 18   Ht '5\' 4"'$  (1.626 m)   Wt 143 lb  12.8 oz (65.2 kg)   SpO2 100%   BMI 24.68 kg/m   GENERAL:alert, no distress and comfortable. SKIN: skin color, texture, turgor are normal, no rashes or significant lesions EYES: normal, conjunctiva are pink and non-injected, sclera clear OROPHARYNX:no exudate, no erythema and lips, buccal mucosa, and tongue normal  NECK: supple, thyroid normal size, non-tender, without nodularity LYMPH:  no palpable lymphadenopathy in the cervical, axillary or inguinal LUNGS: clear to auscultation and percussion with normal breathing effort HEART: regular rate & rhythm and no murmurs and no lower extremity edema ABDOMEN:abdomen soft, non-tender and normal bowel sounds Musculoskeletal:no cyanosis of digits and no clubbing  PSYCH: alert & oriented x 3 with fluent speech NEURO: no focal motor/sensory deficits  LABORATORY DATA:  I have reviewed the data as listed CBC Latest Ref Rng & Units 07/24/2016 07/16/2016 07/09/2016  WBC 3.9 - 10.3 10e3/uL 5.7 2.8(L) 4.2  Hemoglobin 11.6 - 15.9 g/dL 7.6(L) 7.7(L) 7.0(L)  Hematocrit 34.8 - 46.6 % 23.0(L) 24.4(L) 21.9(L)  Platelets 145 - 400 10e3/uL 194 115(L) 164   CMP Latest Ref Rng & Units 07/24/2016 07/09/2016 06/25/2016  Glucose 70 - 140 mg/dl 115 193(H) 123  BUN 7.0 - 26.0 mg/dL 27.3(H) 27.5(H) 25.9  Creatinine 0.6 - 1.1 mg/dL 1.7(H) 1.5(H) 1.4(H)  Sodium 136 - 145 mEq/L 136 140 136  Potassium 3.5 - 5.1 mEq/L 4.6 4.8 5.1  Chloride 101 - 111 mmol/L - - -  CO2 22 - 29 mEq/L '23 22 23  '$ Calcium 8.4 -  10.4 mg/dL 9.1 9.0 9.1  Total Protein 6.4 - 8.3 g/dL 6.9 6.6 7.1  Total Bilirubin 0.20 - 1.20 mg/dL 0.30 0.27 0.29  Alkaline Phos 40 - 150 U/L 178(H) 155(H) 160(H)  AST 5 - 34 U/L 28 29 34  ALT 0 - 55 U/L 29 33 22   CEA (0-5ng/ml) 07/21/2014: 594.5 11/23/2014: 18.2 03/07/2015: 6.6 09/19/2015: 37.9 10/29/2015: 63.7 11/28/2015: 28.99 01/02/2016: 40.81 01/28/2015: 47 03/05/16: 62.66 04/16/16: 92.49 05/14/16: 96.62 06/11/16: 145.13 07/09/2016: 180.53   PATHOLOGY REPORTS: Diagnosis 07/25/2014  Surgical [P], sigmoid - INVASIVE ADENOCARCINOMA ARISING IN A BACKGROUND OF HIGH GRADE DYSPLASIA. - SEE COMMENT.  Diagnosis 09/05/2014 Liver, biopsy - METASTATIC ADENOCARCINOMA, SEE COMMENT.   RADIOGRAPHIC STUDIES: I have personally reviewed the radiological images as listed and agreed with the findings in the report.  PET 05/26/16: IMPRESSION: 1. Overall relatively stable pulmonary and liver metastases. No new sites of hypermetabolic metastatic disease. 2. Dominant hypermetabolic left lower lobe lung metastasis is mildly increased in size and stable in metabolism. Additional subcentimeter solid pulmonary nodules are below PET resolution and not convincingly changed in size. 3. Hypermetabolic liver metastases are stable in number with mixed mild metabolic changes as detailed. 4. Additional findings include aortic atherosclerosis, coronary atherosclerosis and cholelithiasis.   PET 02/14/2016 IMPRESSION: 1. Examination is positive for hypermetabolic tumor within both lobes of liver. Index measurements provided above. 2. There are scattered pulmonary nodules within both lungs, most of these are too small to reliably characterize by PET-CT. In the left lower lobe there is a lung nodule which exhibits malignant range FDG uptake measuring 9 mm. 3. Aortic atherosclerosis and coronary artery calcifications.  Colonoscopy 07/25/2014 Dr. Olevia Perches  ENDOSCOPIC IMPRESSION: Near circumferential medium mass  was found in the sigmoid colon; multiple biopsies were performed using cold forceps, partially obstructing mass consistent with carcinoma. Placement of endoclips and tattoo at the margins of the mass which extends from 18-25 cm from the anus  ASSESSMENT & PLAN: 61 y.o. African-American female, presented  with intermittent nausea vomiting and anemia.   1. 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 -The natural history of metastatic colon cancer and treatment options were discussed with patient and her husband. Giving the diffuse metastasis in the liver, this is unfortunately incurable disease. I recommend systemic chemotherapy to control her metastatic disease, the goal of therapy is palliative and to prolong her life. -I previously reviewed her restaging CT scan from 01/28/2016, which showed slight disease progression in lungs, especailly the LLL lung nodule. Due to lack of CT contrast, it is difficult to evaluate the metastasis in the liver, but it appears to be progression also. -I previously discussed her PET scan findings, which showed hypermetabolic multiple liver metastasis and a few lung metastasis.  -she is on second line chemo FOLFIRI and Avastin, tolerating well overall  -Due to her severe diarrhea from cycle 3 chemotherapy, I previously reduced Irinotecan dose slightly, she is tolerating much better. -Pet scan from 05/26/16 reviewed with pt and her husband. It showed stable disease overall, mild slightly increased size of one lung met, no other new lesions, will continue chemo.  -Due to her persistent proteinuria and started worsening renal function, I will hold Avastin permanently from  07/24/2016 -We'll continue follow-up her CEA tumor marker monthly. - previous chemo doses further reduced from cycle 8, due to diarrhea and abdominal pain -she is not feeling well today, we will hold treatment today. Restart 7/18 -She will have PET scan in August - I encouraged her to drink more water due to kidney functioning. 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 -Her ferritin was previously normal, serum iron and saturation slightly low, TIBC low normal, I suggest her to take oral iron supplement, she tolerates well, we'll continue once daily, with orange juice or vitamin C. -Iron 03/05/2016 was 64 Her HBG is 7.6 today. I suggested 1 unit blood transfusion, she wants to wait for now - will continue monitoring   4. Abdominal pain and diarrhea -Likely secondary to chemotherapy, her underlying colon cancer may also contributes. -we discuss the management of diarrhea, she knows to use Imodium and Lomotil -She will use tramadol as needed -she has recovered well   5. DM, HTN -Continue follow-up with PCP - we previously discussed the potential impact of chemotherapy and premedication dexamethasone on her blood pressure and sugar,will  Monitor closely. Her BG has been well controlled lately  -Her blood pressure has been high lately, we'll repeat in the infusion room. I previously encouraged her to measure her blood pressure at home. We previously discussed Avastin can increase her blood pressure, need to be monitored closely. If SBP persistently high than 150, I'll consider adding on a new antihypertensive medication. She will follow-up with her primary care physician also.  6. 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 Will continue  monitoring renal function  -I previously encourage her to drink more fluids -We'll avoid NSAIDs and IV contrast for CT scan in the future. -Avastin was held  previously, however proteinuria was improved and we restarted Avastin. -Urine protein by dipstick was 300 on 04/30/16. Will hold Avastin   7. Goal of care discussion  -We previously discussed the incurable nature of her cancer, and the overall poor prognosis, especially if she does not have good response to chemotherapy or progress on chemo -The patient understands the goal of care is palliative. -I recommended DNR/DNI, she will think about it.  8. Nausea -The patient has nausea secondary from chemotherapy, overall mild and tolerable --The patient is not taking her Zofran because she describes it working too late for her. -I previously advised her to take Zofran prophylactically, or drink ginger tea. - I will refill Phenergan today  Plan - hold chemo treatment today. Restart 7/18, we'll stop her Avastin due to her proteinuria and worsening renal function. - iv fluids today -  refill phenergan - PET scan in 3 weeks, I'll see her back on July 18.  All questions were answered. The patient knows to call the clinic with any problems, questions or concerns.  I spent 25 minutes counseling the patient face to face. The total time spent in the appointment was 30 minutes and more than 50% was on counseling. nts to them. This document has been checked and approved by the attending provider.    Truitt Merle, MD 07/24/2016   This document serves as a record of services personally performed by Truitt Merle, MD. It was created on her behalf by Brandt Loosen, a trained medical scribe. The creation of this record is based on the scribe's personal observations and the provider's statements to them. This document has been checked and approved by the attending provider.

## 2016-07-24 ENCOUNTER — Ambulatory Visit: Payer: BLUE CROSS/BLUE SHIELD

## 2016-07-24 ENCOUNTER — Ambulatory Visit (HOSPITAL_BASED_OUTPATIENT_CLINIC_OR_DEPARTMENT_OTHER): Payer: BLUE CROSS/BLUE SHIELD

## 2016-07-24 ENCOUNTER — Other Ambulatory Visit (HOSPITAL_BASED_OUTPATIENT_CLINIC_OR_DEPARTMENT_OTHER): Payer: BLUE CROSS/BLUE SHIELD

## 2016-07-24 ENCOUNTER — Ambulatory Visit (HOSPITAL_BASED_OUTPATIENT_CLINIC_OR_DEPARTMENT_OTHER): Payer: BLUE CROSS/BLUE SHIELD | Admitting: Hematology

## 2016-07-24 ENCOUNTER — Telehealth: Payer: Self-pay | Admitting: Hematology

## 2016-07-24 VITALS — BP 140/57 | HR 83 | Resp 20

## 2016-07-24 VITALS — BP 138/71 | HR 86 | Temp 98.5°F | Resp 18 | Ht 64.0 in | Wt 143.8 lb

## 2016-07-24 DIAGNOSIS — E119 Type 2 diabetes mellitus without complications: Secondary | ICD-10-CM

## 2016-07-24 DIAGNOSIS — R197 Diarrhea, unspecified: Secondary | ICD-10-CM

## 2016-07-24 DIAGNOSIS — N189 Chronic kidney disease, unspecified: Secondary | ICD-10-CM | POA: Diagnosis not present

## 2016-07-24 DIAGNOSIS — D630 Anemia in neoplastic disease: Secondary | ICD-10-CM

## 2016-07-24 DIAGNOSIS — R110 Nausea: Secondary | ICD-10-CM | POA: Diagnosis not present

## 2016-07-24 DIAGNOSIS — C787 Secondary malignant neoplasm of liver and intrahepatic bile duct: Principal | ICD-10-CM

## 2016-07-24 DIAGNOSIS — C189 Malignant neoplasm of colon, unspecified: Secondary | ICD-10-CM

## 2016-07-24 DIAGNOSIS — C187 Malignant neoplasm of sigmoid colon: Secondary | ICD-10-CM

## 2016-07-24 DIAGNOSIS — Z95828 Presence of other vascular implants and grafts: Secondary | ICD-10-CM

## 2016-07-24 DIAGNOSIS — I10 Essential (primary) hypertension: Secondary | ICD-10-CM

## 2016-07-24 DIAGNOSIS — C7802 Secondary malignant neoplasm of left lung: Secondary | ICD-10-CM

## 2016-07-24 DIAGNOSIS — R109 Unspecified abdominal pain: Secondary | ICD-10-CM | POA: Diagnosis not present

## 2016-07-24 LAB — COMPREHENSIVE METABOLIC PANEL
ALT: 29 U/L (ref 0–55)
AST: 28 U/L (ref 5–34)
Albumin: 2.9 g/dL — ABNORMAL LOW (ref 3.5–5.0)
Alkaline Phosphatase: 178 U/L — ABNORMAL HIGH (ref 40–150)
Anion Gap: 9 mEq/L (ref 3–11)
BILIRUBIN TOTAL: 0.3 mg/dL (ref 0.20–1.20)
BUN: 27.3 mg/dL — AB (ref 7.0–26.0)
CO2: 23 meq/L (ref 22–29)
Calcium: 9.1 mg/dL (ref 8.4–10.4)
Chloride: 105 mEq/L (ref 98–109)
Creatinine: 1.7 mg/dL — ABNORMAL HIGH (ref 0.6–1.1)
EGFR: 36 mL/min/{1.73_m2} — AB (ref >90)
GLUCOSE: 115 mg/dL (ref 70–140)
Potassium: 4.6 mEq/L (ref 3.5–5.1)
SODIUM: 136 meq/L (ref 136–145)
TOTAL PROTEIN: 6.9 g/dL (ref 6.4–8.3)

## 2016-07-24 LAB — CBC WITH DIFFERENTIAL/PLATELET
BASO%: 0.5 % (ref 0.0–2.0)
Basophils Absolute: 0 10*3/uL (ref 0.0–0.1)
EOS ABS: 0.3 10*3/uL (ref 0.0–0.5)
EOS%: 4.5 % (ref 0.0–7.0)
HCT: 23 % — ABNORMAL LOW (ref 34.8–46.6)
HGB: 7.6 g/dL — ABNORMAL LOW (ref 11.6–15.9)
LYMPH%: 21.1 % (ref 14.0–49.7)
MCH: 30.1 pg (ref 25.1–34.0)
MCHC: 32.9 g/dL (ref 31.5–36.0)
MCV: 91.5 fL (ref 79.5–101.0)
MONO#: 1.5 10*3/uL — ABNORMAL HIGH (ref 0.1–0.9)
MONO%: 25.9 % — AB (ref 0.0–14.0)
NEUT%: 48 % (ref 38.4–76.8)
NEUTROS ABS: 2.7 10*3/uL (ref 1.5–6.5)
Platelets: 194 10*3/uL (ref 145–400)
RBC: 2.51 10*6/uL — AB (ref 3.70–5.45)
RDW: 17 % — AB (ref 11.2–14.5)
WBC: 5.7 10*3/uL (ref 3.9–10.3)
lymph#: 1.2 10*3/uL (ref 0.9–3.3)

## 2016-07-24 LAB — IRON AND TIBC
%SAT: 10 % — ABNORMAL LOW (ref 21–57)
IRON: 21 ug/dL — AB (ref 41–142)
TIBC: 220 ug/dL — AB (ref 236–444)
UIBC: 199 ug/dL (ref 120–384)

## 2016-07-24 LAB — FERRITIN: Ferritin: 331 ng/ml — ABNORMAL HIGH (ref 9–269)

## 2016-07-24 MED ORDER — SODIUM CHLORIDE 0.9 % IJ SOLN
10.0000 mL | INTRAMUSCULAR | Status: DC | PRN
  Administered 2016-07-24: 10 mL via INTRAVENOUS
  Filled 2016-07-24: qty 10

## 2016-07-24 MED ORDER — ONDANSETRON HCL 4 MG/2ML IJ SOLN
INTRAMUSCULAR | Status: AC
Start: 2016-07-24 — End: 2016-07-24
  Filled 2016-07-24: qty 4

## 2016-07-24 MED ORDER — PROMETHAZINE HCL 25 MG PO TABS: 12.5000 mg | ORAL_TABLET | Freq: Three times a day (TID) | ORAL | 2 refills | Status: DC | PRN

## 2016-07-24 MED ORDER — SODIUM CHLORIDE 0.9 % IJ SOLN
10.0000 mL | Freq: Once | INTRAMUSCULAR | Status: AC
  Administered 2016-07-24: 10 mL
  Filled 2016-07-24: qty 10

## 2016-07-24 MED ORDER — DEXAMETHASONE SODIUM PHOSPHATE 10 MG/ML IJ SOLN
INTRAMUSCULAR | Status: AC
  Filled 2016-07-24: qty 1

## 2016-07-24 MED ORDER — DEXAMETHASONE SODIUM PHOSPHATE 100 MG/10ML IJ SOLN
10.0000 mg | Freq: Once | INTRAMUSCULAR | Status: DC
Start: 2016-07-24 — End: 2016-07-24

## 2016-07-24 MED ORDER — SODIUM CHLORIDE 0.9 % IV SOLN: Freq: Once | INTRAVENOUS | Status: DC

## 2016-07-24 MED ORDER — ONDANSETRON HCL 4 MG/2ML IJ SOLN
8.0000 mg | Freq: Once | INTRAMUSCULAR | Status: AC
  Administered 2016-07-24: 8 mg via INTRAVENOUS

## 2016-07-24 MED ORDER — HEPARIN SOD (PORK) LOCK FLUSH 100 UNIT/ML IV SOLN
500.0000 [IU] | Freq: Once | INTRAVENOUS | Status: AC | PRN
  Administered 2016-07-24: 500 [IU] via INTRAVENOUS
  Filled 2016-07-24: qty 5

## 2016-07-24 MED ORDER — SODIUM CHLORIDE 0.9 % IV SOLN
Freq: Once | INTRAVENOUS | Status: AC
  Administered 2016-07-24: 11:00:00 via INTRAVENOUS

## 2016-07-24 MED ORDER — DEXAMETHASONE SODIUM PHOSPHATE 10 MG/ML IJ SOLN
10.0000 mg | Freq: Once | INTRAMUSCULAR | Status: AC
  Administered 2016-07-24: 10 mg via INTRAVENOUS

## 2016-07-24 NOTE — Telephone Encounter (Signed)
Lab, flush Chemo (FOLFIRI different from tx plan) and follow up with Dr Burr Medico aappointments scheduled per 07/24/16 los. Per Dr Burr Medico (verbal) Regimen, no longer includes Avastin. Per Stacey/Charge/Infusion she will print a copy of the AVS report and updated schedule for patient.

## 2016-07-24 NOTE — Patient Instructions (Signed)
Dehydration, Adult Dehydration is when there is not enough fluid or water in your body. This happens when you lose more fluids than you take in. Dehydration can range from mild to very bad. It should be treated right away to keep it from getting very bad. Symptoms of mild dehydration may include:  Thirst.  Dry lips.  Slightly dry mouth.  Dry, warm skin.  Dizziness. Symptoms of moderate dehydration may include:  Very dry mouth.  Muscle cramps.  Dark pee (urine). Pee may be the color of tea.  Your body making less pee.  Your eyes making fewer tears.  Heartbeat that is uneven or faster than normal (palpitations).  Headache.  Light-headedness, especially when you stand up from sitting.  Fainting (syncope). Symptoms of very bad dehydration may include:  Changes in skin, such as: ? Cold and clammy skin. ? Blotchy (mottled) or pale skin. ? Skin that does not quickly return to normal after being lightly pinched and let go (poor skin turgor).  Changes in body fluids, such as: ? Feeling very thirsty. ? Your eyes making fewer tears. ? Not sweating when body temperature is high, such as in hot weather. ? Your body making very little pee.  Changes in vital signs, such as: ? Weak pulse. ? Pulse that is more than 100 beats a minute when you are sitting still. ? Fast breathing. ? Low blood pressure.  Other changes, such as: ? Sunken eyes. ? Cold hands and feet. ? Confusion. ? Lack of energy (lethargy). ? Trouble waking up from sleep. ? Short-term weight loss. ? Unconsciousness. Follow these instructions at home:  If told by your doctor, drink an ORS: ? Make an ORS by using instructions on the package. ? Start by drinking small amounts, about  cup (120 mL) every 5-10 minutes. ? Slowly drink more until you have had the amount that your doctor said to have.  Drink enough clear fluid to keep your pee clear or pale yellow. If you were told to drink an ORS, finish the ORS  first, then start slowly drinking clear fluids. Drink fluids such as: ? Water. Do not drink only water by itself. Doing that can make the salt (sodium) level in your body get too low (hyponatremia). ? Ice chips. ? Fruit juice that you have added water to (diluted). ? Low-calorie sports drinks.  Avoid: ? Alcohol. ? Drinks that have a lot of sugar. These include high-calorie sports drinks, fruit juice that does not have water added, and soda. ? Caffeine. ? Foods that are greasy or have a lot of fat or sugar.  Take over-the-counter and prescription medicines only as told by your doctor.  Do not take salt tablets. Doing that can make the salt level in your body get too high (hypernatremia).  Eat foods that have minerals (electrolytes). Examples include bananas, oranges, potatoes, tomatoes, and spinach.  Keep all follow-up visits as told by your doctor. This is important. Contact a doctor if:  You have belly (abdominal) pain that: ? Gets worse. ? Stays in one area (localizes).  You have a rash.  You have a stiff neck.  You get angry or annoyed more easily than normal (irritability).  You are more sleepy than normal.  You have a harder time waking up than normal.  You feel: ? Weak. ? Dizzy. ? Very thirsty.  You have peed (urinated) only a small amount of very dark pee during 6-8 hours. Get help right away if:  You have symptoms of   very bad dehydration.  You cannot drink fluids without throwing up (vomiting).  Your symptoms get worse with treatment.  You have a fever.  You have a very bad headache.  You are throwing up or having watery poop (diarrhea) and it: ? Gets worse. ? Does not go away.  You have blood or something green (bile) in your throw-up.  You have blood in your poop (stool). This may cause poop to look black and tarry.  You have not peed in 6-8 hours.  You pass out (faint).  Your heart rate when you are sitting still is more than 100 beats a  minute.  You have trouble breathing. This information is not intended to replace advice given to you by your health care provider. Make sure you discuss any questions you have with your health care provider. Document Released: 11/02/2008 Document Revised: 07/27/2015 Document Reviewed: 03/02/2015 Elsevier Interactive Patient Education  2018 [employer]  

## 2016-07-26 ENCOUNTER — Encounter: Payer: Self-pay | Admitting: Hematology

## 2016-07-30 MED ORDER — DIPHENHYDRAMINE HCL 25 MG PO CAPS
ORAL_CAPSULE | ORAL | Status: AC
  Filled 2016-07-30: qty 2

## 2016-07-30 MED ORDER — ACETAMINOPHEN 325 MG PO TABS
ORAL_TABLET | ORAL | Status: AC
  Filled 2016-07-30: qty 2

## 2016-07-31 ENCOUNTER — Telehealth: Payer: Self-pay | Admitting: *Deleted

## 2016-07-31 NOTE — Telephone Encounter (Signed)
Pt called requesting a call back from nurse.  Attempted to call pt back with no answer.  Phone was ringing for about 5 minutes with no voice mail available.   Unable to leave message for pt to call nurse back. Pt's   Phone      [phone].

## 2016-08-01 NOTE — Progress Notes (Signed)
[place]  [phone] [phone]  Clinic Follow Up Note   Patient Care Team: Dione Housekeeper, MD as PCP - General (Family Medicine) Tania Ade, RN as Registered Nurse (Oncology) 08/06/2016   CHIEF COMPLAINTS:  Follow-up of metastatic colon cancer    Oncology History   Metastatic colon cancer to liver   Staging form: Colon and Rectum, AJCC 7th Edition     Clinical: Stage Unknown ([place], NX, M1) - Unsigned        Metastatic colon cancer to liver ([place])   07/14/2014 Imaging    CT abdomen and pelvis with contrast showed a 6 cm segment of sigmoid bowel wall thickening with adjacent 14 x 12 mm nodules, diffuse hepatic metastasis. No bowel obstruction.      07/14/2014 Initial Diagnosis    Metastatic colon cancer to liver      07/25/2014 Procedure    Colonoscopy showed a near circumferential medial mass in the sigmoid colon, partially obstructing consistent with carcinoma.      07/25/2014 Initial Biopsy    Sigmoid: Biopsy showed invasive adenocarcinoma arising in a background of high-grade dysplasia.      07/28/2014 Imaging    CT chest showed no evidence of metastasis      08/09/2014 - 01/16/2016 Chemotherapy    mFOLFOX6 every 2 weeks, dose reduction from cycle 7 due to cytopenia, Avastin added from cycle 3, stopped due to disease progression       08/11/2014 - 08/15/2014 Hospital Admission    Pt was admitted for fever and nausea, treated for UTI, liver biopsy cancelled due to the infection       10/29/2015 Imaging    CT chest, abdomen and pelvis with contrast 10/29/2015 IMPRESSION: 1. New/enlarging pulmonary nodules, most notably the 8 by 6 mm left lower lobe pulmonary nodule, favoring metastatic disease. 2. The liver lesions are subjectively grossly similar in size, difficult to measure due to the lack of IV contrast causing poor boundaries. 3. There is some faint heterogeneity of bony density  in the sternum, but this is not significantly changed from 07/28/2014 and is probably incidental given the lack of other evidence of osseous metastatic disease. 4. Lower lumbar spondylosis and degenerative disc disease causing impingement. 5. Coronary, aortic arch, and branch vessel atherosclerotic vascular disease. Aortoiliac atherosclerotic vascular disease. 6. Mildly dilated esophagus with air-fluid level, probably reflecting dysmotility. 7. Cholelithiasis. 8. Thick wall urinary bladder, cystitis not excluded.      01/28/2016 Progression    Restaging CT showed disease progression in lungs, and probably progression in liver mets also      02/14/2016 PET scan    IMPRESSION: 1. Examination is positive for hypermetabolic tumor within both lobes of liver. Index measurements provided above. 2. There are scattered pulmonary nodules within both lungs, most of these are too small to reliably characterize by PET-CT. In the left lower lobe there is a lung nodule which exhibits malignant range FDG uptake measuring 9 mm. 3. Aortic atherosclerosis and coronary artery calcifications.      02/20/2016 -  Chemotherapy    Second line chemo FOLFIRI and Avastin, every 2 weeks       03/28/2016 Imaging    CT Abdomen pelvis without contrast 03/28/2016 IMPRESSION: 1. Diffuse circumferential wall thickening throughout the colon, suggesting acute colitis. Intraluminal fluid density compatible with associated diarrheal illness. 2. Superimposed nodular area of bowel wall thickening within the sigmoid colon suspected to be related to patient's known primary adenocarcinoma. 3. Poor visualization of  known hepatic metastases. No other evidence for metastatic disease within the abdomen and pelvis. Previously identified pulmonary nodules within the left lower and right middle lobes have slightly increased in size, concerning for slightly progressive metastatic disease. 4. Cholelithiasis. 5. Diffuse  atherosclerosis with 3 vessel coronary artery calcifications.      05/26/2016 PET scan    PET 05/26/16: IMPRESSION: 1. Overall relatively stable pulmonary and liver metastases. No new sites of hypermetabolic metastatic disease. 2. Dominant hypermetabolic left lower lobe lung metastasis is mildly increased in size and stable in metabolism. Additional subcentimeter solid pulmonary nodules are below PET resolution and not convincingly changed in size. 3. Hypermetabolic liver metastases are stable in number with mixed mild metabolic changes as detailed. 4. Additional findings include aortic atherosclerosis, coronary atherosclerosis and cholelithiasis.      06/08/2016 -  Hospital Admission    Patient presents to the ER due to nausea and dehydration        HISTORY OF PRESENTING ILLNESS:  Michelle Erickson 61 y.o. female is here because of abnoaml CT findings which is highly suspecious for metastatic colon cancer.   She presented intermittent nausea, anorexia and abdominal pain since Nov 2015, she has mild pain in the LUQ, crapy pain, positional (bending over),   it happened once every 2-3 weeks, and it has been more frequent in the past month. She has normal BM, but did notice the caliber of the stool is smaller than before. She denies any melena or hematochezia. She lost about 13 lbs in the past 6 months.   She presents to local emergency room in November 2015, and May 2016, was felt to be related to gastric virus. Due to the worsening symptoms lately, she went to [facility] ED on 07/13/2014, CT abdomen was obtained which showed multiple large liver metastasis and probable sigmoid colon mass. She was referred to [place] for further workup. She is scheduled to see GI Dr. Juanda Chance this afternoon.  CURRENT THERAPY: second line FOLFIRI and Avastin every 2 weeks, started 02/20/2016, reduced dose starting with cycle 3, will hold Avastin due to proteinuria and CKD for now.    INTERIM HISTORY:  Michelle Erickson  returns for follow-up and treatment. She presents to the clinic today with her husband. Her diarrhea has resolved with normal 3 BM a day and her appetite is good. She has gained 4 pounds back. She does not report to being tired even though her Hemoglobin and iron is low. She had her last blood transfusion was she was 10.     MEDICAL HISTORY Past Medical History:  Diagnosis Date  . Back pain   . Diabetes mellitus without complication (HCC)   . Family history of colon cancer   . Hyperlipemia   . Hypertension   . Metastatic colon cancer to liver (HCC) 07/21/2014  . Snoring   . Wears glasses    contacts/glasses    SURGICAL HISTORY: Past Surgical History:  Procedure Laterality Date  . CATARACT EXTRACTION Bilateral   . MOUTH SURGERY    . PORTACATH PLACEMENT N/A 08/03/2014   Procedure: INSERTION PORT-A-CATH;  Surgeon: Almond Lint, MD;  Location: [facility] OR;  Service: General;  Laterality: N/A;    SOCIAL HISTORY: Social History   Social History  . Marital status: Married    Spouse name: Bethann Berkshire  . Number of children: 2  . Years of education: N/A   Occupational History  . Not on file.   Social History Main Topics  . Smoking status: Former Smoker  Packs/day: 1.00    Years: 6.00    Types: Cigarettes    Quit date: 01/21/1976  . Smokeless tobacco: Never Used  . Alcohol use No  . Drug use: No  . Sexual activity: Yes    Birth control/ protection: Post-menopausal     Comment: # 2 pregnancies and #2 live births   Other Topics Concern  . Not on file   Social History Narrative   Married, husband [name]   Has #2 adult children   Previously worked at [employer] HISTORY: Family History  Problem Relation Age of Onset  . COPD Father   . Heart attack Father   . Arthritis Sister   . Diabetes Sister   . Colon cancer Sister        dx over 52  . Colon polyps Sister   . Seizures Brother   . Colon cancer Sister        dx in her early 65s, died in her early to mid  6s  . Diabetes Mother     ALLERGIES:  has No Known Allergies.  MEDICATIONS:  Current Outpatient Prescriptions  Medication Sig Dispense Refill  . amLODipine (NORVASC) 10 MG tablet Take 10 mg by mouth every morning.     Marland Kitchen atorvastatin (LIPITOR) 40 MG tablet Take 40 mg by mouth every evening.    . diphenoxylate-atropine (LOMOTIL) 2.5-0.025 MG tablet Take 1-8 tablets by mouth 4 (four) times daily as needed for diarrhea or loose stools. 60 tablet 1  . ferrous sulfate 325 (65 FE) MG tablet Take 325 mg by mouth daily with breakfast.    . Insulin Glargine (LANTUS SOLOSTAR) 100 UNIT/ML Solostar Pen Inject 20 Units into the skin daily at 10 pm.     . insulin lispro (HUMALOG) 100 UNIT/ML injection Inject 4-8 Units into the skin 2 (two) times daily as needed for high blood sugar. Sliding scale    . lidocaine-prilocaine (EMLA) cream Apply 1 application topically once.     . ondansetron (ZOFRAN-ODT) 8 MG disintegrating tablet Take 8 mg by mouth every 8 (eight) hours as needed for nausea or vomiting. Reported on 05/02/2015    . promethazine (PHENERGAN) 25 MG tablet Take 0.5-1 tablets (12.5-25 mg total) by mouth every 8 (eight) hours as needed for nausea or vomiting. 60 tablet 2  . traMADol (ULTRAM) 50 MG tablet Take 1 tablet (50 mg total) by mouth every 6 (six) hours as needed. 30 tablet 1   No current facility-administered medications for this visit.    Facility-Administered Medications Ordered in Other Visits  Medication Dose Route Frequency Provider Last Rate Last Dose  . sodium chloride 0.9 % injection 10 mL  10 mL Intravenous PRN Truitt Merle, MD   10 mL at 05/14/16 0753    REVIEW OF SYSTEMS:   Constitutional: Denies fevers, chills or abnormal night sweats. (+) fatigue  (+)vomiting (+) purposeful weight gain Eyes: Denies blurriness of vision, double vision or watery eyes Ears, nose, mouth, throat, and face: Denies mucositis or sore throat Respiratory: Denies cough, dyspnea or  wheezes Cardiovascular: Denies palpitation, chest discomfort Gastrointestinal:  Denies heartburn (+) Nausea  Skin: Denies abnormal skin rashes Lymphatics: Denies new lymphadenopathy or easy bruising Neurological:Denies numbness, tingling or new weaknesses Behavioral/Psych: Mood is stable, no new changes  All other systems were reviewed with the patient and are negative.  PHYSICAL EXAMINATION: ECOG PERFORMANCE STATUS: 1 BP (!) 147/61 (BP Location: Left Arm, Patient Position: Sitting)   Pulse 79  Temp 98.2 F (36.8 C) (Oral)   Resp 18   Ht '5\' 4"'$  (1.626 m)   Wt 147 lb 14.4 oz (67.1 kg)   SpO2 100%   BMI 25.39 kg/m    GENERAL:alert, no distress and comfortable. SKIN: skin color, texture, turgor are normal, no rashes or significant lesions EYES: normal, conjunctiva are pink and non-injected, sclera clear OROPHARYNX:no exudate, no erythema and lips, buccal mucosa, and tongue normal  NECK: supple, thyroid normal size, non-tender, without nodularity LYMPH:  no palpable lymphadenopathy in the cervical, axillary or inguinal LUNGS: clear to auscultation and percussion with normal breathing effort HEART: regular rate & rhythm and no murmurs and no lower extremity edema ABDOMEN:abdomen soft, non-tender and normal bowel sounds Musculoskeletal:no cyanosis of digits and no clubbing  PSYCH: alert & oriented x 3 with fluent speech NEURO: no focal motor/sensory deficits  LABORATORY DATA:  I have reviewed the data as listed CBC Latest Ref Rng & Units 08/06/2016 07/24/2016 07/16/2016  WBC 3.9 - 10.3 10e3/uL 7.9 5.7 2.8(L)  Hemoglobin 11.6 - 15.9 g/dL 7.2(L) 7.6(L) 7.7(L)  Hematocrit 34.8 - 46.6 % 22.4(L) 23.0(L) 24.4(L)  Platelets 145 - 400 10e3/uL 219 194 115(L)   CMP Latest Ref Rng & Units 08/06/2016 07/24/2016 07/09/2016  Glucose 70 - 140 mg/dl 231(H) 115 193(H)  BUN 7.0 - 26.0 mg/dL 20.6 27.3(H) 27.5(H)  Creatinine 0.6 - 1.1 mg/dL 1.5(H) 1.7(H) 1.5(H)  Sodium 136 - 145 mEq/L 137 136 140   Potassium 3.5 - 5.1 mEq/L 5.0 4.6 4.8  Chloride 101 - 111 mmol/L - - -  CO2 22 - 29 mEq/L '23 23 22  '$ Calcium 8.4 - 10.4 mg/dL 8.9 9.1 9.0  Total Protein 6.4 - 8.3 g/dL 6.8 6.9 6.6  Total Bilirubin 0.20 - 1.20 mg/dL 0.27 0.30 0.27  Alkaline Phos 40 - 150 U/L 249(H) 178(H) 155(H)  AST 5 - 34 U/L 51(H) 28 29  ALT 0 - 55 U/L 54 29 33    CEA (0-5ng/ml) 07/21/2014: 594.5 11/23/2014: 18.2 03/07/2015: 6.6 09/19/2015: 37.9 10/29/2015: 63.7 11/28/2015: 28.99 01/02/2016: 40.81 01/28/2015: 47 03/05/16: 62.66 04/16/16: 92.49 05/14/16: 96.62 06/11/16: 145.13 07/09/2016: 180.53 08/06/16: PENDING   PATHOLOGY REPORTS: Diagnosis 07/25/2014  Surgical [P], sigmoid - INVASIVE ADENOCARCINOMA ARISING IN A BACKGROUND OF HIGH GRADE DYSPLASIA. - SEE COMMENT.  Diagnosis 09/05/2014 Liver, biopsy - METASTATIC ADENOCARCINOMA, SEE COMMENT.   RADIOGRAPHIC STUDIES: I have personally reviewed the radiological images as listed and agreed with the findings in the report.  PET 05/26/16: IMPRESSION: 1. Overall relatively stable pulmonary and liver metastases. No new sites of hypermetabolic metastatic disease. 2. Dominant hypermetabolic left lower lobe lung metastasis is mildly increased in size and stable in metabolism. Additional subcentimeter solid pulmonary nodules are below PET resolution and not convincingly changed in size. 3. Hypermetabolic liver metastases are stable in number with mixed mild metabolic changes as detailed. 4. Additional findings include aortic atherosclerosis, coronary atherosclerosis and cholelithiasis.   PET 02/14/2016 IMPRESSION: 1. Examination is positive for hypermetabolic tumor within both lobes of liver. Index measurements provided above. 2. There are scattered pulmonary nodules within both lungs, most of these are too small to reliably characterize by PET-CT. In the left lower lobe there is a lung nodule which exhibits malignant range FDG uptake measuring 9 mm. 3. Aortic  atherosclerosis and coronary artery calcifications.  Colonoscopy 07/25/2014 Dr. Olevia Perches  ENDOSCOPIC IMPRESSION: Near circumferential medium mass was found in the sigmoid colon; multiple biopsies were performed using cold forceps, partially obstructing mass consistent with carcinoma. Placement of  endoclips and tattoo at the margins of the mass which extends from 18-25 cm from the anus  ASSESSMENT & PLAN: 61 y.o. African-American female, presented with intermittent nausea vomiting and anemia.   1. 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 -The natural history of metastatic colon cancer and treatment options were discussed with patient and her husband. Giving the diffuse metastasis in the liver, this is unfortunately incurable disease. I recommend systemic chemotherapy to control her metastatic disease, the goal of therapy is palliative and to prolong her life. -I previously reviewed her restaging CT scan from 01/28/2016, which showed slight disease progression in lungs, especailly the LLL lung nodule. Due to lack of CT contrast, it is difficult to evaluate the metastasis in the liver, but it appears to be progression also. -I previously discussed her PET scan findings, which showed hypermetabolic multiple liver metastasis and a few lung metastasis.  -she is on second line chemo FOLFIRI and Avastin, tolerating well overall  -Due to her severe diarrhea from cycle 3 chemotherapy, I previously reduced Irinotecan dose slightly, she is tolerating much better. -Pet scan from 05/26/16 reviewed with pt and her husband. It showed stable disease overall, mild slightly increased size of one lung met, no other  new lesions, will continue chemo.  -Due to her persistent proteinuria and started worsening renal function, I will hold Avastin permanently from 07/24/2016 -We'll continue follow-up her CEA tumor marker monthly. - previous chemo doses further reduced from cycle 8, due to diarrhea and abdominal pain -She will have PET scan in August - I encouraged her to drink more water due to kidney functioning.  -Labs reviewed and Hb is 7.2. I highly suggest a Blood transfusion when she gets her pump DC She agrees.  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 -Her ferritin was previously normal, serum iron and saturation slightly low, TIBC low normal, I suggest her to take oral iron supplement, she tolerates well, we'll continue once daily, with orange juice or vitamin C. -Iron 03/05/2016 was 64 Her HBG is 7.6 previously. I suggested 1 unit blood transfusion, she wants to wait for now - will continue monitoring  -Due to her Hb being 7.2 today (08/06/16) I will order her a blood transfusion -I discussed her getting one dose IV iron to help keep up her levels. She agrees.   4. Abdominal pain and diarrhea -Likely secondary to chemotherapy, her underlying colon cancer may also contributes. -we discuss the management of diarrhea, she knows to use Imodium and Lomotil -She will use tramadol as needed -she has recovered well   5. DM, HTN -Continue follow-up with PCP - we previously discussed the potential impact of chemotherapy and premedication dexamethasone on her blood pressure and sugar,will  Monitor closely. Her BG has been well controlled lately  -Her blood pressure has  been high lately, we'll repeat in the infusion room. I  previously encouraged her to measure her blood pressure at home. We previously discussed Avastin can increase her blood pressure, need to be monitored closely. If SBP persistently high than 150, I'll consider adding on a new antihypertensive medication. She will follow-up with her primary care physician also.  6. 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 Will continue monitoring renal function  -I previously encourage her to drink more fluids -We'll avoid NSAIDs and IV contrast for CT scan in the future. -Avastin has been discontinued due to her persistent proteinuria and worsening renal function.  7. Goal of care discussion  -We previously discussed the incurable nature of her cancer, and the overall poor prognosis, especially if she does not have good response to chemotherapy or progress on chemo -The patient understands the goal of care is palliative. -I recommended DNR/DNI, she will think about it.  8. Nausea -The patient has nausea secondary from chemotherapy, overall mild and tolerable --The patient is not taking her Zofran because she describes it working too late for her. -I previously advised her to take Zofran prophylactically, or drink ginger tea.  Plan -Lab reviewed, we'll proceed chemotherapy  FOLFIRI with irinotecan dose reduction today.  -Lab, flush, and chemo FOLFIRI in 4 and 6 weeks -See APP in 2 weeks and me in 4 weeks -PET in 3 weeks  -Order Blood transfusion in 2 days with Tylenol and Benadryl as premedication   All questions were answered. The patient knows to call the clinic with any problems, questions or concerns.  I spent 25 minutes counseling the patient face to face. The total time spent in the appointment was 30 minutes and more than 50% was on counseling. nts to them. This document has been checked and approved by the attending provider.    Truitt Merle, MD 08/06/2016   This  document serves as a record of services personally performed by Truitt Merle, MD. It was created on her behalf by Joslyn Devon, a trained medical scribe. The creation of this record is based on the scribe's personal observations and the provider's statements to them. This document has been checked and approved by the attending provider.

## 2016-08-06 ENCOUNTER — Ambulatory Visit (HOSPITAL_BASED_OUTPATIENT_CLINIC_OR_DEPARTMENT_OTHER): Payer: BLUE CROSS/BLUE SHIELD

## 2016-08-06 ENCOUNTER — Ambulatory Visit (HOSPITAL_BASED_OUTPATIENT_CLINIC_OR_DEPARTMENT_OTHER): Payer: BLUE CROSS/BLUE SHIELD | Admitting: Hematology

## 2016-08-06 ENCOUNTER — Ambulatory Visit (HOSPITAL_COMMUNITY)
Admission: RE | Admit: 2016-08-06 | Discharge: 2016-08-06 | Disposition: A | Discharge status: Home / Self Care | Payer: BLUE CROSS/BLUE SHIELD | Source: Ambulatory Visit | Attending: Neurology | Admitting: Neurology

## 2016-08-06 ENCOUNTER — Encounter: Payer: Self-pay | Admitting: Hematology

## 2016-08-06 ENCOUNTER — Other Ambulatory Visit (HOSPITAL_BASED_OUTPATIENT_CLINIC_OR_DEPARTMENT_OTHER): Payer: BLUE CROSS/BLUE SHIELD

## 2016-08-06 ENCOUNTER — Telehealth: Payer: Self-pay | Admitting: Hematology

## 2016-08-06 VITALS — BP 147/61 | HR 79 | Temp 98.2°F | Resp 18 | Ht 64.0 in | Wt 147.9 lb

## 2016-08-06 DIAGNOSIS — Z5111 Encounter for antineoplastic chemotherapy: Secondary | ICD-10-CM

## 2016-08-06 DIAGNOSIS — E119 Type 2 diabetes mellitus without complications: Secondary | ICD-10-CM

## 2016-08-06 DIAGNOSIS — C189 Malignant neoplasm of colon, unspecified: Secondary | ICD-10-CM | POA: Insufficient documentation

## 2016-08-06 DIAGNOSIS — C7800 Secondary malignant neoplasm of unspecified lung: Secondary | ICD-10-CM | POA: Diagnosis not present

## 2016-08-06 DIAGNOSIS — G893 Neoplasm related pain (acute) (chronic): Secondary | ICD-10-CM | POA: Diagnosis not present

## 2016-08-06 DIAGNOSIS — N189 Chronic kidney disease, unspecified: Secondary | ICD-10-CM | POA: Diagnosis not present

## 2016-08-06 DIAGNOSIS — I10 Essential (primary) hypertension: Secondary | ICD-10-CM

## 2016-08-06 DIAGNOSIS — C787 Secondary malignant neoplasm of liver and intrahepatic bile duct: Secondary | ICD-10-CM | POA: Diagnosis not present

## 2016-08-06 DIAGNOSIS — C187 Malignant neoplasm of sigmoid colon: Secondary | ICD-10-CM

## 2016-08-06 DIAGNOSIS — D630 Anemia in neoplastic disease: Secondary | ICD-10-CM | POA: Diagnosis not present

## 2016-08-06 DIAGNOSIS — Z452 Encounter for adjustment and management of vascular access device: Secondary | ICD-10-CM

## 2016-08-06 DIAGNOSIS — Z95828 Presence of other vascular implants and grafts: Secondary | ICD-10-CM

## 2016-08-06 LAB — CBC WITH DIFFERENTIAL/PLATELET
BASO%: 0.9 % (ref 0.0–2.0)
BASOS ABS: 0.1 10*3/uL (ref 0.0–0.1)
EOS%: 2.3 % (ref 0.0–7.0)
Eosinophils Absolute: 0.2 10*3/uL (ref 0.0–0.5)
HCT: 22.4 % — ABNORMAL LOW (ref 34.8–46.6)
HEMOGLOBIN: 7.2 g/dL — AB (ref 11.6–15.9)
LYMPH%: 12 % — ABNORMAL LOW (ref 14.0–49.7)
MCH: 29.1 pg (ref 25.1–34.0)
MCHC: 32 g/dL (ref 31.5–36.0)
MCV: 90.9 fL (ref 79.5–101.0)
MONO#: 1 10*3/uL — ABNORMAL HIGH (ref 0.1–0.9)
MONO%: 12.3 % (ref 0.0–14.0)
NEUT#: 5.7 10*3/uL (ref 1.5–6.5)
NEUT%: 72.5 % (ref 38.4–76.8)
Platelets: 219 10*3/uL (ref 145–400)
RBC: 2.46 10*6/uL — ABNORMAL LOW (ref 3.70–5.45)
RDW: 17.6 % — ABNORMAL HIGH (ref 11.2–14.5)
WBC: 7.9 10*3/uL (ref 3.9–10.3)
lymph#: 0.9 10*3/uL (ref 0.9–3.3)

## 2016-08-06 LAB — CEA (IN HOUSE-CHCC): CEA (CHCC-In House): 372.79 ng/mL — ABNORMAL HIGH (ref 0.00–5.00)

## 2016-08-06 LAB — COMPREHENSIVE METABOLIC PANEL
ALBUMIN: 2.7 g/dL — AB (ref 3.5–5.0)
ALK PHOS: 249 U/L — AB (ref 40–150)
ALT: 54 U/L (ref 0–55)
AST: 51 U/L — AB (ref 5–34)
Anion Gap: 8 mEq/L (ref 3–11)
BUN: 20.6 mg/dL (ref 7.0–26.0)
CHLORIDE: 106 meq/L (ref 98–109)
CO2: 23 mEq/L (ref 22–29)
Calcium: 8.9 mg/dL (ref 8.4–10.4)
Creatinine: 1.5 mg/dL — ABNORMAL HIGH (ref 0.6–1.1)
EGFR: 43 mL/min/{1.73_m2} — ABNORMAL LOW (ref >90)
GLUCOSE: 231 mg/dL — AB (ref 70–140)
POTASSIUM: 5 meq/L (ref 3.5–5.1)
SODIUM: 137 meq/L (ref 136–145)
Total Bilirubin: 0.27 mg/dL (ref 0.20–1.20)
Total Protein: 6.8 g/dL (ref 6.4–8.3)

## 2016-08-06 LAB — ABO/RH: ABO/RH(D): O POS

## 2016-08-06 LAB — UA PROTEIN, DIPSTICK - CHCC: Protein, ur: 100 mg/dL

## 2016-08-06 MED ORDER — DEXAMETHASONE SODIUM PHOSPHATE 10 MG/ML IJ SOLN
INTRAMUSCULAR | Status: AC
  Filled 2016-08-06: qty 1

## 2016-08-06 MED ORDER — PALONOSETRON HCL INJECTION 0.25 MG/5ML
0.2500 mg | Freq: Once | INTRAVENOUS | Status: AC
  Administered 2016-08-06: 0.25 mg via INTRAVENOUS

## 2016-08-06 MED ORDER — SODIUM CHLORIDE 0.9 % IV SOLN
400.0000 mg/m2 | Freq: Once | INTRAVENOUS | Status: AC
  Administered 2016-08-06: 720 mg via INTRAVENOUS
  Filled 2016-08-06: qty 36

## 2016-08-06 MED ORDER — ATROPINE SULFATE 1 MG/ML IJ SOLN
INTRAMUSCULAR | Status: AC
  Filled 2016-08-06: qty 1

## 2016-08-06 MED ORDER — SODIUM CHLORIDE 0.9 % IV SOLN
Freq: Once | INTRAVENOUS | Status: AC
  Administered 2016-08-06: 12:00:00 via INTRAVENOUS

## 2016-08-06 MED ORDER — SODIUM CHLORIDE 0.9 % IV SOLN
2000.0000 mg/m2 | INTRAVENOUS | Status: DC
  Administered 2016-08-06: 3600 mg via INTRAVENOUS
  Filled 2016-08-06: qty 72

## 2016-08-06 MED ORDER — SODIUM CHLORIDE 0.9 % IJ SOLN
10.0000 mL | INTRAMUSCULAR | Status: DC | PRN
  Administered 2016-08-06: 10 mL via INTRAVENOUS
  Filled 2016-08-06: qty 10

## 2016-08-06 MED ORDER — DEXAMETHASONE SODIUM PHOSPHATE 10 MG/ML IJ SOLN
10.0000 mg | Freq: Once | INTRAMUSCULAR | Status: AC
  Administered 2016-08-06: 10 mg via INTRAVENOUS

## 2016-08-06 MED ORDER — SODIUM CHLORIDE 0.9 % IV SOLN: 5.0000 mg/kg | Freq: Once | INTRAVENOUS | Status: DC

## 2016-08-06 MED ORDER — PALONOSETRON HCL INJECTION 0.25 MG/5ML
INTRAVENOUS | Status: AC
  Filled 2016-08-06: qty 5

## 2016-08-06 MED ORDER — SODIUM CHLORIDE 0.9 % IV SOLN
240.0000 mg | Freq: Once | INTRAVENOUS | Status: AC
  Administered 2016-08-06: 240 mg via INTRAVENOUS
  Filled 2016-08-06: qty 2

## 2016-08-06 MED ORDER — ATROPINE SULFATE 1 MG/ML IJ SOLN
0.5000 mg | Freq: Once | INTRAMUSCULAR | Status: AC | PRN
  Administered 2016-08-06: 0.5 mg via INTRAVENOUS

## 2016-08-06 NOTE — Patient Instructions (Signed)
Implanted Port Home Guide An implanted port is a type of central line that is placed under the skin. Central lines are used to provide IV access when treatment or nutrition needs to be given through a person's veins. Implanted ports are used for long-term IV access. An implanted port may be placed because:  You need IV medicine that would be irritating to the small veins in your hands or arms.  You need long-term IV medicines, such as antibiotics.  You need IV nutrition for a long period.  You need frequent blood draws for lab tests.  You need dialysis.  Implanted ports are usually placed in the chest area, but they can also be placed in the upper arm, the abdomen, or the leg. An implanted port has two main parts:  Reservoir. The reservoir is round and will appear as a small, raised area under your skin. The reservoir is the part where a needle is inserted to give medicines or draw blood.  Catheter. The catheter is a thin, flexible tube that extends from the reservoir. The catheter is placed into a large vein. Medicine that is inserted into the reservoir goes into the catheter and then into the vein.  How will I care for my incision site? Do not get the incision site wet. Bathe or shower as directed by your health care provider. How is my port accessed? Special steps must be taken to access the port:  Before the port is accessed, a numbing cream can be placed on the skin. This helps numb the skin over the port site.  Your health care provider uses a sterile technique to access the port. ? Your health care provider must put on a mask and sterile gloves. ? The skin over your port is cleaned carefully with an antiseptic and allowed to dry. ? The port is gently pinched between sterile gloves, and a needle is inserted into the port.  Only "non-coring" port needles should be used to access the port. Once the port is accessed, a blood return should be checked. This helps ensure that the port  is in the vein and is not clogged.  If your port needs to remain accessed for a constant infusion, a clear (transparent) bandage will be placed over the needle site. The bandage and needle will need to be changed every week, or as directed by your health care provider.  Keep the bandage covering the needle clean and dry. Do not get it wet. Follow your health care provider's instructions on how to take a shower or bath while the port is accessed.  If your port does not need to stay accessed, no bandage is needed over the port.  What is flushing? Flushing helps keep the port from getting clogged. Follow your health care provider's instructions on how and when to flush the port. Ports are usually flushed with saline solution or a medicine called heparin. The need for flushing will depend on how the port is used.  If the port is used for intermittent medicines or blood draws, the port will need to be flushed: ? After medicines have been given. ? After blood has been drawn. ? As part of routine maintenance.  If a constant infusion is running, the port may not need to be flushed.  How long will my port stay implanted? The port can stay in for as long as your health care provider thinks it is needed. When it is time for the port to come out, surgery will be   done to remove it. The procedure is similar to the one performed when the port was put in. When should I seek immediate medical care? When you have an implanted port, you should seek immediate medical care if:  You notice a bad smell coming from the incision site.  You have swelling, redness, or drainage at the incision site.  You have more swelling or pain at the port site or the surrounding area.  You have a fever that is not controlled with medicine.  This information is not intended to replace advice given to you by your health care provider. Make sure you discuss any questions you have with your health care provider. Document  Released: 01/06/2005 Document Revised: 06/14/2015 Document Reviewed: 09/13/2012 Elsevier Interactive Patient Education  2017 Elsevier Inc.  

## 2016-08-06 NOTE — Patient Instructions (Signed)
Michelle Erickson Discharge Instructions for Patients Receiving Chemotherapy  Today you received the following chemotherapy agents Irinotecan, Leucovorin and 5 FU  To help prevent nausea and vomiting after your treatment, we encourage you to take your nausea medication as directed   If you develop nausea and vomiting that is not controlled by your nausea medication, call the clinic.   BELOW ARE SYMPTOMS THAT SHOULD BE REPORTED IMMEDIATELY:  *FEVER GREATER THAN 100.5 F  *CHILLS WITH OR WITHOUT FEVER  NAUSEA AND VOMITING THAT IS NOT CONTROLLED WITH YOUR NAUSEA MEDICATION  *UNUSUAL SHORTNESS OF BREATH  *UNUSUAL BRUISING OR BLEEDING  TENDERNESS IN MOUTH AND THROAT WITH OR WITHOUT PRESENCE OF ULCERS  *URINARY PROBLEMS  *BOWEL PROBLEMS  UNUSUAL RASH Items with * indicate a potential emergency and should be followed up as soon as possible.  Feel free to call the clinic you have any questions or concerns. The clinic phone number is [phone].  Please show the [place] at check-in to the Emergency Department and triage nurse.

## 2016-08-06 NOTE — Telephone Encounter (Signed)
Scheduled appt per 7/18 los - patient to get new schedule in the treatment area.

## 2016-08-07 ENCOUNTER — Telehealth: Payer: Self-pay | Admitting: *Deleted

## 2016-08-07 NOTE — Telephone Encounter (Signed)
Returned call to patient who reported ambulatory pump has stopped. "I heard it beeping, took it out to check for any tangles.  Must have touched something because the screen reads stopped.  What do I need to do.  I cannot come in because my husband does not get of work until 3:00 pm."  Instructed to press and hold pump start/stop button.  "Screen changed to RUN ResVol 150 ml"   Within minutes alarming heard, "screen reads high pressure". Talked through checking tubing for any kinks or twist in the tubing or clamp closures.  Straightened tubing, organized pump, IV bag and tubing in pouch successfully stopped intermittent high pressure beeping.  Screen reads Run ResVol 149 ml.

## 2016-08-08 ENCOUNTER — Ambulatory Visit: Payer: BLUE CROSS/BLUE SHIELD

## 2016-08-08 DIAGNOSIS — C787 Secondary malignant neoplasm of liver and intrahepatic bile duct: Secondary | ICD-10-CM

## 2016-08-08 DIAGNOSIS — Z95828 Presence of other vascular implants and grafts: Secondary | ICD-10-CM

## 2016-08-08 DIAGNOSIS — C189 Malignant neoplasm of colon, unspecified: Secondary | ICD-10-CM

## 2016-08-08 LAB — PREPARE RBC (CROSSMATCH)

## 2016-08-08 MED ORDER — HEPARIN SOD (PORK) LOCK FLUSH 100 UNIT/ML IV SOLN
500.0000 [IU] | Freq: Once | INTRAVENOUS | Status: DC | PRN
  Filled 2016-08-08: qty 5

## 2016-08-08 MED ORDER — SODIUM CHLORIDE 0.9 % IJ SOLN
10.0000 mL | INTRAMUSCULAR | Status: DC | PRN
  Filled 2016-08-08: qty 10

## 2016-08-08 NOTE — Progress Notes (Signed)
Pump not d/c'ed today chemo bag still not empty.  Per Dr. Burr Medico leave on until tomorrow 7/21.  Patient has appt to get blood as well.

## 2016-08-09 ENCOUNTER — Ambulatory Visit: Payer: BLUE CROSS/BLUE SHIELD

## 2016-08-09 VITALS — BP 187/91 | HR 85 | Temp 97.8°F | Resp 18

## 2016-08-09 DIAGNOSIS — C189 Malignant neoplasm of colon, unspecified: Secondary | ICD-10-CM

## 2016-08-09 DIAGNOSIS — D630 Anemia in neoplastic disease: Secondary | ICD-10-CM

## 2016-08-09 DIAGNOSIS — C787 Secondary malignant neoplasm of liver and intrahepatic bile duct: Principal | ICD-10-CM

## 2016-08-09 MED ORDER — SODIUM CHLORIDE 0.9 % IV SOLN
250.0000 mL | Freq: Once | INTRAVENOUS | Status: AC
  Administered 2016-08-09: 250 mL via INTRAVENOUS

## 2016-08-09 MED ORDER — DIPHENHYDRAMINE HCL 25 MG PO CAPS
25.0000 mg | ORAL_CAPSULE | Freq: Once | ORAL | Status: AC
  Administered 2016-08-09: 25 mg via ORAL

## 2016-08-09 MED ORDER — DIPHENHYDRAMINE HCL 25 MG PO CAPS
ORAL_CAPSULE | ORAL | Status: AC
  Filled 2016-08-09: qty 1

## 2016-08-09 MED ORDER — HEPARIN SOD (PORK) LOCK FLUSH 100 UNIT/ML IV SOLN
500.0000 [IU] | Freq: Once | INTRAVENOUS | Status: AC
  Administered 2016-08-09: 500 [IU]
  Filled 2016-08-09: qty 5

## 2016-08-09 MED ORDER — SODIUM CHLORIDE 0.9% FLUSH
10.0000 mL | Freq: Once | INTRAVENOUS | Status: AC
  Administered 2016-08-09: 10 mL
  Filled 2016-08-09: qty 10

## 2016-08-09 MED ORDER — ACETAMINOPHEN 325 MG PO TABS
650.0000 mg | ORAL_TABLET | Freq: Once | ORAL | Status: AC
  Administered 2016-08-09: 650 mg via ORAL

## 2016-08-09 MED ORDER — HEPARIN SOD (PORK) LOCK FLUSH 100 UNIT/ML IV SOLN
500.0000 [IU] | Freq: Once | INTRAVENOUS | Status: DC | PRN
  Filled 2016-08-09: qty 5

## 2016-08-09 MED ORDER — SODIUM CHLORIDE 0.9% FLUSH
10.0000 mL | INTRAVENOUS | Status: DC | PRN
  Filled 2016-08-09: qty 10

## 2016-08-09 NOTE — Patient Instructions (Signed)

## 2016-08-11 ENCOUNTER — Inpatient Hospital Stay (HOSPITAL_COMMUNITY)
Admission: EM | Admit: 2016-08-11 | Discharge: 2016-08-15 | DRG: 392 | Disposition: A | Discharge status: Home Health | Payer: BLUE CROSS/BLUE SHIELD | Attending: Internal Medicine | Admitting: Internal Medicine

## 2016-08-11 ENCOUNTER — Encounter (HOSPITAL_COMMUNITY): Payer: Self-pay | Admitting: Emergency Medicine

## 2016-08-11 DIAGNOSIS — Z833 Family history of diabetes mellitus: Secondary | ICD-10-CM

## 2016-08-11 DIAGNOSIS — R112 Nausea with vomiting, unspecified: Principal | ICD-10-CM | POA: Diagnosis present

## 2016-08-11 DIAGNOSIS — Z8249 Family history of ischemic heart disease and other diseases of the circulatory system: Secondary | ICD-10-CM

## 2016-08-11 DIAGNOSIS — C787 Secondary malignant neoplasm of liver and intrahepatic bile duct: Secondary | ICD-10-CM | POA: Diagnosis present

## 2016-08-11 DIAGNOSIS — X58XXXA Exposure to other specified factors, initial encounter: Secondary | ICD-10-CM | POA: Diagnosis present

## 2016-08-11 DIAGNOSIS — D6481 Anemia due to antineoplastic chemotherapy: Secondary | ICD-10-CM | POA: Diagnosis present

## 2016-08-11 DIAGNOSIS — Z800 Family history of malignant neoplasm of digestive organs: Secondary | ICD-10-CM

## 2016-08-11 DIAGNOSIS — N183 Chronic kidney disease, stage 3 (moderate): Secondary | ICD-10-CM | POA: Diagnosis present

## 2016-08-11 DIAGNOSIS — T451X5A Adverse effect of antineoplastic and immunosuppressive drugs, initial encounter: Secondary | ICD-10-CM | POA: Diagnosis present

## 2016-08-11 DIAGNOSIS — D701 Agranulocytosis secondary to cancer chemotherapy: Secondary | ICD-10-CM | POA: Diagnosis present

## 2016-08-11 DIAGNOSIS — Z87891 Personal history of nicotine dependence: Secondary | ICD-10-CM

## 2016-08-11 DIAGNOSIS — I129 Hypertensive chronic kidney disease with stage 1 through stage 4 chronic kidney disease, or unspecified chronic kidney disease: Secondary | ICD-10-CM | POA: Diagnosis present

## 2016-08-11 DIAGNOSIS — C7800 Secondary malignant neoplasm of unspecified lung: Secondary | ICD-10-CM | POA: Diagnosis present

## 2016-08-11 DIAGNOSIS — R634 Abnormal weight loss: Secondary | ICD-10-CM | POA: Diagnosis present

## 2016-08-11 DIAGNOSIS — R52 Pain, unspecified: Secondary | ICD-10-CM

## 2016-08-11 DIAGNOSIS — E785 Hyperlipidemia, unspecified: Secondary | ICD-10-CM | POA: Diagnosis present

## 2016-08-11 DIAGNOSIS — F5089 Other specified eating disorder: Secondary | ICD-10-CM

## 2016-08-11 DIAGNOSIS — N390 Urinary tract infection, site not specified: Secondary | ICD-10-CM | POA: Diagnosis present

## 2016-08-11 DIAGNOSIS — E119 Type 2 diabetes mellitus without complications: Secondary | ICD-10-CM

## 2016-08-11 DIAGNOSIS — C189 Malignant neoplasm of colon, unspecified: Secondary | ICD-10-CM | POA: Diagnosis present

## 2016-08-11 DIAGNOSIS — R8271 Bacteriuria: Secondary | ICD-10-CM | POA: Diagnosis present

## 2016-08-11 DIAGNOSIS — R630 Anorexia: Secondary | ICD-10-CM | POA: Diagnosis present

## 2016-08-11 DIAGNOSIS — E1122 Type 2 diabetes mellitus with diabetic chronic kidney disease: Secondary | ICD-10-CM | POA: Diagnosis present

## 2016-08-11 LAB — TYPE AND SCREEN
ABO/RH(D): O POS
Antibody Screen: NEGATIVE
UNIT DIVISION: 0
Unit division: 0

## 2016-08-11 LAB — BPAM RBC
BLOOD PRODUCT EXPIRATION DATE: 201808112359
BLOOD PRODUCT EXPIRATION DATE: 201808112359
ISSUE DATE / TIME: 201807211012
ISSUE DATE / TIME: 201807211012
UNIT TYPE AND RH: 5100
Unit Type and Rh: 5100

## 2016-08-11 MED ORDER — ONDANSETRON 4 MG PO TBDP
4.0000 mg | ORAL_TABLET | Freq: Once | ORAL | Status: AC | PRN
  Administered 2016-08-14: 4 mg via ORAL
  Filled 2016-08-11: qty 1

## 2016-08-11 NOTE — ED Triage Notes (Addendum)
Pt reports having vomiting and abd pain for the last day. Pt reports having blood transfusion on 08/09/16. Pt reporting 4 episodes of emesis today. Pt is active chemo pt for colon cancer. Last treatment 08/06/16

## 2016-08-12 ENCOUNTER — Other Ambulatory Visit: Payer: Self-pay

## 2016-08-12 ENCOUNTER — Encounter (HOSPITAL_COMMUNITY): Payer: Self-pay | Admitting: Emergency Medicine

## 2016-08-12 ENCOUNTER — Telehealth: Payer: Self-pay | Admitting: *Deleted

## 2016-08-12 ENCOUNTER — Emergency Department (HOSPITAL_COMMUNITY): Payer: BLUE CROSS/BLUE SHIELD

## 2016-08-12 DIAGNOSIS — D630 Anemia in neoplastic disease: Secondary | ICD-10-CM | POA: Diagnosis not present

## 2016-08-12 DIAGNOSIS — N390 Urinary tract infection, site not specified: Secondary | ICD-10-CM | POA: Diagnosis present

## 2016-08-12 DIAGNOSIS — R112 Nausea with vomiting, unspecified: Principal | ICD-10-CM

## 2016-08-12 DIAGNOSIS — C787 Secondary malignant neoplasm of liver and intrahepatic bile duct: Secondary | ICD-10-CM | POA: Diagnosis present

## 2016-08-12 DIAGNOSIS — R634 Abnormal weight loss: Secondary | ICD-10-CM | POA: Diagnosis present

## 2016-08-12 DIAGNOSIS — C7800 Secondary malignant neoplasm of unspecified lung: Secondary | ICD-10-CM

## 2016-08-12 DIAGNOSIS — R630 Anorexia: Secondary | ICD-10-CM | POA: Diagnosis present

## 2016-08-12 DIAGNOSIS — F5089 Other specified eating disorder: Secondary | ICD-10-CM | POA: Diagnosis not present

## 2016-08-12 DIAGNOSIS — N183 Chronic kidney disease, stage 3 (moderate): Secondary | ICD-10-CM | POA: Diagnosis present

## 2016-08-12 DIAGNOSIS — T451X5A Adverse effect of antineoplastic and immunosuppressive drugs, initial encounter: Secondary | ICD-10-CM | POA: Diagnosis present

## 2016-08-12 DIAGNOSIS — Z8249 Family history of ischemic heart disease and other diseases of the circulatory system: Secondary | ICD-10-CM | POA: Diagnosis not present

## 2016-08-12 DIAGNOSIS — X58XXXA Exposure to other specified factors, initial encounter: Secondary | ICD-10-CM | POA: Diagnosis present

## 2016-08-12 DIAGNOSIS — Z800 Family history of malignant neoplasm of digestive organs: Secondary | ICD-10-CM | POA: Diagnosis not present

## 2016-08-12 DIAGNOSIS — Z87891 Personal history of nicotine dependence: Secondary | ICD-10-CM | POA: Diagnosis not present

## 2016-08-12 DIAGNOSIS — D701 Agranulocytosis secondary to cancer chemotherapy: Secondary | ICD-10-CM | POA: Diagnosis present

## 2016-08-12 DIAGNOSIS — R197 Diarrhea, unspecified: Secondary | ICD-10-CM | POA: Diagnosis not present

## 2016-08-12 DIAGNOSIS — E1122 Type 2 diabetes mellitus with diabetic chronic kidney disease: Secondary | ICD-10-CM | POA: Diagnosis present

## 2016-08-12 DIAGNOSIS — E119 Type 2 diabetes mellitus without complications: Secondary | ICD-10-CM

## 2016-08-12 DIAGNOSIS — D6481 Anemia due to antineoplastic chemotherapy: Secondary | ICD-10-CM | POA: Diagnosis present

## 2016-08-12 DIAGNOSIS — E785 Hyperlipidemia, unspecified: Secondary | ICD-10-CM | POA: Diagnosis present

## 2016-08-12 DIAGNOSIS — C189 Malignant neoplasm of colon, unspecified: Secondary | ICD-10-CM

## 2016-08-12 DIAGNOSIS — R8271 Bacteriuria: Secondary | ICD-10-CM | POA: Diagnosis present

## 2016-08-12 DIAGNOSIS — I129 Hypertensive chronic kidney disease with stage 1 through stage 4 chronic kidney disease, or unspecified chronic kidney disease: Secondary | ICD-10-CM | POA: Diagnosis present

## 2016-08-12 DIAGNOSIS — Z833 Family history of diabetes mellitus: Secondary | ICD-10-CM | POA: Diagnosis not present

## 2016-08-12 LAB — GASTROINTESTINAL PANEL BY PCR, STOOL (REPLACES STOOL CULTURE)

## 2016-08-12 LAB — GLUCOSE, CAPILLARY
GLUCOSE-CAPILLARY: 81 mg/dL (ref 65–99)
Glucose-Capillary: 172 mg/dL — ABNORMAL HIGH (ref 65–99)
Glucose-Capillary: 103 mg/dL — ABNORMAL HIGH (ref 65–99)
Glucose-Capillary: 97 mg/dL (ref 65–99)

## 2016-08-12 LAB — CBC
HCT: 33.5 % — ABNORMAL LOW (ref 36.0–46.0)
Hemoglobin: 11.2 g/dL — ABNORMAL LOW (ref 12.0–15.0)
MCH: 29.4 pg (ref 26.0–34.0)
MCHC: 33.4 g/dL (ref 30.0–36.0)
MCV: 87.9 fL (ref 78.0–100.0)
PLATELETS: 195 10*3/uL (ref 150–400)
RBC: 3.81 MIL/uL — ABNORMAL LOW (ref 3.87–5.11)
RDW: 15.7 % — AB (ref 11.5–15.5)
WBC: 1.2 10*3/uL — CL (ref 4.0–10.5)

## 2016-08-12 LAB — URINALYSIS, ROUTINE W REFLEX MICROSCOPIC
Bilirubin Urine: NEGATIVE
Glucose, UA: NEGATIVE mg/dL
Ketones, ur: NEGATIVE mg/dL
Nitrite: NEGATIVE
Specific Gravity, Urine: 1.014 (ref 1.005–1.030)
pH: 5 (ref 5.0–8.0)

## 2016-08-12 LAB — CBG MONITORING, ED: Glucose-Capillary: 205 mg/dL — ABNORMAL HIGH (ref 65–99)

## 2016-08-12 LAB — HEPATIC FUNCTION PANEL
ALK PHOS: 227 U/L — AB (ref 38–126)
ALT: 35 U/L (ref 14–54)
AST: 31 U/L (ref 15–41)
Albumin: 3.2 g/dL — ABNORMAL LOW (ref 3.5–5.0)
BILIRUBIN INDIRECT: 0.8 mg/dL (ref 0.3–0.9)
BILIRUBIN TOTAL: 1.2 mg/dL (ref 0.3–1.2)
Bilirubin, Direct: 0.4 mg/dL (ref 0.1–0.5)
Total Protein: 7.2 g/dL (ref 6.5–8.1)

## 2016-08-12 LAB — COMPREHENSIVE METABOLIC PANEL
ALBUMIN: 3.1 g/dL — AB (ref 3.5–5.0)
ALK PHOS: 222 U/L — AB (ref 38–126)
ALT: 35 U/L (ref 14–54)
AST: 31 U/L (ref 15–41)
Anion gap: 11 (ref 5–15)
BILIRUBIN TOTAL: 1.2 mg/dL (ref 0.3–1.2)
BUN: 39 mg/dL — ABNORMAL HIGH (ref 6–20)
CALCIUM: 8.9 mg/dL (ref 8.9–10.3)
CO2: 20 mmol/L — AB (ref 22–32)
CREATININE: 2 mg/dL — AB (ref 0.44–1.00)
Chloride: 99 mmol/L — ABNORMAL LOW (ref 101–111)
GFR calc Af Amer: 29 mL/min — ABNORMAL LOW (ref >60)
GFR calc non Af Amer: 25 mL/min — ABNORMAL LOW (ref >60)
GLUCOSE: 161 mg/dL — AB (ref 65–99)
Potassium: 4.7 mmol/L (ref 3.5–5.1)
SODIUM: 130 mmol/L — AB (ref 135–145)
TOTAL PROTEIN: 7.2 g/dL (ref 6.5–8.1)

## 2016-08-12 LAB — MAGNESIUM: Magnesium: 1.7 mg/dL (ref 1.7–2.4)

## 2016-08-12 LAB — LIPASE, BLOOD: Lipase: <10 U/L — ABNORMAL LOW (ref 11–51)

## 2016-08-12 LAB — I-STAT TROPONIN, ED: Troponin i, poc: 0.01 ng/mL (ref 0.00–0.08)

## 2016-08-12 LAB — POTASSIUM: POTASSIUM: 4.7 mmol/L (ref 3.5–5.1)

## 2016-08-12 MED ORDER — ACETAMINOPHEN 325 MG PO TABS: 650.0000 mg | ORAL_TABLET | Freq: Four times a day (QID) | ORAL | Status: DC | PRN

## 2016-08-12 MED ORDER — ENOXAPARIN SODIUM 30 MG/0.3ML ~~LOC~~ SOLN: 30.0000 mg | SUBCUTANEOUS | Status: DC

## 2016-08-12 MED ORDER — MORPHINE SULFATE (PF) 2 MG/ML IV SOLN
2.0000 mg | INTRAVENOUS | Status: DC | PRN
  Administered 2016-08-13: 2 mg via INTRAVENOUS
  Filled 2016-08-12: qty 1

## 2016-08-12 MED ORDER — SODIUM CHLORIDE 0.9 % IV BOLUS (SEPSIS)
1000.0000 mL | Freq: Once | INTRAVENOUS | Status: AC
  Administered 2016-08-12: 1000 mL via INTRAVENOUS

## 2016-08-12 MED ORDER — SODIUM CHLORIDE 0.9 % IV BOLUS (SEPSIS)
500.0000 mL | Freq: Once | INTRAVENOUS | Status: AC
  Administered 2016-08-12: 500 mL via INTRAVENOUS

## 2016-08-12 MED ORDER — FERROUS SULFATE 325 (65 FE) MG PO TABS
325.0000 mg | ORAL_TABLET | Freq: Every day | ORAL | Status: DC
  Administered 2016-08-12 – 2016-08-15 (×4): 325 mg via ORAL
  Filled 2016-08-12 (×4): qty 1

## 2016-08-12 MED ORDER — INSULIN GLARGINE 100 UNIT/ML ~~LOC~~ SOLN
10.0000 [IU] | Freq: Every day | SUBCUTANEOUS | Status: DC
  Administered 2016-08-12: 10 [IU] via SUBCUTANEOUS
  Filled 2016-08-12 (×2): qty 0.1

## 2016-08-12 MED ORDER — DEXTROSE 5 % IV SOLN
1.0000 g | INTRAVENOUS | Status: DC
  Administered 2016-08-12 – 2016-08-13 (×2): 1 g via INTRAVENOUS
  Filled 2016-08-12 (×4): qty 10

## 2016-08-12 MED ORDER — INSULIN ASPART 100 UNIT/ML ~~LOC~~ SOLN
0.0000 [IU] | SUBCUTANEOUS | Status: DC
  Administered 2016-08-12 – 2016-08-14 (×2): 2 [IU] via SUBCUTANEOUS

## 2016-08-12 MED ORDER — AMLODIPINE BESYLATE 5 MG PO TABS
10.0000 mg | ORAL_TABLET | Freq: Every morning | ORAL | Status: DC
  Administered 2016-08-12 – 2016-08-15 (×4): 10 mg via ORAL
  Filled 2016-08-12 (×4): qty 2

## 2016-08-12 MED ORDER — PROMETHAZINE HCL 25 MG/ML IJ SOLN
12.5000 mg | Freq: Four times a day (QID) | INTRAMUSCULAR | Status: DC | PRN
  Administered 2016-08-12: 25 mg via INTRAVENOUS
  Administered 2016-08-13: 12.5 mg via INTRAVENOUS
  Filled 2016-08-12 (×2): qty 1

## 2016-08-12 MED ORDER — ONDANSETRON HCL 4 MG/2ML IJ SOLN
4.0000 mg | Freq: Once | INTRAMUSCULAR | Status: AC
  Administered 2016-08-12: 4 mg via INTRAVENOUS
  Filled 2016-08-12: qty 2

## 2016-08-12 MED ORDER — METRONIDAZOLE IN NACL 5-0.79 MG/ML-% IV SOLN
500.0000 mg | Freq: Three times a day (TID) | INTRAVENOUS | Status: DC
  Administered 2016-08-12 – 2016-08-14 (×7): 500 mg via INTRAVENOUS
  Filled 2016-08-12 (×9): qty 100

## 2016-08-12 MED ORDER — ATORVASTATIN CALCIUM 40 MG PO TABS
40.0000 mg | ORAL_TABLET | Freq: Every evening | ORAL | Status: DC
  Administered 2016-08-12 – 2016-08-14 (×3): 40 mg via ORAL
  Filled 2016-08-12 (×3): qty 1

## 2016-08-12 MED ORDER — ACETAMINOPHEN 650 MG RE SUPP: 650.0000 mg | Freq: Four times a day (QID) | RECTAL | Status: DC | PRN

## 2016-08-12 MED ORDER — DEXTROSE-NACL 5-0.9 % IV SOLN
INTRAVENOUS | Status: DC
  Administered 2016-08-12: 22:00:00 via INTRAVENOUS
  Administered 2016-08-13: 300 mL via INTRAVENOUS
  Administered 2016-08-13: 1000 mL via INTRAVENOUS

## 2016-08-12 MED ORDER — ONDANSETRON HCL 4 MG/2ML IJ SOLN
4.0000 mg | Freq: Four times a day (QID) | INTRAMUSCULAR | Status: DC | PRN
  Administered 2016-08-12 – 2016-08-13 (×2): 4 mg via INTRAVENOUS
  Filled 2016-08-12 (×2): qty 2

## 2016-08-12 MED ORDER — SODIUM CHLORIDE 0.9 % IV SOLN
INTRAVENOUS | Status: DC
  Administered 2016-08-12: 75 mL/h via INTRAVENOUS

## 2016-08-12 MED ORDER — PROMETHAZINE HCL 25 MG PO TABS: 12.5000 mg | ORAL_TABLET | Freq: Three times a day (TID) | ORAL | Status: DC | PRN

## 2016-08-12 MED ORDER — DIPHENOXYLATE-ATROPINE 2.5-0.025 MG PO TABS
1.0000 | ORAL_TABLET | Freq: Four times a day (QID) | ORAL | Status: DC | PRN
  Administered 2016-08-12 – 2016-08-15 (×3): 2 via ORAL
  Filled 2016-08-12 (×3): qty 2

## 2016-08-12 MED ORDER — FENTANYL CITRATE (PF) 100 MCG/2ML IJ SOLN
50.0000 ug | Freq: Once | INTRAMUSCULAR | Status: AC
  Administered 2016-08-12: 50 ug via INTRAVENOUS
  Filled 2016-08-12: qty 2

## 2016-08-12 NOTE — ED Notes (Signed)
Patient does not want her port to be used. Patient stuck twice and no success. IV team consulted.

## 2016-08-12 NOTE — ED Notes (Signed)
Requested urine

## 2016-08-12 NOTE — ED Provider Notes (Signed)
[facility] DEPT Provider Note   CSN: [id number] Arrival date & time: 08/11/16  2135  By signing my name below, I, [name], attest that this documentation has been prepared under the direction and in the presence of [name], MD  Electronically Signed: [name], Scribe. 08/12/16. 1:34 AM.  History   Chief Complaint Chief Complaint  Patient presents with  . Abdominal Pain   The history is provided by the patient. No language interpreter was used.  Abdominal Pain   This is a new problem. The current episode started more than 2 days ago. The problem occurs constantly. The problem has not changed since onset.Associated with: chemo. The pain is located in the generalized abdominal region. The pain is moderate. Associated symptoms include diarrhea, nausea and vomiting. Pertinent negatives include fever and frequency. Nothing aggravates the symptoms. Nothing relieves the symptoms. Past workup includes CT scan. Her past medical history does not include ulcerative colitis.    HPI Comments: Michelle Erickson is a 61 y.o. female with PMHx metastatic colon cancer to liver who presents to the Emergency Department complaining of waxing/waning, gradually worsening lower abdominal pain that began 1 day ago. She has associated nausea, vomiting (3-4 episodes), diarrhea (2-3 per day), and mild blood in stool (but none this week). Pt states the pain is worse after a bowel movement. Pt is on chemotherapy (her last treatment on 08-06-16) and received a blood transfusion 2 days ago. She denies hematemesis, dysuria, increased urinary frequency, and fever. Pt does not want her port catheter accessed. Her next chemotherapy treatment is Aug 1. Past Medical History:  Diagnosis Date  . Back pain   . Diabetes mellitus without complication ([place])   . Family history of colon cancer   . Hyperlipemia   . Hypertension   . Metastatic colon cancer to liver ([place]) 07/21/2014  . Snoring   . Wears glasses    contacts/glasses    Patient Active Problem List   Diagnosis Date Noted  . Goals of care, counseling/discussion 01/31/2016  . Port catheter in place 05/16/2015  . Genetic testing 08/31/2014  . 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 DM (diabetes mellitus), type 2 ([place]) 08/11/2014  . Family history of colon cancer   . Metastatic colon cancer to liver ([place]) 07/21/2014    Past Surgical History:  Procedure Laterality Date  . CATARACT EXTRACTION Bilateral   . MOUTH SURGERY    . PORTACATH PLACEMENT N/A 08/03/2014   Procedure: INSERTION PORT-A-CATH;  Surgeon: Stark Klein, MD;  Location: [facility];  Service: General;  Laterality: N/A;    OB History    No data available       Home Medications    Prior to Admission medications   Medication Sig Start Date End Date Taking? Authorizing Provider  amLODipine (NORVASC) 10 MG tablet Take 10 mg by mouth every morning.    Yes [provider]  atorvastatin (LIPITOR) 40 MG tablet Take 40 mg by mouth every evening.   Yes [provider]  diphenoxylate-atropine (LOMOTIL) 2.5-0.025 MG tablet Take 1-8 tablets by mouth 4 (four) times daily as needed for diarrhea or loose stools. 04/16/16  Yes Truitt Merle, MD  ferrous sulfate 325 (65 FE) MG tablet Take 325 mg by mouth daily with breakfast.   Yes [provider]  Insulin Glargine (LANTUS SOLOSTAR) 100 UNIT/ML Solostar Pen Inject 20 Units into the skin daily at 10 pm.    Yes [provider]  insulin lispro (HUMALOG) 100 UNIT/ML injection Inject 4-8  Units into the skin 2 (two) times daily as needed for high blood sugar. Sliding scale   Yes [provider]  lidocaine-prilocaine (EMLA) cream Apply 1 application topically once.  08/08/14  Yes [provider]  promethazine (PHENERGAN) 25 MG tablet Take 0.5-1 tablets (12.5-25 mg total) by mouth every 8 (eight) hours as needed for nausea or vomiting. 07/24/16  Yes Truitt Merle, MD  traMADol (ULTRAM) 50 MG tablet Take 1  tablet (50 mg total) by mouth every 6 (six) hours as needed. Patient taking differently: Take 50 mg by mouth every 6 (six) hours as needed for moderate pain.  06/11/16  Yes Truitt Merle, MD    Family History Family History  Problem Relation Age of Onset  . COPD Father   . Heart attack Father   . Arthritis Sister   . Diabetes Sister   . Colon cancer Sister        dx over 10  . Colon polyps Sister   . Seizures Brother   . Colon cancer Sister        dx in her early 25s, died in her early to mid 37s  . Diabetes Mother     Social History Social History  Substance Use Topics  . Smoking status: Former Smoker    Packs/day: 1.00    Years: 6.00    Types: Cigarettes    Quit date: 01/21/1976  . Smokeless tobacco: Never Used  . Alcohol use No     Allergies   Patient has no known allergies.   Review of Systems Review of Systems  Constitutional: Negative for appetite change, chills and fever.  HENT: Negative for drooling and facial swelling.   Eyes: Negative for photophobia.  Respiratory: Negative for shortness of breath.   Cardiovascular: Negative for chest pain, palpitations and leg swelling.  Gastrointestinal: Positive for abdominal pain, blood in stool, diarrhea, nausea and vomiting. Negative for anal bleeding.  Genitourinary: Negative for difficulty urinating and frequency.  Musculoskeletal: Negative for neck stiffness.  Skin: Negative for pallor.  Neurological: Negative for facial asymmetry and speech difficulty.  Psychiatric/Behavioral: Negative for suicidal ideas.  All other systems reviewed and are negative.    Physical Exam Updated Vital Signs BP (!) 143/70   Pulse 91   Temp 99 F (37.2 C) (Oral)   Resp 15   Ht 5\' 4"  (1.626 m)   Wt 147 lb (66.7 kg)   SpO2 100%   BMI 25.23 kg/m   Physical Exam  Constitutional: She is oriented to person, place, and time. She appears well-developed and well-nourished. No distress.  HENT:  Head: Normocephalic and atraumatic.    Eyes: EOM are normal.  Pinpoint pupils  Neck: Neck supple.  Cardiovascular: Normal rate and regular rhythm.  Exam reveals no friction rub.   No murmur heard. Pulmonary/Chest: Breath sounds normal. No respiratory distress. She has no wheezes. She has no rales.  Abdominal: Soft. She exhibits no distension. There is no tenderness. There is no rebound and no guarding.  Hyperactive in epigastrum Hypoactive everywhere else  Genitourinary: No vaginal discharge found.  Musculoskeletal: Normal range of motion.  Neurological: She is alert and oriented to person, place, and time.  Skin: Skin is warm and dry. Capillary refill takes less than 2 seconds. No rash noted. No erythema.  Psychiatric: She has a normal mood and affect.    ED Treatments / Results  DIAGNOSTIC STUDIES: Oxygen Saturation is 100% on RA, normal by my interpretation.    COORDINATION OF CARE: 1:32  AM Discussed treatment plan with pt at bedside and pt agreed to plan.  Labs (all labs ordered are listed, but only abnormal results are displayed)  Results for orders placed or performed during the hospital encounter of 08/11/16  CBC  Result Value Ref Range   WBC 1.2 (LL) 4.0 - 10.5 K/uL   RBC 3.81 (L) 3.87 - 5.11 MIL/uL   Hemoglobin 11.2 (L) 12.0 - 15.0 g/dL   HCT 33.5 (L) 36.0 - 46.0 %   MCV 87.9 78.0 - 100.0 fL   MCH 29.4 26.0 - 34.0 pg   MCHC 33.4 30.0 - 36.0 g/dL   RDW 15.7 (H) 11.5 - 15.5 %   Platelets 195 150 - 400 K/uL  Magnesium  Result Value Ref Range   Magnesium 1.7 1.7 - 2.4 mg/dL  Comprehensive metabolic panel  Result Value Ref Range   Sodium 130 (L) 135 - 145 mmol/L   Potassium 4.7 3.5 - 5.1 mmol/L   Chloride 99 (L) 101 - 111 mmol/L   CO2 20 (L) 22 - 32 mmol/L   Glucose, Bld 161 (H) 65 - 99 mg/dL   BUN 39 (H) 6 - 20 mg/dL   Creatinine, Ser 2.00 (H) 0.44 - 1.00 mg/dL   Calcium 8.9 8.9 - 10.3 mg/dL   Total Protein 7.2 6.5 - 8.1 g/dL   Albumin 3.1 (L) 3.5 - 5.0 g/dL   AST 31 15 - 41 U/L   ALT 35 14 -  54 U/L   Alkaline Phosphatase 222 (H) 38 - 126 U/L   Total Bilirubin 1.2 0.3 - 1.2 mg/dL   GFR calc non Af Amer 25 (L) >60 mL/min   GFR calc Af Amer 29 (L) >60 mL/min   Anion gap 11 5 - 15  Lipase, blood  Result Value Ref Range   Lipase <10 (L) 11 - 51 U/L  Hepatic function panel  Result Value Ref Range   Total Protein 7.2 6.5 - 8.1 g/dL   Albumin 3.2 (L) 3.5 - 5.0 g/dL   AST 31 15 - 41 U/L   ALT 35 14 - 54 U/L   Alkaline Phosphatase 227 (H) 38 - 126 U/L   Total Bilirubin 1.2 0.3 - 1.2 mg/dL   Bilirubin, Direct 0.4 0.1 - 0.5 mg/dL   Indirect Bilirubin 0.8 0.3 - 0.9 mg/dL  Potassium  Result Value Ref Range   Potassium 4.7 3.5 - 5.1 mmol/L  I-stat troponin, ED  Result Value Ref Range   Troponin i, poc 0.01 0.00 - 0.08 ng/mL   Comment 3           Dg Chest Portable 1 View  Result Date: 08/12/2016 CLINICAL DATA:  Vomiting, nausea and hematochezia since yesterday. EXAM: PORTABLE CHEST 1 VIEW COMPARISON:  05/26/2016, 01/28/2016 FINDINGS: Left-sided port terminates in the SVC just below the azygos vein junction. No focal airspace consolidation or alveolar edema. The lungs are grossly clear. There is no large effusion or pneumothorax. Cardiac and mediastinal contours appear unremarkable. IMPRESSION: No acute cardiopulmonary findings. Electronically Signed   By: Andreas Newport M.D.   On: 08/12/2016 02:00   Ct Renal Stone Study  Result Date: 08/12/2016 CLINICAL DATA:  Lower abdominal pain. History of metastatic colon cancer. EXAM: CT ABDOMEN AND PELVIS WITHOUT CONTRAST TECHNIQUE: Multidetector CT imaging of the abdomen and pelvis was performed following the standard protocol without IV contrast. COMPARISON:  PET CT 05/26/2016 FINDINGS: Lower chest: 16 x 13 mm left lower lobe pulmonary nodule has increased in size from 14  x 9 mm previously. Hepatobiliary: Hepatic lesions demonstrated on recent PET CT are not clearly visible on this noncontrast study. Large lamellated gallstones are present in  the gallbladder. Pancreas: Normal contours without ductal dilatation. No peripancreatic fluid collection. Spleen: Normal. Adrenals/Urinary Tract: --Adrenal glands: Normal. --Right kidney/ureter: No hydronephrosis or perinephric stranding. No nephrolithiasis. No obstructing ureteral stones. --Left kidney/ureter: No hydronephrosis or perinephric stranding. No nephrolithiasis. No obstructing ureteral stones. --Urinary bladder: Unremarkable. Stomach/Bowel: --Stomach/Duodenum: No hiatal hernia or other gastric abnormality. Normal duodenal course and caliber. --Small bowel: No dilatation or inflammation. --Colon: There is mild wall thickening of the distal descending and sigmoid colon. No surrounding inflammation. --Appendix: Not visualized. No right lower quadrant inflammation or free fluid. Vascular/Lymphatic: Atherosclerotic calcification is present within the non-aneurysmal abdominal aorta, without hemodynamically significant stenosis. No abdominal or pelvic lymphadenopathy. Reproductive: Normal uterus and ovaries. Musculoskeletal. Multilevel degenerative disc disease and facet arthrosis. No bony spinal canal stenosis. No lytic or blastic lesions. Grade 1 L4-L5 anterolisthesis secondary to facet hypertrophy. Other: None. IMPRESSION: 1. No acute abnormality of the abdomen or pelvis. 2. Predominantly fluid-filled proximal colon, compatible with diarrheal illness. Nonspecific mild colonic wall thickening of the distal descending and sigmoid colon is similar to the prior study. 3. Known hepatic metastases are not visible on this noncontrast study. 4. Increased size of left lower lobe pulmonary metastasis. 5.  Aortic Atherosclerosis (ICD10-I70.0). Electronically Signed   By: Ulyses Jarred M.D.   On: 08/12/2016 04:35    EKG  EKG Interpretation  Date/Time:  Tuesday August 12 2016 01:35:25 EDT Ventricular Rate:  92 PR Interval:    QRS Duration: 92 QT Interval:  348 QTC Calculation: 431 R Axis:   44 Text  Interpretation:  Sinus rhythm Confirmed by Randal Buba, Brinson Tozzi ([id number]) on 08/12/2016 2:09:22 AM       Radiology No results found.  Procedures Procedures (including critical care time)  Medications Ordered in ED  Medications  ondansetron (ZOFRAN-ODT) disintegrating tablet 4 mg (not administered)  sodium chloride 0.9 % bolus 1,000 mL (not administered)  ondansetron (ZOFRAN) injection 4 mg (not administered)  sodium chloride 0.9 % bolus 500 mL (500 mLs Intravenous New Bag/Given 08/12/16 0439)  ondansetron (ZOFRAN) injection 4 mg (4 mg Intravenous Given 08/12/16 0441)  fentaNYL (SUBLIMAZE) injection 50 mcg (50 mcg Intravenous Given 08/12/16 0441)     Final Clinical Impressions(s) / ED Diagnoses  Will admit for pain and dehydration secondary to chemotherapy with bump in creatinine.    I personally performed the services described in this documentation, which was scribed in my presence. The recorded information has been reviewed and is accurate.      Atlee Kluth, MD 08/12/16 [phone]

## 2016-08-12 NOTE — ED Notes (Signed)
Date and time results received: 08/12/16 2:14 AM  (use smartphrase ".now" to insert current time)  Test: WBC Critical Value: 1.2  Name of Provider Notified: [place]   Orders Received? Or Actions Taken?:

## 2016-08-12 NOTE — Progress Notes (Signed)
  PROGRESS NOTE  Patient admitted earlier this morning. See H&P. Patient with hx colon cancer with mets to liver and lung, on chemo and follows with Dr. Burr Medico. Presented with intractable nausea, vomiting, diarrhea, lower abdominal pain. I examined patient with Dr. Burr Medico at bedside with me. Patient was actively vomiting during our examination. This is most likely related to her chemotherapy. GI PCR is pending due to diarrhea; she was empirically started on Flagyl. If PCR is negative, we will stop Flagyl as this is likely not due to an infectious etiology. Patient was also empirically started on Rocephin due to pyuria. Await urine culture. Continue Zofran, Phenergan for nausea and vomiting.   Dessa Phi, DO [facility] [email or web address] Password [facility] 08/12/2016, 11:48 AM

## 2016-08-12 NOTE — Progress Notes (Signed)
Michelle Erickson   DOB:[birth year]   KW#:[id number]   JME#:[id number]  Oncology follow up note  Subjective: The patient is well-known to me, under my care for her metastatic colon cancer. She is on second line chemotherapy, has been tolerating not very well overall, she was admitted for intractable nausea, vomiting, and abdominal cramps yesterday. No fever or chills. She has been eating and drinking little lately.   Objective:  Vitals:   08/12/16 0650 08/12/16 0955  BP: 140/65 (!) 157/66  Pulse: 98   Resp:    Temp: 99.3 F (37.4 C)     Body mass index is 25.23 kg/m.  Intake/Output Summary (Last 24 hours) at 08/12/16 1151 Last data filed at 08/12/16 1113  Gross per 24 hour  Intake                0 ml  Output                1 ml  Net               -1 ml     Sclerae unicteric  Oropharynx clear  No peripheral adenopathy  Lungs clear -- no rales or rhonchi  Heart regular rate and rhythm  Abdomen benign, soft and nontender  MSK no focal spinal tenderness, no peripheral edema  Neuro nonfocal   CBG (last 3)   Recent Labs  08/12/16 0516 08/12/16 0815  GLUCAP 205* 172*     Labs:  Lab Results  Component Value Date   WBC 1.2 (LL) 08/12/2016   HGB 11.2 (L) 08/12/2016   HCT 33.5 (L) 08/12/2016   MCV 87.9 08/12/2016   PLT 195 08/12/2016   NEUTROABS 5.7 08/06/2016   CMP Latest Ref Rng & Units 08/12/2016 08/12/2016 08/12/2016  Glucose 65 - 99 mg/dL - 161(H) -  BUN 6 - 20 mg/dL - 39(H) -  Creatinine 0.44 - 1.00 mg/dL - 2.00(H) -  Sodium 135 - 145 mmol/L - 130(L) -  Potassium 3.5 - 5.1 mmol/L - 4.7 4.7  Chloride 101 - 111 mmol/L - 99(L) -  CO2 22 - 32 mmol/L - 20(L) -  Calcium 8.9 - 10.3 mg/dL - 8.9 -  Total Protein 6.5 - 8.1 g/dL 7.2 7.2 -  Total Bilirubin 0.3 - 1.2 mg/dL 1.2 1.2 -  Alkaline Phos 38 - 126 U/L 227(H) 222(H) -  AST 15 - 41 U/L 31 31 -  ALT 14 - 54 U/L 35 35 -    Urine Studies No results for input(s): UHGB, CRYS in the last 72 hours.  Invalid input(s):  UACOL, UAPR, USPG, UPH, UTP, UGL, UKET, UBIL, UNIT, UROB, ULEU, UEPI, UWBC, URBC, UBAC, CAST, UCOM, BILUA  Basic Metabolic Panel:  Recent Labs Lab 08/06/16 0934 08/12/16 0154  NA 137 130*  K 5.0 4.7  4.7  CL  --  99*  CO2 23 20*  GLUCOSE 231* 161*  BUN 20.6 39*  CREATININE 1.5* 2.00*  CALCIUM 8.9 8.9  MG  --  1.7   GFR Estimated Creatinine Clearance: 27 mL/min (A) (by C-G formula based on SCr of 2 mg/dL (H)). Liver Function Tests:  Recent Labs Lab 08/06/16 0934 08/12/16 0154  AST 51* 31  31  ALT 54 35  35  ALKPHOS 249* 227*  222*  BILITOT 0.27 1.2  1.2  PROT 6.8 7.2  7.2  ALBUMIN 2.7* 3.2*  3.1*    Recent Labs Lab 08/12/16 0154  LIPASE <10*   No results for input(s): AMMONIA  in the last 168 hours. Coagulation profile No results for input(s): INR, PROTIME in the last 168 hours.  CBC:  Recent Labs Lab 08/06/16 0934 08/12/16 0132  WBC 7.9 1.2*  NEUTROABS 5.7  --   HGB 7.2* 11.2*  HCT 22.4* 33.5*  MCV 90.9 87.9  PLT 219 195   Cardiac Enzymes: No results for input(s): CKTOTAL, CKMB, CKMBINDEX, TROPONINI in the last 168 hours. BNP: Invalid input(s): POCBNP CBG:  Recent Labs Lab 08/12/16 0516 08/12/16 0815  GLUCAP 205* 172*   D-Dimer No results for input(s): DDIMER in the last 72 hours. Hgb A1c No results for input(s): HGBA1C in the last 72 hours. Lipid Profile No results for input(s): CHOL, HDL, LDLCALC, TRIG, CHOLHDL, LDLDIRECT in the last 72 hours. Thyroid function studies No results for input(s): TSH, T4TOTAL, T3FREE, THYROIDAB in the last 72 hours.  Invalid input(s): FREET3 Anemia work up No results for input(s): VITAMINB12, FOLATE, FERRITIN, TIBC, IRON, RETICCTPCT in the last 72 hours. Microbiology No results found for this or any previous visit (from the past 240 hour(s)).   Studies:  Dg Chest Portable 1 View  Result Date: 08/12/2016 CLINICAL DATA:  Vomiting, nausea and hematochezia since yesterday. EXAM: PORTABLE CHEST 1  VIEW COMPARISON:  05/26/2016, 01/28/2016 FINDINGS: Left-sided port terminates in the SVC just below the azygos vein junction. No focal airspace consolidation or alveolar edema. The lungs are grossly clear. There is no large effusion or pneumothorax. Cardiac and mediastinal contours appear unremarkable. IMPRESSION: No acute cardiopulmonary findings. Electronically Signed   By: Andreas Newport M.D.   On: 08/12/2016 02:00   Ct Renal Stone Study  Result Date: 08/12/2016 CLINICAL DATA:  Lower abdominal pain. History of metastatic colon cancer. EXAM: CT ABDOMEN AND PELVIS WITHOUT CONTRAST TECHNIQUE: Multidetector CT imaging of the abdomen and pelvis was performed following the standard protocol without IV contrast. COMPARISON:  PET CT 05/26/2016 FINDINGS: Lower chest: 16 x 13 mm left lower lobe pulmonary nodule has increased in size from 14 x 9 mm previously. Hepatobiliary: Hepatic lesions demonstrated on recent PET CT are not clearly visible on this noncontrast study. Large lamellated gallstones are present in the gallbladder. Pancreas: Normal contours without ductal dilatation. No peripancreatic fluid collection. Spleen: Normal. Adrenals/Urinary Tract: --Adrenal glands: Normal. --Right kidney/ureter: No hydronephrosis or perinephric stranding. No nephrolithiasis. No obstructing ureteral stones. --Left kidney/ureter: No hydronephrosis or perinephric stranding. No nephrolithiasis. No obstructing ureteral stones. --Urinary bladder: Unremarkable. Stomach/Bowel: --Stomach/Duodenum: No hiatal hernia or other gastric abnormality. Normal duodenal course and caliber. --Small bowel: No dilatation or inflammation. --Colon: There is mild wall thickening of the distal descending and sigmoid colon. No surrounding inflammation. --Appendix: Not visualized. No right lower quadrant inflammation or free fluid. Vascular/Lymphatic: Atherosclerotic calcification is present within the non-aneurysmal abdominal aorta, without  hemodynamically significant stenosis. No abdominal or pelvic lymphadenopathy. Reproductive: Normal uterus and ovaries. Musculoskeletal. Multilevel degenerative disc disease and facet arthrosis. No bony spinal canal stenosis. No lytic or blastic lesions. Grade 1 L4-L5 anterolisthesis secondary to facet hypertrophy. Other: None. IMPRESSION: 1. No acute abnormality of the abdomen or pelvis. 2. Predominantly fluid-filled proximal colon, compatible with diarrheal illness. Nonspecific mild colonic wall thickening of the distal descending and sigmoid colon is similar to the prior study. 3. Known hepatic metastases are not visible on this noncontrast study. 4. Increased size of left lower lobe pulmonary metastasis. 5.  Aortic Atherosclerosis (ICD10-I70.0). Electronically Signed   By: Ulyses Jarred M.D.   On: 08/12/2016 04:35    Assessment: 61 y.o. female with past  medical history of metastatic colon cancer to liver and lungs, on second line chemotherapy FOLFIRI, DM, CKD, presented to emergency room for intractable nausea and vomiting  1. Intractable nausea and vomiting, likely secondary to chemotherapy and underline cancer  2. Metastatic colon cancer to liver and lungs, on second line palliative chemotherapy FOLFIRI, not tolerating well, with nausea and diarrhea 3. DM 4. CKD 5. Anorexia and weight loss 6. 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 UTI and colitis   Plan:  -Her nausea and vomiting are likely related to her chemotherapy and underline malignancy, continue supportive care with IVF and antiemetics  -no evidence of bowel obstruction on CT  -consider dexa 4mg  daily and ativan prn for nausea if zofran and phenergan not enough  -I agree with antibitocs for now, check WBC diff, if she is neutropenic with ANC<0.8, may give granix  -will hold chemo for now. Her CT scan show probably lung mets progression, I have ordered PET scan for staging, plan to be done after  discharge. I will likely change her chemo regimen if she recovers well and wants to continue treatment -I will follow up as needed when she is in the hospital.  -appreciate the excellent care from the hospitalist team   Truitt Merle, MD 08/12/2016  11:51 AM

## 2016-08-12 NOTE — H&P (Signed)
History and Physical    Michelle Erickson EXB:[id number] DOB: Mar 20, 1955 DOA: 08/11/2016  PCP: Dione Housekeeper, MD  Patient coming from: Home  I have personally briefly reviewed patient's old medical records in [place]  Chief Complaint: Abd pain, N/V  HPI: Michelle Erickson is a 61 y.o. female with medical history significant of Colon cancer with mets to liver and lung.  Patient has been on chemotherapy for ~2 years or so now.  Chemo last changed in Jan.  Patient presents to the ED with c/o waxing / waning, gradually worsening lower abd pain.  Symptoms onset 1 day ago.  Associated nausea, vomiting, diarrhea.  Mild blood in stool but none this week.  No hematemesis.  Last chemo treatment was 7/18 although there was a problem with the ambulatory pump (alarm went off) on 7/19 so chemo not finished until a day later than usual this time around.  She endorses chills.  Denies dysuria, flank pain, back pain, urinary frequency.   ED Course: Still having vomiting in room despite zofran in ED.   Review of Systems: As per HPI otherwise 10 point review of systems negative.   Past Medical History:  Diagnosis Date  . Back pain   . Diabetes mellitus without complication ([place])   . Family history of colon cancer   . Hyperlipemia   . Hypertension   . Metastatic colon cancer to liver ([place]) 07/21/2014  . Snoring   . Wears glasses    contacts/glasses    Past Surgical History:  Procedure Laterality Date  . CATARACT EXTRACTION Bilateral   . MOUTH SURGERY    . PORTACATH PLACEMENT N/A 08/03/2014   Procedure: INSERTION PORT-A-CATH;  Surgeon: Stark Klein, MD;  Location: [facility];  Service: General;  Laterality: N/A;     reports that she quit smoking about 40 years ago. Her smoking use included Cigarettes. She has a 6.00 pack-year smoking history. She has never used smokeless tobacco. She reports that she does not drink alcohol or use drugs.  No Known Allergies  Family History  Problem Relation Age  of Onset  . COPD Father   . Heart attack Father   . Arthritis Sister   . Diabetes Sister   . Colon cancer Sister        dx over 15  . Colon polyps Sister   . Seizures Brother   . Colon cancer Sister        dx in her early 50s, died in her early to mid 16s  . Diabetes Mother      Prior to Admission medications   Medication Sig Start Date End Date Taking? Authorizing Provider  amLODipine (NORVASC) 10 MG tablet Take 10 mg by mouth every morning.    Yes [provider]  atorvastatin (LIPITOR) 40 MG tablet Take 40 mg by mouth every evening.   Yes [provider]  diphenoxylate-atropine (LOMOTIL) 2.5-0.025 MG tablet Take 1-8 tablets by mouth 4 (four) times daily as needed for diarrhea or loose stools. 04/16/16  Yes Truitt Merle, MD  ferrous sulfate 325 (65 FE) MG tablet Take 325 mg by mouth daily with breakfast.   Yes [provider]  Insulin Glargine (LANTUS SOLOSTAR) 100 UNIT/ML Solostar Pen Inject 20 Units into the skin daily at 10 pm.    Yes [provider]  insulin lispro (HUMALOG) 100 UNIT/ML injection Inject 4-8 Units into the skin 2 (two) times daily as needed for high blood sugar. Sliding scale   Yes [provider]  lidocaine-prilocaine (EMLA) cream Apply 1 application topically once.  08/08/14  Yes [provider]  promethazine (PHENERGAN) 25 MG tablet Take 0.5-1 tablets (12.5-25 mg total) by mouth every 8 (eight) hours as needed for nausea or vomiting. 07/24/16  Yes Truitt Merle, MD  traMADol (ULTRAM) 50 MG tablet Take 1 tablet (50 mg total) by mouth every 6 (six) hours as needed. Patient taking differently: Take 50 mg by mouth every 6 (six) hours as needed for moderate pain.  06/11/16  Yes Truitt Merle, MD    Physical Exam: Vitals:   08/12/16 0330 08/12/16 0333 08/12/16 0430 08/12/16 0503  BP: (!) 156/76 (!) 156/76 (!) 160/79 (!) 152/75  Pulse: 89 89 89 89  Resp: 19 (!) '22 16 17  '$ Temp:      TempSrc:      SpO2: 98% 100% 100% 99%    Weight:      Height:        Constitutional: NAD, calm, comfortable Eyes: PERRL, lids and conjunctivae normal ENMT: Mucous membranes are moist. Posterior pharynx clear of any exudate or lesions.Normal dentition.  Neck: normal, supple, no masses, no thyromegaly Respiratory: clear to auscultation bilaterally, no wheezing, no crackles. Normal respiratory effort. No accessory muscle use.  Cardiovascular: Regular rate and rhythm, no murmurs / rubs / gallops. No extremity edema. 2+ pedal pulses. No carotid bruits.  Abdomen: no tenderness, no masses palpated. No hepatosplenomegaly. Bowel sounds positive.  Musculoskeletal: no clubbing / cyanosis. No joint deformity upper and lower extremities. Good ROM, no contractures. Normal muscle tone.  Skin: no rashes, lesions, ulcers. No induration Neurologic: CN 2-12 grossly intact. Sensation intact, DTR normal. Strength 5/5 in all 4.  Psychiatric: Normal judgment and insight. Alert and oriented x 3. Normal mood.    Labs on Admission: I have personally reviewed following labs and imaging studies  CBC:  Recent Labs Lab 08/06/16 0934 08/12/16 0132  WBC 7.9 1.2*  NEUTROABS 5.7  --   HGB 7.2* 11.2*  HCT 22.4* 33.5*  MCV 90.9 87.9  PLT 219 614   Basic Metabolic Panel:  Recent Labs Lab 08/06/16 0934 08/12/16 0154  NA 137 130*  K 5.0 4.7  4.7  CL  --  99*  CO2 23 20*  GLUCOSE 231* 161*  BUN 20.6 39*  CREATININE 1.5* 2.00*  CALCIUM 8.9 8.9  MG  --  1.7   GFR: Estimated Creatinine Clearance: 27 mL/min (A) (by C-G formula based on SCr of 2 mg/dL (H)). Liver Function Tests:  Recent Labs Lab 08/06/16 0934 08/12/16 0154  AST 51* 31  31  ALT 54 35  35  ALKPHOS 249* 227*  222*  BILITOT 0.27 1.2  1.2  PROT 6.8 7.2  7.2  ALBUMIN 2.7* 3.2*  3.1*    Recent Labs Lab 08/12/16 0154  LIPASE <10*   No results for input(s): AMMONIA in the last 168 hours. Coagulation Profile: No results for input(s): INR, PROTIME in the last 168  hours. Cardiac Enzymes: No results for input(s): CKTOTAL, CKMB, CKMBINDEX, TROPONINI in the last 168 hours. BNP (last 3 results) No results for input(s): PROBNP in the last 8760 hours. HbA1C: No results for input(s): HGBA1C in the last 72 hours. CBG:  Recent Labs Lab 08/12/16 0516  GLUCAP 205*   Lipid Profile: No results for input(s): CHOL, HDL, LDLCALC, TRIG, CHOLHDL, LDLDIRECT in the last 72 hours. Thyroid Function Tests: No results for input(s): TSH, T4TOTAL, FREET4, T3FREE, THYROIDAB in the last 72 hours. Anemia Panel: No results  for input(s): VITAMINB12, FOLATE, FERRITIN, TIBC, IRON, RETICCTPCT in the last 72 hours. Urine analysis:    Component Value Date/Time   COLORURINE AMBER (A) 08/12/2016 0442   APPEARANCEUR CLOUDY (A) 08/12/2016 0442   LABSPEC 1.014 08/12/2016 0442   PHURINE 5.0 08/12/2016 0442   GLUCOSEU NEGATIVE 08/12/2016 0442   HGBUR MODERATE (A) 08/12/2016 0442   BILIRUBINUR NEGATIVE 08/12/2016 0442   KETONESUR NEGATIVE 08/12/2016 0442   PROTEINUR >=300 (A) 08/12/2016 0442   UROBILINOGEN 1.0 08/11/2014 2224   NITRITE NEGATIVE 08/12/2016 0442   LEUKOCYTESUR MODERATE (A) 08/12/2016 0442    Radiological Exams on Admission: Dg Chest Portable 1 View  Result Date: 08/12/2016 CLINICAL DATA:  Vomiting, nausea and hematochezia since yesterday. EXAM: PORTABLE CHEST 1 VIEW COMPARISON:  05/26/2016, 01/28/2016 FINDINGS: Left-sided port terminates in the SVC just below the azygos vein junction. No focal airspace consolidation or alveolar edema. The lungs are grossly clear. There is no large effusion or pneumothorax. Cardiac and mediastinal contours appear unremarkable. IMPRESSION: No acute cardiopulmonary findings. Electronically Signed   By: Andreas Newport M.D.   On: 08/12/2016 02:00   Ct Renal Stone Study  Result Date: 08/12/2016 CLINICAL DATA:  Lower abdominal pain. History of metastatic colon cancer. EXAM: CT ABDOMEN AND PELVIS WITHOUT CONTRAST TECHNIQUE:  Multidetector CT imaging of the abdomen and pelvis was performed following the standard protocol without IV contrast. COMPARISON:  PET CT 05/26/2016 FINDINGS: Lower chest: 16 x 13 mm left lower lobe pulmonary nodule has increased in size from 14 x 9 mm previously. Hepatobiliary: Hepatic lesions demonstrated on recent PET CT are not clearly visible on this noncontrast study. Large lamellated gallstones are present in the gallbladder. Pancreas: Normal contours without ductal dilatation. No peripancreatic fluid collection. Spleen: Normal. Adrenals/Urinary Tract: --Adrenal glands: Normal. --Right kidney/ureter: No hydronephrosis or perinephric stranding. No nephrolithiasis. No obstructing ureteral stones. --Left kidney/ureter: No hydronephrosis or perinephric stranding. No nephrolithiasis. No obstructing ureteral stones. --Urinary bladder: Unremarkable. Stomach/Bowel: --Stomach/Duodenum: No hiatal hernia or other gastric abnormality. Normal duodenal course and caliber. --Small bowel: No dilatation or inflammation. --Colon: There is mild wall thickening of the distal descending and sigmoid colon. No surrounding inflammation. --Appendix: Not visualized. No right lower quadrant inflammation or free fluid. Vascular/Lymphatic: Atherosclerotic calcification is present within the non-aneurysmal abdominal aorta, without hemodynamically significant stenosis. No abdominal or pelvic lymphadenopathy. Reproductive: Normal uterus and ovaries. Musculoskeletal. Multilevel degenerative disc disease and facet arthrosis. No bony spinal canal stenosis. No lytic or blastic lesions. Grade 1 L4-L5 anterolisthesis secondary to facet hypertrophy. Other: None. IMPRESSION: 1. No acute abnormality of the abdomen or pelvis. 2. Predominantly fluid-filled proximal colon, compatible with diarrheal illness. Nonspecific mild colonic wall thickening of the distal descending and sigmoid colon is similar to the prior study. 3. Known hepatic metastases are  not visible on this noncontrast study. 4. Increased size of left lower lobe pulmonary metastasis. 5.  Aortic Atherosclerosis (ICD10-I70.0). Electronically Signed   By: Ulyses Jarred M.D.   On: 08/12/2016 04:35    EKG: Independently reviewed.  Assessment/Plan Principal Problem:   Intractable nausea and vomiting Active Problems:   Metastatic colon cancer to liver (HCC)   DM (diabetes mellitus), type 2 ([place])   CKD stage 3 due to type 2 diabetes mellitus ([place])   Acute lower UTI    1. Intractable nausea and vomiting - 1. PRN zofran 2. PRN phenergan for refractory N/V 3. Intractable N/V possible causes: 1. error with chemo delivery? there was a pump alarm on her ambulatory pump, half the chemo  had to be delivered a day later this time around. 2. worsening CA: note that her Lung met appears to have increased in size on todays CT when compared to PET-CT in May of this year. 1. Did inform patient and husband of this finding 2. Also worth noting that her CEA has trended up as well 3. possible underlying infection?  WBC 1.2 today, Tm 99.0 in ED, does have chills but not formal febrile neutropenia yet.  Keep an eye on this. 4. Call oncology in AM for consult. 2. UTI - UA is somewhat equivocal with 6-30 WBC and moderate leukocyte esterase, few bacteria, neg nitrites, no urinary symptoms. 1. Overall feel that risk/benefit wise would favor treating 2. Will order rocephin 3. Given diarrheal illness, will go ahead and add flagyl as well. 4. Urine and blood cultures pending 3. DM2 - 1. Half home lantus dose of 10 QHS 2. Sensitive scale SSI Q4H 4. CKD stage 3 - chronic and at the upper end of her baseline today. 1. IVF 2. Repeat BMP tomorrow  DVT prophylaxis: SCDs given hematochezia history Code Status: Full Family Communication: Husband at bedside Disposition Plan: Home after admit Consults called: None called Admission status: Place in obs   Zahra Peffley, [facility] Pager  [phone]  If 7AM-7PM, please contact day team taking care of patient [email or web address] Password [facility]  08/12/2016, 5:46 AM

## 2016-08-12 NOTE — Telephone Encounter (Signed)
Returned call to [employer] on 3W.  "calling at patient's daughter's request to notify Dr. Burr Medico has a patient in room 1344. "    Notified Butch Penny Dr. Burr Medico is off today.  This information will be routed to provider.

## 2016-08-13 DIAGNOSIS — E1122 Type 2 diabetes mellitus with diabetic chronic kidney disease: Secondary | ICD-10-CM

## 2016-08-13 LAB — CBC WITH DIFFERENTIAL/PLATELET
BASOS ABS: 0 10*3/uL (ref 0.0–0.1)
Basophils Relative: 1 %
EOS PCT: 6 %
Eosinophils Absolute: 0.1 10*3/uL (ref 0.0–0.7)
HEMATOCRIT: 30.4 % — AB (ref 36.0–46.0)
HEMOGLOBIN: 10.3 g/dL — AB (ref 12.0–15.0)
LYMPHS PCT: 41 %
Lymphs Abs: 0.7 10*3/uL (ref 0.7–4.0)
MCH: 29.1 pg (ref 26.0–34.0)
MCHC: 33.9 g/dL (ref 30.0–36.0)
MCV: 85.9 fL (ref 78.0–100.0)
MONOS PCT: 4 %
Monocytes Absolute: 0.1 10*3/uL (ref 0.1–1.0)
Neutro Abs: 0.9 10*3/uL — ABNORMAL LOW (ref 1.7–7.7)
Neutrophils Relative %: 48 %
Platelets: 126 10*3/uL — ABNORMAL LOW (ref 150–400)
RBC: 3.54 MIL/uL — AB (ref 3.87–5.11)
RDW: 14.8 % (ref 11.5–15.5)
WBC: 1.8 10*3/uL — AB (ref 4.0–10.5)

## 2016-08-13 LAB — GLUCOSE, CAPILLARY
GLUCOSE-CAPILLARY: 123 mg/dL — AB (ref 65–99)
GLUCOSE-CAPILLARY: 82 mg/dL (ref 65–99)
GLUCOSE-CAPILLARY: 83 mg/dL (ref 65–99)
GLUCOSE-CAPILLARY: 95 mg/dL (ref 65–99)
Glucose-Capillary: 76 mg/dL (ref 65–99)
Glucose-Capillary: 85 mg/dL (ref 65–99)
Glucose-Capillary: 98 mg/dL (ref 65–99)

## 2016-08-13 LAB — HIV ANTIBODY (ROUTINE TESTING W REFLEX): HIV Screen 4th Generation wRfx: NONREACTIVE

## 2016-08-13 LAB — URINE CULTURE

## 2016-08-13 LAB — COMPREHENSIVE METABOLIC PANEL
ALK PHOS: 174 U/L — AB (ref 38–126)
ALT: 23 U/L (ref 14–54)
AST: 19 U/L (ref 15–41)
Albumin: 2.6 g/dL — ABNORMAL LOW (ref 3.5–5.0)
Anion gap: 8 (ref 5–15)
BILIRUBIN TOTAL: 0.8 mg/dL (ref 0.3–1.2)
BUN: 34 mg/dL — AB (ref 6–20)
CALCIUM: 8.2 mg/dL — AB (ref 8.9–10.3)
CO2: 19 mmol/L — ABNORMAL LOW (ref 22–32)
CREATININE: 1.71 mg/dL — AB (ref 0.44–1.00)
Chloride: 109 mmol/L (ref 101–111)
GFR calc Af Amer: 36 mL/min — ABNORMAL LOW (ref >60)
GFR calc non Af Amer: 31 mL/min — ABNORMAL LOW (ref >60)
GLUCOSE: 86 mg/dL (ref 65–99)
Potassium: 4.2 mmol/L (ref 3.5–5.1)
Sodium: 136 mmol/L (ref 135–145)
TOTAL PROTEIN: 5.9 g/dL — AB (ref 6.5–8.1)

## 2016-08-13 MED ORDER — DEXTROSE-NACL 5-0.9 % IV SOLN: INTRAVENOUS | Status: DC

## 2016-08-13 MED ORDER — ENOXAPARIN SODIUM 30 MG/0.3ML ~~LOC~~ SOLN
30.0000 mg | SUBCUTANEOUS | Status: DC
  Administered 2016-08-13 – 2016-08-14 (×2): 30 mg via SUBCUTANEOUS
  Filled 2016-08-13 (×2): qty 0.3

## 2016-08-13 MED ORDER — INSULIN GLARGINE 100 UNIT/ML ~~LOC~~ SOLN
8.0000 [IU] | Freq: Every day | SUBCUTANEOUS | Status: DC
  Administered 2016-08-13 – 2016-08-14 (×2): 8 [IU] via SUBCUTANEOUS
  Filled 2016-08-13 (×2): qty 0.08

## 2016-08-13 NOTE — Progress Notes (Signed)
PROGRESS NOTE    ALBIRTHA GRINAGE  QQV:[id number] DOB: 06/05/1955 DOA: 08/11/2016 PCP: Dione Housekeeper, MD  Brief Narrative: 61 year old female with metastatic colon cancer on second line chemotherapy admitted with intractable nausea, vomiting, abdominal cramps and weakness. Last chemotherapy was 1 week ago on 7/18 Assessment & Plan:   Principal Problem:   Intractable nausea and vomiting - Felt to be secondary to recent chemotherapy -CT abdomen unremarkable for acute findings -Continue supportive care, IV fluids, trial of clears, Zofran/Phenergan -On empiric Flagyl, stop tomorrow if stable    Metastatic colon cancer to liver ([place]) -On chemotherapy followed by Dr.Feng, appreciate input    DM (diabetes mellitus), type 2 (HCC) -Continue half dose Lantus, we'll could done dose further    CKD stage 3 due to type 2 diabetes mellitus ([place]) -Creatinine improving, close to baseline -Continue IVF 1 more day-   Asymptomatic bacteriuria -Urine culture with multiple species and clinically did not have any symptoms of UTI -Stop ceftriaxone  Anemia/leukopenia -Secondary to chemotherapy -Neupogen if ANC trends down further   DVT prophylaxis: add lovenox Code Status: Full Code Family Communication: daughter at bedside Disposition Plan: Home when improved/tol diet  Consultants:   Onc Dr.Feng  Antimicrobials:   FLagyl 7/24   Subjective: Vomiting improving, stools more formed too  Objective: Vitals:   08/12/16 2039 08/13/16 0428 08/13/16 0927 08/13/16 1410  BP: (!) 133/57 (!) 141/67 138/70 (!) 119/56  Pulse: 92 86  84  Resp: 18 18  16   Temp: 99.2 F (37.3 C) 98.4 F (36.9 C)  98.6 F (37 C)  TempSrc: Oral Oral  Oral  SpO2: 98% 100%  99%  Weight:      Height:        Intake/Output Summary (Last 24 hours) at 08/13/16 1414 Last data filed at 08/13/16 1122  Gross per 24 hour  Intake           1557.5 ml  Output                6 ml  Net           1551.5 ml   Filed Weights     08/11/16 2336 08/12/16 0650  Weight: 66.7 kg (147 lb) 66.7 kg (147 lb)    Examination:  General exam: Appears calm and comfortable, chronically ill appearing Respiratory system: Clear to auscultation. Respiratory effort normal. Cardiovascular system: S1 & S2 heard, RRR. No JVD Gastrointestinal system: Abdomen is nondistended, soft and nontender.Normal bowel sounds heard. Central nervous system: Alert and oriented. No focal neurological deficits. Extremities: Symmetric 5 x 5 power. Skin: No rashes, lesions or ulcers Psychiatry: Judgement and insight appear normal. Mood & affect appropriate.     Data Reviewed:   CBC:  Recent Labs Lab 08/12/16 0132 08/13/16 0346  WBC 1.2* 1.8*  NEUTROABS  --  0.9*  HGB 11.2* 10.3*  HCT 33.5* 30.4*  MCV 87.9 85.9  PLT 195 332*   Basic Metabolic Panel:  Recent Labs Lab 08/12/16 0154 08/13/16 0346  NA 130* 136  K 4.7  4.7 4.2  CL 99* 109  CO2 20* 19*  GLUCOSE 161* 86  BUN 39* 34*  CREATININE 2.00* 1.71*  CALCIUM 8.9 8.2*  MG 1.7  --    GFR: Estimated Creatinine Clearance: 31.6 mL/min (A) (by C-G formula based on SCr of 1.71 mg/dL (H)). Liver Function Tests:  Recent Labs Lab 08/12/16 0154 08/13/16 0346  AST 31  31 19   ALT 35  35 23  ALKPHOS 227*  222* 174*  BILITOT 1.2  1.2 0.8  PROT 7.2  7.2 5.9*  ALBUMIN 3.2*  3.1* 2.6*    Recent Labs Lab 08/12/16 0154  LIPASE <10*   No results for input(s): AMMONIA in the last 168 hours. Coagulation Profile: No results for input(s): INR, PROTIME in the last 168 hours. Cardiac Enzymes: No results for input(s): CKTOTAL, CKMB, CKMBINDEX, TROPONINI in the last 168 hours. BNP (last 3 results) No results for input(s): PROBNP in the last 8760 hours. HbA1C: No results for input(s): HGBA1C in the last 72 hours. CBG:  Recent Labs Lab 08/12/16 2010 08/13/16 0014 08/13/16 0415 08/13/16 0746 08/13/16 1151  GLUCAP 81 85 76 83 82   Lipid Profile: No results for  input(s): CHOL, HDL, LDLCALC, TRIG, CHOLHDL, LDLDIRECT in the last 72 hours. Thyroid Function Tests: No results for input(s): TSH, T4TOTAL, FREET4, T3FREE, THYROIDAB in the last 72 hours. Anemia Panel: No results for input(s): VITAMINB12, FOLATE, FERRITIN, TIBC, IRON, RETICCTPCT in the last 72 hours. Urine analysis:    Component Value Date/Time   COLORURINE AMBER (A) 08/12/2016 0442   APPEARANCEUR CLOUDY (A) 08/12/2016 0442   LABSPEC 1.014 08/12/2016 0442   PHURINE 5.0 08/12/2016 0442   GLUCOSEU NEGATIVE 08/12/2016 0442   HGBUR MODERATE (A) 08/12/2016 0442   BILIRUBINUR NEGATIVE 08/12/2016 0442   KETONESUR NEGATIVE 08/12/2016 0442   PROTEINUR >=300 (A) 08/12/2016 0442   UROBILINOGEN 1.0 08/11/2014 2224   NITRITE NEGATIVE 08/12/2016 0442   LEUKOCYTESUR MODERATE (A) 08/12/2016 0442   Sepsis Labs: @LABRCNTIP (procalcitonin:4,lacticidven:4)  ) Recent Results (from the past 240 hour(s))  Culture, Urine     Status: Abnormal   Collection Time: 08/12/16  4:42 AM  Result Value Ref Range Status   Specimen Description URINE, RANDOM  Final   Special Requests NONE  Final   Culture MULTIPLE SPECIES PRESENT, SUGGEST RECOLLECTION (A)  Final   Report Status 08/13/2016 FINAL  Final  Culture, blood (routine x 2)     Status: None (Preliminary result)   Collection Time: 08/12/16  7:56 AM  Result Value Ref Range Status   Specimen Description BLOOD RIGHT HAND  Final   Special Requests IN PEDIATRIC BOTTLE Blood Culture adequate volume  Final   Culture   Final    NO GROWTH 1 DAY Performed at [facility], [place] [address], [place], [place] [address]    Report Status PENDING  Incomplete  Culture, blood (routine x 2)     Status: None (Preliminary result)   Collection Time: 08/12/16  7:56 AM  Result Value Ref Range Status   Specimen Description BLOOD LEFT HAND  Final   Special Requests IN PEDIATRIC BOTTLE Blood Culture adequate volume  Final   Culture   Final    NO GROWTH 1 DAY Performed at  [facility], [place] [address], [place], [place] [address]    Report Status PENDING  Incomplete  Gastrointestinal Panel by PCR , Stool     Status: None   Collection Time: 08/12/16  9:30 AM  Result Value Ref Range Status   Campylobacter species NOT DETECTED NOT DETECTED Final   Plesimonas shigelloides NOT DETECTED NOT DETECTED Final   Salmonella species NOT DETECTED NOT DETECTED Final   Yersinia enterocolitica NOT DETECTED NOT DETECTED Final   Vibrio species NOT DETECTED NOT DETECTED Final   Vibrio cholerae NOT DETECTED NOT DETECTED Final   Enteroaggregative E coli (EAEC) NOT DETECTED NOT DETECTED Final   Enteropathogenic E coli (EPEC) NOT DETECTED NOT DETECTED Final  Enterotoxigenic E coli (ETEC) NOT DETECTED NOT DETECTED Final   Shiga like toxin producing E coli (STEC) NOT DETECTED NOT DETECTED Final   Shigella/Enteroinvasive E coli (EIEC) NOT DETECTED NOT DETECTED Final   Cryptosporidium NOT DETECTED NOT DETECTED Final   Cyclospora cayetanensis NOT DETECTED NOT DETECTED Final   Entamoeba histolytica NOT DETECTED NOT DETECTED Final   Giardia lamblia NOT DETECTED NOT DETECTED Final   Adenovirus F40/41 NOT DETECTED NOT DETECTED Final   Astrovirus NOT DETECTED NOT DETECTED Final   Norovirus GI/GII NOT DETECTED NOT DETECTED Final   Rotavirus A NOT DETECTED NOT DETECTED Final   Sapovirus (I, II, IV, and V) NOT DETECTED NOT DETECTED Final         Radiology Studies: Dg Chest Portable 1 View  Result Date: 08/12/2016 CLINICAL DATA:  Vomiting, nausea and hematochezia since yesterday. EXAM: PORTABLE CHEST 1 VIEW COMPARISON:  05/26/2016, 01/28/2016 FINDINGS: Left-sided port terminates in the SVC just below the azygos vein junction. No focal airspace consolidation or alveolar edema. The lungs are grossly clear. There is no large effusion or pneumothorax. Cardiac and mediastinal contours appear unremarkable. IMPRESSION: No acute cardiopulmonary findings. Electronically Signed   By: Andreas Newport M.D.   On: 08/12/2016 02:00   Ct Renal Stone Study  Result Date: 08/12/2016 CLINICAL DATA:  Lower abdominal pain. History of metastatic colon cancer. EXAM: CT ABDOMEN AND PELVIS WITHOUT CONTRAST TECHNIQUE: Multidetector CT imaging of the abdomen and pelvis was performed following the standard protocol without IV contrast. COMPARISON:  PET CT 05/26/2016 FINDINGS: Lower chest: 16 x 13 mm left lower lobe pulmonary nodule has increased in size from 14 x 9 mm previously. Hepatobiliary: Hepatic lesions demonstrated on recent PET CT are not clearly visible on this noncontrast study. Large lamellated gallstones are present in the gallbladder. Pancreas: Normal contours without ductal dilatation. No peripancreatic fluid collection. Spleen: Normal. Adrenals/Urinary Tract: --Adrenal glands: Normal. --Right kidney/ureter: No hydronephrosis or perinephric stranding. No nephrolithiasis. No obstructing ureteral stones. --Left kidney/ureter: No hydronephrosis or perinephric stranding. No nephrolithiasis. No obstructing ureteral stones. --Urinary bladder: Unremarkable. Stomach/Bowel: --Stomach/Duodenum: No hiatal hernia or other gastric abnormality. Normal duodenal course and caliber. --Small bowel: No dilatation or inflammation. --Colon: There is mild wall thickening of the distal descending and sigmoid colon. No surrounding inflammation. --Appendix: Not visualized. No right lower quadrant inflammation or free fluid. Vascular/Lymphatic: Atherosclerotic calcification is present within the non-aneurysmal abdominal aorta, without hemodynamically significant stenosis. No abdominal or pelvic lymphadenopathy. Reproductive: Normal uterus and ovaries. Musculoskeletal. Multilevel degenerative disc disease and facet arthrosis. No bony spinal canal stenosis. No lytic or blastic lesions. Grade 1 L4-L5 anterolisthesis secondary to facet hypertrophy. Other: None. IMPRESSION: 1. No acute abnormality of the abdomen or pelvis. 2.  Predominantly fluid-filled proximal colon, compatible with diarrheal illness. Nonspecific mild colonic wall thickening of the distal descending and sigmoid colon is similar to the prior study. 3. Known hepatic metastases are not visible on this noncontrast study. 4. Increased size of left lower lobe pulmonary metastasis. 5.  Aortic Atherosclerosis (ICD10-I70.0). Electronically Signed   By: Ulyses Jarred M.D.   On: 08/12/2016 04:35        Scheduled Meds: . amLODipine  10 mg Oral q morning - 10a  . atorvastatin  40 mg Oral QPM  . ferrous sulfate  325 mg Oral Q breakfast  . insulin aspart  0-9 Units Subcutaneous Q4H  . insulin glargine  10 Units Subcutaneous QHS   Continuous Infusions: . dextrose 5 % and 0.9% NaCl 1,000 mL (08/13/16  1306)  . metronidazole Stopped (08/13/16 0901)     LOS: 1 day    Time spent: 48min    Domenic Polite, MD [facility] Page via [email or web address] password [email or web address]  If 7PM-7AM, please contact night-coverage [email or web address] Password [facility] 08/13/2016, 2:14 PM

## 2016-08-14 ENCOUNTER — Ambulatory Visit: Payer: BLUE CROSS/BLUE SHIELD

## 2016-08-14 DIAGNOSIS — F5089 Other specified eating disorder: Secondary | ICD-10-CM

## 2016-08-14 LAB — BASIC METABOLIC PANEL
ANION GAP: 7 (ref 5–15)
BUN: 27 mg/dL — ABNORMAL HIGH (ref 6–20)
CALCIUM: 8.1 mg/dL — AB (ref 8.9–10.3)
CO2: 19 mmol/L — AB (ref 22–32)
CREATININE: 1.95 mg/dL — AB (ref 0.44–1.00)
Chloride: 112 mmol/L — ABNORMAL HIGH (ref 101–111)
GFR calc non Af Amer: 26 mL/min — ABNORMAL LOW (ref >60)
GFR, EST AFRICAN AMERICAN: 30 mL/min — AB (ref >60)
Glucose, Bld: 133 mg/dL — ABNORMAL HIGH (ref 65–99)
Potassium: 3.9 mmol/L (ref 3.5–5.1)
SODIUM: 138 mmol/L (ref 135–145)

## 2016-08-14 LAB — GLUCOSE, CAPILLARY
GLUCOSE-CAPILLARY: 75 mg/dL (ref 65–99)
GLUCOSE-CAPILLARY: 87 mg/dL (ref 65–99)
Glucose-Capillary: 187 mg/dL — ABNORMAL HIGH (ref 65–99)
Glucose-Capillary: 68 mg/dL (ref 65–99)
Glucose-Capillary: 91 mg/dL (ref 65–99)
Glucose-Capillary: 94 mg/dL (ref 65–99)

## 2016-08-14 LAB — CBC
HEMATOCRIT: 28.3 % — AB (ref 36.0–46.0)
HEMOGLOBIN: 9.2 g/dL — AB (ref 12.0–15.0)
MCH: 28.8 pg (ref 26.0–34.0)
MCHC: 32.5 g/dL (ref 30.0–36.0)
MCV: 88.7 fL (ref 78.0–100.0)
Platelets: 139 10*3/uL — ABNORMAL LOW (ref 150–400)
RBC: 3.19 MIL/uL — ABNORMAL LOW (ref 3.87–5.11)
RDW: 15.1 % (ref 11.5–15.5)
WBC: 2.6 10*3/uL — AB (ref 4.0–10.5)

## 2016-08-14 NOTE — Evaluation (Signed)
Physical Therapy Evaluation Patient Details Name: Michelle Erickson MRN: [id number] DOB: Feb 15, 1955 Today's Date: 08/14/2016   History of Present Illness  61 y.o. female with past medical history of metastatic colon cancer to liver and lungs, on second line chemotherapy FOLFIRI, DM, CKD, presented to emergency room for intractable nausea and vomiting  Clinical Impression  The patient appears weak, not at baseline per daughter. Patient having loose BM and limited in ambulation. Pt admitted with above diagnosis. Pt currently with functional limitations due to the deficits listed below (see PT Problem List). Pt will benefit from skilled PT to increase their independence and safety with mobility to allow discharge to the venue listed below.       Follow Up Recommendations Home health PT;Supervision/Assistance - 24 hour    Equipment Recommendations  Other (comment) (TBD)    Recommendations for Other Services       Precautions / Restrictions Precautions Precautions: Fall Precaution Comments: frequent loose BM      Mobility  Bed Mobility Overal bed mobility: Independent                Transfers Overall transfer level: Needs assistance   Transfers: Sit to/from Stand;Stand Pivot Transfers Sit to Stand: Min guard Stand pivot transfers: Min guard       General transfer comment: self assists to get to [facility]  Ambulation/Gait Ambulation/Gait assistance: Min assist Ambulation Distance (Feet): 15 Feet Assistive device: Rolling walker (2 wheeled) Gait Pattern/deviations: Step-through pattern     General Gait Details: cues for safety around objects  Stairs            Wheelchair Mobility    Modified Rankin (Stroke Patients Only)       Balance                                             Pertinent Vitals/Pain Pain Assessment: Faces Faces Pain Scale: Hurts little more Pain Location: abdomen Pain Descriptors / Indicators: Cramping;Discomfort Pain  Intervention(s): Repositioned    Home Living Family/patient expects to be discharged to:: Private residence Living Arrangements: Spouse/significant other;Children Available Help at Discharge: Family Type of Home: House Home Access: Stairs to enter   Technical brewer of Steps: 1 Home Layout: One level Home Equipment: None      Prior Function Level of Independence: Independent               Hand Dominance        Extremity/Trunk Assessment   Upper Extremity Assessment Upper Extremity Assessment: Generalized weakness    Lower Extremity Assessment Lower Extremity Assessment: Generalized weakness    Cervical / Trunk Assessment Cervical / Trunk Assessment: Normal  Communication   Communication: No difficulties  Cognition Arousal/Alertness: Awake/alert Behavior During Therapy: WFL for tasks assessed/performed Overall Cognitive Status: Within Functional Limits for tasks assessed                                        General Comments      Exercises     Assessment/Plan    PT Assessment Patient needs continued PT services  PT Problem List Decreased strength;Decreased activity tolerance;Decreased mobility;Decreased knowledge of precautions;Decreased safety awareness;Decreased knowledge of use of DME       PT Treatment Interventions DME instruction;Gait training;Functional mobility training;Therapeutic activities;Patient/family education  PT Goals (Current goals can be found in the Care Plan section)  Acute Rehab PT Goals Patient Stated Goal: to go home PT Goal Formulation: With patient/family Time For Goal Achievement: 08/28/16 Potential to Achieve Goals: Good    Frequency Min 3X/week   Barriers to discharge  spouse works 5days a week, home alone      Co-evaluation               AM-PAC PT "6 Clicks" Daily Activity  Outcome Measure Difficulty turning over in bed (including adjusting bedclothes, sheets and blankets)?:  None Difficulty moving from lying on back to sitting on the side of the bed? : None Difficulty sitting down on and standing up from a chair with arms (e.g., wheelchair, bedside commode, etc,.)?: Total Help needed moving to and from a bed to chair (including a wheelchair)?: Total Help needed walking in hospital room?: Total Help needed climbing 3-5 steps with a railing? : Total 6 Click Score: 12    End of Session   Activity Tolerance: Patient tolerated treatment well Patient left: with call bell/phone within reach;with family/visitor present Nurse Communication: Mobility status PT Visit Diagnosis: Difficulty in walking, not elsewhere classified (R26.2)    Time: 6834-1962 PT Time Calculation (min) (ACUTE ONLY): 19 min   Charges:   PT Evaluation $PT Eval Low Complexity: 1 Procedure     PT G CodesTresa Erickson PT [phone]   Michelle Erickson 08/14/2016, 11:35 AM

## 2016-08-14 NOTE — Progress Notes (Signed)
Michelle Erickson   DOB:[birth year]   ZO#:[id number]   JWJ#:[id number]  Oncology follow up note  Subjective: Michelle Erickson feels better overall. She vomited once yesterday, nausea has improved. She has started eating and drinking some, overall feels better. Afebrile    Objective:  Vitals:   08/13/16 2132 08/14/16 0605  BP: (!) 143/62 132/60  Pulse: 91 86  Resp: 14 12  Temp: 99.8 F (37.7 C) 98.1 F (36.7 C)    Body mass index is 25.23 kg/m.  Intake/Output Summary (Last 24 hours) at 08/14/16 0920 Last data filed at 08/14/16 6213  Gross per 24 hour  Intake          1246.25 ml  Output                7 ml  Net          1239.25 ml     Sclerae unicteric  Oropharynx clear  No peripheral adenopathy  Lungs clear -- no rales or rhonchi  Heart regular rate and rhythm  Abdomen benign, soft and nontender  MSK no focal spinal tenderness, no peripheral edema  Neuro nonfocal   CBG (last 3)   Recent Labs  08/13/16 2351 08/14/16 0433 08/14/16 0736  GLUCAP 95 75 87     Labs:  Lab Results  Component Value Date   WBC 1.8 (L) 08/13/2016   HGB 10.3 (L) 08/13/2016   HCT 30.4 (L) 08/13/2016   MCV 85.9 08/13/2016   PLT 126 (L) 08/13/2016   NEUTROABS 0.9 (L) 08/13/2016   CMP Latest Ref Rng & Units 08/13/2016 08/12/2016 08/12/2016  Glucose 65 - 99 mg/dL 86 - 161(H)  BUN 6 - 20 mg/dL 34(H) - 39(H)  Creatinine 0.44 - 1.00 mg/dL 1.71(H) - 2.00(H)  Sodium 135 - 145 mmol/L 136 - 130(L)  Potassium 3.5 - 5.1 mmol/L 4.2 - 4.7  Chloride 101 - 111 mmol/L 109 - 99(L)  CO2 22 - 32 mmol/L 19(L) - 20(L)  Calcium 8.9 - 10.3 mg/dL 8.2(L) - 8.9  Total Protein 6.5 - 8.1 g/dL 5.9(L) 7.2 7.2  Total Bilirubin 0.3 - 1.2 mg/dL 0.8 1.2 1.2  Alkaline Phos 38 - 126 U/L 174(H) 227(H) 222(H)  AST 15 - 41 U/L 19 31 31   ALT 14 - 54 U/L 23 35 35    Urine Studies No results for input(s): UHGB, CRYS in the last 72 hours.  Invalid input(s): UACOL, UAPR, USPG, UPH, UTP, UGL, UKET, UBIL, UNIT, UROB, ULEU, UEPI, UWBC,  URBC, UBAC, CAST, UCOM, BILUA  Basic Metabolic Panel:  Recent Labs Lab 08/12/16 0154 08/13/16 0346  NA 130* 136  K 4.7  4.7 4.2  CL 99* 109  CO2 20* 19*  GLUCOSE 161* 86  BUN 39* 34*  CREATININE 2.00* 1.71*  CALCIUM 8.9 8.2*  MG 1.7  --    GFR Estimated Creatinine Clearance: 31.6 mL/min (A) (by C-G formula based on SCr of 1.71 mg/dL (H)). Liver Function Tests:  Recent Labs Lab 08/12/16 0154 08/13/16 0346  AST 31  31 19   ALT 35  35 23  ALKPHOS 227*  222* 174*  BILITOT 1.2  1.2 0.8  PROT 7.2  7.2 5.9*  ALBUMIN 3.2*  3.1* 2.6*    Recent Labs Lab 08/12/16 0154  LIPASE <10*   No results for input(s): AMMONIA in the last 168 hours. Coagulation profile No results for input(s): INR, PROTIME in the last 168 hours.  CBC:  Recent Labs Lab 08/12/16 0132 08/13/16 0346  WBC 1.2* 1.8*  NEUTROABS  --  0.9*  HGB 11.2* 10.3*  HCT 33.5* 30.4*  MCV 87.9 85.9  PLT 195 126*   Cardiac Enzymes: No results for input(s): CKTOTAL, CKMB, CKMBINDEX, TROPONINI in the last 168 hours. BNP: Invalid input(s): POCBNP CBG:  Recent Labs Lab 08/13/16 1558 08/13/16 1948 08/13/16 2351 08/14/16 0433 08/14/16 0736  GLUCAP 98 123* 95 75 87   D-Dimer No results for input(s): DDIMER in the last 72 hours. Hgb A1c No results for input(s): HGBA1C in the last 72 hours. Lipid Profile No results for input(s): CHOL, HDL, LDLCALC, TRIG, CHOLHDL, LDLDIRECT in the last 72 hours. Thyroid function studies No results for input(s): TSH, T4TOTAL, T3FREE, THYROIDAB in the last 72 hours.  Invalid input(s): FREET3 Anemia work up No results for input(s): VITAMINB12, FOLATE, FERRITIN, TIBC, IRON, RETICCTPCT in the last 72 hours. Microbiology Recent Results (from the past 240 hour(s))  Culture, Urine     Status: Abnormal   Collection Time: 08/12/16  4:42 AM  Result Value Ref Range Status   Specimen Description URINE, RANDOM  Final   Special Requests NONE  Final   Culture MULTIPLE  SPECIES PRESENT, SUGGEST RECOLLECTION (A)  Final   Report Status 08/13/2016 FINAL  Final  Culture, blood (routine x 2)     Status: None (Preliminary result)   Collection Time: 08/12/16  7:56 AM  Result Value Ref Range Status   Specimen Description BLOOD RIGHT HAND  Final   Special Requests IN PEDIATRIC BOTTLE Blood Culture adequate volume  Final   Culture   Final    NO GROWTH 1 DAY Performed at [facility], [place] [address] [place], [place] [address]    Report Status PENDING  Incomplete  Culture, blood (routine x 2)     Status: None (Preliminary result)   Collection Time: 08/12/16  7:56 AM  Result Value Ref Range Status   Specimen Description BLOOD LEFT HAND  Final   Special Requests IN PEDIATRIC BOTTLE Blood Culture adequate volume  Final   Culture   Final    NO GROWTH 1 DAY Performed at [facility], [place] [address], [place], [place] [address]    Report Status PENDING  Incomplete  Gastrointestinal Panel by PCR , Stool     Status: None   Collection Time: 08/12/16  9:30 AM  Result Value Ref Range Status   Campylobacter species NOT DETECTED NOT DETECTED Final   Plesimonas shigelloides NOT DETECTED NOT DETECTED Final   Salmonella species NOT DETECTED NOT DETECTED Final   Yersinia enterocolitica NOT DETECTED NOT DETECTED Final   Vibrio species NOT DETECTED NOT DETECTED Final   Vibrio cholerae NOT DETECTED NOT DETECTED Final   Enteroaggregative E coli (EAEC) NOT DETECTED NOT DETECTED Final   Enteropathogenic E coli (EPEC) NOT DETECTED NOT DETECTED Final   Enterotoxigenic E coli (ETEC) NOT DETECTED NOT DETECTED Final   Shiga like toxin producing E coli (STEC) NOT DETECTED NOT DETECTED Final   Shigella/Enteroinvasive E coli (EIEC) NOT DETECTED NOT DETECTED Final   Cryptosporidium NOT DETECTED NOT DETECTED Final   Cyclospora cayetanensis NOT DETECTED NOT DETECTED Final   Entamoeba histolytica NOT DETECTED NOT DETECTED Final   Giardia lamblia NOT DETECTED NOT DETECTED Final    Adenovirus F40/41 NOT DETECTED NOT DETECTED Final   Astrovirus NOT DETECTED NOT DETECTED Final   Norovirus GI/GII NOT DETECTED NOT DETECTED Final   Rotavirus A NOT DETECTED NOT DETECTED Final   Sapovirus (I, II, IV, and V) NOT DETECTED NOT DETECTED  Final     Studies:  No results found.  Assessment: 61 y.o. female with past medical history of metastatic colon cancer to liver and lungs, on second line chemotherapy FOLFIRI, DM, CKD, presented to emergency room for intractable nausea and vomiting  1. Intractable nausea and vomiting, likely secondary to chemotherapy and underline cancer, improved  2. Metastatic colon cancer to liver and lungs, on second line palliative chemotherapy FOLFIRI, not tolerating well, with nausea and diarrhea 3. DM 4. CKD 5. Anorexia and weight loss 6. 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 UTI and colitis, negative blood, urine cultures and GI panel  Plan:  -Her nausea and vomiting have improved, continue supportive care and symptom management -slightly neuropenic, afebrile, ID work up negative so far  -will hold chemo for now. Her CT scan show probably lung mets progression, I have ordered PET scan for staging, plan to be done after discharge. I will likely change her chemo regimen if she recovers well and wants to continue treatment -I will follow up as needed when she is in the hospital.  -appreciate the excellent care from the hospitalist team   Truitt Merle, MD 08/14/2016  9:20 AM

## 2016-08-14 NOTE — Progress Notes (Signed)
PROGRESS NOTE    Michelle Erickson  JME:[id number] DOB: 1955-12-31 DOA: 08/11/2016 PCP: Dione Housekeeper, MD  Brief Narrative: 61 year old female with metastatic colon cancer on second line chemotherapy admitted with intractable nausea, vomiting, abdominal cramps and weakness. Last chemotherapy was 1 week ago on 7/18 Assessment & Plan:     Intractable nausea and vomiting -Felt to be secondary to recent chemotherapy -CT abdomen unremarkable for acute findings -clinically improving, stop Flagyl -advance diet, cut down IVF    Metastatic colon cancer to liver ([place]) -On chemotherapy followed by Dr.Feng, appreciate input    DM (diabetes mellitus), type 2 (HCC) -Continue half dose Lantus, we'll cut down dose further    CKD stage 3 due to type 2 diabetes mellitus (HCC) -Creatinine improving, slight bump today -appears euvolemic now, stop IVF   Asymptomatic bacteriuria -Urine culture with multiple species and clinically did not have any symptoms of UTI -Stopped ceftriaxone  Anemia/leukopenia -Secondary to chemotherapy -Neupogen if ANC trends down further  DVT prophylaxis:  lovenox Code Status: Full Code Family Communication: daughter at bedside Disposition Plan: Home when improved/tol diet  Consultants:   Onc Dr.Feng  Antimicrobials:   FLagyl 7/24   Subjective: Vomiting improving, stools more formed too  Objective: Vitals:   08/13/16 0927 08/13/16 1410 08/13/16 2132 08/14/16 0605  BP: 138/70 (!) 119/56 (!) 143/62 132/60  Pulse:  84 91 86  Resp:  16 14 12   Temp:  98.6 F (37 C) 99.8 F (37.7 C) 98.1 F (36.7 C)  TempSrc:  Oral Oral Oral  SpO2:  99% 100% 100%  Weight:      Height:        Intake/Output Summary (Last 24 hours) at 08/14/16 1349 Last data filed at 08/14/16 1349  Gross per 24 hour  Intake          1486.25 ml  Output                3 ml  Net          1483.25 ml   Filed Weights   08/11/16 2336 08/12/16 0650  Weight: 66.7 kg (147 lb) 66.7 kg (147 lb)     Examination:  General exam: Appears calm and comfortable, chronically ill appearing Respiratory system: Clear to auscultation. Respiratory effort normal. Cardiovascular system: S1 & S2 heard, RRR. No JVD Gastrointestinal system: Abdomen is nondistended, soft and nontender.Normal bowel sounds heard. Central nervous system: Alert and oriented. No focal neurological deficits. Extremities: Symmetric 5 x 5 power. Skin: No rashes, lesions or ulcers Psychiatry: Judgement and insight appear normal. Mood & affect appropriate.     Data Reviewed:   CBC:  Recent Labs Lab 08/12/16 0132 08/13/16 0346 08/14/16 1026  WBC 1.2* 1.8* 2.6*  NEUTROABS  --  0.9*  --   HGB 11.2* 10.3* 9.2*  HCT 33.5* 30.4* 28.3*  MCV 87.9 85.9 88.7  PLT 195 126* 229*   Basic Metabolic Panel:  Recent Labs Lab 08/12/16 0154 08/13/16 0346 08/14/16 1026  NA 130* 136 138  K 4.7  4.7 4.2 3.9  CL 99* 109 112*  CO2 20* 19* 19*  GLUCOSE 161* 86 133*  BUN 39* 34* 27*  CREATININE 2.00* 1.71* 1.95*  CALCIUM 8.9 8.2* 8.1*  MG 1.7  --   --    GFR: Estimated Creatinine Clearance: 27.7 mL/min (A) (by C-G formula based on SCr of 1.95 mg/dL (H)). Liver Function Tests:  Recent Labs Lab 08/12/16 0154 08/13/16 0346  AST 31  31 19  ALT 35  35 23  ALKPHOS 227*  222* 174*  BILITOT 1.2  1.2 0.8  PROT 7.2  7.2 5.9*  ALBUMIN 3.2*  3.1* 2.6*    Recent Labs Lab 08/12/16 0154  LIPASE <10*   No results for input(s): AMMONIA in the last 168 hours. Coagulation Profile: No results for input(s): INR, PROTIME in the last 168 hours. Cardiac Enzymes: No results for input(s): CKTOTAL, CKMB, CKMBINDEX, TROPONINI in the last 168 hours. BNP (last 3 results) No results for input(s): PROBNP in the last 8760 hours. HbA1C: No results for input(s): HGBA1C in the last 72 hours. CBG:  Recent Labs Lab 08/13/16 1948 08/13/16 2351 08/14/16 0433 08/14/16 0736 08/14/16 1135  GLUCAP 123* 95 75 87 91   Lipid  Profile: No results for input(s): CHOL, HDL, LDLCALC, TRIG, CHOLHDL, LDLDIRECT in the last 72 hours. Thyroid Function Tests: No results for input(s): TSH, T4TOTAL, FREET4, T3FREE, THYROIDAB in the last 72 hours. Anemia Panel: No results for input(s): VITAMINB12, FOLATE, FERRITIN, TIBC, IRON, RETICCTPCT in the last 72 hours. Urine analysis:    Component Value Date/Time   COLORURINE AMBER (A) 08/12/2016 0442   APPEARANCEUR CLOUDY (A) 08/12/2016 0442   LABSPEC 1.014 08/12/2016 0442   PHURINE 5.0 08/12/2016 0442   GLUCOSEU NEGATIVE 08/12/2016 0442   HGBUR MODERATE (A) 08/12/2016 0442   BILIRUBINUR NEGATIVE 08/12/2016 0442   KETONESUR NEGATIVE 08/12/2016 0442   PROTEINUR >=300 (A) 08/12/2016 0442   UROBILINOGEN 1.0 08/11/2014 2224   NITRITE NEGATIVE 08/12/2016 0442   LEUKOCYTESUR MODERATE (A) 08/12/2016 0442   Sepsis Labs: @LABRCNTIP (procalcitonin:4,lacticidven:4)  ) Recent Results (from the past 240 hour(s))  Culture, Urine     Status: Abnormal   Collection Time: 08/12/16  4:42 AM  Result Value Ref Range Status   Specimen Description URINE, RANDOM  Final   Special Requests NONE  Final   Culture MULTIPLE SPECIES PRESENT, SUGGEST RECOLLECTION (A)  Final   Report Status 08/13/2016 FINAL  Final  Culture, blood (routine x 2)     Status: None (Preliminary result)   Collection Time: 08/12/16  7:56 AM  Result Value Ref Range Status   Specimen Description BLOOD RIGHT HAND  Final   Special Requests IN PEDIATRIC BOTTLE Blood Culture adequate volume  Final   Culture   Final    NO GROWTH 2 DAYS Performed at [facility], [place] [address], [place], [place] [address]    Report Status PENDING  Incomplete  Culture, blood (routine x 2)     Status: None (Preliminary result)   Collection Time: 08/12/16  7:56 AM  Result Value Ref Range Status   Specimen Description BLOOD LEFT HAND  Final   Special Requests IN PEDIATRIC BOTTLE Blood Culture adequate volume  Final   Culture   Final    NO  GROWTH 2 DAYS Performed at [facility], [place] [address], [place], [place] [address]    Report Status PENDING  Incomplete  Gastrointestinal Panel by PCR , Stool     Status: None   Collection Time: 08/12/16  9:30 AM  Result Value Ref Range Status   Campylobacter species NOT DETECTED NOT DETECTED Final   Plesimonas shigelloides NOT DETECTED NOT DETECTED Final   Salmonella species NOT DETECTED NOT DETECTED Final   Yersinia enterocolitica NOT DETECTED NOT DETECTED Final   Vibrio species NOT DETECTED NOT DETECTED Final   Vibrio cholerae NOT DETECTED NOT DETECTED Final   Enteroaggregative E coli (EAEC) NOT DETECTED NOT DETECTED Final  Enteropathogenic E coli (EPEC) NOT DETECTED NOT DETECTED Final   Enterotoxigenic E coli (ETEC) NOT DETECTED NOT DETECTED Final   Shiga like toxin producing E coli (STEC) NOT DETECTED NOT DETECTED Final   Shigella/Enteroinvasive E coli (EIEC) NOT DETECTED NOT DETECTED Final   Cryptosporidium NOT DETECTED NOT DETECTED Final   Cyclospora cayetanensis NOT DETECTED NOT DETECTED Final   Entamoeba histolytica NOT DETECTED NOT DETECTED Final   Giardia lamblia NOT DETECTED NOT DETECTED Final   Adenovirus F40/41 NOT DETECTED NOT DETECTED Final   Astrovirus NOT DETECTED NOT DETECTED Final   Norovirus GI/GII NOT DETECTED NOT DETECTED Final   Rotavirus A NOT DETECTED NOT DETECTED Final   Sapovirus (I, II, IV, and V) NOT DETECTED NOT DETECTED Final         Radiology Studies: No results found.      Scheduled Meds: . amLODipine  10 mg Oral q morning - 10a  . atorvastatin  40 mg Oral QPM  . enoxaparin (LOVENOX) injection  30 mg Subcutaneous Q24H  . ferrous sulfate  325 mg Oral Q breakfast  . insulin aspart  0-9 Units Subcutaneous Q4H  . insulin glargine  8 Units Subcutaneous QHS   Continuous Infusions:    LOS: 2 days    Time spent: 71min  Domenic Polite, MD [facility] Page via [email or web address] password [email or web address]  If 7PM-7AM, please contact  night-coverage [email or web address] Password [email or web address] 08/14/2016, 1:49 PM

## 2016-08-15 ENCOUNTER — Other Ambulatory Visit: Payer: Self-pay | Admitting: Hematology

## 2016-08-15 LAB — BASIC METABOLIC PANEL
Anion gap: 6 (ref 5–15)
BUN: 25 mg/dL — AB (ref 6–20)
CHLORIDE: 113 mmol/L — AB (ref 101–111)
CO2: 19 mmol/L — ABNORMAL LOW (ref 22–32)
CREATININE: 1.91 mg/dL — AB (ref 0.44–1.00)
Calcium: 7.9 mg/dL — ABNORMAL LOW (ref 8.9–10.3)
GFR calc Af Amer: 31 mL/min — ABNORMAL LOW (ref >60)
GFR calc non Af Amer: 27 mL/min — ABNORMAL LOW (ref >60)
Glucose, Bld: 123 mg/dL — ABNORMAL HIGH (ref 65–99)
Potassium: 3.9 mmol/L (ref 3.5–5.1)
SODIUM: 138 mmol/L (ref 135–145)

## 2016-08-15 LAB — GLUCOSE, CAPILLARY
GLUCOSE-CAPILLARY: 92 mg/dL (ref 65–99)
Glucose-Capillary: 114 mg/dL — ABNORMAL HIGH (ref 65–99)
Glucose-Capillary: 80 mg/dL (ref 65–99)

## 2016-08-15 MED ORDER — INSULIN GLARGINE 100 UNIT/ML SOLOSTAR PEN: 10.0000 [IU] | PEN_INJECTOR | Freq: Every day | SUBCUTANEOUS | Status: DC

## 2016-08-15 NOTE — Progress Notes (Signed)
Patient d/c home. Stable. 

## 2016-08-15 NOTE — Discharge Summary (Signed)
Physician Discharge Summary  Michelle Erickson TFT:[id number] DOB: [birth year] DOA: 08/11/2016  PCP: Dione Housekeeper, MD  Admit date: 08/11/2016 Discharge date: 08/15/2016  Time spent: 35 minutes  Recommendations for Outpatient Follow-up:  1. PCP in 1 week 2. Oncology Dr.Feng in 1-2weeks   Discharge Diagnoses:  Principal Problem:   Intractable nausea and vomiting Active Problems:   Metastatic colon cancer to liver (HCC)   DM (diabetes mellitus), type 2 ([place])   CKD stage 3 due to type 2 diabetes mellitus [facility])  Discharge Condition: stable  Diet recommendation: diabetic heart healthy  Filed Weights   08/11/16 2336 08/12/16 0650  Weight: 66.7 kg (147 lb) 66.7 kg (147 lb)    History of present illness:  61 year old female with metastatic colon cancer on second line chemotherapy admitted with intractable nausea, vomiting, abdominal cramps and weakness. Last chemotherapy was 1 week ago on 7/18  Hospital Course:    Intractable nausea and vomiting -Felt to be secondary to recent chemotherapy -CT abdomen unremarkable for acute findings -clinically improved with supportive care, bowel rest, IVF, antiemetics -improved and tolerating a regular diet at the time of discharge    Metastatic colon cancer to liver ([place]) -On chemotherapy followed by Dr.Feng,  -plan to hold chemo for now    DM (diabetes mellitus), type 2 ([place]) -Cut down home dose of lantus based on CBGs this admission    CKD stage 3 due to type 2 diabetes mellitus ([place]) -Creatinine slightly improved with hydration and back close to baseline of 1.7, it was 1.9 at discharge   Asymptomatic bacteriuria -Urine culture with multiple species and clinically did not have any symptoms of UTI -Stopped ceftriaxone  Anemia/leukopenia -Secondary to chemotherapy -stable now  Consultations:  Onc Dr.Feng  Discharge Exam: Vitals:   08/15/16 0552 08/15/16 0946  BP: 140/60 122/70  Pulse: 88   Resp: 12   Temp: 98.7 F (37.1  C)     General: AAOx3 Cardiovascular: S1S2/RRR Respiratory: CTAB  Discharge Instructions   Discharge Instructions    Diet - low sodium heart healthy    Complete by:  As directed    Increase activity slowly    Complete by:  As directed      Discharge Medication List as of 08/15/2016 10:18 AM    CONTINUE these medications which have CHANGED   Details  Insulin Glargine (LANTUS SOLOSTAR) 100 UNIT/ML Solostar Pen Inject 10 Units into the skin daily at 10 pm., Starting Fri 08/15/2016, No Print      CONTINUE these medications which have NOT CHANGED   Details  amLODipine (NORVASC) 10 MG tablet Take 10 mg by mouth every morning. , Historical Med    atorvastatin (LIPITOR) 40 MG tablet Take 40 mg by mouth every evening., Historical Med    diphenoxylate-atropine (LOMOTIL) 2.5-0.025 MG tablet Take 1-8 tablets by mouth 4 (four) times daily as needed for diarrhea or loose stools., Starting Wed 04/16/2016, Print    ferrous sulfate 325 (65 FE) MG tablet Take 325 mg by mouth daily with breakfast., Until Discontinued, Historical Med    insulin lispro (HUMALOG) 100 UNIT/ML injection Inject 4-8 Units into the skin 2 (two) times daily as needed for high blood sugar. Sliding scale, Until Discontinued, Historical Med    lidocaine-prilocaine (EMLA) cream Apply 1 application topically once. , Starting Tue 08/08/2014, Historical Med    promethazine (PHENERGAN) 25 MG tablet Take 0.5-1 tablets (12.5-25 mg total) by mouth every 8 (eight) hours as needed for nausea or vomiting., Starting Thu 07/24/2016, Print  traMADol (ULTRAM) 50 MG tablet Take 1 tablet (50 mg total) by mouth every 6 (six) hours as needed., Starting Wed 06/11/2016, Print       No Known Allergies Follow-up Information    Truitt Merle, MD. Schedule an appointment as soon as possible for a visit in 1 week(s).   Specialties:  Hematology, Oncology Contact information: [place] [address] [phone]             The results of significant diagnostics from this hospitalization (including imaging, microbiology, ancillary and laboratory) are listed below for reference.    Significant Diagnostic Studies: Dg Chest Portable 1 View  Result Date: 08/12/2016 CLINICAL DATA:  Vomiting, nausea and hematochezia since yesterday. EXAM: PORTABLE CHEST 1 VIEW COMPARISON:  05/26/2016, 01/28/2016 FINDINGS: Left-sided port terminates in the SVC just below the azygos vein junction. No focal airspace consolidation or alveolar edema. The lungs are grossly clear. There is no large effusion or pneumothorax. Cardiac and mediastinal contours appear unremarkable. IMPRESSION: No acute cardiopulmonary findings. Electronically Signed   By: Andreas Newport M.D.   On: 08/12/2016 02:00   Ct Renal Stone Study  Result Date: 08/12/2016 CLINICAL DATA:  Lower abdominal pain. History of metastatic colon cancer. EXAM: CT ABDOMEN AND PELVIS WITHOUT CONTRAST TECHNIQUE: Multidetector CT imaging of the abdomen and pelvis was performed following the standard protocol without IV contrast. COMPARISON:  PET CT 05/26/2016 FINDINGS: Lower chest: 16 x 13 mm left lower lobe pulmonary nodule has increased in size from 14 x 9 mm previously. Hepatobiliary: Hepatic lesions demonstrated on recent PET CT are not clearly visible on this noncontrast study. Large lamellated gallstones are present in the gallbladder. Pancreas: Normal contours without ductal dilatation. No peripancreatic fluid collection. Spleen: Normal. Adrenals/Urinary Tract: --Adrenal glands: Normal. --Right kidney/ureter: No hydronephrosis or perinephric stranding. No nephrolithiasis. No obstructing ureteral stones. --Left kidney/ureter: No hydronephrosis or perinephric stranding. No nephrolithiasis. No obstructing ureteral stones. --Urinary bladder: Unremarkable. Stomach/Bowel: --Stomach/Duodenum: No hiatal hernia or other gastric abnormality. Normal duodenal course and caliber. --Small bowel: No  dilatation or inflammation. --Colon: There is mild wall thickening of the distal descending and sigmoid colon. No surrounding inflammation. --Appendix: Not visualized. No right lower quadrant inflammation or free fluid. Vascular/Lymphatic: Atherosclerotic calcification is present within the non-aneurysmal abdominal aorta, without hemodynamically significant stenosis. No abdominal or pelvic lymphadenopathy. Reproductive: Normal uterus and ovaries. Musculoskeletal. Multilevel degenerative disc disease and facet arthrosis. No bony spinal canal stenosis. No lytic or blastic lesions. Grade 1 L4-L5 anterolisthesis secondary to facet hypertrophy. Other: None. IMPRESSION: 1. No acute abnormality of the abdomen or pelvis. 2. Predominantly fluid-filled proximal colon, compatible with diarrheal illness. Nonspecific mild colonic wall thickening of the distal descending and sigmoid colon is similar to the prior study. 3. Known hepatic metastases are not visible on this noncontrast study. 4. Increased size of left lower lobe pulmonary metastasis. 5.  Aortic Atherosclerosis (ICD10-I70.0). Electronically Signed   By: Ulyses Jarred M.D.   On: 08/12/2016 04:35    Microbiology: Recent Results (from the past 240 hour(s))  Culture, Urine     Status: Abnormal   Collection Time: 08/12/16  4:42 AM  Result Value Ref Range Status   Specimen Description URINE, RANDOM  Final   Special Requests NONE  Final   Culture MULTIPLE SPECIES PRESENT, SUGGEST RECOLLECTION (A)  Final   Report Status 08/13/2016 FINAL  Final  Culture, blood (routine x 2)     Status: None (Preliminary result)   Collection Time: 08/12/16  7:56 AM  Result Value Ref Range Status   Specimen Description BLOOD RIGHT HAND  Final   Special Requests IN PEDIATRIC BOTTLE Blood Culture adequate volume  Final   Culture   Final    NO GROWTH 3 DAYS Performed at [facility], [address] [place], [place] [address]    Report Status PENDING  Incomplete   Culture, blood (routine x 2)     Status: None (Preliminary result)   Collection Time: 08/12/16  7:56 AM  Result Value Ref Range Status   Specimen Description BLOOD LEFT HAND  Final   Special Requests IN PEDIATRIC BOTTLE Blood Culture adequate volume  Final   Culture   Final    NO GROWTH 3 DAYS Performed at [facility], [address] [place], [place] [address]    Report Status PENDING  Incomplete  Gastrointestinal Panel by PCR , Stool     Status: None   Collection Time: 08/12/16  9:30 AM  Result Value Ref Range Status   Campylobacter species NOT DETECTED NOT DETECTED Final   Plesimonas shigelloides NOT DETECTED NOT DETECTED Final   Salmonella species NOT DETECTED NOT DETECTED Final   Yersinia enterocolitica NOT DETECTED NOT DETECTED Final   Vibrio species NOT DETECTED NOT DETECTED Final   Vibrio cholerae NOT DETECTED NOT DETECTED Final   Enteroaggregative E coli (EAEC) NOT DETECTED NOT DETECTED Final   Enteropathogenic E coli (EPEC) NOT DETECTED NOT DETECTED Final   Enterotoxigenic E coli (ETEC) NOT DETECTED NOT DETECTED Final   Shiga like toxin producing E coli (STEC) NOT DETECTED NOT DETECTED Final   Shigella/Enteroinvasive E coli (EIEC) NOT DETECTED NOT DETECTED Final   Cryptosporidium NOT DETECTED NOT DETECTED Final   Cyclospora cayetanensis NOT DETECTED NOT DETECTED Final   Entamoeba histolytica NOT DETECTED NOT DETECTED Final   Giardia lamblia NOT DETECTED NOT DETECTED Final   Adenovirus F40/41 NOT DETECTED NOT DETECTED Final   Astrovirus NOT DETECTED NOT DETECTED Final   Norovirus GI/GII NOT DETECTED NOT DETECTED Final   Rotavirus A NOT DETECTED NOT DETECTED Final   Sapovirus (I, II, IV, and V) NOT DETECTED NOT DETECTED Final     Labs: Basic Metabolic Panel:  Recent Labs Lab 08/12/16 0154 08/13/16 0346 08/14/16 1026 08/15/16 0356  NA 130* 136 138 138  K 4.7  4.7 4.2 3.9 3.9  CL 99* 109 112* 113*  CO2 20* 19* 19* 19*  GLUCOSE 161* 86 133* 123*  BUN  39* 34* 27* 25*  CREATININE 2.00* 1.71* 1.95* 1.91*  CALCIUM 8.9 8.2* 8.1* 7.9*  MG 1.7  --   --   --    Liver Function Tests:  Recent Labs Lab 08/12/16 0154 08/13/16 0346  AST 31  31 19   ALT 35  35 23  ALKPHOS 227*  222* 174*  BILITOT 1.2  1.2 0.8  PROT 7.2  7.2 5.9*  ALBUMIN 3.2*  3.1* 2.6*    Recent Labs Lab 08/12/16 0154  LIPASE <10*   No results for input(s): AMMONIA in the last 168 hours. CBC:  Recent Labs Lab 08/12/16 0132 08/13/16 0346 08/14/16 1026  WBC 1.2* 1.8* 2.6*  NEUTROABS  --  0.9*  --   HGB 11.2* 10.3* 9.2*  HCT 33.5* 30.4* 28.3*  MCV 87.9 85.9 88.7  PLT 195 126* 139*   Cardiac Enzymes: No results for input(s): CKTOTAL, CKMB, CKMBINDEX, TROPONINI in the last 168 hours. BNP: BNP (last 3 results) No results for input(s): BNP in the last 8760 hours.  ProBNP (last 3 results) No results for input(s): PROBNP in the last 8760 hours.  CBG:  Recent Labs Lab 08/14/16 2001 08/14/16 2346 08/15/16 0052 08/15/16 0406 08/15/16 0721  GLUCAP 94 68 92 114* 80       Signed:  Dalisha Shively MD.  [facility] 08/15/2016, 2:35 PM

## 2016-08-15 NOTE — Progress Notes (Signed)
Reviewed discharge instructions with daughter, verbalized understanding. All questions answered appropriately. Patient refused home health.

## 2016-08-15 NOTE — Progress Notes (Signed)
Physical Therapy Treatment Patient Details Name: Michelle Erickson MRN: [id number] DOB: 12-Apr-1955 Today's Date: 08/15/2016    History of Present Illness 61 y.o. female with past medical history of metastatic colon cancer to liver and lungs, on second line chemotherapy, DM, CKD, presented to emergency room for intractable nausea and vomiting and diarrhea    PT Comments    The patient is noted to have decreased balance  Without a RW. Encouraged HHPT, patient declined. Daughter present and aware. . RN also aware of decline of HHPT.  Follow Up Recommendations  Home health PT;Supervision/Assistance - 24 hour (patient declines HHPT)     Equipment Recommendations   (does not want RW.)    Recommendations for Other Services       Precautions / Restrictions Precautions Precautions: Fall Restrictions Weight Bearing Restrictions: No    Mobility  Bed Mobility               General bed mobility comments: in recliner  Transfers Overall transfer level: Needs assistance Equipment used: Rolling walker (2 wheeled);None Transfers: Sit to/from Stand Sit to Stand: Supervision         General transfer comment: cues for safety.  Ambulation/Gait Ambulation/Gait assistance: Min assist Ambulation Distance (Feet): 440 Feet Assistive device: Rolling walker (2 wheeled);None Gait Pattern/deviations: Step-through pattern;Staggering right;Staggering left     General Gait Details: the patient ambulated x 100' with RW. No AD for remainder of distance. Noted balance loss several times, especially when turning the corner, look to sides that required external support by PT to regain balance.   Stairs            Wheelchair Mobility    Modified Rankin (Stroke Patients Only)       Balance Overall balance assessment: Needs assistance Sitting-balance support: No upper extremity supported;Feet supported Sitting balance-Leahy Scale: Good     Standing balance support: During functional  activity;No upper extremity supported Standing balance-Leahy Scale: Poor                              Cognition Arousal/Alertness: Awake/alert                                            Exercises      General Comments        Pertinent Vitals/Pain Pain Assessment: No/denies pain    Home Living                      Prior Function            PT Goals (current goals can now be found in the care plan section) Progress towards PT goals: Progressing toward goals    Frequency    Min 3X/week      PT Plan Current plan remains appropriate    Co-evaluation              AM-PAC PT "6 Clicks" Daily Activity  Outcome Measure  Difficulty turning over in bed (including adjusting bedclothes, sheets and blankets)?: None Difficulty moving from lying on back to sitting on the side of the bed? : None Difficulty sitting down on and standing up from a chair with arms (e.g., wheelchair, bedside commode, etc,.)?: Total Help needed moving to and from a bed to chair (including a wheelchair)?: Total Help needed walking in hospital  room?: Total Help needed climbing 3-5 steps with a railing? : Total 6 Click Score: 12    End of Session Equipment Utilized During Treatment: Gait belt Activity Tolerance: Patient tolerated treatment well Patient left: in chair;with call bell/phone within reach;with family/visitor present Nurse Communication: Mobility status PT Visit Diagnosis: Difficulty in walking, not elsewhere classified (R26.2)     Time: [id number] PT Time Calculation (min) (ACUTE ONLY): 15 min  Charges:  $Gait Training: 8-22 mins                    G CodesTresa Endo PT [phone]    Claretha Cooper 08/15/2016, 11:38 AM

## 2016-08-16 ENCOUNTER — Telehealth: Payer: Self-pay | Admitting: Hematology

## 2016-08-16 NOTE — Telephone Encounter (Signed)
S/w pt, advised 8/1 appts cx'd per msg from md. Advised her radiology will call to schedule PET scan. Msg to md to enter order for PET.

## 2016-08-17 LAB — CULTURE, BLOOD (ROUTINE X 2)
Culture: NO GROWTH
Culture: NO GROWTH
SPECIAL REQUESTS: ADEQUATE
Special Requests: ADEQUATE

## 2016-08-20 ENCOUNTER — Other Ambulatory Visit: Payer: BLUE CROSS/BLUE SHIELD

## 2016-08-20 ENCOUNTER — Ambulatory Visit: Payer: BLUE CROSS/BLUE SHIELD | Admitting: Nurse Practitioner

## 2016-08-20 ENCOUNTER — Ambulatory Visit: Payer: BLUE CROSS/BLUE SHIELD

## 2016-08-21 ENCOUNTER — Ambulatory Visit: Payer: BLUE CROSS/BLUE SHIELD

## 2016-08-25 ENCOUNTER — Other Ambulatory Visit: Payer: Self-pay | Admitting: Hematology

## 2016-08-25 DIAGNOSIS — C787 Secondary malignant neoplasm of liver and intrahepatic bile duct: Principal | ICD-10-CM

## 2016-08-25 DIAGNOSIS — C189 Malignant neoplasm of colon, unspecified: Secondary | ICD-10-CM

## 2016-09-02 ENCOUNTER — Telehealth: Payer: Self-pay | Admitting: Hematology

## 2016-09-02 NOTE — Telephone Encounter (Signed)
Due to pet scan not until 8/21 moved 8/15 lab/flush/YF to 8/22. Spoke with patient she is aware. Per patient she was contacted re coming for pet 8/14, however she needs more notice than 24 hrs and scheduled pet for 8/21.

## 2016-09-03 ENCOUNTER — Other Ambulatory Visit: Payer: BLUE CROSS/BLUE SHIELD

## 2016-09-03 ENCOUNTER — Ambulatory Visit: Payer: BLUE CROSS/BLUE SHIELD

## 2016-09-03 ENCOUNTER — Ambulatory Visit: Payer: BLUE CROSS/BLUE SHIELD | Admitting: Hematology

## 2016-09-04 NOTE — Progress Notes (Signed)
[place]  [phone] [phone]  Clinic Follow Up Note   Patient Care Team: Dione Housekeeper, MD as PCP - General (Family Medicine) Tania Ade, RN as Registered Nurse (Oncology) 09/10/2016   CHIEF COMPLAINTS:  Follow-up of metastatic colon cancer    Oncology History   7Metastatic colon cancer to liver   Staging form: Colon and Rectum, AJCC 7th Edition     Clinical: Stage Unknown ([place], NX, M1) - Unsigned        Metastatic colon cancer to liver ([place])   07/14/2014 Imaging    CT abdomen and pelvis with contrast showed a 6 cm segment of sigmoid bowel wall thickening with adjacent 14 x 12 mm nodules, diffuse hepatic metastasis. No bowel obstruction.      07/14/2014 Initial Diagnosis    Metastatic colon cancer to liver      07/25/2014 Procedure    Colonoscopy showed a near circumferential medial mass in the sigmoid colon, partially obstructing consistent with carcinoma.      07/25/2014 Initial Biopsy    Sigmoid: Biopsy showed invasive adenocarcinoma arising in a background of high-grade dysplasia.      07/28/2014 Imaging    CT chest showed no evidence of metastasis      08/09/2014 - 01/16/2016 Chemotherapy    mFOLFOX6 every 2 weeks, dose reduction from cycle 7 due to cytopenia, Avastin added from cycle 3, stopped due to disease progression       08/11/2014 - 08/15/2014 Hospital Admission    Pt was admitted for fever and nausea, treated for UTI, liver biopsy cancelled due to the infection       10/29/2015 Imaging    CT chest, abdomen and pelvis with contrast 10/29/2015 IMPRESSION: 1. New/enlarging pulmonary nodules, most notably the 8 by 6 mm left lower lobe pulmonary nodule, favoring metastatic disease. 2. The liver lesions are subjectively grossly similar in size, difficult to measure due to the lack of IV contrast causing poor boundaries. 3. There is some faint heterogeneity of bony density  in the sternum, but this is not significantly changed from 07/28/2014 and is probably incidental given the lack of other evidence of osseous metastatic disease. 4. Lower lumbar spondylosis and degenerative disc disease causing impingement. 5. Coronary, aortic arch, and branch vessel atherosclerotic vascular disease. Aortoiliac atherosclerotic vascular disease. 6. Mildly dilated esophagus with air-fluid level, probably reflecting dysmotility. 7. Cholelithiasis. 8. Thick wall urinary bladder, cystitis not excluded.      01/28/2016 Progression    Restaging CT showed disease progression in lungs, and probably progression in liver mets also      02/14/2016 PET scan    IMPRESSION: 1. Examination is positive for hypermetabolic tumor within both lobes of liver. Index measurements provided above. 2. There are scattered pulmonary nodules within both lungs, most of these are too small to reliably characterize by PET-CT. In the left lower lobe there is a lung nodule which exhibits malignant range FDG uptake measuring 9 mm. 3. Aortic atherosclerosis and coronary artery calcifications.      02/20/2016 -  Chemotherapy    Second line chemo FOLFIRI and Avastin, every 2 weeks       03/28/2016 Imaging    CT Abdomen pelvis without contrast 03/28/2016 IMPRESSION: 1. Diffuse circumferential wall thickening throughout the colon, suggesting acute colitis. Intraluminal fluid density compatible with associated diarrheal illness. 2. Superimposed nodular area of bowel wall thickening within the sigmoid colon suspected to be related to patient's known primary adenocarcinoma. 3. Poor visualization of  known hepatic metastases. No other evidence for metastatic disease within the abdomen and pelvis. Previously identified pulmonary nodules within the left lower and right middle lobes have slightly increased in size, concerning for slightly progressive metastatic disease. 4. Cholelithiasis. 5. Diffuse  atherosclerosis with 3 vessel coronary artery calcifications.      05/26/2016 PET scan    PET 05/26/16: IMPRESSION: 1. Overall relatively stable pulmonary and liver metastases. No new sites of hypermetabolic metastatic disease. 2. Dominant hypermetabolic left lower lobe lung metastasis is mildly increased in size and stable in metabolism. Additional subcentimeter solid pulmonary nodules are below PET resolution and not convincingly changed in size. 3. Hypermetabolic liver metastases are stable in number with mixed mild metabolic changes as detailed. 4. Additional findings include aortic atherosclerosis, coronary atherosclerosis and cholelithiasis.      06/08/2016 -  Hospital Admission    Patient presents to the ER due to nausea and dehydration       08/11/2016 - 08/15/2016 Hospital Admission    Admit date: 08/11/16 Admission diagnosis: intractable nausea and vomiting secondary to chemotherapy and underlying cancer Additional comments: I will likely change her chemo regimen if she recovers well and wants to continue treatment      09/09/2016 PET scan    1. Significant extravasation of radiopharmaceutical into the right upper arm may limit the sensitivity of this examination. 2. Progressive bilateral pulmonary metastatic disease. The dominant lesion in the left lower lobe has increased in size, although demonstrates decreased metabolic activity compared with the prior study. 3. Intensity of activity within the multifocal hepatic metastatic disease has also improved. The individual lesions are ill-defined and difficult to measure on the noncontrast CT images. 4. Interval development of nodal metastases within the gastrohepatic ligament.       HISTORY OF PRESENTING ILLNESS:  Michelle Erickson 61 y.o. female is here because of abnormal CT findings which is highly suspecious for metastatic colon cancer.   She presented intermittent nausea, anorexia and abdominal pain since Nov 2015, she  has mild pain in the LUQ, crapy pain, positional (bending over),   it happened once every 2-3 weeks, and it has been more frequent in the past month. She has normal BM, but did notice the caliber of the stool is smaller than before. She denies any melena or hematochezia. She lost about 13 lbs in the past 6 months.   She presents to local emergency room in November 2015, and May 2016, was felt to be related to gastric virus. Due to the worsening symptoms lately, she went to [facility] ED on 07/13/2014, CT abdomen was obtained which showed multiple large liver metastasis and probable sigmoid colon mass. She was referred to [place] for further workup. She is scheduled to see GI Dr. Olevia Perches this afternoon.  CURRENT THERAPY: pending third line therapy with Lonsurf    INTERIM HISTORY:  Melanny returns for follow-up. Since our last visit, the patient presented to the ED on 08/11/2016 complaining of waxing / waning, gradually worsening abdominal pain with associated nausea, vomiting, and diarrhea. She was admitted to the hospital at that time and discharged on 08/15/2016. States she feels a whole lot better today. It took her about one week to recover after going to the ED. She has been eating well and has been have normal bowel movements. She has about three bowel movements daily which are soft, but not loose or runny. She reports vomiting once or twice since her ED visit. She still reports occasional abdominal pain. States  that since the blood transfusion, she doesn't get cold as much. She denies cough, fever, or mouth sores. She does not need any refills at this time.   MEDICAL HISTORY Past Medical History:  Diagnosis Date  . Back pain   . Diabetes mellitus without complication ([place])   . Family history of colon cancer   . Hyperlipemia   . Hypertension   . Metastatic colon cancer to liver ([place]) 07/21/2014  . Snoring   . Wears glasses    contacts/glasses    SURGICAL HISTORY: Past Surgical History:    Procedure Laterality Date  . CATARACT EXTRACTION Bilateral   . MOUTH SURGERY    . PORTACATH PLACEMENT N/A 08/03/2014   Procedure: INSERTION PORT-A-CATH;  Surgeon: Stark Klein, MD;  Location: [facility];  Service: General;  Laterality: N/A;    SOCIAL HISTORY: Social History   Social History  . Marital status: Married    Spouse name: Charlotte Crumb  . Number of children: 2  . Years of education: N/A   Occupational History  . Not on file.   Social History Main Topics  . Smoking status: Former Smoker    Packs/day: 1.00    Years: 6.00    Types: Cigarettes    Quit date: 01/21/1976  . Smokeless tobacco: Never Used  . Alcohol use No  . Drug use: No  . Sexual activity: Yes    Birth control/ protection: Post-menopausal     Comment: # 2 pregnancies and #2 live births   Other Topics Concern  . Not on file   Social History Narrative   Married, husband [name]   Has #2 adult children   Previously worked at [employer] HISTORY: Family History  Problem Relation Age of Onset  . COPD Father   . Heart attack Father   . Arthritis Sister   . Diabetes Sister   . Colon cancer Sister        dx over 88  . Colon polyps Sister   . Seizures Brother   . Colon cancer Sister        dx in her early 5s, died in her early to mid 41s  . Diabetes Mother     ALLERGIES:  has No Known Allergies.  MEDICATIONS:  Current Outpatient Prescriptions  Medication Sig Dispense Refill  . amLODipine (NORVASC) 10 MG tablet Take 10 mg by mouth every morning.     Marland Kitchen atorvastatin (LIPITOR) 40 MG tablet Take 40 mg by mouth every evening.    . diphenoxylate-atropine (LOMOTIL) 2.5-0.025 MG tablet Take 1-8 tablets by mouth 4 (four) times daily as needed for diarrhea or loose stools. 60 tablet 1  . ferrous sulfate 325 (65 FE) MG tablet Take 325 mg by mouth daily with breakfast.    . Insulin Glargine (LANTUS SOLOSTAR) 100 UNIT/ML Solostar Pen Inject 10 Units into the skin daily at 10 pm.    . insulin lispro  (HUMALOG) 100 UNIT/ML injection Inject 4-8 Units into the skin 2 (two) times daily as needed for high blood sugar. Sliding scale    . lidocaine-prilocaine (EMLA) cream Apply 1 application topically once.     . promethazine (PHENERGAN) 25 MG tablet Take 0.5-1 tablets (12.5-25 mg total) by mouth every 8 (eight) hours as needed for nausea or vomiting. 60 tablet 2  . traMADol (ULTRAM) 50 MG tablet Take 1 tablet (50 mg total) by mouth every 6 (six) hours as needed. (Patient taking differently: Take 50 mg by mouth every  6 (six) hours as needed for moderate pain. ) 30 tablet 1  . trifluridine-tipiracil (LONSURF) 20-8.19 MG tablet Take 2 tablets (40 mg of trifluridine total) by mouth 2 (two) times daily after a meal. 1 hr after AM & PM meals on days 1-5, 8-12. Repeat every 28day 20 tablet 0   No current facility-administered medications for this visit.    Facility-Administered Medications Ordered in Other Visits  Medication Dose Route Frequency Provider Last Rate Last Dose  . sodium chloride 0.9 % injection 10 mL  10 mL Intravenous PRN Truitt Merle, MD   10 mL at 05/14/16 0753    REVIEW OF SYSTEMS:   Constitutional: Denies fevers, chills or abnormal night sweats. (+) fatigue Eyes: Denies blurriness of vision, double vision or watery eyes Ears, nose, mouth, throat, and face: Denies mucositis or sore throat Respiratory: Denies cough, dyspnea or wheezes Cardiovascular: Denies palpitation, chest discomfort Gastrointestinal:  Denies heartburn (+) Nausea, vomiting, abdominal pain Skin: Denies abnormal skin rashes Lymphatics: Denies new lymphadenopathy or easy bruising Neurological:Denies numbness, tingling or new weaknesses Behavioral/Psych: Mood is stable, no new changes  All other systems were reviewed with the patient and are negative.  PHYSICAL EXAMINATION: ECOG PERFORMANCE STATUS: 1 BP (!) 157/64 (BP Location: Left Arm, Patient Position: Sitting)   Pulse 81   Temp 98.3 F (36.8 C) (Oral)   Resp  18   Ht _0  (1.626 m)   Wt 142 lb 1.6 oz (64.5 kg)   SpO2 100%   BMI 24.39 kg/m    GENERAL:alert, no distress and comfortable. SKIN: skin color, texture, turgor are normal, no rashes or significant lesions EYES: normal, conjunctiva are pink and non-injected, sclera clear OROPHARYNX:no exudate, no erythema and lips, buccal mucosa, and tongue normal  NECK: supple, thyroid normal size, non-tender, without nodularity LYMPH:  no palpable lymphadenopathy in the cervical, axillary or inguinal LUNGS: clear to auscultation and percussion with normal breathing effort HEART: regular rate & rhythm and no murmurs and no lower extremity edema ABDOMEN:abdomen soft, non-tender and normal bowel sounds Musculoskeletal:no cyanosis of digits and no clubbing  PSYCH: alert & oriented x 3 with fluent speech NEURO: no focal motor/sensory deficits  LABORATORY DATA:  I have reviewed the data as listed CBC Latest Ref Rng & Units 09/10/2016 08/14/2016 08/13/2016  WBC 3.9 - 10.3 10e3/uL 8.3 2.6(L) 1.8(L)  Hemoglobin 11.6 - 15.9 g/dL 9.0(L) 9.2(L) 10.3(L)  Hematocrit 34.8 - 46.6 % 28.7(L) 28.3(L) 30.4(L)  Platelets 145 - 400 10e3/uL 195 139(L) 126(L)   CMP Latest Ref Rng & Units 09/10/2016 08/15/2016 08/14/2016  Glucose 70 - 140 mg/dl 104 123(H) 133(H)  BUN 7.0 - 26.0 mg/dL 26.8(H) 25(H) 27(H)  Creatinine 0.6 - 1.1 mg/dL 1.4(H) 1.91(H) 1.95(H)  Sodium 136 - 145 mEq/L 136 138 138  Potassium 3.5 - 5.1 mEq/L 4.8 3.9 3.9  Chloride 101 - 111 mmol/L - 113(H) 112(H)  CO2 22 - 29 mEq/L 24 19(L) 19(L)  Calcium 8.4 - 10.4 mg/dL 9.7 7.9(L) 8.1(L)  Total Protein 6.4 - 8.3 g/dL 7.6 - -  Total Bilirubin 0.20 - 1.20 mg/dL 0.73 - -  Alkaline Phos 40 - 150 U/L 556(H) - -  AST 5 - 34 U/L 68(H) - -  ALT 0 - 55 U/L 55 - -    CEA (0-5ng/ml) 07/21/2014: 594.5 11/23/2014: 18.2 03/07/2015: 6.6 09/19/2015: 37.9 10/29/2015: 63.7 11/28/2015: 28.99 01/02/2016: 40.81 01/28/2015: 47 03/05/16: 62.66 04/16/16: 92.49 05/14/16:  96.62 06/11/16: 145.13 07/09/2016: 180.53 08/06/16: 372.79 09/10/16: PENDING   PATHOLOGY REPORTS: Diagnosis  07/25/2014  Surgical [P], sigmoid - INVASIVE ADENOCARCINOMA ARISING IN A BACKGROUND OF HIGH GRADE DYSPLASIA. - SEE COMMENT.  Diagnosis 09/05/2014 Liver, biopsy - METASTATIC ADENOCARCINOMA, SEE COMMENT.   RADIOGRAPHIC STUDIES: I have personally reviewed the radiological images as listed and agreed with the findings in the report.  PET 09/09/2016 IMPRESSION: 1. Significant extravasation of radiopharmaceutical into the right upper arm may limit the sensitivity of this examination. 2. Progressive bilateral pulmonary metastatic disease. The dominant lesion in the left lower lobe has increased in size, although demonstrates decreased metabolic activity compared with the prior study. 3. Intensity of activity within the multifocal hepatic metastatic disease has also improved. The individual lesions are ill-defined and difficult to measure on the noncontrast CT images. 4. Interval development of nodal metastases within the gastrohepatic ligament.  PET 05/26/16: IMPRESSION: 1. Overall relatively stable pulmonary and liver metastases. No new sites of hypermetabolic metastatic disease. 2. Dominant hypermetabolic left lower lobe lung metastasis is mildly increased in size and stable in metabolism. Additional subcentimeter solid pulmonary nodules are below PET resolution and not convincingly changed in size. 3. Hypermetabolic liver metastases are stable in number with mixed mild metabolic changes as detailed. 4. Additional findings include aortic atherosclerosis, coronary atherosclerosis and cholelithiasis.   PET 02/14/2016 IMPRESSION: 1. Examination is positive for hypermetabolic tumor within both lobes of liver. Index measurements provided above. 2. There are scattered pulmonary nodules within both lungs, most of these are too small to reliably characterize by PET-CT. In the  left lower lobe there is a lung nodule which exhibits malignant range FDG uptake measuring 9 mm. 3. Aortic atherosclerosis and coronary artery calcifications.  Colonoscopy 07/25/2014 Dr. Olevia Perches  ENDOSCOPIC IMPRESSION: Near circumferential medium mass was found in the sigmoid colon; multiple biopsies were performed using cold forceps, partially obstructing mass consistent with carcinoma. Placement of endoclips and tattoo at the margins of the mass which extends from 18-25 cm from the anus  ASSESSMENT & PLAN: 61 y.o. African-American female, presented with intermittent nausea vomiting and anemia.   1. 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 -The natural history of metastatic colon cancer and treatment options were discussed with patient and her husband. Giving the diffuse metastasis in the liver, this is unfortunately incurable disease. I recommend systemic chemotherapy to control her metastatic disease, the goal of therapy is palliative and to prolong her life. -I previously reviewed her restaging CT scan from 01/28/2016, which showed slight disease progression in lungs, especailly the LLL lung nodule. Due to lack of CT contrast, it is difficult to evaluate the metastasis in the liver, but it appears to be progression also. -I previously discussed her PET scan findings, which showed hypermetabolic multiple liver metastasis and a few lung metastasis.  -she is on second line chemo FOLFIRI and Avastin, tolerating well overall  -Due to her severe diarrhea from cycle 3 chemotherapy, I previously reduced Irinotecan dose slightly, she is tolerating much better. -Pet scan from 05/26/16 reviewed with  pt and her husband. It showed stable disease overall, mild slightly increased size of one lung met, no other new lesions, will continue chemo.  -Due to her persistent proteinuria and started worsening renal function, I will hold Avastin permanently from 07/24/2016 -We'll continue follow-up her  CEA tumor marker monthly. -she has been tolerating FOLFIRI poorly overall, with severe diarrhea which required hospital admission twice, despite dose reduction and aggressive antidiarrhea medications. So I recommend her to stop FOLFIRI.  - I discussed her 09/09/2016 PET findings, which showed progressive bilateral pulmonary metastatic disease. The dominant lesion in the left lower lobe has increased in size, although demonstrates decreased metabolic activity compared with the prior study. Intensity of activity within the multifocal hepatic metastatic disease has also improved, however she had suboptimals dose contrast due to contrast leakage. The individual lesions are ill-defined and difficult to measure on the non-contrast CT images. I think the scan overall showed stable disease/slight worse of lung metastasis.  -She has clinically recovered well from her recent hospitalization. Labs reviewed and Hb is 9.0, Cr has improved.  -I discussed third line option, which includes Xeloda or Lonsurf. We discussed the response rate is much lower, but since she has recovered well, I think she is a candidate for more treatment. The patient would prefer Lonsurf as it is less likely to cause diarrhea.  --Chemotherapy consent: Side effects including but does not not limited to, fatigue, nausea, vomiting, diarrhea, hair loss, neuropathy, fluid retention, renal and kidney dysfunction, neutropenic fever, needed for blood transfusion, bleeding, were discussed with patient in great detail. She agrees to proceed. -the goal of therapy is palliative -She will call our office when she receives this medication. -alternative option of  palliative care alone was also discussed with pt and her husband, pt would like to try more treatment at this point.   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 -Her ferritin was previously normal, serum iron and saturation slightly low, TIBC low normal, I suggest her to take oral iron supplement, she tolerates well, we'll continue once daily, with orange juice or vitamin C. -Iron 03/05/2016 was 64 Her HGB is 7.6 previously. I suggested 1 unit blood transfusion, she wants to wait for now - will continue monitoring  -Due to her Hb being 7.2 (08/06/16) she received a blood transfusion -I discussed her getting one dose IV iron to help keep up her levels. She agrees.   3. Abdominal pain and diarrhea -Likely secondary to chemotherapy, her underlying colon cancer may also contributes. -this has resolved since stopped chemo one month ago  -She will use tramadol as needed for pain   4. DM, HTN -Continue follow-up with PCP - we previously discussed the potential impact of chemotherapy and premedication dexamethasone on her blood pressure and sugar,will  Monitor closely. Her BG has been well controlled lately  -Her blood pressure has been high lately, we'll repeat in the infusion room. I previously encouraged her to measure her blood pressure at home. We previously discussed Avastin can increase her blood pressure, need to be monitored closely. If SBP persistently high than 150, I'll consider adding on a new antihypertensive medication. She will follow-up with her primary care physician also.  5. 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 Will continue monitoring renal function  -I previously encourage her to drink more fluids -We'll avoid NSAIDs and IV contrast for CT scan in the  future. -Avastin has been discontinued due to her persistent proteinuria and worsening renal function.  6. Goal of care discussion  -We again discussed the incurable nature of her cancer, and the  overall poor prognosis, especially if she does not have good response to chemotherapy or progress on chemo -The patient understands the goal of care is palliative. -I recommended DNR/DNI, she will think about it.   Plan -Lab reviewed and PET reviewed. - We will switch therapy to [place]. Due to her CKD, I will reduce her dose by 33% (75m/16.38mg bid on day 1-5 and day 8-12 every 28 days) -[place].  -I will see her again in 2 weeks.   All questions were answered. The patient knows to call the clinic with any problems, questions or concerns.  I spent 25 minutes counseling the patient face to face. The total time spent in the appointment was 30 minutes and more than 50% was on counseling. nts to them. This document has been checked and approved by the attending provider.   This document serves as a record of services personally performed by YTruitt Merle MD. It was created on her behalf by EArlyce Harman a trained medical scribe. The creation of this record is based on the scribe's personal observations and the provider's statements to them. This document has been checked and approved by the attending provider.   FTruitt Merle MD 09/10/2016

## 2016-09-08 ENCOUNTER — Telehealth: Payer: Self-pay

## 2016-09-08 NOTE — Telephone Encounter (Signed)
Pt called to confirm appts and directions for PET.

## 2016-09-09 ENCOUNTER — Encounter (HOSPITAL_COMMUNITY)
Admission: RE | Admit: 2016-09-09 | Discharge: 2016-09-09 | Disposition: A | Discharge status: Home / Self Care | Payer: BLUE CROSS/BLUE SHIELD | Source: Ambulatory Visit | Attending: Hematology | Admitting: Hematology

## 2016-09-09 DIAGNOSIS — C787 Secondary malignant neoplasm of liver and intrahepatic bile duct: Secondary | ICD-10-CM | POA: Diagnosis present

## 2016-09-09 DIAGNOSIS — C189 Malignant neoplasm of colon, unspecified: Secondary | ICD-10-CM | POA: Diagnosis not present

## 2016-09-09 LAB — GLUCOSE, CAPILLARY: GLUCOSE-CAPILLARY: 130 mg/dL — AB (ref 65–99)

## 2016-09-09 MED ORDER — FLUDEOXYGLUCOSE F - 18 (FDG) INJECTION
7.4000 | Freq: Once | INTRAVENOUS | Status: AC | PRN
  Administered 2016-09-09: 7.4 via INTRAVENOUS

## 2016-09-10 ENCOUNTER — Ambulatory Visit (HOSPITAL_BASED_OUTPATIENT_CLINIC_OR_DEPARTMENT_OTHER): Payer: BLUE CROSS/BLUE SHIELD

## 2016-09-10 ENCOUNTER — Ambulatory Visit (HOSPITAL_BASED_OUTPATIENT_CLINIC_OR_DEPARTMENT_OTHER): Payer: BLUE CROSS/BLUE SHIELD | Admitting: Hematology

## 2016-09-10 ENCOUNTER — Encounter: Payer: Self-pay | Admitting: Hematology

## 2016-09-10 ENCOUNTER — Other Ambulatory Visit (HOSPITAL_BASED_OUTPATIENT_CLINIC_OR_DEPARTMENT_OTHER): Payer: BLUE CROSS/BLUE SHIELD

## 2016-09-10 ENCOUNTER — Telehealth: Payer: Self-pay | Admitting: Hematology

## 2016-09-10 VITALS — BP 157/64 | HR 81 | Temp 98.3°F | Resp 18 | Ht 64.0 in | Wt 142.1 lb

## 2016-09-10 DIAGNOSIS — C787 Secondary malignant neoplasm of liver and intrahepatic bile duct: Secondary | ICD-10-CM | POA: Diagnosis not present

## 2016-09-10 DIAGNOSIS — D630 Anemia in neoplastic disease: Secondary | ICD-10-CM | POA: Diagnosis not present

## 2016-09-10 DIAGNOSIS — Z95828 Presence of other vascular implants and grafts: Secondary | ICD-10-CM

## 2016-09-10 DIAGNOSIS — N189 Chronic kidney disease, unspecified: Secondary | ICD-10-CM

## 2016-09-10 DIAGNOSIS — C189 Malignant neoplasm of colon, unspecified: Secondary | ICD-10-CM

## 2016-09-10 DIAGNOSIS — C187 Malignant neoplasm of sigmoid colon: Secondary | ICD-10-CM

## 2016-09-10 DIAGNOSIS — R109 Unspecified abdominal pain: Secondary | ICD-10-CM | POA: Diagnosis not present

## 2016-09-10 DIAGNOSIS — E119 Type 2 diabetes mellitus without complications: Secondary | ICD-10-CM

## 2016-09-10 DIAGNOSIS — C7800 Secondary malignant neoplasm of unspecified lung: Secondary | ICD-10-CM | POA: Diagnosis not present

## 2016-09-10 DIAGNOSIS — Z452 Encounter for adjustment and management of vascular access device: Secondary | ICD-10-CM

## 2016-09-10 DIAGNOSIS — R197 Diarrhea, unspecified: Secondary | ICD-10-CM

## 2016-09-10 DIAGNOSIS — I10 Essential (primary) hypertension: Secondary | ICD-10-CM | POA: Diagnosis not present

## 2016-09-10 LAB — CBC WITH DIFFERENTIAL/PLATELET
BASO%: 0.4 % (ref 0.0–2.0)
BASOS ABS: 0 10*3/uL (ref 0.0–0.1)
EOS%: 3.1 % (ref 0.0–7.0)
Eosinophils Absolute: 0.3 10*3/uL (ref 0.0–0.5)
HEMATOCRIT: 28.7 % — AB (ref 34.8–46.6)
HGB: 9 g/dL — ABNORMAL LOW (ref 11.6–15.9)
LYMPH#: 1.5 10*3/uL (ref 0.9–3.3)
LYMPH%: 17.4 % (ref 14.0–49.7)
MCH: 28.7 pg (ref 25.1–34.0)
MCHC: 31.4 g/dL — AB (ref 31.5–36.0)
MCV: 91.4 fL (ref 79.5–101.0)
MONO#: 0.8 10*3/uL (ref 0.1–0.9)
MONO%: 9.4 % (ref 0.0–14.0)
NEUT#: 5.8 10*3/uL (ref 1.5–6.5)
NEUT%: 69.7 % (ref 38.4–76.8)
PLATELETS: 195 10*3/uL (ref 145–400)
RBC: 3.14 10*6/uL — AB (ref 3.70–5.45)
RDW: 16.4 % — ABNORMAL HIGH (ref 11.2–14.5)
WBC: 8.3 10*3/uL (ref 3.9–10.3)

## 2016-09-10 LAB — COMPREHENSIVE METABOLIC PANEL
ALT: 55 U/L (ref 0–55)
ANION GAP: 6 meq/L (ref 3–11)
AST: 68 U/L — ABNORMAL HIGH (ref 5–34)
Albumin: 2.9 g/dL — ABNORMAL LOW (ref 3.5–5.0)
Alkaline Phosphatase: 556 U/L — ABNORMAL HIGH (ref 40–150)
BUN: 26.8 mg/dL — ABNORMAL HIGH (ref 7.0–26.0)
CALCIUM: 9.7 mg/dL (ref 8.4–10.4)
CHLORIDE: 105 meq/L (ref 98–109)
CO2: 24 meq/L (ref 22–29)
Creatinine: 1.4 mg/dL — ABNORMAL HIGH (ref 0.6–1.1)
EGFR: 46 mL/min/{1.73_m2} — ABNORMAL LOW (ref >90)
Glucose: 104 mg/dl (ref 70–140)
POTASSIUM: 4.8 meq/L (ref 3.5–5.1)
Sodium: 136 mEq/L (ref 136–145)
Total Bilirubin: 0.73 mg/dL (ref 0.20–1.20)
Total Protein: 7.6 g/dL (ref 6.4–8.3)

## 2016-09-10 LAB — CEA (IN HOUSE-CHCC): CEA (CHCC-IN HOUSE): 704.73 ng/mL — AB (ref 0.00–5.00)

## 2016-09-10 MED ORDER — SODIUM CHLORIDE 0.9 % IJ SOLN
10.0000 mL | INTRAMUSCULAR | Status: DC | PRN
  Administered 2016-09-10: 10 mL via INTRAVENOUS
  Filled 2016-09-10: qty 10

## 2016-09-10 MED ORDER — TRIFLURIDINE-TIPIRACIL 20-8.19 MG PO TABS: 40.0000 mg | ORAL_TABLET | Freq: Two times a day (BID) | ORAL | 0 refills | Status: DC

## 2016-09-10 MED ORDER — HEPARIN SOD (PORK) LOCK FLUSH 100 UNIT/ML IV SOLN
500.0000 [IU] | Freq: Once | INTRAVENOUS | Status: AC | PRN
  Administered 2016-09-10: 500 [IU] via INTRAVENOUS
  Filled 2016-09-10: qty 5

## 2016-09-10 NOTE — Patient Instructions (Signed)

## 2016-09-10 NOTE — Telephone Encounter (Signed)
No additional appts added. Per 8/22 los. F/u open

## 2016-09-11 ENCOUNTER — Telehealth: Payer: Self-pay | Admitting: Pharmacy Technician

## 2016-09-11 NOTE — Telephone Encounter (Signed)
Oral Oncology Patient Advocate Encounter  Received notification from [facility] that prior authorization for Michelle Erickson is required.  PA submitted on CoverMyMeds Key [id number] Status is pending  Oral Oncology Clinic will continue to follow.  Fabio Asa. Melynda Keller, Leigh Patient [place] [phone] 09/11/2016 11:30 AM

## 2016-09-16 ENCOUNTER — Telehealth: Payer: Self-pay | Admitting: Pharmacist

## 2016-09-16 ENCOUNTER — Telehealth: Payer: Self-pay | Admitting: Student-PharmD

## 2016-09-16 DIAGNOSIS — C189 Malignant neoplasm of colon, unspecified: Secondary | ICD-10-CM

## 2016-09-16 DIAGNOSIS — C787 Secondary malignant neoplasm of liver and intrahepatic bile duct: Principal | ICD-10-CM

## 2016-09-16 MED ORDER — TRIFLURIDINE-TIPIRACIL 20-8.19 MG PO TABS: ORAL_TABLET | ORAL | 0 refills | Status: DC

## 2016-09-16 NOTE — Telephone Encounter (Addendum)
Oral Chemotherapy Pharmacist Encounter  I spoke with patient for overview of new oral chemotherapy medication: Lonsurf (Trifluridine and Tipiracil) for the treatment of metastatic colon cancer (third-line after progressing on FOLFOX and FOLFIRI), planned duration indefinitely or until disease progression or unacceptable toxicities.   Pt is doing well and eagerly awaiting medication. The prescription has been sent to insurance and is pending approval.   Counseled patient on administration, dosing, side effects, safe handling, and monitoring. Patient will take Lonsurf (Trifluridine and Tipiracil) two 20-8.19mg  tablets BID within 1 hour of completion of morning and evening meals (ie: 40-16.38mg  BID for a total daily dose of 80-32.76mg ), on days 1-5 and days 8-12 every 28 days.   Patient was educated that she could start her medication on a Monday, and then take it Monday-Friday, take Saturday and Sunday off, then take it Monday-Friday again with Saturday and Sunday off, and then the next 2 weeks off. She would then restart the next Monday to re-start the cycle.   Patient was slightly unsure of the dosing schedule, so re-emphasis on the dosing schedule would be helpful. Additionally, when the patient comes in for her CBC prior to treatment initiation, she would like a calendar to help her understand how to take her medications: [email or web address]. The calendar will be created for her and given to her at her CBC check. I informed the patient that we would call her to schedule her CBC prior to treatment initiation.   Side effects include but not limited to: Fatigue, nausea, decreased appetite, diarrhea, vomiting, abdominal pain, anemia, neutropenia, thrombocytopenia, weakness.   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 Pt reports she has tried this medication before and it helped. I also advised the patient that she could promethazine (phenergan) as prescribed for treatment of breakthrough CINV. She also has Imodium on hand in case of diarrhea. I advised her that if she has any issues when taking this medication to call us and let us know.    Reviewed with patient importance of keeping a medication schedule and plan for any missed doses.  Mrs. Owens Shark voiced understanding and appreciation.   All questions answered, including whether or not she had to have diet restrictions with this medication. I informed her that there were no diet restrictions.   Will follow up with patient regarding insurance and pharmacy.   Patient knows to call the office with questions or concerns. Oral Oncology Clinic will continue to follow.  Thank you,  Biagio Borg,  Student Pharmacist    Oral Chemotherapy Pharmacist Encounter  I have read and agree with the above note/plan. Prescription for Zofran has been sent to patient's local pharmacy for V/V ppx for Lonsurf.  Johny Drilling, PharmD, BCPS, BCOP 09/17/2016  4:42 PM Oral Oncology Clinic [phone]

## 2016-09-16 NOTE — Telephone Encounter (Signed)
Oral Oncology Pharmacist Encounter  Received new prescription for Lonsurf for the treatment of metastatic colon cancer that has progressed on FOLFOX, FILFIRI, and anti-VEGF therapy, planned duration until disease progression or unacceptable toxicity.  Labs from 09/10/16 assessed, OK for treatment, noted elevated SCr and est CrCl~40 ml/min. No renal dose adjustments offered by manufacturer, however it is noted that AUC is increased by 30-40% in mild-moderate renal dysfunction and there is a possible corresponding increase in toxicity in this patient population. Noted MD dose reduction of Lonsurf initially by 33%.   Current medication list in Epic reviewed, no significant DDIs with Lonsurf identified.  Prescription has been e-scribed to the [facility] for benefits analysis and approval. Prior authorization has been submitted, status is pending.  Oral Oncology Clinic will continue to follow.  Johny Drilling, PharmD, BCPS, BCOP 09/16/2016 3:22 PM Oral Oncology Clinic [phone]

## 2016-09-17 ENCOUNTER — Ambulatory Visit: Payer: BLUE CROSS/BLUE SHIELD

## 2016-09-17 ENCOUNTER — Other Ambulatory Visit: Payer: BLUE CROSS/BLUE SHIELD

## 2016-09-17 MED ORDER — ONDANSETRON HCL 8 MG PO TABS: 4.0000 mg | ORAL_TABLET | Freq: Two times a day (BID) | ORAL | 3 refills | Status: AC

## 2016-09-17 NOTE — Telephone Encounter (Signed)
Oral Oncology Patient Advocate Encounter  Received notification from [place] that prior authorization for Lonsurf 20mg  is required.  PA submitted on CoverMyMeds Key [id number] Status is pending  A previous request for the 15mg  tablets was submitted previously.  This is also still pending.   Oral Oncology Clinic will continue to follow.  Fabio Asa. Melynda Keller, [place] Patient [place] [phone] 09/17/2016 3:34 PM

## 2016-09-18 MED FILL — LONSURF 20 MG-8.19 MG TAB: 20-8.19 | 28 days supply | Qty: 40 | Fill #0

## 2016-09-18 NOTE — Telephone Encounter (Signed)
Oral Oncology Patient Advocate Encounter  Spoke with patient today and discussed copay and copay assistance.  Her copayment with her commercial insurance alone is $150.  A copay card brings her copay to $0 monthly.    The pharmacy will be shipping her medication to her home.  I have taken a Lonsurf treatment calendar to the pharmacy to be included in her package.    She plans to begin treatment on Monday 09/22/2016.   Fabio Asa. Melynda Keller, [place] Patient [place] [phone] 09/18/2016 3:27 PM

## 2016-09-18 NOTE — Telephone Encounter (Signed)
Oral Oncology Patient Advocate Encounter  Both prior Authorizations for Lonsurf have been approved.    15mg -6.14mg  PA# [id number]  20mg -8.19mg  PA# [id number]  Effective dates: 09/18/2016 through 09/18/2017  Test claim reveals that patient's copayment is $150.00. I have been able to secure the patient a copay card that will reduce her copay to no more than $30 monthly.    Fabio Asa. Melynda Keller, [place] Patient [place] [phone] 09/18/2016 12:46 PM

## 2016-09-18 NOTE — Telephone Encounter (Signed)
Oral Oncology Patient Advocate Encounter  Contacted [facility] [employer] to follow up on the status of the Lonsurf Prior Authorization requests.    I was advised that the status of both requests were still "pending" and that since the requests were submitted as "standard requests"  On the patient's commercial plan, it could take as long as 14 business days to receive a response.  I requested that both PAs be escalated to urgent requests.    I will continue to monitor and follow up.    Fabio Asa. Melynda Keller, [place] Patient [place] [phone] 09/18/2016 10:13 AM

## 2016-09-19 ENCOUNTER — Telehealth: Payer: Self-pay | Admitting: Hematology

## 2016-09-19 NOTE — Telephone Encounter (Signed)
Scheduled appt per 8/30 sch message - no answer and no voicemail to leave message - sent reminder letter in the mail with appt date and time.

## 2016-09-23 ENCOUNTER — Telehealth: Payer: Self-pay | Admitting: *Deleted

## 2016-09-23 NOTE — Telephone Encounter (Signed)
Pt called. Just received Lonsurf medication in mail.  Needed to inquire about dosage.  Explained to start today 1 hr after lunch, then repeat 1 hr after dinner for days 1-5.  Then rest for 2 days  And restart on days 8-12.  Repeat cycle every 28 days.  Verified with Dr Burr Medico.  LOS sent to schedulers for new appt in 2 wks.

## 2016-10-02 NOTE — Progress Notes (Signed)
[place]  Telephone:([phone]) [phone] Fax:([phone]  Clinic Follow Up Note   Patient Care Team: Dione Housekeeper, MD as PCP - General (Family Medicine) Tania Ade, RN as Registered Nurse (Oncology) 10/08/2016   CHIEF COMPLAINTS:  Follow-up of metastatic colon cancer    Oncology History   7Metastatic colon cancer to liver   Staging form: Colon and Rectum, AJCC 7th Edition     Clinical: Stage Unknown ([place], NX, M1) - Unsigned        Metastatic colon cancer to liver ([place])   07/14/2014 Imaging    CT abdomen and pelvis with contrast showed a 6 cm segment of sigmoid bowel wall thickening with adjacent 14 x 12 mm nodules, diffuse hepatic metastasis. No bowel obstruction.      07/14/2014 Initial Diagnosis    Metastatic colon cancer to liver      07/25/2014 Procedure    Colonoscopy showed a near circumferential medial mass in the sigmoid colon, partially obstructing consistent with carcinoma.      07/25/2014 Initial Biopsy    Sigmoid: Biopsy showed invasive adenocarcinoma arising in a background of high-grade dysplasia.      07/28/2014 Imaging    CT chest showed no evidence of metastasis      08/09/2014 - 01/16/2016 Chemotherapy    mFOLFOX6 every 2 weeks, dose reduction from cycle 7 due to cytopenia, Avastin added from cycle 3, stopped due to disease progression       08/11/2014 - 08/15/2014 Hospital Admission    Pt was admitted for fever and nausea, treated for UTI, liver biopsy cancelled due to the infection       10/29/2015 Imaging    CT chest, abdomen and pelvis with contrast 10/29/2015 IMPRESSION: 1. New/enlarging pulmonary nodules, most notably the 8 by 6 mm left lower lobe pulmonary nodule, favoring metastatic disease. 2. The liver lesions are subjectively grossly similar in size, difficult to measure due to the lack of IV contrast causing poor boundaries. 3. There is some faint heterogeneity of bony density  in the sternum, but this is not significantly changed from 07/28/2014 and is probably incidental given the lack of other evidence of osseous metastatic disease. 4. Lower lumbar spondylosis and degenerative disc disease causing impingement. 5. Coronary, aortic arch, and branch vessel atherosclerotic vascular disease. Aortoiliac atherosclerotic vascular disease. 6. Mildly dilated esophagus with air-fluid level, probably reflecting dysmotility. 7. Cholelithiasis. 8. Thick wall urinary bladder, cystitis not excluded.      01/28/2016 Progression    Restaging CT showed disease progression in lungs, and probably progression in liver mets also      02/14/2016 PET scan    IMPRESSION: 1. Examination is positive for hypermetabolic tumor within both lobes of liver. Index measurements provided above. 2. There are scattered pulmonary nodules within both lungs, most of these are too small to reliably characterize by PET-CT. In the left lower lobe there is a lung nodule which exhibits malignant range FDG uptake measuring 9 mm. 3. Aortic atherosclerosis and coronary artery calcifications.      02/20/2016 -  Chemotherapy    Second line chemo FOLFIRI and Avastin, every 2 weeks       03/28/2016 Imaging    CT Abdomen pelvis without contrast 03/28/2016 IMPRESSION: 1. Diffuse circumferential wall thickening throughout the colon, suggesting acute colitis. Intraluminal fluid density compatible with associated diarrheal illness. 2. Superimposed nodular area of bowel wall thickening within the sigmoid colon suspected to be related to patient's known primary adenocarcinoma. 3. Poor visualization of  known hepatic metastases. No other evidence for metastatic disease within the abdomen and pelvis. Previously identified pulmonary nodules within the left lower and right middle lobes have slightly increased in size, concerning for slightly progressive metastatic disease. 4. Cholelithiasis. 5. Diffuse  atherosclerosis with 3 vessel coronary artery calcifications.      05/26/2016 PET scan    PET 05/26/16: IMPRESSION: 1. Overall relatively stable pulmonary and liver metastases. No new sites of hypermetabolic metastatic disease. 2. Dominant hypermetabolic left lower lobe lung metastasis is mildly increased in size and stable in metabolism. Additional subcentimeter solid pulmonary nodules are below PET resolution and not convincingly changed in size. 3. Hypermetabolic liver metastases are stable in number with mixed mild metabolic changes as detailed. 4. Additional findings include aortic atherosclerosis, coronary atherosclerosis and cholelithiasis.      06/08/2016 -  Hospital Admission    Patient presents to the ER due to nausea and dehydration       08/11/2016 - 08/15/2016 Hospital Admission    Admit date: 08/11/16 Admission diagnosis: intractable nausea and vomiting secondary to chemotherapy and underlying cancer Additional comments: I will likely change her chemo regimen if she recovers well and wants to continue treatment      09/09/2016 PET scan    1. Significant extravasation of radiopharmaceutical into the right upper arm may limit the sensitivity of this examination. 2. Progressive bilateral pulmonary metastatic disease. The dominant lesion in the left lower lobe has increased in size, although demonstrates decreased metabolic activity compared with the prior study. 3. Intensity of activity within the multifocal hepatic metastatic disease has also improved. The individual lesions are ill-defined and difficult to measure on the noncontrast CT images. 4. Interval development of nodal metastases within the gastrohepatic ligament.       HISTORY OF PRESENTING ILLNESS:  Michelle Erickson 61 y.o. female is here because of abnormal CT findings which is highly suspecious for metastatic colon cancer.   She presented intermittent nausea, anorexia and abdominal pain since Nov 2015, she  has mild pain in the LUQ, crapy pain, positional (bending over),   it happened once every 2-3 weeks, and it has been more frequent in the past month. She has normal BM, but did notice the caliber of the stool is smaller than before. She denies any melena or hematochezia. She lost about 13 lbs in the past 6 months.   She presents to local emergency room in November 2015, and May 2016, was felt to be related to gastric virus. Due to the worsening symptoms lately, she went to [facility] ED on 07/13/2014, CT abdomen was obtained which showed multiple large liver metastasis and probable sigmoid colon mass. She was referred to [place] for further workup. She is scheduled to see GI Dr. Olevia Perches this afternoon.  CURRENT THERAPY: Third line therapy with Lonsurf   INTERIM HISTORY:  Brionne returns for follow-up with her husband. She began taking Lonsurf 09/23/16 due to delayed [employer] delivery. She took for 5 days with 2 off days then again for 5 more days. She is tolerating well except generalized itching, excluding face and feet. She denies rash or skin breakdown, no redness. She has tried body lotion which helps. Has not tried benadryl. She denies nausea or diarrhea. She takes 1/2 of nausea pill prior to breakfast and dinner for prophylaxis. She takes Lonsurf 1 hour after AM and PM meals. Bowel movements are normal.  She will be due to begin next cycle 10/20/16.   MEDICAL HISTORY Past Medical History:  Diagnosis Date  . Back pain   . Diabetes mellitus without complication ([place])   . Family history of colon cancer   . Hyperlipemia   . Hypertension   . Metastatic colon cancer to liver ([place]) 07/21/2014  . Snoring   . Wears glasses    contacts/glasses   SURGICAL HISTORY: Past Surgical History:  Procedure Laterality Date  . CATARACT EXTRACTION Bilateral   . MOUTH SURGERY    . PORTACATH PLACEMENT N/A 08/03/2014   Procedure: INSERTION PORT-A-CATH;  Surgeon: Stark Klein, MD;  Location: [facility];  Service: General;   Laterality: N/A;   SOCIAL HISTORY: Social History   Social History  . Marital status: Married    Spouse name: Charlotte Crumb  . Number of children: 2  . Years of education: N/A   Occupational History  . Not on file.   Social History Main Topics  . Smoking status: Former Smoker    Packs/day: 1.00    Years: 6.00    Types: Cigarettes    Quit date: 01/21/1976  . Smokeless tobacco: Never Used  . Alcohol use No  . Drug use: No  . Sexual activity: Yes    Birth control/ protection: Post-menopausal     Comment: # 2 pregnancies and #2 live births   Other Topics Concern  . Not on file   Social History Narrative   Married, husband [name]   Has #2 adult children   Previously worked at [employer] HISTORY: Family History  Problem Relation Age of Onset  . COPD Father   . Heart attack Father   . Arthritis Sister   . Diabetes Sister   . Colon cancer Sister        dx over 65  . Colon polyps Sister   . Seizures Brother   . Colon cancer Sister        dx in her early 33s, died in her early to mid 37s  . Diabetes Mother    ALLERGIES:  has No Known Allergies.  MEDICATIONS:  Current Outpatient Prescriptions  Medication Sig Dispense Refill  . amLODipine (NORVASC) 10 MG tablet Take 10 mg by mouth every morning.     Marland Kitchen atorvastatin (LIPITOR) 40 MG tablet Take 40 mg by mouth every evening.    . diphenoxylate-atropine (LOMOTIL) 2.5-0.025 MG tablet Take 1-8 tablets by mouth 4 (four) times daily as needed for diarrhea or loose stools. 60 tablet 1  . ferrous sulfate 325 (65 FE) MG tablet Take 325 mg by mouth daily with breakfast.    . Insulin Glargine (LANTUS SOLOSTAR) 100 UNIT/ML Solostar Pen Inject 10 Units into the skin daily at 10 pm.    . insulin lispro (HUMALOG) 100 UNIT/ML injection Inject 4-8 Units into the skin 2 (two) times daily as needed for high blood sugar. Sliding scale    . lidocaine-prilocaine (EMLA) cream Apply 1 application topically once.     . ondansetron  (ZOFRAN) 8 MG tablet Take 0.5-1 tablets (4-8 mg total) by mouth 2 (two) times daily. Take 1 hour prior to Lonsurf dose for nausea prophylaxis 30 tablet 3  . promethazine (PHENERGAN) 25 MG tablet Take 0.5-1 tablets (12.5-25 mg total) by mouth every 8 (eight) hours as needed for nausea or vomiting. 60 tablet 2  . traMADol (ULTRAM) 50 MG tablet Take 1 tablet (50 mg total) by mouth every 6 (six) hours as needed. (Patient taking differently: Take 50 mg by mouth every 6 (six) hours as needed for moderate pain. )  30 tablet 1  . trifluridine-tipiracil (LONSURF) 20-8.19 MG tablet Take 2 tablets (49m) by mouth BID, 1 hr after AM & PM meals on days 1-5, 8-12. Repeat every 28days 40 tablet 0   No current facility-administered medications for this visit.    Facility-Administered Medications Ordered in Other Visits  Medication Dose Route Frequency Provider Last Rate Last Dose  . sodium chloride 0.9 % injection 10 mL  10 mL Intravenous PRN FTruitt Merle MD   10 mL at 05/14/16 0753   REVIEW OF SYSTEMS:   Constitutional: Denies fevers, chills or abnormal night sweats. (+) fatigue (+) decreased appetite, improving Eyes: Denies blurriness of vision, double vision or watery eyes Ears, nose, mouth, throat, and face: Denies mucositis or sore throat Respiratory: Denies cough, dyspnea or wheezes Cardiovascular: Denies palpitation, chest discomfort Gastrointestinal:  Denies heartburn, nausea, vomiting, constipation, or diarrhea. (+) BM normal  Skin: Denies abnormal skin rashes. (+) generalized itching, except face and feet Lymphatics: Denies new lymphadenopathy or easy bruising Neurological:Denies numbness, tingling or new weaknesses Behavioral/Psych: Mood is stable, no new changes  All other systems were reviewed with the patient and are negative.  PHYSICAL EXAMINATION: ECOG PERFORMANCE STATUS: 1 BP (!) 135/59 (BP Location: Left Arm, Patient Position: Sitting) Comment: nurse aware  Pulse 78   Temp 97.9 F (36.6  C) (Oral)   Resp 18   Ht 5' 4" (1.626 m)   Wt 140 lb 1.6 oz (63.5 kg)   SpO2 100%   BMI 24.05 kg/m  GENERAL:alert, no distress and comfortable. SKIN: skin color, texture, turgor are normal, no rashes or significant lesions EYES: normal, conjunctiva are pink and non-injected, sclera clear OROPHARYNX:no exudate, no erythema and lips, buccal mucosa, and tongue normal  NECK: supple, thyroid normal size, non-tender, without nodularity LYMPH:  no palpable lymphadenopathy in the cervical, axillary or inguinal LUNGS: clear to auscultation and percussion with normal breathing effort HEART: regular rate & rhythm and no murmurs and no lower extremity edema ABDOMEN:abdomen soft, non-tender and normal bowel sounds Musculoskeletal:no cyanosis of digits and no clubbing  PSYCH: alert & oriented x 3 with fluent speech NEURO: no focal motor/sensory deficits  LABORATORY DATA:  I have reviewed the data as listed CBC Latest Ref Rng & Units 10/08/2016 09/10/2016 08/14/2016  WBC 3.9 - 10.3 10e3/uL 4.8 8.3 2.6(L)  Hemoglobin 11.6 - 15.9 g/dL 7.6(L) 9.0(L) 9.2(L)  Hematocrit 34.8 - 46.6 % 23.9(L) 28.7(L) 28.3(L)  Platelets 145 - 400 10e3/uL 161 195 139(L)   CMP Latest Ref Rng & Units 10/08/2016 09/10/2016 08/15/2016  Glucose 70 - 140 mg/dl 172(H) 104 123(H)  BUN 7.0 - 26.0 mg/dL 31.4(H) 26.8(H) 25(H)  Creatinine 0.6 - 1.1 mg/dL 1.5(H) 1.4(H) 1.91(H)  Sodium 136 - 145 mEq/L 136 136 138  Potassium 3.5 - 5.1 mEq/L 5.4 No visable hemolysis(H) 4.8 3.9  Chloride 101 - 111 mmol/L - - 113(H)  CO2 22 - 29 mEq/L 22 24 19(L)  Calcium 8.4 - 10.4 mg/dL 9.5 9.7 7.9(L)  Total Protein 6.4 - 8.3 g/dL 7.3 7.6 -  Total Bilirubin 0.20 - 1.20 mg/dL 1.41(H) 0.73 -  Alkaline Phos 40 - 150 U/L 848(H) 556(H) -  AST 5 - 34 U/L 91(H) 68(H) -  ALT 0 - 55 U/L 75(H) 55 -   CEA (0-5ng/ml) 07/21/2014: 594.5 11/23/2014: 18.2 03/07/2015: 6.6 09/19/2015: 37.9 10/29/2015: 63.7 11/28/2015: 28.99 01/02/2016: 40.81 01/28/2015: 47 03/05/16:  62.66 04/16/16: 92.49 05/14/16: 96.62 06/11/16: 145.13 07/09/2016: 180.53 08/06/16: 372.79 09/10/16: 704.73 10/08/16 1070.79  PATHOLOGY REPORTS: Diagnosis 07/25/2014  Surgical [P], sigmoid - INVASIVE ADENOCARCINOMA ARISING IN A BACKGROUND OF HIGH GRADE DYSPLASIA. - SEE COMMENT.  Diagnosis 09/05/2014 Liver, biopsy - METASTATIC ADENOCARCINOMA, SEE COMMENT.   RADIOGRAPHIC STUDIES: I have personally reviewed the radiological images as listed and agreed with the findings in the report.  PET 09/09/2016 IMPRESSION: 1. Significant extravasation of radiopharmaceutical into the right upper arm may limit the sensitivity of this examination. 2. Progressive bilateral pulmonary metastatic disease. The dominant lesion in the left lower lobe has increased in size, although demonstrates decreased metabolic activity compared with the prior study. 3. Intensity of activity within the multifocal hepatic metastatic disease has also improved. The individual lesions are ill-defined and difficult to measure on the noncontrast CT images. 4. Interval development of nodal metastases within the gastrohepatic ligament.  PET 05/26/16: IMPRESSION: 1. Overall relatively stable pulmonary and liver metastases. No new sites of hypermetabolic metastatic disease. 2. Dominant hypermetabolic left lower lobe lung metastasis is mildly increased in size and stable in metabolism. Additional subcentimeter solid pulmonary nodules are below PET resolution and not convincingly changed in size. 3. Hypermetabolic liver metastases are stable in number with mixed mild metabolic changes as detailed. 4. Additional findings include aortic atherosclerosis, coronary atherosclerosis and cholelithiasis.   PET 02/14/2016 IMPRESSION: 1. Examination is positive for hypermetabolic tumor within both lobes of liver. Index measurements provided above. 2. There are scattered pulmonary nodules within both lungs, most of these are too small  to reliably characterize by PET-CT. In the left lower lobe there is a lung nodule which exhibits malignant range FDG uptake measuring 9 mm. 3. Aortic atherosclerosis and coronary artery calcifications.  Colonoscopy 07/25/2014 Dr. Olevia Perches  ENDOSCOPIC IMPRESSION: Near circumferential medium mass was found in the sigmoid colon; multiple biopsies were performed using cold forceps, partially obstructing mass consistent with carcinoma. Placement of endoclips and tattoo at the margins of the mass which extends from 18-25 cm from the anus  ASSESSMENT & PLAN: 61 y.o. African-American female, presented with intermittent nausea vomiting and anemia.   1. 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 -The natural history of metastatic colon cancer and treatment options were discussed with patient and her husband. Giving the diffuse metastasis in the liver, this is unfortunately incurable disease. I recommend systemic chemotherapy to control her metastatic disease, the goal of therapy is palliative and to prolong her life. -I previously reviewed her restaging CT scan from 01/28/2016, which showed slight disease progression in lungs, especailly the LLL lung nodule. Due to lack of CT contrast, it is difficult to evaluate the metastasis in the liver, but it appears to be progression also. -I previously discussed her PET scan findings, which showed hypermetabolic multiple liver metastasis and a few lung metastasis.  -she is on second line chemo FOLFIRI and Avastin, tolerating well overall  -Due to her severe diarrhea from cycle 3 chemotherapy, I previously reduced Irinotecan dose slightly, she is tolerating much  better. -Pet scan from 05/26/16 reviewed with pt and her husband. It showed stable disease overall, mild slightly increased size of one lung met, no other new lesions, will continue chemo.  -Due to her persistent proteinuria and started worsening renal function, I will hold Avastin permanently from 07/24/2016 -We'll continue follow-up her CEA tumor  marker monthly. -she tolerated FOLFIRI poorly overall, with severe diarrhea which required hospital admission twice, despite dose reduction and aggressive antidiarrhea medications. So I recommend her to stop FOLFIRI.  - I discussed her 09/09/2016 PET findings, which showed progressive bilateral pulmonary metastatic disease. The dominant lesion in the left lower lobe has increased in size, although demonstrates decreased metabolic activity compared with the prior study. Intensity of activity within the multifocal hepatic metastatic disease has also improved, however she had suboptimals dose contrast due to contrast leakage. The individual lesions are ill-defined and difficult to measure on the non-contrast CT images. I think the scan overall showed stable disease/slight worse of lung metastasis.  -She has clinically recovered well from her recent hospitalization. -She began palliative third line Lonsurf on 09/23/16,  -labs reviewed, she is anemic with Hgb 7.6; however she feels okay. We will delay blood transfusion and recheck labs next week before beginning next cycle on 10/20/16.  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 -Her ferritin  was previously normal, serum iron and saturation slightly low, TIBC low normal, I suggest her to take oral iron supplement, she tolerates well, we'll continue once daily, with orange juice or vitamin C. -Iron 03/05/2016 was 64 Her HGB is 7.6 today 10/08/16; she feels well so we will delay transfusion for now - will continue monitoring   3. Abdominal pain and diarrhea -Likely secondary to chemotherapy, her underlying colon cancer may also contributes. -this has resolved since stopped iv chemo two month ago  -She will use tramadol as needed for pain   4. DM, HTN -Continue follow-up with PCP - we previously discussed the potential impact of chemotherapy and premedication dexamethasone on her blood pressure and sugar,will  Monitor closely. Her BG has been well controlled lately  -I previously encouraged her to measure her blood pressure at home. If SBP persistently high than 150, I'll consider adding on a new antihypertensive medication. She will follow-up with her primary care physician also. -She is off avastin now, BP 135/59 today  5. 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 Will continue monitoring renal function  -I previously encourage her to drink more fluids -We'll avoid NSAIDs and IV contrast for CT scan in the future. -Avastin has been discontinued due to her persistent proteinuria and worsening renal function.  6. Goal of care discussion  -We again discussed the incurable nature of her cancer, and the overall poor prognosis, especially if she does not have good response to chemotherapy or progress on chemo -The patient understands the goal of care is palliative. -I recommended DNR/DNI, she will think about it.  7. Hyperkalemia -K 5.4 today, reviewed with patient; encouraged to avoid bananas and potassium-rich foods for now -repeat lab next week   Plan -Lab reviewed, she is anemic, mildly elevated LFT and bilirubin; will monitor  -repeat labs end of  next week with possible blood transfusion 10/18/16, and lab again on 10/15 -she will start cycle 2 Lonsurf on 10/1 if lab adequate next week  -lab, flush,  f/u in 5 weeks before cycle 3   All questions were answered. The patient knows to call the clinic with any problems, questions or concerns.  I spent 20 minutes counseling the patient face to face. The total time spent in the appointment was 25 minutes and more than 50% was on counseling.     Truitt Merle, MD 10/08/2016

## 2016-10-08 ENCOUNTER — Ambulatory Visit (HOSPITAL_BASED_OUTPATIENT_CLINIC_OR_DEPARTMENT_OTHER): Payer: BLUE CROSS/BLUE SHIELD | Admitting: Hematology

## 2016-10-08 ENCOUNTER — Telehealth: Payer: Self-pay | Admitting: Hematology

## 2016-10-08 ENCOUNTER — Other Ambulatory Visit (HOSPITAL_BASED_OUTPATIENT_CLINIC_OR_DEPARTMENT_OTHER): Payer: BLUE CROSS/BLUE SHIELD

## 2016-10-08 ENCOUNTER — Ambulatory Visit (HOSPITAL_BASED_OUTPATIENT_CLINIC_OR_DEPARTMENT_OTHER): Payer: BLUE CROSS/BLUE SHIELD

## 2016-10-08 ENCOUNTER — Encounter: Payer: Self-pay | Admitting: Hematology

## 2016-10-08 VITALS — BP 135/59 | HR 78 | Temp 97.9°F | Resp 18 | Ht 64.0 in | Wt 140.1 lb

## 2016-10-08 DIAGNOSIS — C187 Malignant neoplasm of sigmoid colon: Secondary | ICD-10-CM

## 2016-10-08 DIAGNOSIS — Z452 Encounter for adjustment and management of vascular access device: Secondary | ICD-10-CM

## 2016-10-08 DIAGNOSIS — D630 Anemia in neoplastic disease: Secondary | ICD-10-CM

## 2016-10-08 DIAGNOSIS — C189 Malignant neoplasm of colon, unspecified: Secondary | ICD-10-CM

## 2016-10-08 DIAGNOSIS — C787 Secondary malignant neoplasm of liver and intrahepatic bile duct: Secondary | ICD-10-CM

## 2016-10-08 DIAGNOSIS — I10 Essential (primary) hypertension: Secondary | ICD-10-CM | POA: Diagnosis not present

## 2016-10-08 DIAGNOSIS — C7800 Secondary malignant neoplasm of unspecified lung: Secondary | ICD-10-CM | POA: Diagnosis not present

## 2016-10-08 DIAGNOSIS — N189 Chronic kidney disease, unspecified: Secondary | ICD-10-CM | POA: Diagnosis not present

## 2016-10-08 DIAGNOSIS — Z95828 Presence of other vascular implants and grafts: Secondary | ICD-10-CM

## 2016-10-08 DIAGNOSIS — E119 Type 2 diabetes mellitus without complications: Secondary | ICD-10-CM

## 2016-10-08 LAB — COMPREHENSIVE METABOLIC PANEL
ALT: 75 U/L — AB (ref 0–55)
AST: 91 U/L — ABNORMAL HIGH (ref 5–34)
Albumin: 2.8 g/dL — ABNORMAL LOW (ref 3.5–5.0)
Alkaline Phosphatase: 848 U/L — ABNORMAL HIGH (ref 40–150)
Anion Gap: 7 mEq/L (ref 3–11)
BILIRUBIN TOTAL: 1.41 mg/dL — AB (ref 0.20–1.20)
BUN: 31.4 mg/dL — ABNORMAL HIGH (ref 7.0–26.0)
CHLORIDE: 108 meq/L (ref 98–109)
CO2: 22 meq/L (ref 22–29)
Calcium: 9.5 mg/dL (ref 8.4–10.4)
Creatinine: 1.5 mg/dL — ABNORMAL HIGH (ref 0.6–1.1)
EGFR: 42 mL/min/{1.73_m2} — AB (ref >90)
Glucose: 172 mg/dl — ABNORMAL HIGH (ref 70–140)
Potassium: 5.4 mEq/L — ABNORMAL HIGH (ref 3.5–5.1)
SODIUM: 136 meq/L (ref 136–145)
TOTAL PROTEIN: 7.3 g/dL (ref 6.4–8.3)

## 2016-10-08 LAB — CBC WITH DIFFERENTIAL/PLATELET
BASO%: 0.2 % (ref 0.0–2.0)
Basophils Absolute: 0 10*3/uL (ref 0.0–0.1)
EOS%: 1.7 % (ref 0.0–7.0)
Eosinophils Absolute: 0.1 10*3/uL (ref 0.0–0.5)
HCT: 23.9 % — ABNORMAL LOW (ref 34.8–46.6)
HGB: 7.6 g/dL — ABNORMAL LOW (ref 11.6–15.9)
LYMPH%: 19.4 % (ref 14.0–49.7)
MCH: 28.7 pg (ref 25.1–34.0)
MCHC: 31.8 g/dL (ref 31.5–36.0)
MCV: 90.2 fL (ref 79.5–101.0)
MONO#: 0.2 10*3/uL (ref 0.1–0.9)
MONO%: 4.1 % (ref 0.0–14.0)
NEUT%: 74.6 % (ref 38.4–76.8)
NEUTROS ABS: 3.6 10*3/uL (ref 1.5–6.5)
Platelets: 161 10*3/uL (ref 145–400)
RBC: 2.65 10*6/uL — AB (ref 3.70–5.45)
RDW: 15.9 % — ABNORMAL HIGH (ref 11.2–14.5)
WBC: 4.8 10*3/uL (ref 3.9–10.3)
lymph#: 0.9 10*3/uL (ref 0.9–3.3)

## 2016-10-08 LAB — CEA (IN HOUSE-CHCC): CEA (CHCC-In House): 1070.79 ng/mL — ABNORMAL HIGH (ref 0.00–5.00)

## 2016-10-08 LAB — UA PROTEIN, DIPSTICK - CHCC: Protein, ur: 300 mg/dL

## 2016-10-08 MED ORDER — SODIUM CHLORIDE 0.9 % IJ SOLN
10.0000 mL | INTRAMUSCULAR | Status: DC | PRN
  Administered 2016-10-08: 10 mL via INTRAVENOUS
  Filled 2016-10-08: qty 10

## 2016-10-08 MED ORDER — HEPARIN SOD (PORK) LOCK FLUSH 100 UNIT/ML IV SOLN
500.0000 [IU] | Freq: Once | INTRAVENOUS | Status: AC | PRN
  Administered 2016-10-08: 500 [IU]
  Filled 2016-10-08: qty 5

## 2016-10-08 NOTE — Telephone Encounter (Signed)
Gave avs and calendar for September and October  °

## 2016-10-14 ENCOUNTER — Other Ambulatory Visit: Payer: Self-pay | Admitting: Hematology

## 2016-10-14 DIAGNOSIS — C787 Secondary malignant neoplasm of liver and intrahepatic bile duct: Principal | ICD-10-CM

## 2016-10-14 DIAGNOSIS — C189 Malignant neoplasm of colon, unspecified: Secondary | ICD-10-CM

## 2016-10-16 ENCOUNTER — Other Ambulatory Visit: Payer: Self-pay | Admitting: Pharmacist

## 2016-10-16 ENCOUNTER — Telehealth: Payer: Self-pay | Admitting: Hematology

## 2016-10-16 DIAGNOSIS — C189 Malignant neoplasm of colon, unspecified: Secondary | ICD-10-CM

## 2016-10-16 DIAGNOSIS — C787 Secondary malignant neoplasm of liver and intrahepatic bile duct: Principal | ICD-10-CM

## 2016-10-16 MED ORDER — TRIFLURIDINE-TIPIRACIL 20-8.19 MG PO TABS: ORAL_TABLET | ORAL | 0 refills | Status: AC

## 2016-10-16 NOTE — Telephone Encounter (Signed)
Called patient regarding 9/28 change to 8:30

## 2016-10-16 NOTE — Progress Notes (Signed)
Oral Chemotherapy Pharmacist Encounter  Received notification from [facility] that Michelle Erickson will now have to be filled by [facility] due to an insurance requirement. Prescription was routed to [facility] [phone]).   Patient advocate Gilmore [facility] will fax over supportive documents for the prescription and the [facility] call center is informing the patient of the pharmacy change.   Thank you,  Darl Pikes, PharmD, BCPS Hematology/Oncology Clinical Pharmacist [facility] [phone]  10/16/2016 9:01 AM

## 2016-10-17 ENCOUNTER — Other Ambulatory Visit: Payer: Self-pay | Admitting: *Deleted

## 2016-10-17 ENCOUNTER — Telehealth: Payer: Self-pay | Admitting: *Deleted

## 2016-10-17 ENCOUNTER — Ambulatory Visit (HOSPITAL_BASED_OUTPATIENT_CLINIC_OR_DEPARTMENT_OTHER): Payer: BLUE CROSS/BLUE SHIELD

## 2016-10-17 ENCOUNTER — Other Ambulatory Visit (HOSPITAL_BASED_OUTPATIENT_CLINIC_OR_DEPARTMENT_OTHER): Payer: BLUE CROSS/BLUE SHIELD

## 2016-10-17 DIAGNOSIS — Z452 Encounter for adjustment and management of vascular access device: Secondary | ICD-10-CM

## 2016-10-17 DIAGNOSIS — Z95828 Presence of other vascular implants and grafts: Secondary | ICD-10-CM

## 2016-10-17 DIAGNOSIS — C187 Malignant neoplasm of sigmoid colon: Secondary | ICD-10-CM

## 2016-10-17 DIAGNOSIS — C787 Secondary malignant neoplasm of liver and intrahepatic bile duct: Principal | ICD-10-CM

## 2016-10-17 DIAGNOSIS — C189 Malignant neoplasm of colon, unspecified: Secondary | ICD-10-CM

## 2016-10-17 LAB — CBC WITH DIFFERENTIAL/PLATELET
BASO%: 0.6 % (ref 0.0–2.0)
BASOS ABS: 0 10*3/uL (ref 0.0–0.1)
EOS ABS: 0 10*3/uL (ref 0.0–0.5)
EOS%: 1.4 % (ref 0.0–7.0)
HCT: 24.9 % — ABNORMAL LOW (ref 34.8–46.6)
HEMOGLOBIN: 8.2 g/dL — AB (ref 11.6–15.9)
LYMPH#: 0.6 10*3/uL — AB (ref 0.9–3.3)
LYMPH%: 24.2 % (ref 14.0–49.7)
MCH: 30.2 pg (ref 25.1–34.0)
MCHC: 32.8 g/dL (ref 31.5–36.0)
MCV: 92 fL (ref 79.5–101.0)
MONO#: 0.5 10*3/uL (ref 0.1–0.9)
MONO%: 21.7 % — AB (ref 0.0–14.0)
NEUT%: 52.1 % (ref 38.4–76.8)
NEUTROS ABS: 1.3 10*3/uL — AB (ref 1.5–6.5)
Platelets: 180 10*3/uL (ref 145–400)
RBC: 2.71 10*6/uL — ABNORMAL LOW (ref 3.70–5.45)
RDW: 17.8 % — AB (ref 11.2–14.5)
WBC: 2.4 10*3/uL — AB (ref 3.9–10.3)

## 2016-10-17 LAB — COMPREHENSIVE METABOLIC PANEL
ALBUMIN: 3 g/dL — AB (ref 3.5–5.0)
ALK PHOS: 858 U/L — AB (ref 40–150)
ALT: 66 U/L — ABNORMAL HIGH (ref 0–55)
AST: 87 U/L — AB (ref 5–34)
Anion Gap: 7 mEq/L (ref 3–11)
BILIRUBIN TOTAL: 1.45 mg/dL — AB (ref 0.20–1.20)
BUN: 28.9 mg/dL — ABNORMAL HIGH (ref 7.0–26.0)
CALCIUM: 9.7 mg/dL (ref 8.4–10.4)
CO2: 22 mEq/L (ref 22–29)
CREATININE: 1.5 mg/dL — AB (ref 0.6–1.1)
Chloride: 110 mEq/L — ABNORMAL HIGH (ref 98–109)
EGFR: 41 mL/min/{1.73_m2} — ABNORMAL LOW (ref >90)
GLUCOSE: 91 mg/dL (ref 70–140)
Potassium: 5 mEq/L (ref 3.5–5.1)
SODIUM: 139 meq/L (ref 136–145)
TOTAL PROTEIN: 7.8 g/dL (ref 6.4–8.3)

## 2016-10-17 MED ORDER — HEPARIN SOD (PORK) LOCK FLUSH 100 UNIT/ML IV SOLN
500.0000 [IU] | Freq: Once | INTRAVENOUS | Status: AC | PRN
  Administered 2016-10-17: 500 [IU] via INTRAVENOUS
  Filled 2016-10-17: qty 5

## 2016-10-17 MED ORDER — SODIUM CHLORIDE 0.9 % IJ SOLN
10.0000 mL | INTRAMUSCULAR | Status: DC | PRN
  Administered 2016-10-17: 10 mL via INTRAVENOUS
  Filled 2016-10-17: qty 10

## 2016-10-17 NOTE — Telephone Encounter (Signed)
Pt informed of todays lab results.  HGB slightly low at 8.2.  No blood transfusion needed per Dr Burr Medico.  Platelets 180 - normal.  Pt  receptive to results.

## 2016-10-17 NOTE — Telephone Encounter (Signed)
Spoke with pt and was informed that she will need Lonsurf meds to start again on  10/20/16.   Noted that Nuala Alpha, RPh had refilled med yesterday to [facility].   Spoke with [facility] pharmacist, and was informed that Frankey Poot is dispensed by [facility] - a new division of [facility]. Spoke with [facility] pharmacist, and was informed that pt will be contacted today to set up delivery of med.

## 2016-10-21 ENCOUNTER — Telehealth: Payer: Self-pay | Admitting: *Deleted

## 2016-10-21 NOTE — Telephone Encounter (Signed)
Received call from pt who is upset b/c she has not received her lonesurf script.  She states she has called [facility] & was told that they were waiting on [place] for clarification.  Called & spoke with [facility] & was transferred to [facility] spoke with pharmacist who says that they were waiting for return call from [place] to clarify dose of lonesurf.  Pharmacist states that BSA is 1.7 & dose should be higher based on this.  Reviewed OV note & clarified correct dose as written reduced due to CKD.  Informed pharmacist & was informed that RX would be processed & overnighted once pt contacted for delivery.  Informed pt of process & answered questions about how to take.  She was to start 10/1 & was upset that she was unable to do so.  Encouraged to start cycle just like last time & day 1 starts when she gets med & starts.  She expressed understanding.

## 2016-10-23 ENCOUNTER — Telehealth: Payer: Self-pay | Admitting: *Deleted

## 2016-10-23 NOTE — Telephone Encounter (Signed)
Pt called requesting a call back from nurse.   Spoke with pt and was informed that pt sees her PCP 10/24/16, and wanted to know if pt could get flu shot.  Dr. Burr Medico notified.  Spoke with pt and informed her it is ok for pt to receive flu shot at PCP office.   Pt also stated she has not received Lonsurf yet.   Pt wanted to know when to take Lonsurf if pt receives meds this evening.  Instructed pt if she receives meds this afternoon, pt can start evening dose as prescribed per Dr. Burr Medico.  Reinforced with pt to NEVER double dose chemo meds.   Pt understood to take Lonsurf for 5 days on,  Off  2  Days,  And  Restart for another 5 days. Pt understood in the event Lonsurf arrive too late in the evening, she can start med in the am as prescribed. Pt's    Phone     [phone].

## 2016-10-27 ENCOUNTER — Telehealth: Payer: Self-pay | Admitting: *Deleted

## 2016-10-27 NOTE — Telephone Encounter (Signed)
Pt called stating that she still has not received Lonsurf yet.  This nurse spoke with Daneil Dan, pharmacist @ [facility] , and was informed that their technician had tried to call pt several times and left message on voice mail to set up delivery of meds, but no response from pt yet.  Verified all phone numbers listed with our EPIC. Spoke with pt and informed her of same info above.  Gave pt phone number to [place] to call as well. [place]       [phone].

## 2016-10-29 ENCOUNTER — Telehealth: Payer: Self-pay | Admitting: *Deleted

## 2016-10-29 NOTE — Telephone Encounter (Signed)
Pt called to inform nurse that pt has received Lonsurf meds this afternoon.  Wanted to know when to start med.  Dr. Burr Medico notified.  Spoke with pt and instructed pt to start [place] on Thursday  10/30/16 for 5 days ;  Off  2 days;  Restart Lonsurf for another  5 days as per md's instructions. Informed pt that she will be contacted with next lab and office visit appts.  Pt voiced understanding. Pt's    Phone     [phone].

## 2016-11-03 ENCOUNTER — Other Ambulatory Visit: Payer: BLUE CROSS/BLUE SHIELD

## 2016-11-14 ENCOUNTER — Other Ambulatory Visit: Payer: BLUE CROSS/BLUE SHIELD

## 2016-11-14 ENCOUNTER — Ambulatory Visit: Payer: BLUE CROSS/BLUE SHIELD | Admitting: Hematology

## 2016-11-14 ENCOUNTER — Ambulatory Visit (HOSPITAL_BASED_OUTPATIENT_CLINIC_OR_DEPARTMENT_OTHER): Payer: BLUE CROSS/BLUE SHIELD

## 2016-11-14 ENCOUNTER — Ambulatory Visit (HOSPITAL_BASED_OUTPATIENT_CLINIC_OR_DEPARTMENT_OTHER): Payer: BLUE CROSS/BLUE SHIELD | Admitting: Medical

## 2016-11-14 ENCOUNTER — Encounter: Payer: Self-pay | Admitting: Medical

## 2016-11-14 ENCOUNTER — Ambulatory Visit (HOSPITAL_COMMUNITY)
Admission: RE | Admit: 2016-11-14 | Discharge: 2016-11-14 | Disposition: A | Discharge status: Home / Self Care | Payer: BLUE CROSS/BLUE SHIELD | Source: Ambulatory Visit | Attending: Hematology | Admitting: Hematology

## 2016-11-14 DIAGNOSIS — D6481 Anemia due to antineoplastic chemotherapy: Secondary | ICD-10-CM | POA: Insufficient documentation

## 2016-11-14 DIAGNOSIS — Z452 Encounter for adjustment and management of vascular access device: Secondary | ICD-10-CM | POA: Diagnosis not present

## 2016-11-14 DIAGNOSIS — T451X5A Adverse effect of antineoplastic and immunosuppressive drugs, initial encounter: Secondary | ICD-10-CM

## 2016-11-14 DIAGNOSIS — C787 Secondary malignant neoplasm of liver and intrahepatic bile duct: Principal | ICD-10-CM

## 2016-11-14 DIAGNOSIS — Z95828 Presence of other vascular implants and grafts: Secondary | ICD-10-CM

## 2016-11-14 DIAGNOSIS — C189 Malignant neoplasm of colon, unspecified: Secondary | ICD-10-CM

## 2016-11-14 DIAGNOSIS — C7800 Secondary malignant neoplasm of unspecified lung: Secondary | ICD-10-CM

## 2016-11-14 DIAGNOSIS — X58XXXA Exposure to other specified factors, initial encounter: Secondary | ICD-10-CM | POA: Insufficient documentation

## 2016-11-14 DIAGNOSIS — L299 Pruritus, unspecified: Secondary | ICD-10-CM | POA: Diagnosis not present

## 2016-11-14 DIAGNOSIS — D630 Anemia in neoplastic disease: Secondary | ICD-10-CM

## 2016-11-14 LAB — CBC WITH DIFFERENTIAL/PLATELET
BASO%: 0.2 % (ref 0.0–2.0)
BASOS ABS: 0 10*3/uL (ref 0.0–0.1)
EOS%: 1.2 % (ref 0.0–7.0)
Eosinophils Absolute: 0.1 10*3/uL (ref 0.0–0.5)
HCT: 21.9 % — ABNORMAL LOW (ref 34.8–46.6)
HEMOGLOBIN: 7.2 g/dL — AB (ref 11.6–15.9)
LYMPH#: 1 10*3/uL (ref 0.9–3.3)
LYMPH%: 10.9 % — ABNORMAL LOW (ref 14.0–49.7)
MCH: 29 pg (ref 25.1–34.0)
MCHC: 32.9 g/dL (ref 31.5–36.0)
MCV: 88.3 fL (ref 79.5–101.0)
MONO#: 0.5 10*3/uL (ref 0.1–0.9)
MONO%: 5.3 % (ref 0.0–14.0)
NEUT#: 7.2 10*3/uL — ABNORMAL HIGH (ref 1.5–6.5)
NEUT%: 82.4 % — ABNORMAL HIGH (ref 38.4–76.8)
NRBC: 0 % (ref 0–0)
Platelets: 234 10*3/uL (ref 145–400)
RBC: 2.48 10*6/uL — ABNORMAL LOW (ref 3.70–5.45)
RDW: 17.8 % — AB (ref 11.2–14.5)
WBC: 8.7 10*3/uL (ref 3.9–10.3)

## 2016-11-14 LAB — COMPREHENSIVE METABOLIC PANEL
ALBUMIN: 2.3 g/dL — AB (ref 3.5–5.0)
ALK PHOS: 732 U/L — AB (ref 40–150)
ALT: 72 U/L — AB (ref 0–55)
AST: 118 U/L — AB (ref 5–34)
Anion Gap: 8 mEq/L (ref 3–11)
BILIRUBIN TOTAL: 8.48 mg/dL — AB (ref 0.20–1.20)
BUN: 31.1 mg/dL — AB (ref 7.0–26.0)
CO2: 20 mEq/L — ABNORMAL LOW (ref 22–29)
Calcium: 9.4 mg/dL (ref 8.4–10.4)
Chloride: 104 mEq/L (ref 98–109)
Creatinine: 1.8 mg/dL — ABNORMAL HIGH (ref 0.6–1.1)
EGFR: 35 mL/min/{1.73_m2} — ABNORMAL LOW (ref >60)
GLUCOSE: 120 mg/dL (ref 70–140)
Potassium: 5.3 mEq/L — ABNORMAL HIGH (ref 3.5–5.1)
SODIUM: 132 meq/L — AB (ref 136–145)
TOTAL PROTEIN: 7.2 g/dL (ref 6.4–8.3)

## 2016-11-14 LAB — PREPARE RBC (CROSSMATCH)

## 2016-11-14 LAB — TECHNOLOGIST REVIEW

## 2016-11-14 MED ORDER — SODIUM CHLORIDE 0.9 % IJ SOLN
10.0000 mL | INTRAMUSCULAR | Status: DC | PRN
  Administered 2016-11-14: 10 mL via INTRAVENOUS
  Filled 2016-11-14: qty 10

## 2016-11-14 MED ORDER — HEPARIN SOD (PORK) LOCK FLUSH 100 UNIT/ML IV SOLN
250.0000 [IU] | INTRAVENOUS | Status: DC | PRN
  Filled 2016-11-14: qty 5

## 2016-11-14 MED ORDER — DIPHENHYDRAMINE HCL 25 MG PO CAPS
ORAL_CAPSULE | ORAL | Status: AC
  Filled 2016-11-14: qty 1

## 2016-11-14 MED ORDER — DIPHENHYDRAMINE HCL 25 MG PO TABS
25.0000 mg | ORAL_TABLET | Freq: Once | ORAL | Status: AC
  Administered 2016-11-14: 25 mg via ORAL
  Filled 2016-11-14: qty 1

## 2016-11-14 MED ORDER — DIPHENHYDRAMINE HCL 25 MG PO CAPS: 25.0000 mg | ORAL_CAPSULE | Freq: Once | ORAL | Status: DC

## 2016-11-14 MED ORDER — SODIUM CHLORIDE 0.9% FLUSH
3.0000 mL | INTRAVENOUS | Status: DC | PRN
  Filled 2016-11-14: qty 10

## 2016-11-14 MED ORDER — HEPARIN SOD (PORK) LOCK FLUSH 100 UNIT/ML IV SOLN
500.0000 [IU] | Freq: Once | INTRAVENOUS | Status: AC | PRN
  Administered 2016-11-14: 500 [IU] via INTRAVENOUS
  Filled 2016-11-14: qty 5

## 2016-11-14 NOTE — Progress Notes (Signed)
Pt to go to ED for further work up d/t jaundice, elevated bilirubin and anemia-hgb7.2 Report called to charge nurse, susan. Pt changed her mind and declined to go to ED. She wanted to go home and follow up with Dr. Burr Medico on Monday, 11/17/16.  ED charge nurse made aware.  Pt scheduled for Transfusion of 2 units of blood on 11/17/16 after getting [place] of abdomen in Radiology @ 9:15 am  Dr. Burr Medico to see her in the infusion area during transfusion.  High priority scheduling message sent for Dr. Burr Medico  appt on 11/17/16. Pt aware and agreeable to the above at this time.

## 2016-11-14 NOTE — Progress Notes (Signed)
[facility] Progress Note   Michelle Erickson [id number] [birth year] 61 y.o.  Michelle Erickson is managed by Dr. Truitt Merle  Actively treated with chemotherapy: yes  Current Therapy: Lonsurf  Assessment: Plan:    Hyperbilirubinemia - Plan: diphenhydrAMINE (BENADRYL) tablet 25 mg, US Abdomen Limited RUQ, CANCELED: US Abdomen Limited  Itching - Plan: diphenhydrAMINE (BENADRYL) tablet 25 mg, US Abdomen Limited RUQ, CANCELED: US Abdomen Limited  Anemia due to antineoplastic chemotherapy - Plan: sodium chloride flush (NS) 0.9 % injection 3 mL, Prepare RBC, Transfuse RBC, heparin lock flush 100 unit/mL, diphenhydrAMINE (BENADRYL) capsule 25 mg, Practitioner attestation of consent, Complete patient signature process for consent form, Care order/instruction, heparin lock flush 100 unit/mL, sodium chloride flush (NS) 0.9 % injection 3 mL, Transfuse RBC, CANCELED: Practitioner attestation of consent, CANCELED: Complete patient signature process for consent form, CANCELED: Care order/instruction, CANCELED: Type and screen, CANCELED: Type and screen, CANCELED: Prepare RBC   Hyperbilirubinemia with itching: The patient's labs returned today showing a total bilirubin of 8.48 up from 1.45 at her labs from 4 weeks ago. Her alkaline phosphatase was elevated at 732, AST at 118 and ALT at 72. The patient was given Benadryl 25 mg by mouth 1 today for itching. She will return on 11/17/2016 and will have an ultrasound of her right upper quadrant completed. The patient was offered to be taken to the emergency room for an evaluation this afternoon which she declined.   Anemia due to antineoplastic chemotherapy: The patient will return on 11/17/2016 and will be transfused with 2 units of packed red blood cells given her hemoglobin of 7.2 today.   Please see After Visit Summary for patient specific instructions.  Future Appointments Date Time Provider [place]  11/17/2016 9:30 AM [facility] 2 [facility]  Accident  11/24/2016 2:30 PM [facility] LAB 2 [facility] None  11/24/2016 2:45 PM [facility] FLUSH NURSE 2 [facility] None  11/24/2016 3:15 PM Truitt Merle, MD [facility] None  12/15/2016 9:00 AM [facility] LAB ONLY [facility] None  12/15/2016 9:30 AM [facility] FLUSH NURSE 2 [facility] None    Orders Placed This Encounter  Procedures  . US Abdomen Limited RUQ  . Prepare RBC       Subjective:   Patient ID:  Michelle Erickson is a 61 y.o. (DOB 02-24-1955) female.  Chief Complaint:  Chief Complaint  Patient presents with  . 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 She is most recently been treated with Lonsurf. She presented to the office today with whole-body itching. She denies noting that she has jaundice and scleral icterus. She does report that her stools have been light in color. Her labs returned today showing a sodium of 132, potassium of 5.3, BUN of 31.1, creatinine of 1.8, total bilirubin of 8.48, alkaline phosphatase of 732, AST of 118, ALT 72 and albumin of 2.3. His CBC returned showing a hemoglobin of 7.2 and hematocrit of 21.9. The patient was offered the opportunity to be taken to the emergency room for evaluation of her hyperbilirubinemia and jaundice and to be transfused. She declines this and asked to return on Monday for an ultrasound and for a transfusion.  Medications: I have reviewed the patient's current medications.  Allergies: No Known Allergies  Past Medical History:  Diagnosis Date  . Back pain   . Diabetes mellitus without complication ([place])   . Family history of colon cancer   .  Hyperlipemia   . Hypertension   . Metastatic colon cancer to liver ([place]) 07/21/2014  . Snoring   . Wears glasses    contacts/glasses    Past Surgical History:  Procedure Laterality Date  . CATARACT EXTRACTION Bilateral   . MOUTH SURGERY    . PORTACATH PLACEMENT N/A 08/03/2014    Procedure: INSERTION PORT-A-CATH;  Surgeon: Stark Klein, MD;  Location: [facility] OR;  Service: General;  Laterality: N/A;    Family History  Problem Relation Age of Onset  . COPD Father   . Heart attack Father   . Arthritis Sister   . Diabetes Sister   . Colon cancer Sister        dx over 46  . Colon polyps Sister   . Seizures Brother   . Colon cancer Sister        dx in her early 66s, died in her early to mid 38s  . Diabetes Mother     Social History   Social History  . Marital status: Married    Spouse name: Charlotte Crumb  . Number of children: 2  . Years of education: N/A   Occupational History  . Not on file.   Social History Main Topics  . Smoking status: Former Smoker    Packs/day: 1.00    Years: 6.00    Types: Cigarettes    Quit date: 01/21/1976  . Smokeless tobacco: Never Used  . Alcohol use No  . Drug use: No  . Sexual activity: Yes    Birth control/ protection: Post-menopausal     Comment: # 2 pregnancies and #2 live births   Other Topics Concern  . Not on file   Social History Narrative   Married, husband [name]   Has #2 adult children   Previously worked at [employer]       Past Medical History, Surgical history, Social history, and Family history were reviewed and updated as appropriate.   Please see review of systems for further details on the patient's review from today.   Review of Systems:  Review of Systems  Constitutional: Positive for fatigue. Negative for chills, diaphoresis and fever.  Respiratory: Negative for cough and shortness of breath.   Cardiovascular: Negative for chest pain, palpitations and leg swelling.  Gastrointestinal: Negative for abdominal pain, diarrhea, nausea and vomiting.  Skin: Positive for color change.       itching    Objective:   Physical Exam:  BP (!) 155/72 (BP Location: Right Arm, Patient Position: Sitting)   Pulse 92   Temp 98.5 F (36.9 C) (Oral)   Resp 18   Ht 5\' 4"  (1.626 m)   Wt 140 lb 3.2 oz (63.6  kg)   SpO2 100%   BMI 24.07 kg/m  ECOG: 2  Physical Exam  Constitutional: No distress.  The patient is a chronically ill-appearing adult female with jaundice and scleral icterus.  HENT:  Head: Normocephalic and atraumatic.  Eyes: Scleral icterus is present.  Cardiovascular: Normal rate, regular rhythm and normal heart sounds.  Exam reveals no gallop and no friction rub.   No murmur heard. Pulmonary/Chest: Effort normal and breath sounds normal. No respiratory distress. She has no wheezes. She has no rales.  Abdominal: Soft. Bowel sounds are normal. She exhibits no distension and no mass. There is no tenderness. There is no rebound and no guarding.  Neurological: She is alert.  Skin: Skin is warm and dry. She is not diaphoretic.  The patient is jaundiced  Lab Review:     Component Value Date/Time   NA 132 (L) 11/14/2016 1449   K 5.3 No visable hemolysis (H) 11/14/2016 1449   CL 113 (H) 08/15/2016 0356   CO2 20 (L) 11/14/2016 1449   GLUCOSE 120 11/14/2016 1449   BUN 31.1 (H) 11/14/2016 1449   CREATININE 1.8 (H) 11/14/2016 1449   CALCIUM 9.4 11/14/2016 1449   PROT 7.2 11/14/2016 1449   ALBUMIN 2.3 (L) 11/14/2016 1449   AST 118 (H) 11/14/2016 1449   ALT 72 (H) 11/14/2016 1449   ALKPHOS 732 (H) 11/14/2016 1449   BILITOT 8.48 (HH) 11/14/2016 1449   GFRNONAA 27 (L) 08/15/2016 0356   GFRAA 31 (L) 08/15/2016 0356       Component Value Date/Time   WBC 8.7 11/14/2016 1449   WBC 2.6 (L) 08/14/2016 1026   RBC 2.48 (L) 11/14/2016 1449   RBC 3.19 (L) 08/14/2016 1026   HGB 7.2 (L) 11/14/2016 1449   HCT 21.9 (L) 11/14/2016 1449   PLT 234 11/14/2016 1449   MCV 88.3 11/14/2016 1449   MCH 29.0 11/14/2016 1449   MCH 28.8 08/14/2016 1026   MCHC 32.9 11/14/2016 1449   MCHC 32.5 08/14/2016 1026   RDW 17.8 (H) 11/14/2016 1449   LYMPHSABS 1.0 11/14/2016 1449   MONOABS 0.5 11/14/2016 1449   EOSABS 0.1 11/14/2016 1449   BASOSABS 0.0 11/14/2016 1449    -------------------------------  Imaging from last 24 hours (if applicable):  Radiology interpretation: No results found.      This patient was seen with Dr. Burr Medico with my treatment plan reviewed with her. She expressed agreement with my medical management of this patient.  I have seen the patient, examined her. I agree with the assessment and and plan and have edited the notes.   [place] came in today for lab, reports itching, was seen by our [facility]. Lab showed significantly elevated TBIL, likely due to cancer progression in liver, vs Lonsurf induced. I recommend her to be admitted to [facility] for further work up, pt declined. I will arrange abdominal US on Monday, followed by blood transfusion and F/U.  Truitt Merle  11/14/2016

## 2016-11-17 ENCOUNTER — Ambulatory Visit (HOSPITAL_BASED_OUTPATIENT_CLINIC_OR_DEPARTMENT_OTHER): Payer: BLUE CROSS/BLUE SHIELD | Admitting: Hematology

## 2016-11-17 ENCOUNTER — Ambulatory Visit: Payer: BLUE CROSS/BLUE SHIELD

## 2016-11-17 ENCOUNTER — Ambulatory Visit (HOSPITAL_COMMUNITY)
Admission: RE | Admit: 2016-11-17 | Discharge: 2016-11-17 | Disposition: A | Discharge status: Home / Self Care | Payer: BLUE CROSS/BLUE SHIELD | Source: Ambulatory Visit | Attending: Medical | Admitting: Medical

## 2016-11-17 DIAGNOSIS — N189 Chronic kidney disease, unspecified: Secondary | ICD-10-CM

## 2016-11-17 DIAGNOSIS — K8020 Calculus of gallbladder without cholecystitis without obstruction: Secondary | ICD-10-CM | POA: Diagnosis not present

## 2016-11-17 DIAGNOSIS — E119 Type 2 diabetes mellitus without complications: Secondary | ICD-10-CM | POA: Diagnosis not present

## 2016-11-17 DIAGNOSIS — C787 Secondary malignant neoplasm of liver and intrahepatic bile duct: Secondary | ICD-10-CM | POA: Insufficient documentation

## 2016-11-17 DIAGNOSIS — C180 Malignant neoplasm of cecum: Secondary | ICD-10-CM | POA: Diagnosis not present

## 2016-11-17 DIAGNOSIS — D6481 Anemia due to antineoplastic chemotherapy: Secondary | ICD-10-CM

## 2016-11-17 DIAGNOSIS — C7800 Secondary malignant neoplasm of unspecified lung: Secondary | ICD-10-CM | POA: Diagnosis not present

## 2016-11-17 DIAGNOSIS — T451X5A Adverse effect of antineoplastic and immunosuppressive drugs, initial encounter: Principal | ICD-10-CM

## 2016-11-17 DIAGNOSIS — L299 Pruritus, unspecified: Secondary | ICD-10-CM | POA: Insufficient documentation

## 2016-11-17 DIAGNOSIS — I10 Essential (primary) hypertension: Secondary | ICD-10-CM | POA: Diagnosis not present

## 2016-11-17 DIAGNOSIS — D630 Anemia in neoplastic disease: Secondary | ICD-10-CM | POA: Diagnosis not present

## 2016-11-17 LAB — CEA (IN HOUSE-CHCC): CEA (CHCC-In House): 1938.97 ng/mL — ABNORMAL HIGH (ref 0.00–5.00)

## 2016-11-17 LAB — IRON AND TIBC
%SAT: 15 % — AB (ref 21–57)
IRON: 34 ug/dL — AB (ref 41–142)
TIBC: 230 ug/dL — AB (ref 236–444)
UIBC: 196 ug/dL (ref 120–384)

## 2016-11-17 LAB — FERRITIN

## 2016-11-17 MED ORDER — DIPHENHYDRAMINE HCL 25 MG PO TABS
25.0000 mg | ORAL_TABLET | Freq: Once | ORAL | Status: AC
  Administered 2016-11-17: 25 mg via ORAL
  Filled 2016-11-17: qty 1

## 2016-11-17 NOTE — Progress Notes (Signed)
[facility]  [phone] [phone]  Clinic Follow Up Note   Patient Care Team: Dione Housekeeper, MD as PCP - General (Family Medicine) Tania Ade, RN as Registered Nurse (Oncology) 11/17/2016   CHIEF COMPLAINTS:  Discuss [place] result   Oncology History   7Metastatic colon cancer to liver   Staging form: Colon and Rectum, AJCC 7th Edition     Clinical: Stage Unknown ([place], NX, M1) - Unsigned        Metastatic colon cancer to liver ([place])   07/14/2014 Imaging    CT abdomen and pelvis with contrast showed a 6 cm segment of sigmoid bowel wall thickening with adjacent 14 x 12 mm nodules, diffuse hepatic metastasis. No bowel obstruction.      07/14/2014 Initial Diagnosis    Metastatic colon cancer to liver      07/25/2014 Procedure    Colonoscopy showed a near circumferential medial mass in the sigmoid colon, partially obstructing consistent with carcinoma.      07/25/2014 Initial Biopsy    Sigmoid: Biopsy showed invasive adenocarcinoma arising in a background of high-grade dysplasia.      07/28/2014 Imaging    CT chest showed no evidence of metastasis      08/09/2014 - 01/16/2016 Chemotherapy    mFOLFOX6 every 2 weeks, dose reduction from cycle 7 due to cytopenia, Avastin added from cycle 3, stopped due to disease progression       08/11/2014 - 08/15/2014 Hospital Admission    Pt was admitted for fever and nausea, treated for UTI, liver biopsy cancelled due to the infection       10/29/2015 Imaging    CT chest, abdomen and pelvis with contrast 10/29/2015 IMPRESSION: 1. New/enlarging pulmonary nodules, most notably the 8 by 6 mm left lower lobe pulmonary nodule, favoring metastatic disease. 2. The liver lesions are subjectively grossly similar in size, difficult to measure due to the lack of IV contrast causing poor boundaries. 3. There is some faint heterogeneity of bony density in the  sternum, but this is not significantly changed from 07/28/2014 and is probably incidental given the lack of other evidence of osseous metastatic disease. 4. Lower lumbar spondylosis and degenerative disc disease causing impingement. 5. Coronary, aortic arch, and branch vessel atherosclerotic vascular disease. Aortoiliac atherosclerotic vascular disease. 6. Mildly dilated esophagus with air-fluid level, probably reflecting dysmotility. 7. Cholelithiasis. 8. Thick wall urinary bladder, cystitis not excluded.      01/28/2016 Progression    Restaging CT showed disease progression in lungs, and probably progression in liver mets also      02/14/2016 PET scan    IMPRESSION: 1. Examination is positive for hypermetabolic tumor within both lobes of liver. Index measurements provided above. 2. There are scattered pulmonary nodules within both lungs, most of these are too small to reliably characterize by PET-CT. In the left lower lobe there is a lung nodule which exhibits malignant range FDG uptake measuring 9 mm. 3. Aortic atherosclerosis and coronary artery calcifications.      02/20/2016 -  Chemotherapy    Second line chemo FOLFIRI and Avastin, every 2 weeks       03/28/2016 Imaging    CT Abdomen pelvis without contrast 03/28/2016 IMPRESSION: 1. Diffuse circumferential wall thickening throughout the colon, suggesting acute colitis. Intraluminal fluid density compatible with associated diarrheal illness. 2. Superimposed nodular area of bowel wall thickening within the sigmoid colon suspected to be related to patient's known primary adenocarcinoma. 3. Poor visualization of known hepatic metastases.  No other evidence for metastatic disease within the abdomen and pelvis. Previously identified pulmonary nodules within the left lower and right middle lobes have slightly increased in size, concerning for slightly progressive metastatic disease. 4. Cholelithiasis. 5. Diffuse atherosclerosis  with 3 vessel coronary artery calcifications.      05/26/2016 PET scan    PET 05/26/16: IMPRESSION: 1. Overall relatively stable pulmonary and liver metastases. No new sites of hypermetabolic metastatic disease. 2. Dominant hypermetabolic left lower lobe lung metastasis is mildly increased in size and stable in metabolism. Additional subcentimeter solid pulmonary nodules are below PET resolution and not convincingly changed in size. 3. Hypermetabolic liver metastases are stable in number with mixed mild metabolic changes as detailed. 4. Additional findings include aortic atherosclerosis, coronary atherosclerosis and cholelithiasis.      06/08/2016 -  Hospital Admission    Patient presents to the ER due to nausea and dehydration       08/11/2016 - 08/15/2016 Hospital Admission    Admit date: 08/11/16 Admission diagnosis: intractable nausea and vomiting secondary to chemotherapy and underlying cancer Additional comments: I will likely change her chemo regimen if she recovers well and wants to continue treatment      09/09/2016 PET scan    1. Significant extravasation of radiopharmaceutical into the right upper arm may limit the sensitivity of this examination. 2. Progressive bilateral pulmonary metastatic disease. The dominant lesion in the left lower lobe has increased in size, although demonstrates decreased metabolic activity compared with the prior study. 3. Intensity of activity within the multifocal hepatic metastatic disease has also improved. The individual lesions are ill-defined and difficult to measure on the noncontrast CT images. 4. Interval development of nodal metastases within the gastrohepatic ligament.       HISTORY OF PRESENTING ILLNESS:  Michelle Erickson 61 y.o. female is here because of abnormal CT findings which is highly suspecious for metastatic colon cancer.   She presented intermittent nausea, anorexia and abdominal pain since Nov 2015, she has mild pain in  the LUQ, crapy pain, positional (bending over),   it happened once every 2-3 weeks, and it has been more frequent in the past month. She has normal BM, but did notice the caliber of the stool is smaller than before. She denies any melena or hematochezia. She lost about 13 lbs in the past 6 months.   She presents to local emergency room in November 2015, and May 2016, was felt to be related to gastric virus. Due to the worsening symptoms lately, she went to [facility] ED on 07/13/2014, CT abdomen was obtained which showed multiple large liver metastasis and probable sigmoid colon mass. She was referred to [place] for further workup. She is scheduled to see GI Dr. Olevia Perches this afternoon.  CURRENT THERAPY: Third line therapy with Lonsurf started on 09/23/16, will hold it for now   INTERIM HISTORY:  Michelle Erickson had abdominal ultrasound done this morning, and returns for blood transfusion and follow-up.  I saw her in the infusion room along with her daughter and husband. She states her pruritus has improved with Benadryl, she feels better overall.  Denies any significant abdominal pain or other new complaints.  Her appetite is decent, weight is stable.  MEDICAL HISTORY Past Medical History:  Diagnosis Date  . Back pain   . Diabetes mellitus without complication ([place])   . Family history of colon cancer   . Hyperlipemia   . Hypertension   . Metastatic colon cancer to liver ([place]) 07/21/2014  .  Snoring   . Wears glasses    contacts/glasses   SURGICAL HISTORY: Past Surgical History:  Procedure Laterality Date  . CATARACT EXTRACTION Bilateral   . MOUTH SURGERY    . PORTACATH PLACEMENT N/A 08/03/2014   Procedure: INSERTION PORT-A-CATH;  Surgeon: Stark Klein, MD;  Location: [facility];  Service: General;  Laterality: N/A;   SOCIAL HISTORY: Social History   Social History  . Marital status: Married    Spouse name: Charlotte Crumb  . Number of children: 2  . Years of education: N/A   Occupational History  . Not  on file.   Social History Main Topics  . Smoking status: Former Smoker    Packs/day: 1.00    Years: 6.00    Types: Cigarettes    Quit date: 01/21/1976  . Smokeless tobacco: Never Used  . Alcohol use No  . Drug use: No  . Sexual activity: Yes    Birth control/ protection: Post-menopausal     Comment: # 2 pregnancies and #2 live births   Other Topics Concern  . Not on file   Social History Narrative   Married, husband [name]   Has #2 adult children   Previously worked at [employer] HISTORY: Family History  Problem Relation Age of Onset  . COPD Father   . Heart attack Father   . Arthritis Sister   . Diabetes Sister   . Colon cancer Sister        dx over 4  . Colon polyps Sister   . Seizures Brother   . Colon cancer Sister        dx in her early 73s, died in her early to mid 32s  . Diabetes Mother    ALLERGIES:  has No Known Allergies.  MEDICATIONS:  Current Outpatient Prescriptions  Medication Sig Dispense Refill  . amLODipine (NORVASC) 10 MG tablet Take 10 mg by mouth every morning.     Marland Kitchen atorvastatin (LIPITOR) 40 MG tablet Take 40 mg by mouth every evening.    . diphenoxylate-atropine (LOMOTIL) 2.5-0.025 MG tablet Take 1-8 tablets by mouth 4 (four) times daily as needed for diarrhea or loose stools. (Patient not taking: Reported on 11/14/2016) 60 tablet 1  . ferrous sulfate 325 (65 FE) MG tablet Take 325 mg by mouth daily with breakfast.    . Insulin Glargine (LANTUS SOLOSTAR) 100 UNIT/ML Solostar Pen Inject 10 Units into the skin daily at 10 pm.    . insulin lispro (HUMALOG) 100 UNIT/ML injection Inject 4-8 Units into the skin 2 (two) times daily as needed for high blood sugar. Sliding scale    . lidocaine-prilocaine (EMLA) cream Apply 1 application topically once.     . ondansetron (ZOFRAN) 8 MG tablet Take 0.5-1 tablets (4-8 mg total) by mouth 2 (two) times daily. Take 1 hour prior to Lonsurf dose for nausea prophylaxis 30 tablet 3  . promethazine  (PHENERGAN) 25 MG tablet Take 0.5-1 tablets (12.5-25 mg total) by mouth every 8 (eight) hours as needed for nausea or vomiting. 60 tablet 2  . traMADol (ULTRAM) 50 MG tablet Take 1 tablet (50 mg total) by mouth every 6 (six) hours as needed. (Patient taking differently: Take 50 mg by mouth every 6 (six) hours as needed for moderate pain. ) 30 tablet 1  . trifluridine-tipiracil (LONSURF) 20-8.19 MG tablet TAKE 2 TABLETS ('40MG'$ ) BY MOUTH TWICE DAILY,1 HOUR AFTER AM & PM MEALS ON DAYS 1-5, 8-12. REPEAT EVERY 28 DAYS 40 tablet 0  No current facility-administered medications for this visit.    Facility-Administered Medications Ordered in Other Visits  Medication Dose Route Frequency Provider Last Rate Last Dose  . sodium chloride 0.9 % injection 10 mL  10 mL Intravenous PRN Malachy Mood, MD   10 mL at 05/14/16 0753   REVIEW OF SYSTEMS:   Constitutional: Denies fevers, chills or abnormal night sweats. (+) fatigue (+) decreased appetite, improving Eyes: Denies blurriness of vision, double vision or watery eyes Ears, nose, mouth, throat, and face: Denies mucositis or sore throat Respiratory: Denies cough, dyspnea or wheezes Cardiovascular: Denies palpitation, chest discomfort Gastrointestinal:  Denies heartburn, nausea, vomiting, constipation, or diarrhea. (+) BM normal  Skin: Denies abnormal skin rashes. (+) generalized itching, except face and feet Lymphatics: Denies new lymphadenopathy or easy bruising Neurological:Denies numbness, tingling or new weaknesses Behavioral/Psych: Mood is stable, no new changes  All other systems were reviewed with the patient and are negative.  PHYSICAL EXAMINATION: ECOG PERFORMANCE STATUS: 1 Blood pressure 132/71, heart rate 84, temperature 36.8, respirations 17, pulse ox 100% on room air GENERAL:alert, no distress and comfortable. SKIN: skin color, texture, turgor are normal, no rashes or significant lesions, except significant jaundice EYES: normal, conjunctiva  are pink and non-injected, sclera clear OROPHARYNX:no exudate, no erythema and lips, buccal mucosa, and tongue normal  NECK: supple, thyroid normal size, non-tender, without nodularity LYMPH:  no palpable lymphadenopathy in the cervical, axillary or inguinal LUNGS: clear to auscultation and percussion with normal breathing effort HEART: regular rate & rhythm and no murmurs and no lower extremity edema ABDOMEN:abdomen soft, non-tender and normal bowel sounds Musculoskeletal:no cyanosis of digits and no clubbing  PSYCH: alert & oriented x 3 with fluent speech NEURO: no focal motor/sensory deficits  LABORATORY DATA:  I have reviewed the data as listed CBC Latest Ref Rng & Units 11/14/2016 10/17/2016 10/08/2016  WBC 3.9 - 10.3 10e3/uL 8.7 2.4(L) 4.8  Hemoglobin 11.6 - 15.9 g/dL 7.2(L) 8.2(L) 7.6(L)  Hematocrit 34.8 - 46.6 % 21.9(L) 24.9(L) 23.9(L)  Platelets 145 - 400 10e3/uL 234 180 161   CMP Latest Ref Rng & Units 11/14/2016 10/17/2016 10/08/2016  Glucose 70 - 140 mg/dl 143 91 888(L)  BUN 7.0 - 26.0 mg/dL 31.1(H) 28.9(H) 31.4(H)  Creatinine 0.6 - 1.1 mg/dL 5.7(V) 7.2(Q) 2.0(U)  Sodium 136 - 145 mEq/L 132(L) 139 136  Potassium 3.5 - 5.1 mEq/L 5.3 No visable hemolysis(H) 5.0 5.4 No visable hemolysis(H)  Chloride 101 - 111 mmol/L - - -  CO2 22 - 29 mEq/L 20(L) 22 22  Calcium 8.4 - 10.4 mg/dL 9.4 9.7 9.5  Total Protein 6.4 - 8.3 g/dL 7.2 7.8 7.3  Total Bilirubin 0.20 - 1.20 mg/dL 0.15(IF) 5.37(H) 4.32(X)  Alkaline Phos 40 - 150 U/L 732(H) 858(H) 848(H)  AST 5 - 34 U/L 118(H) 87(H) 91(H)  ALT 0 - 55 U/L 72(H) 66(H) 75(H)   CEA (0-5ng/ml) 07/21/2014: 594.5 11/23/2014: 18.2 03/07/2015: 6.6 09/19/2015: 37.9 10/29/2015: 63.7 11/28/2015: 28.99 01/02/2016: 40.81 01/28/2015: 47 03/05/16: 62.66 04/16/16: 92.49 05/14/16: 96.62 06/11/16: 145.13 07/09/2016: 180.53 08/06/16: 372.79 09/10/16: 704.73 10/08/16 1070.79 11/14/16: 1938.97  PATHOLOGY REPORTS: Diagnosis 07/25/2014  Surgical [P], sigmoid -  INVASIVE ADENOCARCINOMA ARISING IN A BACKGROUND OF HIGH GRADE DYSPLASIA. - SEE COMMENT.  Diagnosis 09/05/2014 Liver, biopsy - METASTATIC ADENOCARCINOMA, SEE COMMENT.   RADIOGRAPHIC STUDIES: I have personally reviewed the radiological images as listed and agreed with the findings in the report.  PET 09/09/2016 IMPRESSION: 1. Significant extravasation of radiopharmaceutical into the right upper arm may limit the sensitivity  of this examination. 2. Progressive bilateral pulmonary metastatic disease. The dominant lesion in the left lower lobe has increased in size, although demonstrates decreased metabolic activity compared with the prior study. 3. Intensity of activity within the multifocal hepatic metastatic disease has also improved. The individual lesions are ill-defined and difficult to measure on the noncontrast CT images. 4. Interval development of nodal metastases within the gastrohepatic ligament.  PET 05/26/16: IMPRESSION: 1. Overall relatively stable pulmonary and liver metastases. No new sites of hypermetabolic metastatic disease. 2. Dominant hypermetabolic left lower lobe lung metastasis is mildly increased in size and stable in metabolism. Additional subcentimeter solid pulmonary nodules are below PET resolution and not convincingly changed in size. 3. Hypermetabolic liver metastases are stable in number with mixed mild metabolic changes as detailed. 4. Additional findings include aortic atherosclerosis, coronary atherosclerosis and cholelithiasis.   PET 02/14/2016 IMPRESSION: 1. Examination is positive for hypermetabolic tumor within both lobes of liver. Index measurements provided above. 2. There are scattered pulmonary nodules within both lungs, most of these are too small to reliably characterize by PET-CT. In the left lower lobe there is a lung nodule which exhibits malignant range FDG uptake measuring 9 mm. 3. Aortic atherosclerosis and coronary artery  calcifications.  Colonoscopy 07/25/2014 Dr. Olevia Perches  ENDOSCOPIC IMPRESSION: Near circumferential medium mass was found in the sigmoid colon; multiple biopsies were performed using cold forceps, partially obstructing mass consistent with carcinoma. Placement of endoclips and tattoo at the margins of the mass which extends from 18-25 cm from the anus  ASSESSMENT & PLAN: 61 y.o. African-American female, presented with intermittent nausea vomiting and anemia.   1. 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 -The natural history of metastatic colon cancer and treatment options were discussed with patient and her husband. Giving the diffuse metastasis in the liver, this is unfortunately incurable disease. I recommend systemic chemotherapy to control her metastatic disease, the goal of therapy is palliative and to prolong her life. -I previously reviewed her restaging CT scan from 01/28/2016, which showed slight disease progression in lungs, especailly the LLL lung nodule. Due to lack of CT contrast, it is difficult to evaluate the metastasis in the liver, but it appears to be progression also. -I previously discussed her PET scan findings, which showed hypermetabolic multiple liver metastasis and a few lung metastasis.  -she is on second line chemo FOLFIRI and Avastin, tolerating well overall  -Due to her severe diarrhea from cycle 3 chemotherapy, I previously reduced Irinotecan dose slightly, she is tolerating much better. -Pet scan from 05/26/16 reviewed with pt and her husband. It showed stable disease overall, mild slightly increased size of one lung met, no other new lesions, will continue chemo.    -Due to her persistent proteinuria and started worsening renal function, I have held Avastin permanently from 07/24/2016 -she tolerated FOLFIRI poorly overall, with severe diarrhea which required hospital admission twice, despite dose reduction and aggressive antidiarrhea medications. So I recommend her to stop FOLFIRI.  - I discussed her 09/09/2016 PET findings, which showed progressive bilateral pulmonary metastatic disease. The dominant lesion in the left lower lobe has increased in size, although demonstrates decreased metabolic activity compared with the prior study. Intensity of activity within the multifocal hepatic  metastatic disease has also improved, however she had suboptimals dose contrast due to contrast leakage. The individual lesions are ill-defined and difficult to measure on the non-contrast CT images. I think the scan overall showed stable disease/slight worse of lung metastasis.  -She began palliative third line Lonsurf on 09/23/16.  -Due to her CKD, I will reduced her dose by 33% ('40mg'$ /16.'38mg'$  bid on day 1-5 and day 8-12 every 28 days) -Lab after first cycle of lonsurf, she was noticed to have significant hyperbilirubinemia, suspect disease progression in the liver. -will hold her Lonsurf for now, and monitor lab  -If her hyperbilirubinemia does not resolve by holding Lonsurf, I will likely recommend hospice.  She would not be a candidate for further chemo treatment due to her significant liver failure.  2.  Hyperbilirubinemia -Developed after first cycle dose of Lonsurf -I reviewed her abdominal ultrasound from this morning, which showed numerous liver metastasis, no significant biliary dilatation.  I suspect she has liver metastasis progression, however it is difficult to compare ultrasound with her prior CT and a PET scan. 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 -Her ferritin was previously normal, serum iron and saturation slightly low, TIBC low normal, I suggest her to take oral iron supplement, she tolerates well, we'll continue once daily, with orange juice or vitamin C. -Iron 03/05/2016 was 64 Her HGB is 7.6 (10/08/16); she feels well so we will delay transfusion for now - will continue monitoring   4. DM, HTN -Continue follow-up with PCP - we previously discussed the potential impact of chemotherapy and premedication dexamethasone on her blood pressure and sugar,will  Monitor closely. Her BG has been well controlled lately  -I previously encouraged her to measure her blood pressure at home. If SBP persistently high than 150, I'll consider adding on a new antihypertensive medication. She will follow-up with her primary care physician also.   5. 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 Will continue monitoring renal function  -I previously encourage her to drink more fluids -We'll avoid NSAIDs and IV contrast for CT scan in the future. -Avastin has been discontinued due to her persistent proteinuria and worsening renal function.  6. Goal of care discussion  -We again discussed the incurable nature of her cancer, and the overall poor prognosis, especially if she does not have good response to chemotherapy or progress on chemo -The patient understands the goal of care is palliative. -I recommended DNR/DNI, she will think about it.  7. Hyperkalemia -K 5.4 previously (10/08/16), reviewed with patient; encouraged to avoid bananas and potassium-rich foods for now -repeat lab   Plan -She received 2 units of blood transfusion for  anemia today -We reviewed her abdominal ultrasound results, she is not a candidate for stent placement or percutaneous biliary drainage for her hyperbilirubinemia -will hold Lonsurf -lab and f/u next week    I spent 20 minutes counseling the patient face to face. The total time spent in the appointment was 25 minutes and more than 50% was on counseling.     Truitt Merle, MD 11/17/2016

## 2016-11-17 NOTE — Patient Instructions (Signed)

## 2016-11-18 ENCOUNTER — Encounter: Payer: Self-pay | Admitting: Hematology

## 2016-11-18 LAB — BPAM RBC
BLOOD PRODUCT EXPIRATION DATE: 201811252359
Blood Product Expiration Date: 201811252359
ISSUE DATE / TIME: 201810291203
ISSUE DATE / TIME: 201810291203
UNIT TYPE AND RH: 5100
UNIT TYPE AND RH: 5100

## 2016-11-18 LAB — TYPE AND SCREEN
ABO/RH(D): O POS
ANTIBODY SCREEN: NEGATIVE
UNIT DIVISION: 0
Unit division: 0

## 2016-11-19 ENCOUNTER — Other Ambulatory Visit: Payer: Self-pay | Admitting: Hematology

## 2016-11-19 DIAGNOSIS — C189 Malignant neoplasm of colon, unspecified: Secondary | ICD-10-CM

## 2016-11-19 DIAGNOSIS — C787 Secondary malignant neoplasm of liver and intrahepatic bile duct: Principal | ICD-10-CM

## 2016-11-22 ENCOUNTER — Inpatient Hospital Stay (HOSPITAL_COMMUNITY)
Admission: EM | Admit: 2016-11-22 | Discharge: 2016-11-26 | DRG: 375 | Disposition: A | Discharge status: Home Health | Payer: BLUE CROSS/BLUE SHIELD | Attending: Nephrology | Admitting: Nephrology

## 2016-11-22 ENCOUNTER — Inpatient Hospital Stay (HOSPITAL_COMMUNITY): Payer: BLUE CROSS/BLUE SHIELD

## 2016-11-22 ENCOUNTER — Encounter (HOSPITAL_COMMUNITY): Payer: Self-pay | Admitting: Emergency Medicine

## 2016-11-22 ENCOUNTER — Emergency Department (HOSPITAL_COMMUNITY): Payer: BLUE CROSS/BLUE SHIELD

## 2016-11-22 DIAGNOSIS — E785 Hyperlipidemia, unspecified: Secondary | ICD-10-CM | POA: Diagnosis present

## 2016-11-22 DIAGNOSIS — N179 Acute kidney failure, unspecified: Secondary | ICD-10-CM | POA: Diagnosis present

## 2016-11-22 DIAGNOSIS — K8010 Calculus of gallbladder with chronic cholecystitis without obstruction: Secondary | ICD-10-CM | POA: Diagnosis present

## 2016-11-22 DIAGNOSIS — I129 Hypertensive chronic kidney disease with stage 1 through stage 4 chronic kidney disease, or unspecified chronic kidney disease: Secondary | ICD-10-CM | POA: Diagnosis present

## 2016-11-22 DIAGNOSIS — Z833 Family history of diabetes mellitus: Secondary | ICD-10-CM

## 2016-11-22 DIAGNOSIS — C180 Malignant neoplasm of cecum: Secondary | ICD-10-CM | POA: Diagnosis not present

## 2016-11-22 DIAGNOSIS — R630 Anorexia: Secondary | ICD-10-CM | POA: Diagnosis present

## 2016-11-22 DIAGNOSIS — C187 Malignant neoplasm of sigmoid colon: Principal | ICD-10-CM | POA: Diagnosis present

## 2016-11-22 DIAGNOSIS — R17 Unspecified jaundice: Secondary | ICD-10-CM | POA: Diagnosis present

## 2016-11-22 DIAGNOSIS — N183 Chronic kidney disease, stage 3 (moderate): Secondary | ICD-10-CM | POA: Diagnosis present

## 2016-11-22 DIAGNOSIS — C7800 Secondary malignant neoplasm of unspecified lung: Secondary | ICD-10-CM | POA: Diagnosis not present

## 2016-11-22 DIAGNOSIS — E1122 Type 2 diabetes mellitus with diabetic chronic kidney disease: Secondary | ICD-10-CM | POA: Diagnosis present

## 2016-11-22 DIAGNOSIS — Z800 Family history of malignant neoplasm of digestive organs: Secondary | ICD-10-CM

## 2016-11-22 DIAGNOSIS — R112 Nausea with vomiting, unspecified: Secondary | ICD-10-CM | POA: Diagnosis present

## 2016-11-22 DIAGNOSIS — K7290 Hepatic failure, unspecified without coma: Secondary | ICD-10-CM | POA: Diagnosis present

## 2016-11-22 DIAGNOSIS — K831 Obstruction of bile duct: Secondary | ICD-10-CM | POA: Diagnosis not present

## 2016-11-22 DIAGNOSIS — I10 Essential (primary) hypertension: Secondary | ICD-10-CM | POA: Diagnosis not present

## 2016-11-22 DIAGNOSIS — C787 Secondary malignant neoplasm of liver and intrahepatic bile duct: Secondary | ICD-10-CM | POA: Diagnosis present

## 2016-11-22 DIAGNOSIS — Z7189 Other specified counseling: Secondary | ICD-10-CM | POA: Diagnosis not present

## 2016-11-22 DIAGNOSIS — K819 Cholecystitis, unspecified: Secondary | ICD-10-CM

## 2016-11-22 DIAGNOSIS — R933 Abnormal findings on diagnostic imaging of other parts of digestive tract: Secondary | ICD-10-CM

## 2016-11-22 DIAGNOSIS — Z8249 Family history of ischemic heart disease and other diseases of the circulatory system: Secondary | ICD-10-CM

## 2016-11-22 DIAGNOSIS — N390 Urinary tract infection, site not specified: Secondary | ICD-10-CM | POA: Diagnosis not present

## 2016-11-22 DIAGNOSIS — Z794 Long term (current) use of insulin: Secondary | ICD-10-CM

## 2016-11-22 DIAGNOSIS — C189 Malignant neoplasm of colon, unspecified: Secondary | ICD-10-CM

## 2016-11-22 DIAGNOSIS — Z79899 Other long term (current) drug therapy: Secondary | ICD-10-CM | POA: Diagnosis not present

## 2016-11-22 DIAGNOSIS — E119 Type 2 diabetes mellitus without complications: Secondary | ICD-10-CM | POA: Diagnosis not present

## 2016-11-22 DIAGNOSIS — Z87891 Personal history of nicotine dependence: Secondary | ICD-10-CM

## 2016-11-22 DIAGNOSIS — N189 Chronic kidney disease, unspecified: Secondary | ICD-10-CM | POA: Diagnosis not present

## 2016-11-22 DIAGNOSIS — Z515 Encounter for palliative care: Secondary | ICD-10-CM | POA: Diagnosis not present

## 2016-11-22 LAB — URINALYSIS, MICROSCOPIC (REFLEX): RBC / HPF: NONE SEEN RBC/hpf (ref 0–5)

## 2016-11-22 LAB — CBC
HCT: 30.5 % — ABNORMAL LOW (ref 36.0–46.0)
Hemoglobin: 10.4 g/dL — ABNORMAL LOW (ref 12.0–15.0)
MCH: 30 pg (ref 26.0–34.0)
MCHC: 34.1 g/dL (ref 30.0–36.0)
MCV: 87.9 fL (ref 78.0–100.0)
Platelets: 180 10*3/uL (ref 150–400)
RBC: 3.47 MIL/uL — ABNORMAL LOW (ref 3.87–5.11)
RDW: 19.2 % — ABNORMAL HIGH (ref 11.5–15.5)
WBC: 4.5 10*3/uL (ref 4.0–10.5)

## 2016-11-22 LAB — COMPREHENSIVE METABOLIC PANEL
ALBUMIN: 2.1 g/dL — AB (ref 3.5–5.0)
ALK PHOS: 514 U/L — AB (ref 38–126)
ALT: 83 U/L — AB (ref 14–54)
ANION GAP: 10 (ref 5–15)
AST: 218 U/L — AB (ref 15–41)
BUN: 45 mg/dL — AB (ref 6–20)
CALCIUM: 9.3 mg/dL (ref 8.9–10.3)
CO2: 20 mmol/L — AB (ref 22–32)
Chloride: 103 mmol/L (ref 101–111)
Creatinine, Ser: 1.97 mg/dL — ABNORMAL HIGH (ref 0.44–1.00)
GFR calc Af Amer: 30 mL/min — ABNORMAL LOW (ref >60)
GFR calc non Af Amer: 26 mL/min — ABNORMAL LOW (ref >60)
GLUCOSE: 158 mg/dL — AB (ref 65–99)
Potassium: 5 mmol/L (ref 3.5–5.1)
SODIUM: 133 mmol/L — AB (ref 135–145)
Total Bilirubin: 20.9 mg/dL (ref 0.3–1.2)
Total Protein: 7 g/dL (ref 6.5–8.1)

## 2016-11-22 LAB — LIPASE, BLOOD: Lipase: 13 U/L (ref 11–51)

## 2016-11-22 LAB — URINALYSIS, ROUTINE W REFLEX MICROSCOPIC
Glucose, UA: 250 mg/dL — AB
Ketones, ur: NEGATIVE mg/dL
Leukocytes, UA: NEGATIVE
Nitrite: NEGATIVE
Protein, ur: >300 mg/dL — AB
Specific Gravity, Urine: >1.03 — ABNORMAL HIGH (ref 1.005–1.030)
pH: 5 (ref 5.0–8.0)

## 2016-11-22 LAB — GLUCOSE, CAPILLARY
Glucose-Capillary: 117 mg/dL — ABNORMAL HIGH (ref 65–99)
Glucose-Capillary: 104 mg/dL — ABNORMAL HIGH (ref 65–99)

## 2016-11-22 MED ORDER — INSULIN ASPART 100 UNIT/ML ~~LOC~~ SOLN
0.0000 [IU] | Freq: Three times a day (TID) | SUBCUTANEOUS | Status: DC
  Administered 2016-11-23: 5 [IU] via SUBCUTANEOUS
  Administered 2016-11-23: 3 [IU] via SUBCUTANEOUS
  Administered 2016-11-25: 2 [IU] via SUBCUTANEOUS
  Administered 2016-11-25 – 2016-11-26 (×2): 1 [IU] via SUBCUTANEOUS

## 2016-11-22 MED ORDER — LIDOCAINE-PRILOCAINE 2.5-2.5 % EX CREA
TOPICAL_CREAM | Freq: Once | CUTANEOUS | Status: AC
  Administered 2016-11-22: 08:00:00 via TOPICAL
  Filled 2016-11-22: qty 5

## 2016-11-22 MED ORDER — SODIUM CHLORIDE 0.9% FLUSH: 10.0000 mL | INTRAVENOUS | Status: DC | PRN

## 2016-11-22 MED ORDER — ENOXAPARIN SODIUM 30 MG/0.3ML ~~LOC~~ SOLN
30.0000 mg | SUBCUTANEOUS | Status: DC
  Administered 2016-11-22 – 2016-11-25 (×4): 30 mg via SUBCUTANEOUS
  Filled 2016-11-22 (×4): qty 0.3

## 2016-11-22 MED ORDER — LOPERAMIDE HCL 2 MG PO CAPS: 2.0000 mg | ORAL_CAPSULE | Freq: Three times a day (TID) | ORAL | Status: DC | PRN

## 2016-11-22 MED ORDER — ONDANSETRON HCL 4 MG/2ML IJ SOLN
4.0000 mg | Freq: Four times a day (QID) | INTRAMUSCULAR | Status: DC | PRN
  Administered 2016-11-25 – 2016-11-26 (×2): 4 mg via INTRAVENOUS
  Filled 2016-11-22 (×2): qty 2

## 2016-11-22 MED ORDER — SODIUM CHLORIDE 0.9% FLUSH
10.0000 mL | Freq: Two times a day (BID) | INTRAVENOUS | Status: DC
  Administered 2016-11-25: 10 mL

## 2016-11-22 MED ORDER — SODIUM CHLORIDE 0.9 % IV BOLUS (SEPSIS)
500.0000 mL | Freq: Once | INTRAVENOUS | Status: AC
  Administered 2016-11-22: 500 mL via INTRAVENOUS

## 2016-11-22 MED ORDER — GADOBENATE DIMEGLUMINE 529 MG/ML IV SOLN
20.0000 mL | Freq: Once | INTRAVENOUS | Status: AC | PRN
  Administered 2016-11-22: 14 mL via INTRAVENOUS

## 2016-11-22 MED ORDER — LIDOCAINE-PRILOCAINE 2.5-2.5 % EX CREA: 1.0000 "application " | TOPICAL_CREAM | Freq: Once | CUTANEOUS | Status: DC

## 2016-11-22 MED ORDER — AMLODIPINE BESYLATE 5 MG PO TABS
5.0000 mg | ORAL_TABLET | Freq: Every morning | ORAL | Status: DC
  Administered 2016-11-22 – 2016-11-26 (×5): 5 mg via ORAL
  Filled 2016-11-22 (×5): qty 1

## 2016-11-22 MED ORDER — TRAMADOL HCL 50 MG PO TABS
50.0000 mg | ORAL_TABLET | Freq: Four times a day (QID) | ORAL | Status: DC | PRN
Start: 2016-11-22 — End: 2016-11-26
  Administered 2016-11-22 – 2016-11-26 (×3): 50 mg via ORAL
  Filled 2016-11-22 (×3): qty 1

## 2016-11-22 MED ORDER — DEXTROSE 5 % IV SOLN
2.0000 g | INTRAVENOUS | Status: DC
  Administered 2016-11-22 – 2016-11-24 (×3): 2 g via INTRAVENOUS
  Filled 2016-11-22 (×3): qty 2

## 2016-11-22 MED ORDER — ONDANSETRON 4 MG PO TBDP: 4.0000 mg | ORAL_TABLET | Freq: Once | ORAL | Status: DC | PRN

## 2016-11-22 MED ORDER — MORPHINE SULFATE (PF) 2 MG/ML IV SOLN: 1.0000 mg | INTRAVENOUS | Status: DC | PRN

## 2016-11-22 MED ORDER — SODIUM CHLORIDE 0.9 % IV SOLN
INTRAVENOUS | Status: DC
  Administered 2016-11-22 – 2016-11-25 (×4): via INTRAVENOUS
  Administered 2016-11-25: 75 mL/h via INTRAVENOUS
  Administered 2016-11-26: 04:00:00 via INTRAVENOUS

## 2016-11-22 NOTE — ED Provider Notes (Signed)
[facility] DEPT Provider Note   CSN: [id number] Arrival date & time: 11/22/16  0330     History   Chief Complaint Chief Complaint  Patient presents with  . Nausea    HPI Michelle Erickson is a 61 y.o. 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 Husband reports that patient has experienced generalized weakness and nausea for some time due to the cancer treatments. He reports that over the last 2-3 days, the symptoms have significant worsened. He reports that patient has not wanted to eat at all despite taking her nausea medications. He also reports multiple episodes of emesis whenever she tries to eat anything. No blood in the emesis. He also notes that over the last 24 hours, her skin and her eyes have become more yellow. He reports that at baseline for the last several months, she has had some mild discoloration to her skin but that in the last 24 hours it has spread to her eyes and has worsened. Patient is currently on 1 sitter for chemotherapy treatment, which she started on 09/23/16. Husband reports the patient had a blood transfusion and naproxen 6 days ago because her hemoglobin dropped onto 7. Patient denies any fever, chest pain, CP, abdominal pain  Heme/Onc: Burr Medico  The history is provided by the patient and the spouse.    Past Medical History:  Diagnosis Date  . Back pain   . Diabetes mellitus without complication ([place])   . Family history of colon cancer   . Hyperlipemia   . Hypertension   . Metastatic colon cancer to liver ([place]) 07/21/2014  . Snoring   . Wears glasses    contacts/glasses    Patient Active Problem List   Diagnosis Date Noted  . Hyperbilirubinemia 11/18/2016  . CKD stage 3 due to type 2 diabetes mellitus ([place]) 08/12/2016  . Acute lower UTI 08/12/2016  . Goals of care,  counseling/discussion 01/31/2016  . Port catheter in place 05/16/2015  . Genetic testing 08/31/2014  . 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 DM (diabetes mellitus), type 2 ([place]) 08/11/2014  . Intractable nausea and vomiting 08/11/2014  . Family history of colon cancer   . Metastatic colon cancer to liver ([place]) 07/21/2014    Past Surgical History:  Procedure Laterality Date  . CATARACT EXTRACTION Bilateral   . MOUTH SURGERY    . PORTACATH PLACEMENT N/A 08/03/2014   Procedure: INSERTION PORT-A-CATH;  Surgeon: Stark Klein, MD;  Location: [facility];  Service: General;  Laterality: N/A;    OB History    No data available       Home Medications    Prior to Admission medications   Medication Sig Start Date End Date Taking? Authorizing Provider  amLODipine (NORVASC) 10 MG tablet Take 5 mg by mouth every morning.    Yes [provider]  atorvastatin (LIPITOR) 40 MG tablet Take 40 mg by mouth every evening.   Yes [provider]  diphenhydrAMINE (BENADRYL) 25 mg capsule Take 25 mg by mouth every 6 (six) hours as needed for itching.   Yes [provider]  ferrous sulfate 325 (65 FE) MG tablet Take 325 mg by mouth daily with breakfast.   Yes [provider]  Insulin Glargine (LANTUS SOLOSTAR) 100 UNIT/ML Solostar Pen Inject 10 Units into the skin daily at 10 pm. Patient taking differently: Inject 18 Units into the  skin daily at 10 pm.  08/15/16  Yes Domenic Polite, MD  insulin lispro (HUMALOG) 100 UNIT/ML injection Inject 4-8 Units into the skin 2 (two) times daily as needed for high blood sugar (per sliding scale). Sliding scale    Yes [provider]  lidocaine-prilocaine (EMLA) cream Apply 1 application topically once.  08/08/14  Yes [provider]  loperamide (IMODIUM) 2 MG capsule Take 2 mg by mouth 3 (three) times daily as needed for diarrhea or loose stools.   Yes [provider]  ondansetron (ZOFRAN) 8 MG tablet Take  0.5-1 tablets (4-8 mg total) by mouth 2 (two) times daily. Take 1 hour prior to Lonsurf dose for nausea prophylaxis 09/17/16  Yes Truitt Merle, MD  traMADol (ULTRAM) 50 MG tablet Take 1 tablet (50 mg total) by mouth every 6 (six) hours as needed. Patient taking differently: Take 50 mg by mouth every 6 (six) hours as needed for moderate pain.  06/11/16  Yes Truitt Merle, MD  trifluridine-tipiracil (LONSURF) 20-8.19 MG tablet TAKE 2 TABLETS ('40MG'$ ) BY MOUTH TWICE DAILY,1 HOUR AFTER AM & PM MEALS ON DAYS 1-5, 8-12. REPEAT EVERY 28 DAYS 10/16/16  Yes Truitt Merle, MD    Family History Family History  Problem Relation Age of Onset  . COPD Father   . Heart attack Father   . Arthritis Sister   . Diabetes Sister   . Colon cancer Sister        dx over 56  . Colon polyps Sister   . Seizures Brother   . Colon cancer Sister        dx in her early 72s, died in her early to mid 53s  . Diabetes Mother     Social History Social History  Substance Use Topics  . Smoking status: Former Smoker    Packs/day: 1.00    Years: 6.00    Types: Cigarettes    Quit date: 01/21/1976  . Smokeless tobacco: Never Used  . Alcohol use No     Allergies   Patient has no known allergies.   Review of Systems Review of Systems  Constitutional: Negative for chills and fever.  Respiratory: Negative for cough and shortness of breath.   Cardiovascular: Negative for chest pain.  Gastrointestinal: Negative for abdominal pain, diarrhea, nausea and vomiting.  Genitourinary: Negative for dysuria and hematuria.  Skin: Positive for color change. Negative for rash.  Neurological: Positive for weakness (Generalized). Negative for dizziness and headaches.  Psychiatric/Behavioral: Negative for confusion.     Physical Exam Updated Vital Signs BP (!) 166/92 (BP Location: Right Arm)   Pulse 91   Temp 98.3 F (36.8 C) (Oral)   Resp 15   Ht '5\' 4"'$  (1.626 m)   Wt 62.1 kg (137 lb)   SpO2 100%   BMI 23.52 kg/m   Physical Exam    Constitutional: She is oriented to person, place, and time. She appears well-developed and well-nourished.  Frail appearing, resting comfortably on examination table  HENT:  Head: Normocephalic and atraumatic.  Mouth/Throat: Oropharynx is clear and moist. Mucous membranes are dry.  Eyes: Pupils are equal, round, and reactive to light. Conjunctivae, EOM and lids are normal. Scleral icterus is present.  Neck: Full passive range of motion without pain.  Cardiovascular: Normal rate, regular rhythm, normal heart sounds and normal pulses.  Exam reveals no gallop and no friction rub.   No murmur heard. Pulses:      Radial pulses are 2+ on the right side, and 2+  on the left side.  Pulmonary/Chest: Effort normal and breath sounds normal.  Port noted to the right anterior chest with no surrounding warmth or erythema.   Abdominal: Soft. Normal appearance. There is no tenderness. There is no rigidity and no guarding.  Musculoskeletal: Normal range of motion.  BLE are symmetric in appearance  Neurological: She is alert and oriented to person, place, and time. GCS eye subscore is 4. GCS verbal subscore is 5. GCS motor subscore is 6.  Skin: Skin is warm and dry. Capillary refill takes less than 2 seconds.  Diffuse jaundice   Psychiatric: She has a normal mood and affect. Her speech is normal.  Nursing note and vitals reviewed.    ED Treatments / Results  Labs (all labs ordered are listed, but only abnormal results are displayed) Labs Reviewed  COMPREHENSIVE METABOLIC PANEL - Abnormal; Notable for the following:       Result Value   Sodium 133 (*)    CO2 20 (*)    Glucose, Bld 158 (*)    BUN 45 (*)    Creatinine, Ser 1.97 (*)    Albumin 2.1 (*)    AST 218 (*)    ALT 83 (*)    Alkaline Phosphatase 514 (*)    Total Bilirubin 20.9 (*)    GFR calc non Af Amer 26 (*)    GFR calc Af Amer 30 (*)    All other components within normal limits  CBC - Abnormal; Notable for the following:    RBC  3.47 (*)    Hemoglobin 10.4 (*)    HCT 30.5 (*)    RDW 19.2 (*)    All other components within normal limits  URINALYSIS, ROUTINE W REFLEX MICROSCOPIC - Abnormal; Notable for the following:    Color, Urine AMBER (*)    APPearance CLOUDY (*)    Specific Gravity, Urine >1.030 (*)    Glucose, UA 250 (*)    Hgb urine dipstick SMALL (*)    Bilirubin Urine LARGE (*)    Protein, ur >300 (*)    All other components within normal limits  URINALYSIS, MICROSCOPIC (REFLEX) - Abnormal; Notable for the following:    Bacteria, UA MANY (*)    Squamous Epithelial / LPF 6-30 (*)    All other components within normal limits  URINE CULTURE  LIPASE, BLOOD    EKG  EKG Interpretation None       Radiology US Abdomen Limited Ruq  Result Date: 11/22/2016 CLINICAL DATA:  Jaundice. EXAM: ULTRASOUND ABDOMEN LIMITED RIGHT UPPER QUADRANT COMPARISON:  11/17/2016. PET-CT dated 09/09/2016 and abdomen and pelvis CTs dated 08/12/2016 and 03/28/2016. FINDINGS: Gallbladder: Again demonstrated is a large shadowing gallstone in the neck of the gallbladder measuring 2.5 cm in maximum diameter. Increased tumefactive sludge is also noted. Also demonstrated is gallbladder wall thickening with a maximum thickness of 6.3 mm. There is also some pericholecystic fluid. No sonographic Murphy sign. Common bile duct: Diameter: 6.3 mm Liver: The liver is heterogeneous with a slightly mass like appearance in the posterior aspect of the right lobe, measuring 6.3 x 5.8 x 4.7 cm. Suggestion of multiple additional poorly defined masses in the liver. The patient has known hepatic metastases with a history of colon cancer. Portal vein is patent on color Doppler imaging with normal direction of blood flow towards the liver. IMPRESSION: 1. 2.5 cm stone lodged in the neck of the gallbladder with increased tumefactive sludge, wall thickening and interval pericholecystic fluid. This combination of findings  is concerning for acute cholecystitis.  There was no sonographic Murphy sign however. 2. Stable borderline dilated common duct with no intrahepatic ductal dilatation. 3. Known metastatic liver disease. Electronically Signed   By: Claudie Revering M.D.   On: 11/22/2016 10:57    Procedures Procedures (including critical care time)  Medications Ordered in ED Medications  ondansetron (ZOFRAN-ODT) disintegrating tablet 4 mg (not administered)  lidocaine-prilocaine (EMLA) cream ( Topical Given 11/22/16 0730)  sodium chloride 0.9 % bolus 500 mL (500 mLs Intravenous New Bag/Given 11/22/16 7616)     Initial Impression / Assessment and Plan / ED Course  I have reviewed the triage vital signs and the nursing notes.  Pertinent labs & imaging results that were available during my care of the patient were reviewed by me and considered in my medical decision making (see chart for details).     61 year old female with past medical history metastatic colon cancer to the lungs and liver who presents today with generalized weakness, nausea and jaundice. Patient has been having these symptoms for the last several days but has been reports in the last 24 hours if significantly worsened. No history of the fever, chest pain, SOB. Patient is afebrile, non-toxic appearing, sitting comfortably on examination table. Vital signs reviewed and stable. Offered patient analgesics. She declines at this time.   Records reviewed. Patient had a right upper quadrant abdominal ultrasound done on 11/17/16. At that time, gallstone was shown in the neck of the gallbladder associated with some biliary sludge. Was concern for developing biliary obstruction and recommendation to correlate with MRCP. Family reports that patient was scheduled to see Dr. Burr Medico in 2 days for reevaluation. Patient had a PET scan on 09/09/16 which showed progressive bilateral pulmonary metastatic disease. Also showed improvement of multifocal hepatic metastatic disease but with some nodal metastasis within  the gastrohepatic ligament. Her lab work done on 11/14/16 showed a hemoglobin of 7.2 and hematocrit 21.9. This was prior to the infusion she received. Additional labs showed:  Total Bili: 8.48 Alk Phos: 732 AST: 118  ALT: 72  Labs reviewed. CBC shows H&H at 10.4/30.5, which is increased from 8 days ago before she had a transfusion. Lipase unremarkable. CMP shows BUN/creatinine at 45/1.97 which is an increase from previous labs a days ago. Patient also has a total bilirubin of 20.9 which is an elevation from the previous 8.48. AST is 218 which is slightly elevated from previous lab. UA shows small hemoglobin, leukocytes, bilirubin. Will plan to order a right upper quadrant ultrasound for evaluation of potential gallstone. Will likely require admission for further evaluation. Discussed with Dr. Ayesha Rumpf, who is agreeable to plan. 's with patient and family. They're agreeable to plan.  Discussed patient with Dr. Marin Olp (Oncology). Agrees with plan for admission. Will plan to consult during admission.   Discussed patient with Dr. Erlinda Hong (hospitalist). Recommends waiting for results of RUQ abd U/S before admission to see if Surg or IR needs to be consulted.    Right upper quadrant ultrasound reviewed. It shows a 2.5 cm gallstone in the neck of the gallbladder. There is increased sludge, pericholecystic fluid and gallbladder wall thickening compared to previous imaging. Will consult surgery.   Discussed patient with Dr. Zella Richer (Gen Surgery). After reviewing imaging and history, does not feel that patient is a surgical candidate at this time. Recommends medicine admission with IV antibiotics and pain control.   Discussed patient with Dr. Erlinda Hong (hospitalist). Will admit.    Final Clinical Impressions(s) / ED Diagnoses  Final diagnoses:  Jaundice  Cholecystitis    New Prescriptions New Prescriptions   No medications on file     Desma Mcgregor 11/22/16 [place], [place],  MD 11/24/16 [phone]

## 2016-11-22 NOTE — ED Notes (Signed)
Chaplain requested and called.

## 2016-11-22 NOTE — H&P (Addendum)
History and Physical  Michelle Erickson:[id number] DOB: 09-Mar-1955 DOA: 11/22/2016  Referring physician: EDP PCP: Dione Housekeeper, MD   Chief Complaint: Jaundice, nausea and vomiting, progressive weakness  HPI: Michelle Erickson is a 61 y.o. female   With history of hypertension, hyperlipidemia, insulin-dependent type 2 diabetes mellitus, metastatic colon cancer to liver presented to the emergency room due to above complaints.  She denies of fever no abdominal pain, no issue with bowel movement.  ED course: Vital signs stable, WBC 4.5, hemoglobin 10.4, sodium 133, potassium 5, BUN 45, creatinine 1.97.  She has markedly increased total bilirubin 20.9, as well as transaminitis and elevated alk phos. abdominal ultrasound was 2.5 cm stone lodged in the neck of gallbladder, wall thickening and interval pericholecystic fluid. This combination of findings is concerning for acute cholecystitis. There was no sonographic Murphy sign however. Stable borderline dilated common duct with no intrahepatic ductal dilatation. 3. Known metastatic liver disease. However patient has no fever, no leukocytosis, denies abdominal pain.  EDP discussed case with general surgery who recommended conservative management ,due to patient is not a good surgical candidate. Hospitalist called to admit the patient.   Review of Systems:  Detail per HPI, Review of systems are otherwise negative  Past Medical History:  Diagnosis Date  . Back pain   . Diabetes mellitus without complication ([place])   . Family history of colon cancer   . Hyperlipemia   . Hypertension   . Metastatic colon cancer to liver ([place]) 07/21/2014  . Snoring   . Wears glasses    contacts/glasses   Past Surgical History:  Procedure Laterality Date  . CATARACT EXTRACTION Bilateral   . MOUTH SURGERY    . PORTACATH PLACEMENT N/A 08/03/2014   Procedure: INSERTION PORT-A-CATH;  Surgeon: Stark Klein, MD;  Location: [facility];  Service: General;  Laterality: N/A;    Social History:  reports that she quit smoking about 40 years ago. Her smoking use included Cigarettes. She has a 6.00 pack-year smoking history. She has never used smokeless tobacco. She reports that she does not drink alcohol or use drugs. Patient lives at home & is able to participate in activities of daily living independently   No Known Allergies  Family History  Problem Relation Age of Onset  . COPD Father   . Heart attack Father   . Arthritis Sister   . Diabetes Sister   . Colon cancer Sister        dx over 14  . Colon polyps Sister   . Seizures Brother   . Colon cancer Sister        dx in her early 3s, died in her early to mid 34s  . Diabetes Mother       Prior to Admission medications   Medication Sig Start Date End Date Taking? Authorizing Provider  amLODipine (NORVASC) 10 MG tablet Take 5 mg by mouth every morning.    Yes [provider]  atorvastatin (LIPITOR) 40 MG tablet Take 40 mg by mouth every evening.   Yes [provider]  diphenhydrAMINE (BENADRYL) 25 mg capsule Take 25 mg by mouth every 6 (six) hours as needed for itching.   Yes [provider]  ferrous sulfate 325 (65 FE) MG tablet Take 325 mg by mouth daily with breakfast.   Yes [provider]  Insulin Glargine (LANTUS SOLOSTAR) 100 UNIT/ML Solostar Pen Inject 10 Units into the skin daily at 10 pm. Patient taking differently: Inject 18 Units into the skin  daily at 10 pm.  08/15/16  Yes Domenic Polite, MD  insulin lispro (HUMALOG) 100 UNIT/ML injection Inject 4-8 Units into the skin 2 (two) times daily as needed for high blood sugar (per sliding scale). Sliding scale    Yes [provider]  lidocaine-prilocaine (EMLA) cream Apply 1 application topically once.  08/08/14  Yes [provider]  loperamide (IMODIUM) 2 MG capsule Take 2 mg by mouth 3 (three) times daily as needed for diarrhea or loose stools.   Yes [provider]  ondansetron (ZOFRAN)  8 MG tablet Take 0.5-1 tablets (4-8 mg total) by mouth 2 (two) times daily. Take 1 hour prior to Lonsurf dose for nausea prophylaxis 09/17/16  Yes Truitt Merle, MD  traMADol (ULTRAM) 50 MG tablet Take 1 tablet (50 mg total) by mouth every 6 (six) hours as needed. Patient taking differently: Take 50 mg by mouth every 6 (six) hours as needed for moderate pain.  06/11/16  Yes Truitt Merle, MD  trifluridine-tipiracil (LONSURF) 20-8.19 MG tablet TAKE 2 TABLETS ('40MG'$ ) BY MOUTH TWICE DAILY,1 HOUR AFTER AM & PM MEALS ON DAYS 1-5, 8-12. REPEAT EVERY 28 DAYS 10/16/16  Yes Truitt Merle, MD    Physical Exam: BP (!) 152/76   Pulse 91   Temp 98.3 F (36.8 C) (Oral)   Resp (!) 22   Ht '5\' 4"'$  (1.626 m)   Wt 62.1 kg (137 lb)   SpO2 99%   BMI 23.52 kg/m   General:  Jaundice, weak Eyes: PERRL ENT: unremarkable Neck: supple, no JVD Cardiovascular: RRR Respiratory: CTABL Abdomen: soft/NT/ND, positive bowel sounds Skin: no rash Musculoskeletal:  No edema Psychiatric: flat affect, depressed mood, calm/cooperative Neurologic: no focal findings            Labs on Admission:  Basic Metabolic Panel:  Recent Labs Lab 11/22/16 0850  NA 133*  K 5.0  CL 103  CO2 20*  GLUCOSE 158*  BUN 45*  CREATININE 1.97*  CALCIUM 9.3   Liver Function Tests:  Recent Labs Lab 11/22/16 0850  AST 218*  ALT 83*  ALKPHOS 514*  BILITOT 20.9*  PROT 7.0  ALBUMIN 2.1*    Recent Labs Lab 11/22/16 0850  LIPASE 13   No results for input(s): AMMONIA in the last 168 hours. CBC:  Recent Labs Lab 11/22/16 0850  WBC 4.5  HGB 10.4*  HCT 30.5*  MCV 87.9  PLT 180   Cardiac Enzymes: No results for input(s): CKTOTAL, CKMB, CKMBINDEX, TROPONINI in the last 168 hours.  BNP (last 3 results) No results for input(s): BNP in the last 8760 hours.  ProBNP (last 3 results) No results for input(s): PROBNP in the last 8760 hours.  CBG: No results for input(s): GLUCAP in the last 168 hours.  Radiological Exams on  Admission: US Abdomen Limited Ruq  Result Date: 11/22/2016 CLINICAL DATA:  Jaundice. EXAM: ULTRASOUND ABDOMEN LIMITED RIGHT UPPER QUADRANT COMPARISON:  11/17/2016. PET-CT dated 09/09/2016 and abdomen and pelvis CTs dated 08/12/2016 and 03/28/2016. FINDINGS: Gallbladder: Again demonstrated is a large shadowing gallstone in the neck of the gallbladder measuring 2.5 cm in maximum diameter. Increased tumefactive sludge is also noted. Also demonstrated is gallbladder wall thickening with a maximum thickness of 6.3 mm. There is also some pericholecystic fluid. No sonographic Murphy sign. Common bile duct: Diameter: 6.3 mm Liver: The liver is heterogeneous with a slightly mass like appearance in the posterior aspect of the right lobe, measuring 6.3 x 5.8 x 4.7 cm. Suggestion of multiple additional poorly defined  masses in the liver. The patient has known hepatic metastases with a history of colon cancer. Portal vein is patent on color Doppler imaging with normal direction of blood flow towards the liver. IMPRESSION: 1. 2.5 cm stone lodged in the neck of the gallbladder with increased tumefactive sludge, wall thickening and interval pericholecystic fluid. This combination of findings is concerning for acute cholecystitis. There was no sonographic Murphy sign however. 2. Stable borderline dilated common duct with no intrahepatic ductal dilatation. 3. Known metastatic liver disease. Electronically Signed   By: Claudie Revering M.D.   On: 11/22/2016 10:57     Assessment/Plan Present on Admission: . Jaundice  Jaundice /nausea/ vomiting -From cancer progression versus infection versus obstruction - not a surgical candidate per general surgery - I discussed case with interventional radiology who recommended GI consult first -Case discussed with GI Dr. Almyra Free who will evaluate the patient -Oncology input appreciated, Dr. Marin Olp ordered MRCP -empirically start Rocephin, ivf, prn antiemetics, npo for  now.  Insulin-dependent type 2 diabetes Currently n.p.o. SSI and hypoglycemia protocol  CKD 3, seems progressively getting worse recently, renal dosing meds  DVT prophylaxis: lovenox  Consultants:  EDP discussed case with general surgery  Case discussed with interventional radiology Dr Barbie Banner who recommand GI consult first Case discussed with GI Dr Benson Norway who is going to formally consult oncology  Code Status: full   Family Communication:  Patient and husband  Disposition Plan: admit to med surg  Time spent: 82mns  Tiphani Mells MD, PhD [facility] Pager [phone] 7PM-7AM, please contact night-coverage at [email or web address], password [email or web address]

## 2016-11-22 NOTE — ED Triage Notes (Signed)
Patient has a hx of colon cancer. Patient is getting chemo. Patient is nauseated and skin has turned yellow. Patient is feeling sicker than normal.

## 2016-11-22 NOTE — ED Notes (Signed)
Chaplain Michelle Erickson has finished talking with pt and family.

## 2016-11-22 NOTE — Progress Notes (Signed)
Pt was lying in bed, awake and alert when I arrived. Her husband and daughter were bedside. Pt's daughter was very engaged in the conversation and husband was more reserved as well as pt. However, as we began dialogue about her faith background, pt became more engaged and agreeable. Her daughter was very supportive. Pt explained to me what she understood about information from the dr. Pt seemed to gain a full perspective as we talked about her faith working with her diagnosis and outlook regarding surgery. Her daughter is very open to surgery and whatever she needs; husband was supportive when asked. Pt was very appreciative of visit and confirmed she just needed to talk it out and seems open to surgery at the point of our visit. Please page if additional support is needed as offered to pt and family. [place], MDiv   11/22/16 1300  Clinical Encounter Type  Visited With Patient and family together

## 2016-11-22 NOTE — Consult Note (Signed)
Referral MD  Reason for Referral: Progressive hepatic failure secondary to metastatic colon cancer  Chief Complaint  Patient presents with  . Nausea  : I am getting more yellow and just cannot eat.  HPI: Michelle Erickson is a very charming 61 year old African-American female.  She is from [place], [place].  She is with her husband in the emergency room.  She has metastatic colon cancer.  She has been fighting this for a couple years.  She saw Dr.Feng last Monday.  She had a bilirubin of 8.  Dr. Annamaria Boots thought that this was from the [facility].  Lonsurf was stopped.  Unfortunately, as the week progressed she worsened.  She had more nausea.  She had weakness.  She had anorexia.  She became more jaundiced.  She came to the emergency room.  This was on 3 November.  Her bilirubin was 21.  Her albumin was 2.1.  Her creatinine was 1.97.  Potassium was 5.  Her white cell count was 4.5.  Hemoglobin 10.4 and platelet count 180.  I think she was transfused with blood on the 29th.  Her last CEA from October 26 was close to 2000.  Is been progressively elevated.  She just has no appetite.  She feels nauseated.  She has had no diarrhea.  She has had no bleeding.  She has had further weakness.  Overall, I would say her performance status is ECOG 3.   Past Medical History:  Diagnosis Date  . Back pain   . Diabetes mellitus without complication ([place])   . Family history of colon cancer   . Hyperlipemia   . Hypertension   . Metastatic colon cancer to liver ([place]) 07/21/2014  . Snoring   . Wears glasses    contacts/glasses  :  Past Surgical History:  Procedure Laterality Date  . CATARACT EXTRACTION Bilateral   . MOUTH SURGERY    . PORTACATH PLACEMENT N/A 08/03/2014   Procedure: INSERTION PORT-A-CATH;  Surgeon: Stark Klein, MD;  Location: [facility];  Service: General;  Laterality: N/A;  :   Current Facility-Administered Medications:  .  ondansetron (ZOFRAN-ODT) disintegrating tablet 4 mg, 4 mg,  Oral, Once PRN, Drenda Freeze, MD  Current Outpatient Prescriptions:  .  amLODipine (NORVASC) 10 MG tablet, Take 5 mg by mouth every morning. , Disp: , Rfl:  .  atorvastatin (LIPITOR) 40 MG tablet, Take 40 mg by mouth every evening., Disp: , Rfl:  .  diphenhydrAMINE (BENADRYL) 25 mg capsule, Take 25 mg by mouth every 6 (six) hours as needed for itching., Disp: , Rfl:  .  ferrous sulfate 325 (65 FE) MG tablet, Take 325 mg by mouth daily with breakfast., Disp: , Rfl:  .  Insulin Glargine (LANTUS SOLOSTAR) 100 UNIT/ML Solostar Pen, Inject 10 Units into the skin daily at 10 pm. (Patient taking differently: Inject 18 Units into the skin daily at 10 pm. ), Disp: , Rfl:  .  insulin lispro (HUMALOG) 100 UNIT/ML injection, Inject 4-8 Units into the skin 2 (two) times daily as needed for high blood sugar (per sliding scale). Sliding scale , Disp: , Rfl:  .  lidocaine-prilocaine (EMLA) cream, Apply 1 application topically once. , Disp: , Rfl:  .  loperamide (IMODIUM) 2 MG capsule, Take 2 mg by mouth 3 (three) times daily as needed for diarrhea or loose stools., Disp: , Rfl:  .  ondansetron (ZOFRAN) 8 MG tablet, Take 0.5-1 tablets (4-8 mg total) by mouth 2 (two) times daily. Take 1 hour prior  to Lonsurf dose for nausea prophylaxis, Disp: 30 tablet, Rfl: 3 .  traMADol (ULTRAM) 50 MG tablet, Take 1 tablet (50 mg total) by mouth every 6 (six) hours as needed. (Patient taking differently: Take 50 mg by mouth every 6 (six) hours as needed for moderate pain. ), Disp: 30 tablet, Rfl: 1 .  trifluridine-tipiracil (LONSURF) 20-8.19 MG tablet, TAKE 2 TABLETS (40MG ) BY MOUTH TWICE DAILY,1 HOUR AFTER AM & PM MEALS ON DAYS 1-5, 8-12. REPEAT EVERY 28 DAYS, Disp: 40 tablet, Rfl: 0  Facility-Administered Medications Ordered in Other Encounters:  .  sodium chloride 0.9 % injection 10 mL, 10 mL, Intravenous, PRN, Truitt Merle, MD, 10 mL at 05/14/16 0753:  :  No Known Allergies:  Family History  Problem Relation Age of  Onset  . COPD Father   . Heart attack Father   . Arthritis Sister   . Diabetes Sister   . Colon cancer Sister        dx over 69  . Colon polyps Sister   . Seizures Brother   . Colon cancer Sister        dx in her early 46s, died in her early to mid 25s  . Diabetes Mother   :  Social History   Social History  . Marital status: Married    Spouse name: Michelle Erickson  . Number of children: 2  . Years of education: N/A   Occupational History  . Not on file.   Social History Main Topics  . Smoking status: Former Smoker    Packs/day: 1.00    Years: 6.00    Types: Cigarettes    Quit date: 01/21/1976  . Smokeless tobacco: Never Used  . Alcohol use No  . Drug use: No  . Sexual activity: Yes    Birth control/ protection: Post-menopausal     Comment: # 2 pregnancies and #2 live births   Other Topics Concern  . Not on file   Social History Narrative   Married, husband Michelle Erickson   Has #2 adult children   Previously worked at [employer]     :  Pertinent items are noted in HPI.  Exam: Patient Vitals for the past 24 hrs:  BP Temp Temp src Pulse Resp SpO2 Height Weight  11/22/16 0538 135/87 98.3 F (36.8 C) Oral 90 18 98 % 5\' 4"  (1.626 m) 137 lb (62.1 kg)   As above   Recent Labs  11/22/16 0850  WBC 4.5  HGB 10.4*  HCT 30.5*  PLT 180    Recent Labs  11/22/16 0850  NA 133*  K 5.0  CL 103  CO2 20*  GLUCOSE 158*  BUN 45*  CREATININE 1.97*  CALCIUM 9.3    Blood smear review: None  Pathology: None    Assessment and Plan: Michelle Erickson is a very nice 61 year old African-American female.  She has progressive metastatic colon cancer.  I think it is, unfortunately, apparent that her disease is what I would consider refractory.  It does not sound like there really is going to be much hope in improving her hyperbilirubinemia.  I suppose that a CT scan or MRCP can be done.  Maybe, a stent or percutaneous drain could be placed to try to help with her bilirubin  blockage.  I suspect that she understands that her prognosis is very poor.  I do not talk to her about end-of-life issues as of yet.  I will probably defer that to Dr. Burr Medico, since she knows the patient  very well.  From my point of view, I think we have to focus on her comfort and quality of life.  I suppose we could consider an MRCP to see if there is any means of decompressing her.  She has a very strong faith.  She wanted me to pray with her.  I definitely did this.  It made me feel good to be able to pray with her.  I spent about 45 minutes with she and her husband.  I answered all of their questions.  She really did not ask too much.  Again I have a sense that she understands the problem that she is dealing with.  Lattie Haw, MD  Romans 5:3-5

## 2016-11-22 NOTE — Consult Note (Signed)
Consult Note for [facility]  Reason for Consult: Hyperbilirubinemia, cholelithiasis, metastatic colon cancer Referring Physician: [facility]  Ulice Dash HPI: This is a 61 year old female diagnosed with metastatic colon cancer by Dr. Delfin Edis on 07/25/2014.  A colonoscopy was performed and it revealed a sigmoid colon cancer and the CT scan in the ER showed hepatic mets.  Over the past two years she underwent chemotherapy, but her CEA has rapidly increased.  Her total bilirubin recently was in the 8 range and it was felt to be as a result of her chemotherapy, but now it is at 20.9.  Her last abdominal CT scan was in 03/2016 and there was no report of biliary ductal dilation, however, she had hepatic mets.  The last PET scan was on 09/09/2016 and there was progression of the disease in her lungs and new gastrohepatic lymph adenopathy.  He RUQ U/S performed on 11/17/2016 and 12/02/2016 both reveal a large 2.5 cm gallstone, hepatic mets, no intrahepatic biliary ductal dilation, and a minimally dilated CBD between 6-8 mm.  The most recent ultrasound does show the possibility of an acute cholecystitis.  Surgery has deemed her a poor candidate.  GI is asked to evaluate the patient to see if there is the possibility of an intervention to improve her hyperbilirubinemia.  Past Medical History:  Diagnosis Date  . Back pain   . Diabetes mellitus without complication ([place])   . Family history of colon cancer   . Hyperlipemia   . Hypertension   . Metastatic colon cancer to liver ([place]) 07/21/2014  . Snoring   . Wears glasses    contacts/glasses    Past Surgical History:  Procedure Laterality Date  . CATARACT EXTRACTION Bilateral   . MOUTH SURGERY    . PORTACATH PLACEMENT N/A 08/03/2014   Procedure: INSERTION PORT-A-CATH;  Surgeon: Stark Klein, MD;  Location: [facility];  Service: General;  Laterality: N/A;    Family History  Problem Relation Age of Onset  . COPD Father   . Heart attack Father   .  Arthritis Sister   . Diabetes Sister   . Colon cancer Sister        dx over 70  . Colon polyps Sister   . Seizures Brother   . Colon cancer Sister        dx in her early 65s, died in her early to mid 30s  . Diabetes Mother     Social History:  reports that she quit smoking about 40 years ago. Her smoking use included Cigarettes. She has a 6.00 pack-year smoking history. She has never used smokeless tobacco. She reports that she does not drink alcohol or use drugs.  Allergies: No Known Allergies  Medications:  Scheduled: . amLODipine  5 mg Oral q morning - 10a  . enoxaparin (LOVENOX) injection  30 mg Subcutaneous Q24H  . insulin aspart  0-9 Units Subcutaneous TID WC   Continuous: . sodium chloride 75 mL/hr at 11/22/16 1434  . cefTRIAXone (ROCEPHIN)  IV      Results for orders placed or performed during the hospital encounter of 11/22/16 (from the past 24 hour(s))  Urinalysis, Routine w reflex microscopic     Status: Abnormal   Collection Time: 11/22/16  6:47 AM  Result Value Ref Range   Color, Urine AMBER (A) YELLOW   APPearance CLOUDY (A) CLEAR   Specific Gravity, Urine >1.030 (H) 1.005 - 1.030   pH 5.0 5.0 - 8.0   Glucose, UA  250 (A) NEGATIVE mg/dL   Hgb urine dipstick SMALL (A) NEGATIVE   Bilirubin Urine LARGE (A) NEGATIVE   Ketones, ur NEGATIVE NEGATIVE mg/dL   Protein, ur >300 (A) NEGATIVE mg/dL   Nitrite NEGATIVE NEGATIVE   Leukocytes, UA NEGATIVE NEGATIVE  Urinalysis, Microscopic (reflex)     Status: Abnormal   Collection Time: 11/22/16  6:47 AM  Result Value Ref Range   RBC / HPF NONE SEEN 0 - 5 RBC/hpf   WBC, UA 0-5 0 - 5 WBC/hpf   Bacteria, UA MANY (A) NONE SEEN   Squamous Epithelial / LPF 6-30 (A) NONE SEEN   Granular Casts, UA PRESENT    Amorphous Crystal PRESENT   Lipase, blood     Status: None   Collection Time: 11/22/16  8:50 AM  Result Value Ref Range   Lipase 13 11 - 51 U/L  Comprehensive metabolic panel     Status: Abnormal   Collection Time:  11/22/16  8:50 AM  Result Value Ref Range   Sodium 133 (L) 135 - 145 mmol/L   Potassium 5.0 3.5 - 5.1 mmol/L   Chloride 103 101 - 111 mmol/L   CO2 20 (L) 22 - 32 mmol/L   Glucose, Bld 158 (H) 65 - 99 mg/dL   BUN 45 (H) 6 - 20 mg/dL   Creatinine, Ser 1.97 (H) 0.44 - 1.00 mg/dL   Calcium 9.3 8.9 - 10.3 mg/dL   Total Protein 7.0 6.5 - 8.1 g/dL   Albumin 2.1 (L) 3.5 - 5.0 g/dL   AST 218 (H) 15 - 41 U/L   ALT 83 (H) 14 - 54 U/L   Alkaline Phosphatase 514 (H) 38 - 126 U/L   Total Bilirubin 20.9 (HH) 0.3 - 1.2 mg/dL   GFR calc non Af Amer 26 (L) >60 mL/min   GFR calc Af Amer 30 (L) >60 mL/min   Anion gap 10 5 - 15  CBC     Status: Abnormal   Collection Time: 11/22/16  8:50 AM  Result Value Ref Range   WBC 4.5 4.0 - 10.5 K/uL   RBC 3.47 (L) 3.87 - 5.11 MIL/uL   Hemoglobin 10.4 (L) 12.0 - 15.0 g/dL   HCT 30.5 (L) 36.0 - 46.0 %   MCV 87.9 78.0 - 100.0 fL   MCH 30.0 26.0 - 34.0 pg   MCHC 34.1 30.0 - 36.0 g/dL   RDW 19.2 (H) 11.5 - 15.5 %   Platelets 180 150 - 400 K/uL     US Abdomen Limited Ruq  Result Date: 11/22/2016 CLINICAL DATA:  Jaundice. EXAM: ULTRASOUND ABDOMEN LIMITED RIGHT UPPER QUADRANT COMPARISON:  11/17/2016. PET-CT dated 09/09/2016 and abdomen and pelvis CTs dated 08/12/2016 and 03/28/2016. FINDINGS: Gallbladder: Again demonstrated is a large shadowing gallstone in the neck of the gallbladder measuring 2.5 cm in maximum diameter. Increased tumefactive sludge is also noted. Also demonstrated is gallbladder wall thickening with a maximum thickness of 6.3 mm. There is also some pericholecystic fluid. No sonographic Murphy sign. Common bile duct: Diameter: 6.3 mm Liver: The liver is heterogeneous with a slightly mass like appearance in the posterior aspect of the right lobe, measuring 6.3 x 5.8 x 4.7 cm. Suggestion of multiple additional poorly defined masses in the liver. The patient has known hepatic metastases with a history of colon cancer. Portal vein is patent on color Doppler  imaging with normal direction of blood flow towards the liver. IMPRESSION: 1. 2.5 cm stone lodged in the neck of the gallbladder with increased  tumefactive sludge, wall thickening and interval pericholecystic fluid. This combination of findings is concerning for acute cholecystitis. There was no sonographic Murphy sign however. 2. Stable borderline dilated common duct with no intrahepatic ductal dilatation. 3. Known metastatic liver disease. Electronically Signed   By: Claudie Revering M.D.   On: 11/22/2016 10:57    ROS:  As stated above in the HPI otherwise negative.  Blood pressure (!) 161/99, pulse 84, temperature (!) 100.4 F (38 C), temperature source Oral, resp. rate (!) 23, height 5\' 4"  (1.626 m), weight 62.1 kg (137 lb), SpO2 100 %.    PE: Gen: NAD, Alert and Oriented HEENT:  [place]/AT, EOMI Neck: Supple, no LAD Lungs: CTA Bilaterally CV: RRR without M/G/R ABM: Soft, NTND, +BS Ext: No C/C/E  Assessment/Plan: 1) Metastatic colon cancer. 2) Hyperbilirubinemia. 3) Cholelithiasis. 4) Cholecystitis.   There is a clear progression of her colon cancer with more mets to the liver.  Her CEA has markedly increased.  The CBD is borderline dilated and I do not feel that she has choledocholithiasis.  Her liver enzymes have been in an obstructive pattern for quite some time and it has worsened.  Most likely her marked increase in TB is as a result of her metastatic disease and a possible component from the acute cholecystitis.  From the GI perspective I do not have any interventions to offer her.  There is no stricture in the CBD to stent and no clear evidence of choledocholithiasis.  Plan: 1) Hospice care. 2) No further GI input at this time.  Signing off.  Oaklan Persons D 11/22/2016, 3:19 PM

## 2016-11-22 NOTE — ED Notes (Signed)
Date and time results received: 11/22/16 0959 (use smartphrase ".now" to insert current time)  Test: Bilirubin Critical Value: 20.9  Name of Provider Notified: Providence Lanius  Orders Received? Or Actions Taken?: Actions Taken: Notified PCP

## 2016-11-23 ENCOUNTER — Other Ambulatory Visit: Payer: Self-pay

## 2016-11-23 DIAGNOSIS — N390 Urinary tract infection, site not specified: Secondary | ICD-10-CM

## 2016-11-23 DIAGNOSIS — N189 Chronic kidney disease, unspecified: Secondary | ICD-10-CM | POA: Diagnosis present

## 2016-11-23 DIAGNOSIS — C787 Secondary malignant neoplasm of liver and intrahepatic bile duct: Secondary | ICD-10-CM

## 2016-11-23 DIAGNOSIS — C7800 Secondary malignant neoplasm of unspecified lung: Secondary | ICD-10-CM

## 2016-11-23 DIAGNOSIS — N179 Acute kidney failure, unspecified: Secondary | ICD-10-CM | POA: Diagnosis present

## 2016-11-23 DIAGNOSIS — K831 Obstruction of bile duct: Secondary | ICD-10-CM

## 2016-11-23 LAB — COMPREHENSIVE METABOLIC PANEL
ALK PHOS: 457 U/L — AB (ref 38–126)
ALT: 77 U/L — AB (ref 14–54)
ANION GAP: 13 (ref 5–15)
AST: 147 U/L — AB (ref 15–41)
Albumin: 1.9 g/dL — ABNORMAL LOW (ref 3.5–5.0)
BILIRUBIN TOTAL: 19.9 mg/dL — AB (ref 0.3–1.2)
BUN: 55 mg/dL — ABNORMAL HIGH (ref 6–20)
CALCIUM: 8.8 mg/dL — AB (ref 8.9–10.3)
CO2: 18 mmol/L — AB (ref 22–32)
CREATININE: 2.32 mg/dL — AB (ref 0.44–1.00)
Chloride: 105 mmol/L (ref 101–111)
GFR calc Af Amer: 25 mL/min — ABNORMAL LOW (ref >60)
GFR calc non Af Amer: 21 mL/min — ABNORMAL LOW (ref >60)
Glucose, Bld: 118 mg/dL — ABNORMAL HIGH (ref 65–99)
Potassium: 5.1 mmol/L (ref 3.5–5.1)
SODIUM: 136 mmol/L (ref 135–145)
Total Protein: 6.6 g/dL (ref 6.5–8.1)

## 2016-11-23 LAB — GLUCOSE, CAPILLARY
GLUCOSE-CAPILLARY: 128 mg/dL — AB (ref 65–99)
Glucose-Capillary: 109 mg/dL — ABNORMAL HIGH (ref 65–99)
Glucose-Capillary: 225 mg/dL — ABNORMAL HIGH (ref 65–99)
Glucose-Capillary: 253 mg/dL — ABNORMAL HIGH (ref 65–99)

## 2016-11-23 LAB — MAGNESIUM: Magnesium: 1.7 mg/dL (ref 1.7–2.4)

## 2016-11-23 LAB — CBC
HCT: 28.8 % — ABNORMAL LOW (ref 36.0–46.0)
Hemoglobin: 9.7 g/dL — ABNORMAL LOW (ref 12.0–15.0)
MCH: 29.8 pg (ref 26.0–34.0)
MCHC: 33.7 g/dL (ref 30.0–36.0)
MCV: 88.3 fL (ref 78.0–100.0)
Platelets: 187 10*3/uL (ref 150–400)
RBC: 3.26 MIL/uL — AB (ref 3.87–5.11)
RDW: 19.6 % — AB (ref 11.5–15.5)
WBC: 5.5 10*3/uL (ref 4.0–10.5)

## 2016-11-23 LAB — PHOSPHORUS: PHOSPHORUS: 5.5 mg/dL — AB (ref 2.5–4.6)

## 2016-11-23 NOTE — Progress Notes (Signed)
Michelle Erickson looks and feels a little bit better this morning.  She had her MRCP yesterday.  I do not have the results back.  Her bilirubin has not gone up.  Her bilirubin is 19.9.  Her albumin is at 1.9.  Her creatinine is up to 2.32.  Her CBC showed a white cell count of 5.5.  Hemoglobin 9.7 platelet count 187,000.  She is hungry.  She does not have nausea.  I do not see a reason why she cannot eat today.  It looks like she may have a urinary tract infection.  Cultures are pending.  She is on Rocephin.  She did have a low-grade temperature yesterday of 100.4.  Her blood pressure is 143/77.  Her head exam shows no oral lesions.  She has marked scleral icterus.  Lungs are clear bilaterally.  Cardiac exam regular rate and rhythm.  Abdomen is soft.  Bowel sounds might be slightly decreased.  There is no fluid wave.  There is no palpable liver or spleen tip.  Extremities shows no clubbing, cyanosis or  Michelle Erickson has metastatic colon cancer.  She has biliary obstruction.  We will see if there is anything that can be done with this with respect to stenting her.  My impression is that there probably is nothing that can be stented so she can drain.  I am glad that she is hungry.  I do not see a problem with her eating today.  I do not see why she needs this peripheral IV in the left hand.  We will go ahead and get this taken out since she has a Port-A-Cath.  I very much appreciate everybody's help on 5 W.  As always, the nurses do a fantastic job.  She wanted me to pray with her.  We had a very good prayer session.  Lattie Haw, MD  Psalm [phone]

## 2016-11-23 NOTE — Progress Notes (Addendum)
[facility] PROGRESS NOTE  Michelle Erickson WGN:[id number] DOB: 01-03-1956 DOA: 11/22/2016 PCP: Dione Housekeeper, MD  Assessment/Plan:  Jaundice /nausea/ vomiting -From cancer progression versus infection versus obstruction - not a surgical candidate per general surgery -Case discussed with GI Dr. Almyra Free who feels they have nothing to offer -Oncology input appreciated, Dr. Marin Olp ordered MRCP -empirically start Rocephin, ivf, prn antiemetics, npo -- will cont - Case discussed prev with interventional radiology Dr Barbie Banner who recommand GI consult first   Insulin-dependent type 2 diabetes Currently n.p.o. SSI and hypoglycemia protocol  AKI/CKD 3, seems progressively getting worse recently, renal dosing meds Could be due to N/V MRI abd pending Strict I&Os Check Mg and Phos PVR ordered  DVT prophylaxis: lovenox  Code Status: full   Family Communication:  Patient and husband  Disposition Plan: TBD    Consultants:  GEN surg (signed off); Onc following  Procedures:  MRCP pending  Antibiotics:  Rocephin 11/3--> Present (indicate start date, and stop date if known)  HPI/Subjective: Patient states she is doing well.  No complaints.  Objective: Vitals:   11/22/16 2221 11/23/16 0619  BP: 136/69 (!) 143/77  Pulse: (!) 101 75  Resp: 18 18  Temp: 98.4 F (36.9 C) 98.1 F (36.7 C)  SpO2: 97% 98%    Intake/Output Summary (Last 24 hours) at 11/23/2016 1007 Last data filed at 11/23/2016 0620 Gross per 24 hour  Intake 1207.5 ml  Output 0 ml  Net 1207.5 ml   Filed Weights   11/22/16 0538  Weight: 62.1 kg (137 lb)    Exam:   General:  NCAT, Jaundice, NAD  Cardiovascular: RRR, no MRG  Respiratory: CTAB, nl wob  Abdomen: NT, ND, BS+  Musculoskeletal: moving all extr, nl tone  Data Reviewed: Basic Metabolic Panel: Recent Labs  Lab 11/22/16 0850 11/23/16 0521  NA 133* 136  K 5.0 5.1  CL 103 105  CO2 20* 18*  GLUCOSE 158* 118*  BUN 45* 55*   CREATININE 1.97* 2.32*  CALCIUM 9.3 8.8*   Liver Function Tests: Recent Labs  Lab 11/22/16 0850 11/23/16 0521  AST 218* 147*  ALT 83* 77*  ALKPHOS 514* 457*  BILITOT 20.9* 19.9*  PROT 7.0 6.6  ALBUMIN 2.1* 1.9*   Recent Labs  Lab 11/22/16 0850  LIPASE 13   No results for input(s): AMMONIA in the last 168 hours. CBC: Recent Labs  Lab 11/22/16 0850 11/23/16 0521  WBC 4.5 5.5  HGB 10.4* 9.7*  HCT 30.5* 28.8*  MCV 87.9 88.3  PLT 180 187   Cardiac Enzymes: No results for input(s): CKTOTAL, CKMB, CKMBINDEX, TROPONINI in the last 168 hours. BNP (last 3 results) No results for input(s): BNP in the last 8760 hours.  ProBNP (last 3 results) No results for input(s): PROBNP in the last 8760 hours.  CBG: Recent Labs  Lab 11/22/16 1745 11/22/16 2210 11/23/16 0805  GLUCAP 117* 104* 109*    Recent Results (from the past 240 hour(s))  TECHNOLOGIST REVIEW     Status: None   Collection Time: 11/14/16  2:49 PM  Result Value Ref Range Status   Technologist Review Rare metamyelocyte and nRBC present  Final     Studies: US Abdomen Limited Ruq  Result Date: 11/22/2016 CLINICAL DATA:  Jaundice. EXAM: ULTRASOUND ABDOMEN LIMITED RIGHT UPPER QUADRANT COMPARISON:  11/17/2016. PET-CT dated 09/09/2016 and abdomen and pelvis CTs dated 08/12/2016 and 03/28/2016. FINDINGS: Gallbladder: Again demonstrated is a large shadowing gallstone in the neck of the gallbladder measuring 2.5 cm in  maximum diameter. Increased tumefactive sludge is also noted. Also demonstrated is gallbladder wall thickening with a maximum thickness of 6.3 mm. There is also some pericholecystic fluid. No sonographic Murphy sign. Common bile duct: Diameter: 6.3 mm Liver: The liver is heterogeneous with a slightly mass like appearance in the posterior aspect of the right lobe, measuring 6.3 x 5.8 x 4.7 cm. Suggestion of multiple additional poorly defined masses in the liver. The patient has known hepatic metastases with a  history of colon cancer. Portal vein is patent on color Doppler imaging with normal direction of blood flow towards the liver. IMPRESSION: 1. 2.5 cm stone lodged in the neck of the gallbladder with increased tumefactive sludge, wall thickening and interval pericholecystic fluid. This combination of findings is concerning for acute cholecystitis. There was no sonographic Murphy sign however. 2. Stable borderline dilated common duct with no intrahepatic ductal dilatation. 3. Known metastatic liver disease. Electronically Signed   By: Claudie Revering M.D.   On: 11/22/2016 10:57    Scheduled Meds: . amLODipine  5 mg Oral q morning - 10a  . enoxaparin (LOVENOX) injection  30 mg Subcutaneous Q24H  . insulin aspart  0-9 Units Subcutaneous TID WC  . sodium chloride flush  10-40 mL Intracatheter Q12H   Continuous Infusions: . sodium chloride 75 mL/hr at 11/23/16 0817  . cefTRIAXone (ROCEPHIN)  IV 2 g (11/22/16 2318)    Active Problems:   Jaundice    Time spent: [facility] Pager [place]. If 7PM-7AM, please contact night-coverage at [email or web address], password [facility] 11/23/2016, 10:07 AM  LOS: 1 day

## 2016-11-23 NOTE — Progress Notes (Addendum)
Pt voided 150cc. Post void residual showed 380cc. MD notfied and order for I an O cath. I an O cath done but only 25cc removed. Bladder scan then showed 250cc. MD notfied. Order for foley given but pt refused and wants to wait until the am to speak with her doctor.

## 2016-11-24 ENCOUNTER — Ambulatory Visit: Payer: BLUE CROSS/BLUE SHIELD | Admitting: Hematology

## 2016-11-24 ENCOUNTER — Other Ambulatory Visit: Payer: BLUE CROSS/BLUE SHIELD

## 2016-11-24 DIAGNOSIS — C189 Malignant neoplasm of colon, unspecified: Secondary | ICD-10-CM

## 2016-11-24 DIAGNOSIS — E119 Type 2 diabetes mellitus without complications: Secondary | ICD-10-CM

## 2016-11-24 DIAGNOSIS — N189 Chronic kidney disease, unspecified: Secondary | ICD-10-CM

## 2016-11-24 DIAGNOSIS — I10 Essential (primary) hypertension: Secondary | ICD-10-CM

## 2016-11-24 DIAGNOSIS — N179 Acute kidney failure, unspecified: Secondary | ICD-10-CM

## 2016-11-24 DIAGNOSIS — R17 Unspecified jaundice: Secondary | ICD-10-CM

## 2016-11-24 DIAGNOSIS — R112 Nausea with vomiting, unspecified: Secondary | ICD-10-CM

## 2016-11-24 LAB — CBC WITH DIFFERENTIAL/PLATELET
BASOS PCT: 0 %
Basophils Absolute: 0 10*3/uL (ref 0.0–0.1)
EOS ABS: 0 10*3/uL (ref 0.0–0.7)
Eosinophils Relative: 0 %
HCT: 28.6 % — ABNORMAL LOW (ref 36.0–46.0)
HEMOGLOBIN: 9.5 g/dL — AB (ref 12.0–15.0)
LYMPHS PCT: 12 %
Lymphs Abs: 0.6 10*3/uL — ABNORMAL LOW (ref 0.7–4.0)
MCH: 29.3 pg (ref 26.0–34.0)
MCHC: 33.2 g/dL (ref 30.0–36.0)
MCV: 88.3 fL (ref 78.0–100.0)
Monocytes Absolute: 0.9 10*3/uL (ref 0.1–1.0)
Monocytes Relative: 18 %
NEUTROS PCT: 70 %
Neutro Abs: 3.6 10*3/uL (ref 1.7–7.7)
Platelets: 184 10*3/uL (ref 150–400)
RBC: 3.24 MIL/uL — AB (ref 3.87–5.11)
RDW: 19.4 % — ABNORMAL HIGH (ref 11.5–15.5)
WBC: 5.1 10*3/uL (ref 4.0–10.5)

## 2016-11-24 LAB — COMPREHENSIVE METABOLIC PANEL
ALT: 67 U/L — ABNORMAL HIGH (ref 14–54)
ANION GAP: 12 (ref 5–15)
AST: 97 U/L — ABNORMAL HIGH (ref 15–41)
Albumin: 1.8 g/dL — ABNORMAL LOW (ref 3.5–5.0)
Alkaline Phosphatase: 455 U/L — ABNORMAL HIGH (ref 38–126)
BUN: 64 mg/dL — ABNORMAL HIGH (ref 6–20)
CALCIUM: 8.8 mg/dL — AB (ref 8.9–10.3)
CHLORIDE: 107 mmol/L (ref 101–111)
CO2: 17 mmol/L — AB (ref 22–32)
Creatinine, Ser: 2.78 mg/dL — ABNORMAL HIGH (ref 0.44–1.00)
GFR calc non Af Amer: 17 mL/min — ABNORMAL LOW (ref >60)
GFR, EST AFRICAN AMERICAN: 20 mL/min — AB (ref >60)
Glucose, Bld: 131 mg/dL — ABNORMAL HIGH (ref 65–99)
Potassium: 4.4 mmol/L (ref 3.5–5.1)
SODIUM: 136 mmol/L (ref 135–145)
Total Bilirubin: 19.1 mg/dL (ref 0.3–1.2)
Total Protein: 6.4 g/dL — ABNORMAL LOW (ref 6.5–8.1)

## 2016-11-24 LAB — GLUCOSE, CAPILLARY
GLUCOSE-CAPILLARY: 104 mg/dL — AB (ref 65–99)
GLUCOSE-CAPILLARY: 81 mg/dL (ref 65–99)
Glucose-Capillary: 108 mg/dL — ABNORMAL HIGH (ref 65–99)
Glucose-Capillary: 168 mg/dL — ABNORMAL HIGH (ref 65–99)

## 2016-11-24 NOTE — Progress Notes (Addendum)
PROGRESS NOTE    Michelle Erickson  BDZ:[id number] DOB: November 07, 1955 DOA: 11/22/2016 PCP: Dione Housekeeper, MD   Brief Narrative: 61 -year-old female with metastatic colon cancer following with oncology, chronic kidney disease is stage III presented with worsening jaundice, nausea and vomiting. Seen by oncology and recommended hospice care and palliative care consult.  Assessment & Plan:   Active Problems:   Jaundice   Acute on chronic renal failure (HCC)   Metastatic colon cancer in female [facility])  Type 2 diabetes: Continue current insulin regimen. Monitor blood sugar level.  -MRCP with severe progression of malignancy and metastatic disease. No further intervention per general surgery, GI. Oncologist recommended hospice care and palliative care. Palliative care consult requested and discussed with the team. Patient with progression of disease and worsening renal failure. I discussed this finding with the patient and her husband at bedside. Also discussed the goals of care. They like to have more time to think about it and willing to talk to palliative care. For now continue supportive care and current management including IV fluids and antibiotics. Pt and family agreed not pursuing invasive test including lab.  Very poor prognosis.  DVT prophylaxis: Lovenox subcutaneous Code Status: Full code Family Communication: Discussed with the patient's husband at bedside Disposition Plan: Currently admitted    Consultants:     Procedures: MRCP Antimicrobials: Ceftriaxone since November 3 Subjective: Seen and examined at bedside. No nausea vomiting or abdominal pain. Just discussed with oncologist  Objective: Vitals:   11/23/16 1233 11/23/16 2130 11/24/16 0535 11/24/16 0953  BP: (!) 158/81 133/77 136/79 (!) 144/80  Pulse: 83 86 (!) 55 85  Resp: 16 16 18    Temp: 98.6 F (37 C) 98.9 F (37.2 C) 98.3 F (36.8 C)   TempSrc: Oral Oral Oral   SpO2: 99% 97% 96%   Weight:      Height:         Intake/Output Summary (Last 24 hours) at 11/24/2016 1115 Last data filed at 11/24/2016 1032 Gross per 24 hour  Intake 1790 ml  Output 680 ml  Net 1110 ml   Filed Weights   11/22/16 0538  Weight: 62.1 kg (137 lb)    Examination:  General exam: Appears calm and comfortable Eye: icterus +++ Respiratory system: Clear to auscultation. Respiratory effort normal. No wheezing or crackle Cardiovascular system: S1 & S2 heard, RRR.  No pedal edema. Gastrointestinal system: Abdomen is firm, mild distention. Positive bowel sound Central nervous system: Alert and oriented. No focal neurological deficits. Extremities: Symmetric 5 x 5 power. Psychiatry: Judgement and insight appear normal. Mood & affect appropriate.     Data Reviewed: I have personally reviewed following labs and imaging studies  CBC: Recent Labs  Lab 11/22/16 0850 11/23/16 0521 11/24/16 0337  WBC 4.5 5.5 5.1  NEUTROABS  --   --  3.6  HGB 10.4* 9.7* 9.5*  HCT 30.5* 28.8* 28.6*  MCV 87.9 88.3 88.3  PLT 180 187 341   Basic Metabolic Panel: Recent Labs  Lab 11/22/16 0850 11/23/16 0521 11/24/16 0337  NA 133* 136 136  K 5.0 5.1 4.4  CL 103 105 107  CO2 20* 18* 17*  GLUCOSE 158* 118* 131*  BUN 45* 55* 64*  CREATININE 1.97* 2.32* 2.78*  CALCIUM 9.3 8.8* 8.8*  MG  --  1.7  --   PHOS  --  5.5*  --    GFR: Estimated Creatinine Clearance: 17.9 mL/min (A) (by C-G formula based on SCr of 2.78 mg/dL (H)). Liver  Function Tests: Recent Labs  Lab 11/22/16 0850 11/23/16 0521 11/24/16 0337  AST 218* 147* 97*  ALT 83* 77* 67*  ALKPHOS 514* 457* 455*  BILITOT 20.9* 19.9* 19.1*  PROT 7.0 6.6 6.4*  ALBUMIN 2.1* 1.9* 1.8*   Recent Labs  Lab 11/22/16 0850  LIPASE 13   No results for input(s): AMMONIA in the last 168 hours. Coagulation Profile: No results for input(s): INR, PROTIME in the last 168 hours. Cardiac Enzymes: No results for input(s): CKTOTAL, CKMB, CKMBINDEX, TROPONINI in the last 168  hours. BNP (last 3 results) No results for input(s): PROBNP in the last 8760 hours. HbA1C: No results for input(s): HGBA1C in the last 72 hours. CBG: Recent Labs  Lab 11/23/16 0805 11/23/16 1149 11/23/16 1703 11/23/16 2156 11/24/16 0739  GLUCAP 109* 225* 253* 128* 108*   Lipid Profile: No results for input(s): CHOL, HDL, LDLCALC, TRIG, CHOLHDL, LDLDIRECT in the last 72 hours. Thyroid Function Tests: No results for input(s): TSH, T4TOTAL, FREET4, T3FREE, THYROIDAB in the last 72 hours. Anemia Panel: No results for input(s): VITAMINB12, FOLATE, FERRITIN, TIBC, IRON, RETICCTPCT in the last 72 hours. Sepsis Labs: No results for input(s): PROCALCITON, LATICACIDVEN in the last 168 hours.  Recent Results (from the past 240 hour(s))  TECHNOLOGIST REVIEW     Status: None   Collection Time: 11/14/16  2:49 PM  Result Value Ref Range Status   Technologist Review Rare metamyelocyte and nRBC present  Final         Radiology Studies: Mr Abdomen With Mrcp W Contrast  Result Date: 11/23/2016 CLINICAL DATA:  Metastatic colon cancer. EXAM: MRI ABDOMEN WITHOUT CONTRAST  (INCLUDING MRCP) TECHNIQUE: Multiplanar multisequence MR imaging of the abdomen was performed. Heavily T2-weighted images of the biliary and pancreatic ducts were obtained, and three-dimensional MRCP images were rendered by post processing. COMPARISON:  Multiple exams, including 09/09/2016 PET-CT 11/22/2016 FINDINGS: Lower chest: Metastatic pulmonary nodules in the lung bases similar to prior. A left lower lobe nodule measures 2.0 cm transversely, formerly 1.9 cm by my measurement. Dependent atelectasis in both lower lobes. Trace left pleural effusion. Hepatobiliary: Innumerable metastatic lesions throughout the liver, dramatically increased tumor burden compared prior PET-CT. Multiple gallstones are present in the gallbladder, in addition to a prominently enlarged gallstone measuring 2.4 cm in diameter. Mild diffuse gallbladder  wall thickening. There is intrahepatic biliary dilatation in the right hepatic lobe but not the left. In the vicinity of the confluence of the right and left hepatic ducts there is occlusion of the biliary tree with extensive tumor nodularity in this vicinity likely causing extrinsic compression although true invasion of the biliary tree is certainly not excluded given the degree of confluent tumor. The stenosis appears to involve both the confluence of the right and left hepatic ducts but also the central right hepatic ducts, with the ductal tree from segments 5 and 8 not appreciably connecting to the ductal tree from segments six and seven. The portal vein remains patent although branches of the portal vein in the right hepatic lobe per attenuated by tumor. The distal portion of the common hepatic duct and the common bile duct appear patent and of normal caliber. Pancreas: There is extensive extrahepatic tumor in the porta hepatis, considerably increased from prior, potentially invading the pancreatic body from above, and abutting the right margin of the pancreatic head in the pancreaticoduodenal groove. This may represent infiltrative extension from the adenopathy. This surrounds portions of the portal vein. There is no dorsal pancreatic duct dilatation at this  time. Spleen:  Unremarkable Adrenals/Urinary Tract: Bilateral thickened adrenal glands, stable, without overt mass. No renal mass identified. Stomach/Bowel: Wall thickening in portions of the ascending and transverse colon may simply be due to peristaltic activity but is nonspecific. Vascular/Lymphatic: In addition to the extensive somewhat infiltrative adenopathy in the porta hepatis, there is new pathologic retroperitoneal adenopathy. A left periaortic node measures 1.5 cm on image 34/3. Another left periaortic node measures 1.2 cm on image 41/ 3. Multiple additional abnormal retroperitoneal lymph nodes are present. Celiac trunk and its branches extend  through malignant adenopathy in the gastrohepatic ligament/right gastric chain. Other:  Moderate scattered ascites. Musculoskeletal: Small T2 hyperintense lesions in the T10 and T12 vertebra are technically nonspecific. IMPRESSION: 1. Severe progression of malignancy with tumor now replacing much of the liver, bilateral malignant pulmonary nodules, and extensive increase in and adenopathy in the porta hepatis and also in the retroperitoneum. 2. 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 There is no was to know for certain whether this could be crossed with a wire without trying it. Based solely on the imaging, I am skeptical of successful traversed meant of the approximately 2 cm region of occlusion. Although the proximal left hepatic duct likewise appears severely narrowed, there is no ductal dilatation in the left hepatic lobe and accordingly it is managing to drain. 3. The infiltrative tumor in the porta hepatis abuts the portal vein but does not occlude the portal vein. There is some narrowing of portal vein tributaries in the right hepatic lobe due to extensive tumor. 4. Cholelithiasis.  Gallbladder wall thickening is nonspecific. 5. Wall thickening in portions of the ascending and transverse colon may be incidental. 6. Moderate scattered ascites. 7. Small T2 hyperintense lesions in the T12 and T10 vertebra are technically nonspecific in could be hemangiomas or early malignant lesions. Electronically Signed   By: Van Clines M.D.   On: 11/23/2016 09:32        Scheduled Meds: . amLODipine  5 mg Oral q morning - 10a  . enoxaparin (LOVENOX) injection  30 mg Subcutaneous Q24H  . insulin aspart  0-9 Units Subcutaneous TID WC  . sodium  chloride flush  10-40 mL Intracatheter Q12H   Continuous Infusions: . sodium chloride 75 mL/hr at 11/23/16 2152  . cefTRIAXone (ROCEPHIN)  IV Stopped (11/23/16 2222)     LOS: 2 days    Dron Tanna Furry, MD [facility] Pager [phone]  If 7PM-7AM, please contact night-coverage [email or web address] Password [email or web address] 11/24/2016, 11:15 AM

## 2016-11-24 NOTE — Progress Notes (Signed)
Michelle Erickson   DOB:15-Aug-1963   ZO#:[id number]   JWJ#:[id number]  Oncology follow-up  Subjective: Patient is well-known to me, under my care for her metastatic colon cancer.  I saw her last week, she was admitted for worsening jaundice, nausea and vomiting.   Objective:  Vitals:   11/23/16 2130 11/24/16 0535  BP: 133/77 136/79  Pulse: 86 (!) 55  Resp: 16 18  Temp: 98.9 F (37.2 C) 98.3 F (36.8 C)  SpO2: 97% 96%    Body mass index is 23.52 kg/m.  Intake/Output Summary (Last 24 hours) at 11/24/2016 0902 Last data filed at 11/24/2016 0759 Gross per 24 hour  Intake 2090 ml  Output 680 ml  Net 1410 ml     (+) Jaundice  Oropharynx clear  No peripheral adenopathy  Lungs clear -- no rales or rhonchi  Heart regular rate and rhythm  Abdomen soft, mild tenderness.  MSK no focal spinal tenderness, no peripheral edema  Neuro nonfocal   CBG (last 3)  Recent Labs    11/23/16 1703 11/23/16 2156 11/24/16 0739  GLUCAP 253* 128* 108*     Labs:  Lab Results  Component Value Date   WBC 5.1 11/24/2016   HGB 9.5 (L) 11/24/2016   HCT 28.6 (L) 11/24/2016   MCV 88.3 11/24/2016   PLT 184 11/24/2016   NEUTROABS 3.6 11/24/2016    CMP Latest Ref Rng & Units 11/24/2016 11/23/2016 11/22/2016  Glucose 65 - 99 mg/dL 131(H) 118(H) 158(H)  BUN 6 - 20 mg/dL 64(H) 55(H) 45(H)  Creatinine 0.44 - 1.00 mg/dL 2.78(H) 2.32(H) 1.97(H)  Sodium 135 - 145 mmol/L 136 136 133(L)  Potassium 3.5 - 5.1 mmol/L 4.4 5.1 5.0  Chloride 101 - 111 mmol/L 107 105 103  CO2 22 - 32 mmol/L 17(L) 18(L) 20(L)  Calcium 8.9 - 10.3 mg/dL 8.8(L) 8.8(L) 9.3  Total Protein 6.5 - 8.1 g/dL 6.4(L) 6.6 7.0  Total Bilirubin 0.3 - 1.2 mg/dL 19.1(HH) 19.9(HH) 20.9(HH)  Alkaline Phos 38 - 126 U/L 455(H) 457(H) 514(H)  AST 15 - 41 U/L 97(H) 147(H) 218(H)  ALT 14 - 54 U/L 67(H) 77(H) 83(H)     Urine Studies No results for input(s): UHGB, CRYS in the last 72 hours.  Invalid input(s): UACOL, UAPR, USPG, UPH, UTP, UGL, UKET,  UBIL, UNIT, UROB, ULEU, UEPI, UWBC, [place], [place], [place], [place], [place]  Basic Metabolic Panel: Recent Labs  Lab 11/22/16 0850 11/23/16 0521 11/24/16 0337  NA 133* 136 136  K 5.0 5.1 4.4  CL 103 105 107  CO2 20* 18* 17*  GLUCOSE 158* 118* 131*  BUN 45* 55* 64*  CREATININE 1.97* 2.32* 2.78*  CALCIUM 9.3 8.8* 8.8*  MG  --  1.7  --   PHOS  --  5.5*  --    GFR Estimated Creatinine Clearance: 17.9 mL/min (A) (by C-G formula based on SCr of 2.78 mg/dL (H)). Liver Function Tests: Recent Labs  Lab 11/22/16 0850 11/23/16 0521 11/24/16 0337  AST 218* 147* 97*  ALT 83* 77* 67*  ALKPHOS 514* 457* 455*  BILITOT 20.9* 19.9* 19.1*  PROT 7.0 6.6 6.4*  ALBUMIN 2.1* 1.9* 1.8*   Recent Labs  Lab 11/22/16 0850  LIPASE 13   No results for input(s): AMMONIA in the last 168 hours. Coagulation profile No results for input(s): INR, PROTIME in the last 168 hours.  CBC: Recent Labs  Lab 11/22/16 0850 11/23/16 0521 11/24/16 0337  WBC 4.5 5.5 5.1  NEUTROABS  --   --  3.6  HGB 10.4* 9.7* 9.5*  HCT 30.5* 28.8* 28.6*  MCV 87.9 88.3 88.3  PLT 180 187 184   Cardiac Enzymes: No results for input(s): CKTOTAL, CKMB, CKMBINDEX, TROPONINI in the last 168 hours. BNP: Invalid input(s): POCBNP CBG: Recent Labs  Lab 11/23/16 0805 11/23/16 1149 11/23/16 1703 11/23/16 2156 11/24/16 0739  GLUCAP 109* 225* 253* 128* 108*   D-Dimer No results for input(s): DDIMER in the last 72 hours. Hgb A1c No results for input(s): HGBA1C in the last 72 hours. Lipid Profile No results for input(s): CHOL, HDL, LDLCALC, TRIG, CHOLHDL, LDLDIRECT in the last 72 hours. Thyroid function studies No results for input(s): TSH, T4TOTAL, T3FREE, THYROIDAB in the last 72 hours.  Invalid input(s): FREET3 Anemia work up No results for input(s): VITAMINB12, FOLATE, FERRITIN, TIBC, IRON, RETICCTPCT in the last 72 hours. Microbiology Recent Results (from the past 240 hour(s))  TECHNOLOGIST REVIEW     Status: None    Collection Time: 11/14/16  2:49 PM  Result Value Ref Range Status   Technologist Review Rare metamyelocyte and nRBC present  Final      Studies:  Mr Abdomen With Mrcp W Contrast  Result Date: 11/23/2016 CLINICAL DATA:  Metastatic colon cancer. EXAM: MRI ABDOMEN WITHOUT CONTRAST  (INCLUDING MRCP) TECHNIQUE: Multiplanar multisequence MR imaging of the abdomen was performed. Heavily T2-weighted images of the biliary and pancreatic ducts were obtained, and three-dimensional MRCP images were rendered by post processing. COMPARISON:  Multiple exams, including 09/09/2016 PET-CT 11/22/2016 FINDINGS: Lower chest: Metastatic pulmonary nodules in the lung bases similar to prior. A left lower lobe nodule measures 2.0 cm transversely, formerly 1.9 cm by my measurement. Dependent atelectasis in both lower lobes. Trace left pleural effusion. Hepatobiliary: Innumerable metastatic lesions throughout the liver, dramatically increased tumor burden compared prior PET-CT. Multiple gallstones are present in the gallbladder, in addition to a prominently enlarged gallstone measuring 2.4 cm in diameter. Mild diffuse gallbladder wall thickening. There is intrahepatic biliary dilatation in the right hepatic lobe but not the left. In the vicinity of the confluence of the right and left hepatic ducts there is occlusion of the biliary tree with extensive tumor nodularity in this vicinity likely causing extrinsic compression although true invasion of the biliary tree is certainly not excluded given the degree of confluent tumor. The stenosis appears to involve both the confluence of the right and left hepatic ducts but also the central right hepatic ducts, with the ductal tree from segments 5 and 8 not appreciably connecting to the ductal tree from segments six and seven. The portal vein remains patent although branches of the portal vein in the right hepatic lobe per attenuated by tumor. The distal portion of the common hepatic duct  and the common bile duct appear patent and of normal caliber. Pancreas: There is extensive extrahepatic tumor in the porta hepatis, considerably increased from prior, potentially invading the pancreatic body from above, and abutting the right margin of the pancreatic head in the pancreaticoduodenal groove. This may represent infiltrative extension from the adenopathy. This surrounds portions of the portal vein. There is no dorsal pancreatic duct dilatation at this time. Spleen:  Unremarkable Adrenals/Urinary Tract: Bilateral thickened adrenal glands, stable, without overt mass. No renal mass identified. Stomach/Bowel: Wall thickening in portions of the ascending and transverse colon may simply be due to peristaltic activity but is nonspecific. Vascular/Lymphatic: In addition to the extensive somewhat infiltrative adenopathy in the porta hepatis, there is new pathologic retroperitoneal adenopathy. A left periaortic node measures 1.5 cm on image  34/3. Another left periaortic node measures 1.2 cm on image 41/ 3. Multiple additional abnormal retroperitoneal lymph nodes are present. Celiac trunk and its branches extend through malignant adenopathy in the gastrohepatic ligament/right gastric chain. Other:  Moderate scattered ascites. Musculoskeletal: Small T2 hyperintense lesions in the T10 and T12 vertebra are technically nonspecific. IMPRESSION: 1. Severe progression of malignancy with tumor now replacing much of the liver, bilateral malignant pulmonary nodules, and extensive increase in and adenopathy in the porta hepatis and also in the retroperitoneum. 2. 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 There is no was to know for certain whether this could  be crossed with a wire without trying it. Based solely on the imaging, I am skeptical of successful traversed meant of the approximately 2 cm region of occlusion. Although the proximal left hepatic duct likewise appears severely narrowed, there is no ductal dilatation in the left hepatic lobe and accordingly it is managing to drain. 3. The infiltrative tumor in the porta hepatis abuts the portal vein but does not occlude the portal vein. There is some narrowing of portal vein tributaries in the right hepatic lobe due to extensive tumor. 4. Cholelithiasis.  Gallbladder wall thickening is nonspecific. 5. Wall thickening in portions of the ascending and transverse colon may be incidental. 6. Moderate scattered ascites. 7. Small T2 hyperintense lesions in the T12 and T10 vertebra are technically nonspecific in could be hemangiomas or early malignant lesions. Electronically Signed   By: Van Clines M.D.   On: 11/23/2016 09:32   US Abdomen Limited Ruq  Result Date: 11/22/2016 CLINICAL DATA:  Jaundice. EXAM: ULTRASOUND ABDOMEN LIMITED RIGHT UPPER QUADRANT COMPARISON:  11/17/2016. PET-CT dated 09/09/2016 and abdomen and pelvis CTs dated 08/12/2016 and 03/28/2016. FINDINGS: Gallbladder: Again demonstrated is a large shadowing gallstone in the neck of the gallbladder measuring 2.5 cm in maximum diameter. Increased tumefactive sludge is also noted. Also demonstrated is gallbladder wall thickening with a maximum thickness of 6.3 mm. There is also some pericholecystic fluid. No sonographic Murphy sign. Common bile duct: Diameter: 6.3 mm Liver: The liver is heterogeneous with a slightly mass like appearance in the posterior aspect of the right lobe, measuring 6.3 x 5.8 x 4.7 cm. Suggestion of multiple additional poorly defined masses in the liver. The patient has known hepatic metastases with a history of colon cancer. Portal vein is patent on color Doppler imaging with normal direction of blood flow towards the liver.  IMPRESSION: 1. 2.5 cm stone lodged in the neck of the gallbladder with increased tumefactive sludge, wall thickening and interval pericholecystic fluid. This combination of findings is concerning for acute cholecystitis. There was no sonographic Murphy sign however. 2. Stable borderline dilated common duct with no intrahepatic ductal dilatation. 3. Known metastatic liver disease. Electronically Signed   By: Claudie Revering M.D.   On: 11/22/2016 10:57    Assessment: 61 y.o. female with metastatic colon cancer, admitted for worsening jaundice.  1.  Hyperbilirubinemia, secondary to diffuse liver metastasis 2.  Metastatic colon cancer, significantly progressed on chemo recently 3.  Acute on chronic kidney disease, worsening  4.  Type 2 diabetes 5.  Hypertension 6.  Deconditioning   Plan: -I reviewed her MRCP results with patient and her husband  in great detail.  Her hyperbilirubinemia is related to diffuse liver metastasis, which has significantly progressed recently.  Based on the MRI, but she is not a candidate for beta restenting or percutaneous drainage -Unfortunately her disease has progressed significantly, she also developed worsening renal function, she is not a candidate for further chemotherapy. -I recommend palliative care alone and hospice.  I had a long conversation with the patient and her husband.  Patient is more open to the idea of hospice, her husband is agreeable. -Please consult palliative care, and arrange hospice upon discharge -I will follow-up, please call me if you have questions.  Truitt Merle, MD 11/24/2016  9:02 AM Cell [phone]

## 2016-11-25 ENCOUNTER — Encounter: Payer: Self-pay | Admitting: General Practice

## 2016-11-25 DIAGNOSIS — Z515 Encounter for palliative care: Secondary | ICD-10-CM

## 2016-11-25 DIAGNOSIS — Z7189 Other specified counseling: Secondary | ICD-10-CM

## 2016-11-25 LAB — GLUCOSE, CAPILLARY
GLUCOSE-CAPILLARY: 136 mg/dL — AB (ref 65–99)
Glucose-Capillary: 152 mg/dL — ABNORMAL HIGH (ref 65–99)
Glucose-Capillary: 86 mg/dL (ref 65–99)
Glucose-Capillary: 123 mg/dL — ABNORMAL HIGH (ref 65–99)

## 2016-11-25 MED ORDER — MORPHINE SULFATE (PF) 4 MG/ML IV SOLN: 1.0000 mg | INTRAVENOUS | Status: DC | PRN

## 2016-11-25 NOTE — Progress Notes (Signed)
Am assessment unchanged . Pt to be moved to [place] to accommodate surgical  bed needs. Report called to [facility]

## 2016-11-25 NOTE — Progress Notes (Signed)
PROGRESS NOTE    Michelle Erickson  SEG:[id number] DOB: 1955/08/05 DOA: 11/22/2016 PCP: Dione Housekeeper, MD   Brief Narrative: 61 -year-old female with metastatic colon cancer following with oncology, chronic kidney disease is stage III presented with worsening jaundice, nausea and vomiting. Seen by oncology and recommended hospice care and palliative care consult.  Assessment & Plan:   Active Problems:   Jaundice   Acute on chronic renal failure (HCC)   Metastatic colon cancer in female [facility])  Type 2 diabetes: Continue current insulin regimen. Monitor blood sugar level.  -MRCP with severe progression of malignancy and metastatic disease. No further intervention per general surgery, GI. Oncologist recommended hospice care and palliative care. Palliative care consult requested and discussed with the team today. Patient with progression of disease and worsening renal failure. -Patient with poor prognosis. I reviewed the goals of care with the patient, her husband and daughter at bedside. Agreed with hospice care. Follow-up care management and palliative evaluation. Discontinue IV antibiotics. Pain is controlled.   Pt and family agreed not pursuing invasive test including lab.  Very poor prognosis.  DVT prophylaxis: Lovenox subcutaneous Code Status: Full code Family Communication: Discussed with the patient's husband and daughter at bedside Disposition Plan: Currently admitted    Consultants:     Procedures: MRCP Antimicrobials: Ceftriaxone since November 3 Subjective: Seen and examined at bedside. Mild abdominal discomfort. Denied headache, dizziness, nausea or vomiting. Family at bedside. Objective: Vitals:   11/23/16 1233 11/23/16 2130 11/24/16 0535 11/24/16 0953  BP: (!) 158/81 133/77 136/79 (!) 144/80  Pulse: 83 86 (!) 55 85  Resp: 16 16 18    Temp: 98.6 F (37 C) 98.9 F (37.2 C) 98.3 F (36.8 C)   TempSrc: Oral Oral Oral   SpO2: 99% 97% 96%   Weight:      Height:         Intake/Output Summary (Last 24 hours) at 11/24/2016 1115 Last data filed at 11/24/2016 1032 Gross per 24 hour  Intake 1790 ml  Output 680 ml  Net 1110 ml   Filed Weights   11/22/16 0538  Weight: 62.1 kg (137 lb)    Examination: Physical exam unchanged from yesterday.  General exam: Not in distress Eye: icterus +++ Respiratory system: Clear bilaterally, respiratory effort normal Cardiovascular system: S1 & S2 heard, RRR.  No pedal edema. Gastrointestinal system: Abdomen is firm, mild distention. Positive bowel sound Central nervous system: Alert and oriented. No focal neurological deficits. Extremities: Symmetric 5 x 5 power. Psychiatry: Judgement and insight appear normal. Mood & affect appropriate.     Data Reviewed: I have personally reviewed following labs and imaging studies  CBC: Recent Labs  Lab 11/22/16 0850 11/23/16 0521 11/24/16 0337  WBC 4.5 5.5 5.1  NEUTROABS  --   --  3.6  HGB 10.4* 9.7* 9.5*  HCT 30.5* 28.8* 28.6*  MCV 87.9 88.3 88.3  PLT 180 187 737   Basic Metabolic Panel: Recent Labs  Lab 11/22/16 0850 11/23/16 0521 11/24/16 0337  NA 133* 136 136  K 5.0 5.1 4.4  CL 103 105 107  CO2 20* 18* 17*  GLUCOSE 158* 118* 131*  BUN 45* 55* 64*  CREATININE 1.97* 2.32* 2.78*  CALCIUM 9.3 8.8* 8.8*  MG  --  1.7  --   PHOS  --  5.5*  --    GFR: Estimated Creatinine Clearance: 17.9 mL/min (A) (by C-G formula based on SCr of 2.78 mg/dL (H)). Liver Function Tests: Recent Labs  Lab 11/22/16 [phone]  11/23/16 0521 11/24/16 0337  AST 218* 147* 97*  ALT 83* 77* 67*  ALKPHOS 514* 457* 455*  BILITOT 20.9* 19.9* 19.1*  PROT 7.0 6.6 6.4*  ALBUMIN 2.1* 1.9* 1.8*   Recent Labs  Lab 11/22/16 0850  LIPASE 13   No results for input(s): AMMONIA in the last 168 hours. Coagulation Profile: No results for input(s): INR, PROTIME in the last 168 hours. Cardiac Enzymes: No results for input(s): CKTOTAL, CKMB, CKMBINDEX, TROPONINI in the last 168 hours. BNP  (last 3 results) No results for input(s): PROBNP in the last 8760 hours. HbA1C: No results for input(s): HGBA1C in the last 72 hours. CBG: Recent Labs  Lab 11/23/16 0805 11/23/16 1149 11/23/16 1703 11/23/16 2156 11/24/16 0739  GLUCAP 109* 225* 253* 128* 108*   Lipid Profile: No results for input(s): CHOL, HDL, LDLCALC, TRIG, CHOLHDL, LDLDIRECT in the last 72 hours. Thyroid Function Tests: No results for input(s): TSH, T4TOTAL, FREET4, T3FREE, THYROIDAB in the last 72 hours. Anemia Panel: No results for input(s): VITAMINB12, FOLATE, FERRITIN, TIBC, IRON, RETICCTPCT in the last 72 hours. Sepsis Labs: No results for input(s): PROCALCITON, LATICACIDVEN in the last 168 hours.  Recent Results (from the past 240 hour(s))  TECHNOLOGIST REVIEW     Status: None   Collection Time: 11/14/16  2:49 PM  Result Value Ref Range Status   Technologist Review Rare metamyelocyte and nRBC present  Final         Radiology Studies: Mr Abdomen With Mrcp W Contrast  Result Date: 11/23/2016 CLINICAL DATA:  Metastatic colon cancer. EXAM: MRI ABDOMEN WITHOUT CONTRAST  (INCLUDING MRCP) TECHNIQUE: Multiplanar multisequence MR imaging of the abdomen was performed. Heavily T2-weighted images of the biliary and pancreatic ducts were obtained, and three-dimensional MRCP images were rendered by post processing. COMPARISON:  Multiple exams, including 09/09/2016 PET-CT 11/22/2016 FINDINGS: Lower chest: Metastatic pulmonary nodules in the lung bases similar to prior. A left lower lobe nodule measures 2.0 cm transversely, formerly 1.9 cm by my measurement. Dependent atelectasis in both lower lobes. Trace left pleural effusion. Hepatobiliary: Innumerable metastatic lesions throughout the liver, dramatically increased tumor burden compared prior PET-CT. Multiple gallstones are present in the gallbladder, in addition to a prominently enlarged gallstone measuring 2.4 cm in diameter. Mild diffuse gallbladder wall  thickening. There is intrahepatic biliary dilatation in the right hepatic lobe but not the left. In the vicinity of the confluence of the right and left hepatic ducts there is occlusion of the biliary tree with extensive tumor nodularity in this vicinity likely causing extrinsic compression although true invasion of the biliary tree is certainly not excluded given the degree of confluent tumor. The stenosis appears to involve both the confluence of the right and left hepatic ducts but also the central right hepatic ducts, with the ductal tree from segments 5 and 8 not appreciably connecting to the ductal tree from segments six and seven. The portal vein remains patent although branches of the portal vein in the right hepatic lobe per attenuated by tumor. The distal portion of the common hepatic duct and the common bile duct appear patent and of normal caliber. Pancreas: There is extensive extrahepatic tumor in the porta hepatis, considerably increased from prior, potentially invading the pancreatic body from above, and abutting the right margin of the pancreatic head in the pancreaticoduodenal groove. This may represent infiltrative extension from the adenopathy. This surrounds portions of the portal vein. There is no dorsal pancreatic duct dilatation at this time. Spleen:  Unremarkable Adrenals/Urinary Tract: Bilateral thickened  adrenal glands, stable, without overt mass. No renal mass identified. Stomach/Bowel: Wall thickening in portions of the ascending and transverse colon may simply be due to peristaltic activity but is nonspecific. Vascular/Lymphatic: In addition to the extensive somewhat infiltrative adenopathy in the porta hepatis, there is new pathologic retroperitoneal adenopathy. A left periaortic node measures 1.5 cm on image 34/3. Another left periaortic node measures 1.2 cm on image 41/ 3. Multiple additional abnormal retroperitoneal lymph nodes are present. Celiac trunk and its branches extend  through malignant adenopathy in the gastrohepatic ligament/right gastric chain. Other:  Moderate scattered ascites. Musculoskeletal: Small T2 hyperintense lesions in the T10 and T12 vertebra are technically nonspecific. IMPRESSION: 1. Severe progression of malignancy with tumor now replacing much of the liver, bilateral malignant pulmonary nodules, and extensive increase in and adenopathy in the porta hepatis and also in the retroperitoneum. 2. 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 There is no was to know for certain whether this could be crossed with a wire without trying it. Based solely on the imaging, I am skeptical of successful traversed meant of the approximately 2 cm region of occlusion. Although the proximal left hepatic duct likewise appears severely narrowed, there is no ductal dilatation in the left hepatic lobe and accordingly it is managing to drain. 3. The infiltrative tumor in the porta hepatis abuts the portal vein but does not occlude the portal vein. There is some narrowing of portal vein tributaries in the right hepatic lobe due to extensive tumor. 4. Cholelithiasis.  Gallbladder wall thickening is nonspecific. 5. Wall thickening in portions of the ascending and transverse colon may be incidental. 6. Moderate scattered ascites. 7. Small T2 hyperintense lesions in the T12 and T10 vertebra are technically nonspecific in could be hemangiomas or early malignant lesions. Electronically Signed   By: Van Clines M.D.   On: 11/23/2016 09:32        Scheduled Meds: . amLODipine  5 mg Oral q morning - 10a  . enoxaparin (LOVENOX) injection  30 mg Subcutaneous Q24H  . insulin aspart  0-9 Units Subcutaneous TID WC  . sodium  chloride flush  10-40 mL Intracatheter Q12H   Continuous Infusions: . sodium chloride 75 mL/hr at 11/23/16 2152  . cefTRIAXone (ROCEPHIN)  IV Stopped (11/23/16 2222)     LOS: 2 days    Dron Tanna Furry, MD [facility] Pager [phone]  If 7PM-7AM, please contact night-coverage [email or web address] Password [email or web address] 11/24/2016, 11:15 AM

## 2016-11-25 NOTE — Progress Notes (Signed)
[place] Spiritual Care Note  Visited briefly with Ms Trouten and family during her inpt stay at [facility], per referral from night chaplain Ernest Haber.  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 Described Spiritual Care as a support resource that collaborates with Palliative Care, as Jinny Blossom Mason/NP was at beside working on care plan with pt/family.  Brought a prayer shawl as a tangible sign of comfort and care, for which pt verbalized appreciation.  Pt anticipates transfer this evening, but please page if further needs arise during this stay.  Thank you.   [place], [place], Operating Room Services [facility] M-F daytime pager [phone] [facility] 24/7 pager [phone] Voicemail [phone]

## 2016-11-25 NOTE — Consult Note (Signed)
Consultation Note Date: 11/25/2016   Patient Name: Michelle Erickson  DOB: [birth year]  MRN: [id number]  Age / Sex: 61 y.o., female  PCP: Dione Housekeeper, MD Referring Physician: Rosita Fire, MD  Reason for Consultation: Establishing goals of care  HPI/Patient Profile: 61 y.o. female  with past medical history of metastatic colon cancer to liver, hypertension, hyperlipidemia, diabetes mellitus, and back pain admitted on 11/22/2016 with jaundice, nausea, and vomiting. Patient with hyperbilirubinemia and acute kidney injury on chronic kidney disease with worsening renal functions. Abdominal ultrasound revealed stone lodged in the neck of gallbladder/possible acute cholecystitis. MRCP revealed severe progression of malignancy. Evaluated by surgery, GI, and oncology. Patient is not a candidate for surgical intervention. Per GI, no stricture in CBD to stent. Oncology recommending palliative/hospice services on discharge. Palliative medicine consultation for goals of care/hospice evaluation.   Clinical Assessment and Goals of Care: I have reviewed medical records, discussed with care team and met with patient, husband Charlotte Crumb), and daughter Gilford Raid) at bedside to discuss diagnosis prognosis, Miller, EOL wishes, disposition and options.  Introduced Palliative Medicine as specialized medical care for people living with serious illness. It focuses on providing relief from the symptoms and stress of a serious illness. The goal is to improve quality of life for both the patient and the family.  We discussed a brief life review of the patient. Prior to hospitalization, living with husband in mobile home. Very supportive family including two adult children and a twin sister. Diagnosed with colon cancer in 2016 and was taking oral chemotherapy up until two weeks ago. Husband speaks of increased weakness and jaundice. Appetite has  been fair. 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   Discussed hospital diagnoses and interventions. Family understands she is not a candidate for further chemotherapy or invasive procedures. Explained concern with continued decline secondary to metastatic cancer and worsening renal functions with chronic kidney disease.   Advanced directives, concepts specific to code status, artifical feeding and hydration, and rehospitalization were considered and discussed. Introduced MOST form. Patient does NOT have a living will or documented HCPOA. I asked her thoughts on big decisions she is faced with including resuscitation/life support. Patient is still trying to process these decisions. Educated on medical recommendation for DNR/DNI with progressive cancer and poor chance of meaningful recovery after CPR. Left Hard Choices book and MOST form and encouraged her and family to continue conversations regarding heroic measures at EOL.  Palliative Care and hospice services outpatient were explained and offered. They seem surprised with I mention hospice services. They were told home health was to be arranged on discharge but did not understand this was hospice. Educated on transition to comfort focused care. Explained hospice philosophy with focus of comfort and quality at EOL. Also discussed symptom management and preventing re-hospitalization.   The patient speaks of her faith and only God knowing how much time is left.   At this point, family interested in home health services with outpatient palliative. They are interested in physical therapy. Explained  that palliative services can transition to hospice services when they are ready.   Questions and concerns were addressed. Therapeutic listening and emotional/spiritual support provided.    SUMMARY OF RECOMMENDATIONS    Patient/family still processing EOL decisions including resuscitation/life support. Provided Hard Choices copy and MOST form.  Encouraged they continue conversations regarding EOL decisions as cancer continues to progress.   Discussed concern with continued decline secondary to progressive cancer and acute on chronic kidney disease.  Not ready for hospice services. Patient/family prefer home health with palliative services on discharge understanding this can transition to hospice services when ready.   PMT will continue to support patient/family through hospitalization.   Code Status/Advance Care Planning:  Full code-educated on medical recommendation for DNR/DNI with progressive cancer  Symptom Management:   Per attending  Palliative Prophylaxis:   Bowel Regimen and Frequent Pain Assessment  Psycho-social/Spiritual:   Desire for further Chaplaincy support:yes  Additional Recommendations: Caregiving  Support/Resources and Education on Hospice  Prognosis:   Poor prognosis secondary to metastatic colon cancer, acute on chronic kidney disease, and declining functional status.   Discharge Planning: Home with Home Health palliative services to follow     Primary Diagnoses: Present on Admission: . Jaundice . Acute on chronic renal failure ([place])   I have reviewed the medical record, interviewed the patient and family, and examined the patient. The following aspects are pertinent.  Past Medical History:  Diagnosis Date  . Back pain   . Diabetes mellitus without complication ([place])   . Family history of colon cancer   . Hyperlipemia   . Hypertension   . Metastatic colon cancer to liver ([place]) 07/21/2014  . Snoring   . Wears glasses    contacts/glasses   Social History   Socioeconomic History  . Marital status: Married    Spouse name: Charlotte Crumb  . Number of children: 2  . Years of education: None  . Highest education level: None  Social Needs  . Financial resource strain: None  . Food insecurity - worry: None  . Food insecurity - inability: None  . Transportation needs - medical: None  .  Transportation needs - non-medical: None  Occupational History  . None  Tobacco Use  . Smoking status: Former Smoker    Packs/day: 1.00    Years: 6.00    Pack years: 6.00    Types: Cigarettes    Last attempt to quit: 01/21/1976    Years since quitting: 40.8  . Smokeless tobacco: Never Used  Substance and Sexual Activity  . Alcohol use: No  . Drug use: No  . Sexual activity: Yes    Birth control/protection: Post-menopausal    Comment: # 2 pregnancies and #2 live births  Other Topics Concern  . None  Social History Narrative   Married, husband [name]   Has #2 adult children   Previously worked at [employer] History  Problem Relation Age of Onset  . COPD Father   . Heart attack Father   . Arthritis Sister   . Diabetes Sister   . Colon cancer Sister        dx over 55  . Colon polyps Sister   . Seizures Brother   . Colon cancer Sister        dx in her early 55s, died in her early to mid 42s  . Diabetes Mother    Scheduled Meds: . amLODipine  5 mg Oral q morning - 10a  . enoxaparin (LOVENOX) injection  30 mg Subcutaneous Q24H  . insulin aspart  0-9 Units Subcutaneous TID WC  . sodium chloride flush  10-40 mL Intracatheter Q12H   Continuous Infusions: . sodium chloride 75 mL/hr (11/25/16 1416)   PRN Meds:.loperamide, morphine injection, ondansetron (ZOFRAN) IV, ondansetron, sodium chloride flush, traMADol Medications Prior to Admission:  Prior to Admission medications   Medication Sig Start Date End Date Taking? Authorizing Provider  amLODipine (NORVASC) 10 MG tablet Take 5 mg by mouth every morning.    Yes [provider]  atorvastatin (LIPITOR) 40 MG tablet Take 40 mg by mouth every evening.   Yes [provider]  diphenhydrAMINE (BENADRYL) 25 mg capsule Take 25 mg by mouth every 6 (six) hours as needed for itching.   Yes [provider]  ferrous sulfate 325 (65 FE) MG tablet Take 325 mg by mouth daily with breakfast.   Yes  [provider]  Insulin Glargine (LANTUS SOLOSTAR) 100 UNIT/ML Solostar Pen Inject 10 Units into the skin daily at 10 pm. Patient taking differently: Inject 18 Units into the skin daily at 10 pm.  08/15/16  Yes Domenic Polite, MD  insulin lispro (HUMALOG) 100 UNIT/ML injection Inject 4-8 Units into the skin 2 (two) times daily as needed for high blood sugar (per sliding scale). Sliding scale    Yes [provider]  lidocaine-prilocaine (EMLA) cream Apply 1 application topically once.  08/08/14  Yes [provider]  loperamide (IMODIUM) 2 MG capsule Take 2 mg by mouth 3 (three) times daily as needed for diarrhea or loose stools.   Yes [provider]  ondansetron (ZOFRAN) 8 MG tablet Take 0.5-1 tablets (4-8 mg total) by mouth 2 (two) times daily. Take 1 hour prior to Lonsurf dose for nausea prophylaxis 09/17/16  Yes Truitt Merle, MD  traMADol (ULTRAM) 50 MG tablet Take 1 tablet (50 mg total) by mouth every 6 (six) hours as needed. Patient taking differently: Take 50 mg by mouth every 6 (six) hours as needed for moderate pain.  06/11/16  Yes Truitt Merle, MD  trifluridine-tipiracil (LONSURF) 20-8.19 MG tablet TAKE 2 TABLETS ('40MG'$ ) BY MOUTH TWICE DAILY,1 HOUR AFTER AM & PM MEALS ON DAYS 1-5, 8-12. REPEAT EVERY 28 DAYS 10/16/16  Yes Truitt Merle, MD   No Known Allergies Review of Systems  Constitutional: Positive for activity change.  Respiratory: Negative for shortness of breath.   Gastrointestinal: Positive for abdominal pain. Negative for constipation, nausea and vomiting.  Neurological: Positive for weakness.   Physical Exam  Constitutional: She is oriented to person, place, and time. She is cooperative. She appears ill.  HENT:  Head: Normocephalic and atraumatic.  Eyes: Scleral icterus is present.  Pulmonary/Chest: Effort normal.  Abdominal: There is tenderness.  States relief from heating pad  Neurological: She is alert and oriented to person, place, and time.    Skin: Skin is warm and dry.  jaundice  Psychiatric: She has a normal mood and affect. Her speech is normal and behavior is normal. Cognition and memory are normal.  Nursing note and vitals reviewed.  Vital Signs: BP 134/79 (BP Location: Right Arm)   Pulse 82   Temp 97.8 F (36.6 C) (Oral)   Resp 18   Ht '5\' 4"'$  (1.626 m)   Wt 62.1 kg (137 lb)   SpO2 98%   BMI 23.52 kg/m  Pain Assessment: No/denies pain   Pain Score: Asleep   SpO2: SpO2: 98 % O2 Device:SpO2: 98 % O2 Flow Rate: .   IO: Intake/output  summary:   Intake/Output Summary (Last 24 hours) at 11/25/2016 1444 Last data filed at 11/25/2016 0800 Gross per 24 hour  Intake 1126.25 ml  Output 525 ml  Net 601.25 ml    LBM: Last BM Date: 11/25/16 Baseline Weight: Weight: 62.1 kg (137 lb) Most recent weight: Weight: 62.1 kg (137 lb)     Palliative Assessment/Data: PPS 40%   Flowsheet Rows     Most Recent Value  Intake Tab  Referral Department  Hospitalist  Unit at Time of Referral  Med/Surg Unit  Palliative Care Primary Diagnosis  Cancer  Palliative Care Type  New Palliative care  Date first seen by Palliative Care  11/25/16  Clinical Assessment  Palliative Performance Scale Score  40%  Psychosocial & Spiritual Assessment  Palliative Care Outcomes  Patient/Family meeting held?  Yes  Who was at the meeting?  patient, husband, daughter  Palliative Care Outcomes  Clarified goals of care, Counseled regarding hospice, Provided end of life care assistance, Provided psychosocial or spiritual support, Linked to palliative care logitudinal support, ACP counseling assistance      Time In: 1300 Time Out: 1410 Time Total: 65mn Greater than 50%  of this time was spent counseling and coordinating care related to the above assessment and plan.  Signed by:  MIhor Dow FNP-C Palliative Medicine Team  Phone: [phone]: [phone]  Please contact Palliative Medicine Team phone at [phone] questions and  concerns.  For individual provider: See AShea Evans

## 2016-11-26 DIAGNOSIS — Z515 Encounter for palliative care: Secondary | ICD-10-CM

## 2016-11-26 LAB — GLUCOSE, CAPILLARY
Glucose-Capillary: 125 mg/dL — ABNORMAL HIGH (ref 65–99)
Glucose-Capillary: 70 mg/dL (ref 65–99)

## 2016-11-26 MED ORDER — HEPARIN SOD (PORK) LOCK FLUSH 100 UNIT/ML IV SOLN
500.0000 [IU] | Freq: Once | INTRAVENOUS | Status: DC
  Filled 2016-11-26: qty 5

## 2016-11-26 MED ORDER — TRAMADOL HCL 50 MG PO TABS: 50.0000 mg | ORAL_TABLET | Freq: Four times a day (QID) | ORAL | 0 refills | Status: AC | PRN

## 2016-11-26 MED ORDER — ONDANSETRON 4 MG PO TBDP: 4.0000 mg | ORAL_TABLET | Freq: Once | ORAL | 0 refills | Status: AC | PRN

## 2016-11-26 NOTE — Discharge Summary (Addendum)
Physician Discharge Summary  RUIE SENDEJO PYK:[id number] DOB: 12-25-1955 DOA: 11/22/2016  PCP: Dione Housekeeper, MD  Admit date: 11/22/2016 Discharge date: 11/26/2016  Admitted From:home Disposition:home with home care (declined home hospice)  Recommendations for Outpatient Follow-up:  1. Follow up with PCP and oncology within 1weeks 2. Please obtain BMP/CBC in one week  Home Health:yes Equipment/Devices: Discharge Condition:stable but poor prognosis CODE STATUS:full Diet recommendation:regular  Brief/Interim Summary: 61 -year-old female with metastatic colon cancer following with oncology, chronic kidney disease is stage III presented with worsening jaundice, nausea and vomiting. Seen by oncology and recommended hospice care and palliative care consult  MRCP with severe progression of malignancy and metastatic disease. No further intervention per general surgery. Oncologist recommended hospice care and palliative care.  Evaluated by palliative care services. I have discussed with the patient, her husband and daughter. They do not like hospice care at this time and would like to go home with home care services with outpatient follow-up. Outpatient referral to palliative care made. Continue supportive care and pain management. Patient looked comfortable. She has worsening renal failure and widely spreaded metastatic colon cancer.   Patient has poor prognosis and would benefit from hospice care however patient and family declined. At this time patient will be discharged home with home care services as per patient and family's request. Recommended outpatient follow-up.  I have discussed with the nursing staff and case manager regarding the discharge plan.  Discharge Diagnoses:  Active Problems:   Jaundice   Acute on chronic renal failure (HCC), acute kidney injury on CKD stage 3   Metastatic colon cancer in female [facility]) Type 2 diabetes on chronic insulin   Discharge  Instructions  Discharge Instructions    Amb Referral to Palliative Care   Complete by:  As directed    Diet general   Complete by:  As directed    Increase activity slowly   Complete by:  As directed      Allergies as of 11/26/2016   No Known Allergies     Medication List    STOP taking these medications   Insulin Glargine 100 UNIT/ML Solostar Pen Commonly known as:  LANTUS SOLOSTAR     TAKE these medications   amLODipine 10 MG tablet Commonly known as:  NORVASC Take 5 mg by mouth every morning.   atorvastatin 40 MG tablet Commonly known as:  LIPITOR Take 40 mg by mouth every evening.   diphenhydrAMINE 25 mg capsule Commonly known as:  BENADRYL Take 25 mg by mouth every 6 (six) hours as needed for itching.   ferrous sulfate 325 (65 FE) MG tablet Take 325 mg by mouth daily with breakfast.   insulin lispro 100 UNIT/ML injection Commonly known as:  HUMALOG Inject 4-8 Units into the skin 2 (two) times daily as needed for high blood sugar (per sliding scale). Sliding scale   lidocaine-prilocaine cream Commonly known as:  EMLA Apply 1 application topically once.   loperamide 2 MG capsule Commonly known as:  IMODIUM Take 2 mg by mouth 3 (three) times daily as needed for diarrhea or loose stools.   ondansetron 4 MG disintegrating tablet Commonly known as:  ZOFRAN-ODT Take 1 tablet (4 mg total) once as needed by mouth for nausea.   ondansetron 8 MG tablet Commonly known as:  ZOFRAN Take 0.5-1 tablets (4-8 mg total) by mouth 2 (two) times daily. Take 1 hour prior to Lonsurf dose for nausea prophylaxis   traMADol 50 MG tablet Commonly known as:  Veatrice Bourbon  Take 1 tablet (50 mg total) every 6 (six) hours as needed by mouth for moderate pain.   trifluridine-tipiracil 20-8.19 MG tablet Commonly known as:  LONSURF TAKE 2 TABLETS (40MG ) BY MOUTH TWICE DAILY,1 HOUR AFTER AM & PM MEALS ON DAYS 1-5, 8-12. REPEAT EVERY 28 DAYS      Follow-up Information    Dione Housekeeper,  MD. Schedule an appointment as soon as possible for a visit in 1 week(s).   Specialty:  Family Medicine Contact information: [place] [address] [phone]        Truitt Merle, MD Follow up.   Specialties:  Hematology, Oncology Contact information: [place] [address] [phone]          No Known Allergies  Consultations: Palliative care medicine oncology  Procedures/Studies: MRCP  Subjective: Seen and examined at bedside. Has mild abdominal discomfort and chronic nausea. No vomiting. No headache or dizziness. Daughter at bedside.  Discharge Exam: Vitals:   11/25/16 2139 11/26/16 0510  BP: (!) 147/76 137/87  Pulse: 84 82  Resp: 20 20  Temp: 97.8 F (36.6 C) 97.6 F (36.4 C)  SpO2: 98% 97%   Vitals:   11/25/16 1000 11/25/16 1454 11/25/16 2139 11/26/16 0510  BP:  (!) 153/92 (!) 147/76 137/87  Pulse:  89 84 82  Resp:  18 20 20   Temp:  98.2 F (36.8 C) 97.8 F (36.6 C) 97.6 F (36.4 C)  TempSrc:  Oral Oral Oral  SpO2: 98% 96% 98% 97%  Weight:      Height:        General: Pt is alert, awake, not in acute distress,  Both eye icterus +++ Cardiovascular: RRR, S1/S2 +, no rubs, no gallops Respiratory: CTA bilaterally, no wheezing, no rhonchi Abdominal: Soft, NT, distended, bowel sounds + Extremities: no edema, no cyanosis    The results of significant diagnostics from this hospitalization (including imaging, microbiology, ancillary and laboratory) are listed below for reference.     Microbiology: No results found for this or any previous visit (from the past 240 hour(s)).   Labs: BNP (last 3 results) No results for input(s): BNP in the last 8760 hours. Basic Metabolic Panel: Recent Labs  Lab 11/22/16 0850 11/23/16 0521 11/24/16 0337  NA 133* 136 136  K 5.0 5.1 4.4  CL 103 105 107  CO2 20* 18* 17*  GLUCOSE 158* 118* 131*  BUN 45* 55* 64*  CREATININE 1.97* 2.32* 2.78*  CALCIUM 9.3 8.8* 8.8*  MG   --  1.7  --   PHOS  --  5.5*  --    Liver Function Tests: Recent Labs  Lab 11/22/16 0850 11/23/16 0521 11/24/16 0337  AST 218* 147* 97*  ALT 83* 77* 67*  ALKPHOS 514* 457* 455*  BILITOT 20.9* 19.9* 19.1*  PROT 7.0 6.6 6.4*  ALBUMIN 2.1* 1.9* 1.8*   Recent Labs  Lab 11/22/16 0850  LIPASE 13   No results for input(s): AMMONIA in the last 168 hours. CBC: Recent Labs  Lab 11/22/16 0850 11/23/16 0521 11/24/16 0337  WBC 4.5 5.5 5.1  NEUTROABS  --   --  3.6  HGB 10.4* 9.7* 9.5*  HCT 30.5* 28.8* 28.6*  MCV 87.9 88.3 88.3  PLT 180 187 184   Cardiac Enzymes: No results for input(s): CKTOTAL, CKMB, CKMBINDEX, TROPONINI in the last 168 hours. BNP: Invalid input(s): POCBNP CBG: Recent Labs  Lab 11/25/16 0745 11/25/16 1223 11/25/16 1737 11/25/16 2134 11/26/16 0725  GLUCAP 136* 152*  86 123* 125*   D-Dimer No results for input(s): DDIMER in the last 72 hours. Hgb A1c No results for input(s): HGBA1C in the last 72 hours. Lipid Profile No results for input(s): CHOL, HDL, LDLCALC, TRIG, CHOLHDL, LDLDIRECT in the last 72 hours. Thyroid function studies No results for input(s): TSH, T4TOTAL, T3FREE, THYROIDAB in the last 72 hours.  Invalid input(s): FREET3 Anemia work up No results for input(s): VITAMINB12, FOLATE, FERRITIN, TIBC, IRON, RETICCTPCT in the last 72 hours. Urinalysis    Component Value Date/Time   COLORURINE AMBER (A) 11/22/2016 0647   APPEARANCEUR CLOUDY (A) 11/22/2016 0647   LABSPEC >1.030 (H) 11/22/2016 0647   PHURINE 5.0 11/22/2016 0647   GLUCOSEU 250 (A) 11/22/2016 0647   HGBUR SMALL (A) 11/22/2016 0647   BILIRUBINUR LARGE (A) 11/22/2016 0647   KETONESUR NEGATIVE 11/22/2016 0647   PROTEINUR >300 (A) 11/22/2016 0647   UROBILINOGEN 1.0 08/11/2014 2224   NITRITE NEGATIVE 11/22/2016 0647   LEUKOCYTESUR NEGATIVE 11/22/2016 0647   Sepsis Labs Invalid input(s): PROCALCITONIN,  WBC,  LACTICIDVEN Microbiology No results found for this or any  previous visit (from the past 240 hour(s)).   Time coordinating discharge: 34 minutes  SIGNED:   Rosita Fire, MD  [facility] 11/26/2016, 10:49 AM  If 7PM-7AM, please contact night-coverage [email or web address] Password [email or web address]

## 2016-11-26 NOTE — Care Management Note (Signed)
Case Management Note  Patient Details  Name: Michelle Erickson MRN: [id number] Date of Birth: Sep 17, 1955  Subjective/Objective:  61 yo admitted with Jaundice. Hx of metastatic colon cancer    Action/Plan: From home with spouse. This CM met with pt, husband and daughter at bedside for DC planning. Choice offered for home health services and [facility] chosen. [facility] rep contacted for referral and for need for RW. Palliative care referral made to [facility] per request of family and MD.   Expected Discharge Date:  11/26/16               Expected Discharge Plan:  [facility](also home Palliative)  In-House Referral:     Discharge planning Services  CM Consult  Post Acute Care Choice:  Home Health(Palliative) Choice offered to:  Spouse  DME Arranged:  Gilford Rile rolling DME Agency:  [place] Arranged:  RN, PT, Social Work, Nurse's Aide, Disease Management [place] Agency:  [place] of Service:  In process, will continue to follow  If discussed at Long Length of Stay Meetings, dates discussed:    Additional CommentsLynnell Catalan, RN 11/26/2016, 1:50 PM [phone]

## 2016-11-28 ENCOUNTER — Encounter (HOSPITAL_COMMUNITY): Payer: Self-pay | Admitting: *Deleted

## 2016-11-28 ENCOUNTER — Telehealth: Payer: Self-pay | Admitting: Hematology

## 2016-11-28 ENCOUNTER — Emergency Department (HOSPITAL_COMMUNITY)
Admission: EM | Admit: 2016-11-28 | Discharge: 2016-11-29 | Disposition: A | Discharge status: Hospice | Payer: BLUE CROSS/BLUE SHIELD | Attending: Emergency Medicine | Admitting: Emergency Medicine

## 2016-11-28 ENCOUNTER — Other Ambulatory Visit: Payer: Self-pay

## 2016-11-28 DIAGNOSIS — N19 Unspecified kidney failure: Secondary | ICD-10-CM

## 2016-11-28 DIAGNOSIS — R531 Weakness: Secondary | ICD-10-CM | POA: Diagnosis present

## 2016-11-28 DIAGNOSIS — I129 Hypertensive chronic kidney disease with stage 1 through stage 4 chronic kidney disease, or unspecified chronic kidney disease: Secondary | ICD-10-CM | POA: Insufficient documentation

## 2016-11-28 DIAGNOSIS — R627 Adult failure to thrive: Secondary | ICD-10-CM | POA: Insufficient documentation

## 2016-11-28 DIAGNOSIS — C799 Secondary malignant neoplasm of unspecified site: Secondary | ICD-10-CM | POA: Insufficient documentation

## 2016-11-28 DIAGNOSIS — Z7189 Other specified counseling: Secondary | ICD-10-CM | POA: Diagnosis not present

## 2016-11-28 DIAGNOSIS — Z87891 Personal history of nicotine dependence: Secondary | ICD-10-CM | POA: Insufficient documentation

## 2016-11-28 DIAGNOSIS — R4182 Altered mental status, unspecified: Secondary | ICD-10-CM

## 2016-11-28 DIAGNOSIS — Z79899 Other long term (current) drug therapy: Secondary | ICD-10-CM | POA: Insufficient documentation

## 2016-11-28 DIAGNOSIS — C785 Secondary malignant neoplasm of large intestine and rectum: Secondary | ICD-10-CM | POA: Insufficient documentation

## 2016-11-28 DIAGNOSIS — N179 Acute kidney failure, unspecified: Secondary | ICD-10-CM | POA: Insufficient documentation

## 2016-11-28 DIAGNOSIS — K7201 Acute and subacute hepatic failure with coma: Secondary | ICD-10-CM | POA: Diagnosis not present

## 2016-11-28 DIAGNOSIS — E1122 Type 2 diabetes mellitus with diabetic chronic kidney disease: Secondary | ICD-10-CM | POA: Insufficient documentation

## 2016-11-28 DIAGNOSIS — Z794 Long term (current) use of insulin: Secondary | ICD-10-CM | POA: Insufficient documentation

## 2016-11-28 DIAGNOSIS — Z515 Encounter for palliative care: Secondary | ICD-10-CM | POA: Insufficient documentation

## 2016-11-28 DIAGNOSIS — H1589 Other disorders of sclera: Secondary | ICD-10-CM | POA: Insufficient documentation

## 2016-11-28 DIAGNOSIS — N183 Chronic kidney disease, stage 3 (moderate): Secondary | ICD-10-CM | POA: Diagnosis not present

## 2016-11-28 LAB — BASIC METABOLIC PANEL
Anion gap: 14 (ref 5–15)
BUN: 101 mg/dL — AB (ref 6–20)
CHLORIDE: 109 mmol/L (ref 101–111)
CO2: 12 mmol/L — ABNORMAL LOW (ref 22–32)
CREATININE: 6.26 mg/dL — AB (ref 0.44–1.00)
Calcium: 9 mg/dL (ref 8.9–10.3)
GFR calc Af Amer: 7 mL/min — ABNORMAL LOW (ref >60)
GFR calc non Af Amer: 6 mL/min — ABNORMAL LOW (ref >60)
GLUCOSE: 100 mg/dL — AB (ref 65–99)
POTASSIUM: 4.8 mmol/L (ref 3.5–5.1)
Sodium: 135 mmol/L (ref 135–145)

## 2016-11-28 LAB — CBC WITH DIFFERENTIAL/PLATELET
Basophils Absolute: 0 10*3/uL (ref 0.0–0.1)
Basophils Relative: 0 %
EOS PCT: 0 %
Eosinophils Absolute: 0 10*3/uL (ref 0.0–0.7)
HCT: 31.5 % — ABNORMAL LOW (ref 36.0–46.0)
Hemoglobin: 10.5 g/dL — ABNORMAL LOW (ref 12.0–15.0)
LYMPHS ABS: 0.8 10*3/uL (ref 0.7–4.0)
LYMPHS PCT: 8 %
MCH: 29.7 pg (ref 26.0–34.0)
MCHC: 33.3 g/dL (ref 30.0–36.0)
MCV: 89 fL (ref 78.0–100.0)
MONOS PCT: 12 %
Monocytes Absolute: 1.2 10*3/uL — ABNORMAL HIGH (ref 0.1–1.0)
Neutro Abs: 8.6 10*3/uL — ABNORMAL HIGH (ref 1.7–7.7)
Neutrophils Relative %: 81 %
PLATELETS: 191 10*3/uL (ref 150–400)
RBC: 3.54 MIL/uL — AB (ref 3.87–5.11)
RDW: 20.6 % — ABNORMAL HIGH (ref 11.5–15.5)
WBC: 10.7 10*3/uL — AB (ref 4.0–10.5)

## 2016-11-28 LAB — HEPATIC FUNCTION PANEL
ALK PHOS: 583 U/L — AB (ref 38–126)
ALT: 65 U/L — AB (ref 14–54)
AST: 105 U/L — ABNORMAL HIGH (ref 15–41)
Albumin: 1.8 g/dL — ABNORMAL LOW (ref 3.5–5.0)
BILIRUBIN INDIRECT: 6.8 mg/dL — AB (ref 0.3–0.9)
BILIRUBIN TOTAL: 20.6 mg/dL — AB (ref 0.3–1.2)
Bilirubin, Direct: 13.8 mg/dL — ABNORMAL HIGH (ref 0.1–0.5)
Total Protein: 6.3 g/dL — ABNORMAL LOW (ref 6.5–8.1)

## 2016-11-28 MED ORDER — LACTATED RINGERS IV BOLUS (SEPSIS)
2000.0000 mL | Freq: Once | INTRAVENOUS | Status: AC
Start: 2016-11-28 — End: 2016-11-28
  Administered 2016-11-28: 2000 mL via INTRAVENOUS

## 2016-11-28 NOTE — Consult Note (Signed)
Consultation Note Date: 11/28/2016   Patient Name: Michelle Erickson  DOB: 1955-07-14  MRN: [id number]  Age / Sex: 61 y.o., female  PCP: Dione Housekeeper, MD Referring Physician: No att. providers found  Reason for Consultation: Establishing goals of care, Inpatient hospice referral and Psychosocial/spiritual support  HPI/Patient Profile: 61 y.o. 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   Clinical Assessment and Goals of Care: Mrs. Petrov is lying quietly on the stretcher.  She will not make eye contact.  She is oriented to person only.  Confused.  I have a family meeting with husband Charlotte Crumb, and daughter Teresa Pelton, son Charlotte Crumb Junior is on the phone.  Present also is ED case manager, Luz Lex RN.  We talked about Mrs. Meckel's incurable health concerns.  We talked about their options including home with hospice versus hospice home in detail.  Although daughter wants to take her mother home, Mr. Heyden endorses that he feels they cannot provide adequate care.  After much discussion, Mr. Patchen endorses hospice home for comfort and dignity at end of life.  We talked about what is and is not provided at hospice.  Health care power of attorney NEXT OF KIN -spouse Najai Waszak   SUMMARY OF RECOMMENDATIONS   Comfort measures, requesting hospice home of [place] Planning:  DNR we discussed the concept of let nature take its course, allowing natural death.  Symptom Management:   Per hospice protocol.  Palliative Prophylaxis:   Frequent Pain Assessment and Turn Reposition  Additional Recommendations (Limitations, Scope, Preferences):  Full Comfort Care  Psycho-social/Spiritual:   Desire for further Chaplaincy support:no  Additional  Recommendations: Caregiving  Support/Resources and Education on Hospice  Prognosis:   < 2 weeks, based on frailty, hepatorenal syndrome, albumin of 1.8, family's desire to focus on comfort and dignity at end of life.  Discharge Planning: Family is requesting transition to comfort measures, hospice home [facility].      Primary Diagnoses: Present on Admission: **None**   I have reviewed the medical record, interviewed the patient and family, and examined the patient. The following aspects are pertinent.  Past Medical History:  Diagnosis Date  . Back pain   . Diabetes mellitus without complication ([place])   . Family history of colon cancer   . Hyperlipemia   . Hypertension   . Metastatic colon cancer to liver ([place]) 07/21/2014  . Snoring   . Wears glasses    contacts/glasses   Social History   Socioeconomic History  . Marital status: Married    Spouse name: Charlotte Crumb  . Number of children: 2  . Years of education: None  . Highest education level: None  Social Needs  . Financial resource strain: None  . Food insecurity - worry: None  . Food insecurity - inability: None  . Transportation needs - medical: None  . Transportation needs - non-medical: None  Occupational History  . None  Tobacco Use  . Smoking status: Former Smoker    Packs/day: 1.00    Years: 6.00    Pack years: 6.00    Types: Cigarettes    Last attempt to quit: 01/21/1976    Years since quitting: 40.8  . Smokeless tobacco: Never Used  Substance and Sexual Activity  . Alcohol use: No  . Drug use: No  . Sexual activity: Yes    Birth control/protection: Post-menopausal    Comment: # 2 pregnancies and #2 live births  Other Topics Concern  . None  Social History Narrative   Married, husband [name]   Has #2 adult children   Previously worked at [employer] History  Problem Relation Age of Onset  . COPD Father   . Heart attack Father   . Arthritis Sister   . Diabetes Sister   .  Colon cancer Sister        dx over 66  . Colon polyps Sister   . Seizures Brother   . Colon cancer Sister        dx in her early 12s, died in her early to mid 76s  . Diabetes Mother    Scheduled Meds: Continuous Infusions: PRN Meds:. Medications Prior to Admission:  Prior to Admission medications   Medication Sig Start Date End Date Taking? Authorizing Provider  amLODipine (NORVASC) 10 MG tablet Take 5 mg by mouth every morning.    Yes [provider]  atorvastatin (LIPITOR) 40 MG tablet Take 40 mg by mouth every evening.   Yes [provider]  diphenhydrAMINE (BENADRYL) 25 mg capsule Take 25 mg by mouth every 6 (six) hours as needed for itching.   Yes [provider]  ferrous sulfate 325 (65 FE) MG tablet Take 325 mg by mouth daily with breakfast.   Yes [provider]  insulin lispro (HUMALOG) 100 UNIT/ML injection Inject 4-8 Units into the skin 2 (two) times daily as needed for high blood sugar (per sliding scale). Sliding scale    Yes [provider]  lidocaine-prilocaine (EMLA) cream Apply 1 application topically once.  08/08/14  Yes [provider]  loperamide (IMODIUM) 2 MG capsule Take 2 mg by mouth 3 (three) times daily as needed for diarrhea or loose stools.   Yes [provider]  ondansetron (ZOFRAN) 8 MG tablet Take 0.5-1 tablets (4-8 mg total) by mouth 2 (two) times daily. Take 1 hour prior to Lonsurf dose for nausea prophylaxis 09/17/16  Yes Truitt Merle, MD  ondansetron (ZOFRAN-ODT) 4 MG disintegrating tablet Take 1 tablet (4 mg total) once as needed by mouth for nausea. 11/26/16  Yes Rosita Fire, MD  traMADol (ULTRAM) 50 MG tablet Take 1 tablet (50 mg total) every 6 (six) hours as needed by mouth for moderate pain. 11/26/16  Yes Rosita Fire, MD  trifluridine-tipiracil (LONSURF) 20-8.19 MG tablet TAKE 2 TABLETS (40MG ) BY MOUTH TWICE DAILY,1 HOUR AFTER AM & PM MEALS ON DAYS 1-5, 8-12. REPEAT EVERY 28  DAYS 10/16/16  Yes Truitt Merle, MD   No Known Allergies Review of Systems  Physical Exam  Vital Signs: BP (!) 155/84   Pulse 88   Temp 98.3 F (36.8 C) (Oral)   Resp 18   Ht 5\' 4"  (1.626 m)   Wt 62.1 kg (137 lb)   SpO2 94%   BMI 23.52 kg/m          SpO2: SpO2: 94 % O2 Device:SpO2: 94 %  O2 Flow Rate: .   IO: Intake/output summary: No intake or output data in the 24 hours ending 11/28/16 1110  LBM:   Baseline Weight: Weight: 62.1 kg (137 lb) Most recent weight: Weight: 62.1 kg (137 lb)     Palliative Assessment/Data:   Flowsheet Rows     Most Recent Value  Intake Tab  Referral Department  -- [ED]  Unit at Time of Referral  ER  Palliative Care Primary Diagnosis  Cancer  Date Notified  11/28/16  Reason for referral  Clarify Goals of Care, Psychosocial or Spiritual support, Counsel Regarding Hospice, End of [place]  Date of Admission  11/28/16  Date first seen by Palliative Care  11/28/16  # of days Palliative referral response time  0 Day(s)  # of days IP prior to Palliative referral  0  Clinical Assessment  Palliative Performance Scale Score  20%  Pain Max last 24 hours  Not able to report  Pain Min Last 24 hours  Not able to report  Dyspnea Max Last 24 Hours  Not able to report  Dyspnea Min Last 24 hours  Not able to report  Psychosocial & Spiritual Assessment  Palliative Care Outcomes  Patient/Family meeting held?  Yes  Who was at the meeting?  husband and daughter      Time In: 0930 Time Out: 0940 Time Total: 70 min Greater than 50%  of this time was spent counseling and coordinating care related to the above assessment and plan.  Signed by: Drue Novel, NP   Please contact Palliative Medicine Team phone at [phone] for questions and concerns.  For individual provider: See Shea Evans

## 2016-11-28 NOTE — ED Notes (Addendum)
Pt can go to [facility] when EMS is called for transport.. Please call report to Sigmund Hazel [phone].

## 2016-11-28 NOTE — Telephone Encounter (Signed)
I call pt back, she has deteriorated rapidly in the past week.  She was evaluated in the [facility] emergency room today, and finally decision was made to go to hospice.  She was transferred to hospice of [facility] from ED. I spoke with patient on the phone, and supported her decision.  I also spoke with her daughter and husband, provided emotional support.  They know to call me if anything I can help her. They appreciated my care, call and support.   Truitt Merle  11/28/2016

## 2016-11-28 NOTE — ED Provider Notes (Signed)
The patient was seen and evaluated, she has failure to thrive, significant morbidities, will be going to hospice after family and social work and case management have all been able to coordinate their efforts.   Noemi Chapel, MD 11/28/16 [phone]

## 2016-11-28 NOTE — Care Management Note (Signed)
Case Management Note  Patient Details  Name: LEKITA KEREKES MRN: [id number] Date of Birth: [birth year]  Subjective/Objective:    Family, husband, daughter and son all agree on wanting Hospice home of [place].  [facility] they do have a bed.  Information sent over.  They contacted husband and received insurance approval.  Plan to send to [facility] per EMS.  Family updated on plan and very appreciative.  Dr. Sabra Heck and nurse Alyse Low updated on plan for dc.   Alyse Low calling report to Lenard Lance at [phone]               Action/Plan:   Expected Discharge Date:                  Expected Discharge Plan:     In-House Referral:     Discharge planning Services   [facility]  Post Acute Care Choice:    Choice offered to:     DME Arranged:    DME Agency:     HH Arranged:    HH Agency:     Status of Service:     If discussed at [employer], dates discussed:    Additional Comments:  Zury, Fazzino, RN 11/28/2016, 12:15 PM

## 2016-11-28 NOTE — ED Notes (Signed)
Dr. Kathrynn Humble spoke with Hospice will be here between 78 and 10 to speak with family per RN.

## 2016-11-28 NOTE — ED Notes (Signed)
Pt and husband talking to each other about her not yet signing her pink (MOST) form and the fact that they have never signed a paper for him to be her POA.

## 2016-11-28 NOTE — ED Provider Notes (Addendum)
[facility] EMERGENCY DEPARTMENT Provider Note   CSN: [id number] Arrival date & time: 11/28/16  0531     History   Chief Complaint Chief Complaint  Patient presents with  . Weakness    HPI Michelle Erickson is a 61 y.o. female.  HPI  LEVEL 5 CAVEAT FOR ALTERED MENTAL STATUS Patient with history of metastatic colon cancer to liver, diabetes comes in with chief complaint of weakness. Patient was discharged from the hospital 2 days ago.  According to family since patient was discharged to home she has declined, and they have been unable to take proper care of her. Patient is noted to be confused and jaundiced. Husband reports that he would want hospice care.  It appears that patient was seen by hospice team while in the hospital, but they preferred home health, and are not established with any hospice service at this moment.  Past Medical History:  Diagnosis Date  . Back pain   . Diabetes mellitus without complication ([place])   . Family history of colon cancer   . Hyperlipemia   . Hypertension   . Metastatic colon cancer to liver ([place]) 07/21/2014  . Snoring   . Wears glasses    contacts/glasses    Patient Active Problem List   Diagnosis Date Noted  . Palliative care by specialist   . Metastatic colon cancer in female [facility])   . Acute on chronic renal failure ([place]) 11/23/2016  . Jaundice 11/22/2016  . Hyperbilirubinemia 11/18/2016  . CKD stage 3 due to type 2 diabetes mellitus ([place]) 08/12/2016  . Acute lower UTI 08/12/2016  . Goals of care, counseling/discussion 01/31/2016  . Port catheter in place 05/16/2015  . Genetic testing 08/31/2014  . 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 DM (diabetes mellitus), type 2 ([place]) 08/11/2014  . Intractable nausea and vomiting 08/11/2014  . Family history of colon cancer   . Metastatic colon cancer to liver ([employer]) 07/21/2014    Past Surgical History:  Procedure Laterality Date  . CATARACT EXTRACTION Bilateral   . MOUTH SURGERY       OB History    No data available       Home Medications    Prior to Admission medications   Medication Sig Start Date End Date Taking? Authorizing Provider  amLODipine (NORVASC) 10 MG tablet Take 5 mg by mouth every morning.    Yes [provider]  atorvastatin (LIPITOR) 40 MG tablet Take 40 mg by mouth every evening.   Yes [provider]  diphenhydrAMINE (BENADRYL) 25 mg capsule Take 25 mg by mouth every 6 (six) hours as needed for itching.   Yes [provider]  ferrous sulfate 325 (65 FE) MG tablet Take 325 mg by mouth daily with breakfast.   Yes [provider]  insulin lispro (HUMALOG) 100 UNIT/ML injection Inject 4-8 Units into the skin 2 (two) times daily as needed for high blood sugar (per sliding scale). Sliding scale    Yes [provider]  lidocaine-prilocaine (EMLA) cream Apply 1 application topically once.  08/08/14  Yes [provider]  loperamide (IMODIUM) 2 MG capsule Take 2 mg by mouth 3 (three) times daily as needed for diarrhea or loose stools.   Yes [provider]  ondansetron (ZOFRAN) 8 MG tablet Take 0.5-1 tablets (4-8 mg total) by mouth 2 (two) times daily. Take 1 hour prior to [place] dose for nausea prophylaxis 09/17/16  Yes Truitt Merle, MD  ondansetron (ZOFRAN-ODT) 4 MG disintegrating tablet Take  1 tablet (4 mg total) once as needed by mouth for nausea. 11/26/16  Yes Rosita Fire, MD  traMADol (ULTRAM) 50 MG tablet Take 1 tablet (50 mg total) every 6 (six) hours as needed by mouth for moderate pain. 11/26/16  Yes Rosita Fire, MD  trifluridine-tipiracil (LONSURF) 20-8.19 MG tablet TAKE 2 TABLETS (40MG ) BY MOUTH TWICE DAILY,1 HOUR AFTER AM & PM MEALS ON DAYS 1-5, 8-12. REPEAT EVERY 28 DAYS 10/16/16  Yes Truitt Merle, MD    Family History Family History  Problem Relation Age of Onset  . COPD Father   . Heart attack Father   . Arthritis Sister   . Diabetes Sister   . Colon cancer Sister         dx over 27  . Colon polyps Sister   . Seizures Brother   . Colon cancer Sister        dx in her early 53s, died in her early to mid 46s  . Diabetes Mother     Social History Social History   Tobacco Use  . Smoking status: Former Smoker    Packs/day: 1.00    Years: 6.00    Pack years: 6.00    Types: Cigarettes    Last attempt to quit: 01/21/1976    Years since quitting: 40.8  . Smokeless tobacco: Never Used  Substance Use Topics  . Alcohol use: No  . Drug use: No     Allergies   Patient has no known allergies.   Review of Systems Review of Systems  Unable to perform ROS: Mental status change     Physical Exam Updated Vital Signs BP 131/71   Pulse 92   Temp 98.3 F (36.8 C) (Oral)   Resp 20   Ht 5\' 4"  (1.626 m)   Wt 62.1 kg (137 lb)   SpO2 97%   BMI 23.52 kg/m   Physical Exam  HENT:  Head: Atraumatic.  Eyes: Scleral icterus is present.  Cardiovascular: Normal rate.  Pulmonary/Chest: Effort normal.  Abdominal: Bowel sounds are normal.  Neurological:  CONFUSED  Skin: Skin is warm.  Nursing note and vitals reviewed.    ED Treatments / Results  Labs (all labs ordered are listed, but only abnormal results are displayed) Labs Reviewed  CBC WITH DIFFERENTIAL/PLATELET - Abnormal; Notable for the following components:      Result Value   WBC 10.7 (*)    RBC 3.54 (*)    Hemoglobin 10.5 (*)    HCT 31.5 (*)    RDW 20.6 (*)    Neutro Abs 8.6 (*)    Monocytes Absolute 1.2 (*)    All other components within normal limits  BASIC METABOLIC PANEL - Abnormal; Notable for the following components:   CO2 12 (*)    Glucose, Bld 100 (*)    BUN 101 (*)    Creatinine, Ser 6.26 (*)    GFR calc non Af Amer 6 (*)    GFR calc Af Amer 7 (*)    All other components within normal limits  HEPATIC FUNCTION PANEL    EKG  EKG Interpretation None       Radiology No results found.  Procedures Procedures (including critical care time)    Medications  Ordered in ED Medications  lactated ringers bolus 2,000 mL (2,000 mLs Intravenous New Bag/Given 11/28/16 0901)     Initial Impression / Assessment and Plan / ED Course  I have reviewed the triage vital signs and the nursing notes.  Pertinent labs & imaging results that were available during my care of the patient were reviewed by me and considered in my medical decision making (see chart for details).     Patient comes in with chief complaint of weakness.  Patient has advanced colon cancer with liver metastases.  I think patient has violently for her disease but family is coming to realization that she is nearing her end.  After being discharged from the hospital patient has declined, and family is now wanting new discussion on goals of care and leaning towards hospice.  I have consulted palliative medicine who will see patient. Basic labs ordered for now, and they revealed that patient has an acute renal failure.  1 L of IV fluid ordered.  I suspect that the altered mental status is widely from the uremia.  Hepatic encephalopathy is also possible, however LFTs have been added at this time..  Transferring care to incoming team. Patient's LFTs need to be followed, and further lab workup and intervention might be required after the goals of care discussion or patient might need simple discharge to hospice.  Final Clinical Impressions(s) / ED Diagnoses   Final diagnoses:  AKI (acute kidney injury) ([place])  Uremia  Altered mental status, unspecified altered mental status type  Failure to thrive in adult  Cancer, metastatic ([place])  Subacute liver failure with hepatic coma [facility])    ED Discharge Orders    None       Varney Biles, MD 11/28/16 [place], [place], MD 11/28/16 [phone]

## 2016-11-28 NOTE — ED Notes (Signed)
Report given to Bettye Boeck at [facility]. [facility] called [id number] for transport.

## 2016-11-28 NOTE — ED Triage Notes (Signed)
Pt arrived by EMS from home. Pt dx w/ liver cancer at [place]. Wanting hospice care.

## 2016-11-28 NOTE — Discharge Instructions (Signed)
You may return to the ER for any worsening symptoms if so needed.

## 2016-11-28 NOTE — ED Notes (Signed)
[facility] called to transport patient to [facility]

## 2016-11-28 NOTE — ED Notes (Signed)
CRITICAL VALUE ALERT  Critical Value:  Bilirubin 20.6  Date & Time Notied:  11/28/16 6979  Provider Notified:dr miler at 1003  Orders Received/Actions taken:

## 2016-12-15 ENCOUNTER — Other Ambulatory Visit: Payer: BLUE CROSS/BLUE SHIELD

## 2016-12-20 DEATH — deceased

## 2017-01-07 MED ORDER — DEXAMETHASONE SODIUM PHOSPHATE 10 MG/ML IJ SOLN
INTRAMUSCULAR | Status: AC
  Filled 2017-01-07: qty 1

## 2017-01-07 MED ORDER — PALONOSETRON HCL INJECTION 0.25 MG/5ML
INTRAVENOUS | Status: AC
  Filled 2017-01-07: qty 5

## 2017-12-16 ENCOUNTER — Other Ambulatory Visit: Payer: Self-pay | Admitting: Nurse Practitioner
